# Patient Record
Sex: Male | Born: 1960 | Race: Black or African American | Hispanic: No | Marital: Married | Smoking: Former smoker
Health system: Southern US, Community
[De-identification: ages and names within clinical notes are randomized; demographics above are authoritative.]

## PROBLEM LIST (undated history)

## (undated) DIAGNOSIS — I5043 Acute on chronic combined systolic (congestive) and diastolic (congestive) heart failure: Secondary | ICD-10-CM

## (undated) DIAGNOSIS — I483 Typical atrial flutter: Secondary | ICD-10-CM

## (undated) DIAGNOSIS — E785 Hyperlipidemia, unspecified: Secondary | ICD-10-CM

## (undated) DIAGNOSIS — I10 Essential (primary) hypertension: Secondary | ICD-10-CM

## (undated) DIAGNOSIS — I420 Dilated cardiomyopathy: Secondary | ICD-10-CM

## (undated) DIAGNOSIS — T7840XA Allergy, unspecified, initial encounter: Secondary | ICD-10-CM

## (undated) HISTORY — DX: Dilated cardiomyopathy: I42.0

## (undated) HISTORY — DX: Essential (primary) hypertension: I10

## (undated) HISTORY — DX: Typical atrial flutter: I48.3

## (undated) HISTORY — DX: Acute on chronic combined systolic (congestive) and diastolic (congestive) heart failure: I50.43

## (undated) HISTORY — DX: Allergy, unspecified, initial encounter: T78.40XA

---

## 2003-01-16 HISTORY — PX: THROAT SURGERY: SHX803

## 2007-01-16 HISTORY — PX: KNEE ARTHROSCOPY: SUR90

## 2014-02-10 ENCOUNTER — Ambulatory Visit: Payer: Self-pay | Admitting: Medical

## 2014-02-11 ENCOUNTER — Ambulatory Visit (HOSPITAL_BASED_OUTPATIENT_CLINIC_OR_DEPARTMENT_OTHER)
Admission: RE | Admit: 2014-02-11 | Discharge: 2014-02-11 | Disposition: A | Discharge status: Home / Self Care | Payer: BLUE CROSS/BLUE SHIELD | Source: Ambulatory Visit | Attending: Medical | Admitting: Medical

## 2014-02-11 ENCOUNTER — Other Ambulatory Visit: Payer: Self-pay | Admitting: Medical

## 2014-02-11 ENCOUNTER — Encounter: Payer: Self-pay | Admitting: Medical

## 2014-02-11 ENCOUNTER — Ambulatory Visit (INDEPENDENT_AMBULATORY_CARE_PROVIDER_SITE_OTHER): Payer: BLUE CROSS/BLUE SHIELD | Admitting: Medical

## 2014-02-11 VITALS — BP 139/88 | HR 79 | Temp 98.5°F | Ht 70.5 in | Wt 322.2 lb

## 2014-02-11 DIAGNOSIS — M25562 Pain in left knee: Secondary | ICD-10-CM | POA: Insufficient documentation

## 2014-02-11 DIAGNOSIS — IMO0001 Reserved for inherently not codable concepts without codable children: Secondary | ICD-10-CM | POA: Insufficient documentation

## 2014-02-11 DIAGNOSIS — Z720 Tobacco use: Secondary | ICD-10-CM

## 2014-02-11 DIAGNOSIS — F17200 Nicotine dependence, unspecified, uncomplicated: Secondary | ICD-10-CM | POA: Insufficient documentation

## 2014-02-11 DIAGNOSIS — R030 Elevated blood-pressure reading, without diagnosis of hypertension: Secondary | ICD-10-CM

## 2014-02-11 DIAGNOSIS — J302 Other seasonal allergic rhinitis: Secondary | ICD-10-CM

## 2014-02-11 HISTORY — DX: Other seasonal allergic rhinitis: J30.2

## 2014-02-11 HISTORY — DX: Pain in left knee: M25.562

## 2014-02-11 MED ORDER — TRAMADOL HCL 50 MG PO TABS
50.0000 mg | ORAL_TABLET | Freq: Four times a day (QID) | ORAL | Status: DC | PRN
Start: 2014-02-11 — End: 2015-02-16

## 2014-02-11 NOTE — Assessment & Plan Note (Signed)
Spring and takes otc meds.Marland Kitchen

## 2014-02-11 NOTE — Assessment & Plan Note (Signed)
For you smoking, I want you to think about wellbutrin med we discussed. I would like to see what your bp trend is before starting this medication.

## 2014-02-11 NOTE — Progress Notes (Signed)
Pre visit review using our clinic review tool, if applicable. No additional management support is needed unless otherwise documented below in the visit note. 

## 2014-02-11 NOTE — Progress Notes (Signed)
Subjective:    Patient ID: Drew [name], male    DOB: Dec 19, 1960, 54 y.o.   MRN: [id number]  HPI   I have reviewed pt PMH, PSH, FH, Social History and Surgical History  Allergies- seasonal. Spring is worse. otc meds only.  Lt knee pain- Hx of arthroscopy. With cold weather his left knee hurts worse. But baseline some pain daily.  Pt had polyps removed from larynx. In 2005.  Pt not aware of parents health hx. Appear heatlhy.  Postal service employee, smokes, some alcohol, Eats dinner one time a day(eats at 1pm), married 1 child.   Pt has not had physical for more than one year.   He wants to quit smoking. He quit before in the past 8-9 yrs ago. Stopped for a month and half. Pt used otc gum and nicotine inhalers.    Past Medical History  Diagnosis Date  . Allergy     History   Social History  . Marital Status: Married    Spouse Name: N/A    Number of Children: N/A  . Years of Education: N/A   Occupational History  . Not on file.   Social History Main Topics  . Smoking status: Current Every Day Smoker -- 1.00 packs/day for 20 years    Types: Cigarettes  . Smokeless tobacco: Never Used  . Alcohol Use: 0.0 oz/week    0 Not specified per week     Comment: 6 pack a week spread out over weekend  . Drug Use: No  . Sexual Activity: Yes   Other Topics Concern  . Not on file   Social History Narrative  . No narrative on file    Past Surgical History  Procedure Laterality Date  . Throat surgery  2005  . Knee arthroscopy  2009    History reviewed. No pertinent family history.  Allergies  Allergen Reactions  . Tetracyclines & Related     No current outpatient prescriptions on file prior to visit.   No current facility-administered medications on file prior to visit.    BP 139/88 mmHg  Pulse 79  Temp(Src) 98.5 F (36.9 C) (Oral)  Ht 5' 10.5" (1.791 m)  Wt 322 lb 3.2 oz (146.149 kg)  BMI 45.56 kg/m2  SpO2 94%        Review of Systems    Constitutional: Negative for fever, chills, diaphoresis, activity change and fatigue.  Respiratory: Negative for cough, chest tightness and shortness of breath.   Cardiovascular: Negative for chest pain, palpitations and leg swelling.  Gastrointestinal: Negative for nausea, vomiting and abdominal pain.  Musculoskeletal: Negative for neck pain and neck stiffness.       Lt knee pain.  Neurological: Negative for dizziness, tremors, seizures, syncope, facial asymmetry, speech difficulty, weakness, light-headedness, numbness and headaches.  Psychiatric/Behavioral: Negative for behavioral problems, confusion and agitation. The patient is not nervous/anxious.        Objective:   Physical Exam   General Mental Status- Alert. General Appearance- Not in acute distress.   Skin General: Color- Normal Color. Moisture- Normal Moisture.  Neck Carotid Arteries- Normal color. Moisture- Normal Moisture. No carotid bruits. No JVD.  Chest and Lung Exam Auscultation: Breath Sounds:-Normal.  Cardiovascular Auscultation:Rythm- Regular. Murmurs & Other Heart Sounds:Auscultation of the heart reveals- No Murmurs.  Abdomen Inspection:-Inspeection Normal. Palpation/Percussion:Note:No mass. Palpation and Percussion of the abdomen reveal- Non Tender, Non Distended + BS, no rebound or guarding.    Neurologic Cranial Nerve exam:- CN III-XII intact(No nystagmus), symmetric smile. Drift  Test:- No drift. Romberg Exam:- Negative.  Heal to Toe Gait exam:-Normal. Finger to Nose:- Normal/Intact Strength:- 5/5 equal and symmetric strength both upper and lower extremities.  Lt knee- on flexion and extension he has moderat crepitus.        Assessment & Plan:

## 2014-02-11 NOTE — Patient Instructions (Addendum)
I will get xray of your left knee today. Take tylenol for mild pain. I wil rx tramadol for more severe pain.  For your blood pressure. Follow dash diet. Recheck bp in 2 wks.  For you smoking, I want you to think about wellbutrin med we discussed. I would like to see what your bp trend is before starting this medication.  Follow up in 2 weeks or as needed.  DASH Eating Plan DASH stands for "Dietary Approaches to Stop Hypertension." The DASH eating plan is a healthy eating plan that has been shown to reduce high blood pressure (hypertension). Additional health benefits may include reducing the risk of type 2 diabetes mellitus, heart disease, and stroke. The DASH eating plan may also help with weight loss. WHAT DO I NEED TO KNOW ABOUT THE DASH EATING PLAN? For the DASH eating plan, you will follow these general guidelines:  Choose foods with a percent daily value for sodium of less than 5% (as listed on the food label).  Use salt-free seasonings or herbs instead of table salt or sea salt.  Check with your health care provider or pharmacist before using salt substitutes.  Eat lower-sodium products, often labeled as "lower sodium" or "no salt added."  Eat fresh foods.  Eat more vegetables, fruits, and low-fat dairy products.  Choose whole grains. Look for the word "whole" as the first word in the ingredient list.  Choose fish and skinless chicken or [place] more often than red meat. Limit fish, poultry, and meat to 6 oz (170 g) each day.  Limit sweets, desserts, sugars, and sugary drinks.  Choose heart-healthy fats.  Limit cheese to 1 oz (28 g) per day.  Eat more home-cooked food and less restaurant, buffet, and fast food.  Limit fried foods.  Cook foods using methods other than frying.  Limit canned vegetables. If you do use them, rinse them well to decrease the sodium.  When eating at a restaurant, ask that your food be prepared with less salt, or no salt if possible. WHAT  FOODS CAN I EAT? Seek help from a dietitian for individual calorie needs. Grains Whole grain or whole wheat bread. Brown rice. Whole grain or whole wheat pasta. Quinoa, bulgur, and whole grain cereals. Low-sodium cereals. Corn or whole wheat flour tortillas. Whole grain cornbread. Whole grain crackers. Low-sodium crackers. Vegetables Fresh or frozen vegetables (raw, steamed, roasted, or grilled). Low-sodium or reduced-sodium tomato and vegetable juices. Low-sodium or reduced-sodium tomato sauce and paste. Low-sodium or reduced-sodium canned vegetables.  Fruits All fresh, canned (in natural juice), or frozen fruits. Meat and Other Protein Products Ground beef (85% or leaner), grass-fed beef, or beef trimmed of fat. Skinless chicken or [place]. Ground chicken or [place]. Pork trimmed of fat. All fish and seafood. Eggs. Dried beans, peas, or lentils. Unsalted nuts and seeds. Unsalted canned beans. Dairy Low-fat dairy products, such as skim or 1% milk, 2% or reduced-fat cheeses, low-fat ricotta or cottage cheese, or plain low-fat yogurt. Low-sodium or reduced-sodium cheeses. Fats and Oils Tub margarines without trans fats. Light or reduced-fat mayonnaise and salad dressings (reduced sodium). Avocado. Safflower, olive, or canola oils. Natural peanut or almond butter. Other Unsalted popcorn and pretzels. The items listed above may not be a complete list of recommended foods or beverages. Contact your dietitian for more options. WHAT FOODS ARE NOT RECOMMENDED? Grains White bread. White pasta. White rice. Refined cornbread. Bagels and croissants. Crackers that contain trans fat. Vegetables Creamed or fried vegetables. Vegetables in a cheese sauce. Regular  canned vegetables. Regular canned tomato sauce and paste. Regular tomato and vegetable juices. Fruits Dried fruits. Canned fruit in light or heavy syrup. Fruit juice. Meat and Other Protein Products Fatty cuts of meat. Ribs, chicken wings, bacon,  sausage, bologna, salami, chitterlings, fatback, hot dogs, bratwurst, and packaged luncheon meats. Salted nuts and seeds. Canned beans with salt. Dairy Whole or 2% milk, cream, half-and-half, and cream cheese. Whole-fat or sweetened yogurt. Full-fat cheeses or blue cheese. Nondairy creamers and whipped toppings. Processed cheese, cheese spreads, or cheese curds. Condiments Onion and garlic salt, seasoned salt, table salt, and sea salt. Canned and packaged gravies. Worcestershire sauce. Tartar sauce. Barbecue sauce. Teriyaki sauce. Soy sauce, including reduced sodium. Steak sauce. Fish sauce. Oyster sauce. Cocktail sauce. Horseradish. Ketchup and mustard. Meat flavorings and tenderizers. Bouillon cubes. Hot sauce. Tabasco sauce. Marinades. Taco seasonings. Relishes. Fats and Oils Butter, stick margarine, lard, shortening, ghee, and bacon fat. Coconut, palm kernel, or palm oils. Regular salad dressings. Other Pickles and olives. Salted popcorn and pretzels. The items listed above may not be a complete list of foods and beverages to avoid. Contact your dietitian for more information. WHERE CAN I FIND MORE INFORMATION? National Heart, [employer], and [employer] Institute: [employer] Document Released: 12/21/2010 Document Revised: 05/18/2013 Document Reviewed: 11/05/2012 [facility] Patient Information 2015 [place], [place]. This information is not intended to replace advice given to you by your health care provider. Make sure you discuss any questions you have with your health care provider.

## 2014-02-11 NOTE — Assessment & Plan Note (Signed)
I will get xray of your left knee today. Take tylenol for mild pain. I wil rx tramadol for more severe pain

## 2014-02-11 NOTE — Assessment & Plan Note (Signed)
For your blood pressure. Follow dash diet. Recheck bp in 2 wks.

## 2014-02-15 ENCOUNTER — Telehealth: Payer: Self-pay | Admitting: Medical

## 2014-02-15 DIAGNOSIS — M25562 Pain in left knee: Secondary | ICD-10-CM

## 2014-02-15 NOTE — Telephone Encounter (Signed)
Caller name:Olover desire Relation to GM:[id number] Call back number:[phone] Pharmacy:  Reason for call: pt is returning your call, wife states to please call back. Pt is still in a lot of pain.

## 2014-02-17 MED ORDER — HYDROCODONE-ACETAMINOPHEN 5-325 MG PO TABS: 1.0000 | ORAL_TABLET | Freq: Four times a day (QID) | ORAL | Status: DC | PRN

## 2014-02-17 NOTE — Telephone Encounter (Signed)
Pt has some moderate to severe pain despite use of tramadol. So advised stopped tramadol and pick up hydrocodone. Will refer pt ortho.

## 2014-02-26 ENCOUNTER — Encounter: Payer: Self-pay | Admitting: Medical

## 2014-02-26 ENCOUNTER — Telehealth: Payer: Self-pay | Admitting: Medical

## 2014-02-26 ENCOUNTER — Ambulatory Visit (INDEPENDENT_AMBULATORY_CARE_PROVIDER_SITE_OTHER): Payer: BLUE CROSS/BLUE SHIELD | Admitting: Medical

## 2014-02-26 VITALS — BP 130/80 | HR 72 | Temp 98.7°F | Ht 70.5 in | Wt 317.0 lb

## 2014-02-26 DIAGNOSIS — Z0189 Encounter for other specified special examinations: Secondary | ICD-10-CM

## 2014-02-26 DIAGNOSIS — Z1211 Encounter for screening for malignant neoplasm of colon: Secondary | ICD-10-CM

## 2014-02-26 DIAGNOSIS — Z23 Encounter for immunization: Secondary | ICD-10-CM

## 2014-02-26 DIAGNOSIS — R82998 Other abnormal findings in urine: Secondary | ICD-10-CM

## 2014-02-26 DIAGNOSIS — Z0000 Encounter for general adult medical examination without abnormal findings: Secondary | ICD-10-CM

## 2014-02-26 DIAGNOSIS — N390 Urinary tract infection, site not specified: Secondary | ICD-10-CM

## 2014-02-26 HISTORY — DX: Encounter for general adult medical examination without abnormal findings: Z00.00

## 2014-02-26 LAB — COMPREHENSIVE METABOLIC PANEL
ALBUMIN: 4.2 g/dL (ref 3.5–5.2)
ALT: 13 U/L (ref 0–53)
AST: 12 U/L (ref 0–37)
Alkaline Phosphatase: 55 U/L (ref 39–117)
BUN: 10 mg/dL (ref 6–23)
CHLORIDE: 107 meq/L (ref 96–112)
CO2: 29 meq/L (ref 19–32)
Calcium: 9.2 mg/dL (ref 8.4–10.5)
Creatinine, Ser: 0.88 mg/dL (ref 0.40–1.50)
GFR: 117.21 mL/min (ref >60.00)
GLUCOSE: 98 mg/dL (ref 70–99)
POTASSIUM: 3.8 meq/L (ref 3.5–5.1)
Sodium: 141 mEq/L (ref 135–145)
TOTAL PROTEIN: 7.1 g/dL (ref 6.0–8.3)
Total Bilirubin: 0.8 mg/dL (ref 0.2–1.2)

## 2014-02-26 LAB — CBC WITH DIFFERENTIAL/PLATELET
Basophils Absolute: 0 10*3/uL (ref 0.0–0.1)
Basophils Relative: 0.5 % (ref 0.0–3.0)
EOS PCT: 4.4 % (ref 0.0–5.0)
Eosinophils Absolute: 0.2 10*3/uL (ref 0.0–0.7)
HEMATOCRIT: 42.9 % (ref 39.0–52.0)
Hemoglobin: 14.5 g/dL (ref 13.0–17.0)
LYMPHS ABS: 1.2 10*3/uL (ref 0.7–4.0)
Lymphocytes Relative: 21.9 % (ref 12.0–46.0)
MCHC: 33.8 g/dL (ref 30.0–36.0)
MCV: 92.8 fl (ref 78.0–100.0)
MONO ABS: 0.3 10*3/uL (ref 0.1–1.0)
Monocytes Relative: 5.6 % (ref 3.0–12.0)
Neutro Abs: 3.7 10*3/uL (ref 1.4–7.7)
Neutrophils Relative %: 67.6 % (ref 43.0–77.0)
Platelets: 218 10*3/uL (ref 150.0–400.0)
RBC: 4.63 Mil/uL (ref 4.22–5.81)
RDW: 14.1 % (ref 11.5–15.5)
WBC: 5.5 10*3/uL (ref 4.0–10.5)

## 2014-02-26 LAB — LIPID PANEL
CHOLESTEROL: 189 mg/dL (ref 0–200)
HDL: 42.3 mg/dL (ref >39.00)
LDL Cholesterol: 130 mg/dL — ABNORMAL HIGH (ref 0–99)
NonHDL: 146.7
Total CHOL/HDL Ratio: 4
Triglycerides: 84 mg/dL (ref 0.0–149.0)
VLDL: 16.8 mg/dL (ref 0.0–40.0)

## 2014-02-26 LAB — POCT URINALYSIS DIPSTICK
GLUCOSE UA: NEGATIVE
Ketones, UA: NEGATIVE
NITRITE UA: NEGATIVE
Protein, UA: 300
Spec Grav, UA: 1.025
Urobilinogen, UA: 0.2
pH, UA: 5

## 2014-02-26 LAB — TSH: TSH: 2.3 u[IU]/mL (ref 0.35–4.50)

## 2014-02-26 LAB — PSA: PSA: 0.31 ng/mL (ref 0.10–4.00)

## 2014-02-26 MED ORDER — HYDROCORTISONE ACETATE 25 MG RE SUPP: 25.0000 mg | Freq: Two times a day (BID) | RECTAL | Status: DC

## 2014-02-26 NOTE — Progress Notes (Signed)
Pre visit review using our clinic review tool, if applicable. No additional management support is needed unless otherwise documented below in the visit note. 

## 2014-02-26 NOTE — Assessment & Plan Note (Signed)
Tdap, fasting cbc, cmp, tsh, lipid and psa.

## 2014-02-26 NOTE — Patient Instructions (Addendum)
We gave you a tdap today. And you declined the flu vaccine. If you change you mind on the flu vaccine let us know.  I will refer you to GI today for screening colonosocpy.  Recommend to stop smoking.  Please get labs today. We will call you on the labs.  Follow up in 3-4 weeks or as needed.    Preventive Care for Adults A healthy lifestyle and preventive care can promote health and wellness. Preventive health guidelines for men include the following key practices:  A routine yearly physical is a good way to check with your health care provider about your health and preventative screening. It is a chance to share any concerns and updates on your health and to receive a thorough exam.  Visit your dentist for a routine exam and preventative care every 6 months. Brush your teeth twice a day and floss once a day. Good oral hygiene prevents tooth decay and gum disease.  The frequency of eye exams is based on your age, health, family medical history, use of contact lenses, and other factors. Follow your health care provider's recommendations for frequency of eye exams.  Eat a healthy diet. Foods such as vegetables, fruits, whole grains, low-fat dairy products, and lean protein foods contain the nutrients you need without too many calories. Decrease your intake of foods high in solid fats, added sugars, and salt. Eat the right amount of calories for you.Get information about a proper diet from your health care provider, if necessary.  Regular physical exercise is one of the most important things you can do for your health. Most adults should get at least 150 minutes of moderate-intensity exercise (any activity that increases your heart rate and causes you to sweat) each week. In addition, most adults need muscle-strengthening exercises on 2 or more days a week.  Maintain a healthy weight. The body mass index (BMI) is a screening tool to identify possible weight problems. It provides an estimate of  body fat based on height and weight. Your health care provider can find your BMI and can help you achieve or maintain a healthy weight.For adults 20 years and older:  A BMI below 18.5 is considered underweight.  A BMI of 18.5 to 24.9 is normal.  A BMI of 25 to 29.9 is considered overweight.  A BMI of 30 and above is considered obese.  Maintain normal blood lipids and cholesterol levels by exercising and minimizing your intake of saturated fat. Eat a balanced diet with plenty of fruit and vegetables. Blood tests for lipids and cholesterol should begin at age 18 and be repeated every 5 years. If your lipid or cholesterol levels are high, you are over 50, or you are at high risk for heart disease, you may need your cholesterol levels checked more frequently.Ongoing high lipid and cholesterol levels should be treated with medicines if diet and exercise are not working.  If you smoke, find out from your health care provider how to quit. If you do not use tobacco, do not start.  Lung cancer screening is recommended for adults aged 63-80 years who are at high risk for developing lung cancer because of a history of smoking. A yearly low-dose CT scan of the lungs is recommended for people who have at least a 30-pack-year history of smoking and are a current smoker or have quit within the past 15 years. A pack year of smoking is smoking an average of 1 pack of cigarettes a day for 1 year (for  example: 1 pack a day for 30 years or 2 packs a day for 15 years). Yearly screening should continue until the smoker has stopped smoking for at least 15 years. Yearly screening should be stopped for people who develop a health problem that would prevent them from having lung cancer treatment.  If you choose to drink alcohol, do not have more than 2 drinks per day. One drink is considered to be 12 ounces (355 mL) of beer, 5 ounces (148 mL) of wine, or 1.5 ounces (44 mL) of liquor.  Avoid use of street drugs. Do not  share needles with anyone. Ask for help if you need support or instructions about stopping the use of drugs.  High blood pressure causes heart disease and increases the risk of stroke. Your blood pressure should be checked at least every 1-2 years. Ongoing high blood pressure should be treated with medicines, if weight loss and exercise are not effective.  If you are 74-45 years old, ask your health care provider if you should take aspirin to prevent heart disease.  Diabetes screening involves taking a blood sample to check your fasting blood sugar level. This should be done once every 3 years, after age 42, if you are within normal weight and without risk factors for diabetes. Testing should be considered at a younger age or be carried out more frequently if you are overweight and have at least 1 risk factor for diabetes.  Colorectal cancer can be detected and often prevented. Most routine colorectal cancer screening begins at the age of 37 and continues through age 18. However, your health care provider may recommend screening at an earlier age if you have risk factors for colon cancer. On a yearly basis, your health care provider may provide home test kits to check for hidden blood in the stool. Use of a small camera at the end of a tube to directly examine the colon (sigmoidoscopy or colonoscopy) can detect the earliest forms of colorectal cancer. Talk to your health care provider about this at age 60, when routine screening begins. Direct exam of the colon should be repeated every 5-10 years through age 40, unless early forms of precancerous polyps or small growths are found.  People who are at an increased risk for hepatitis B should be screened for this virus. You are considered at high risk for hepatitis B if:  You were born in a country where hepatitis B occurs often. Talk with your health care provider about which countries are considered high risk.  Your parents were born in a high-risk  country and you have not received a shot to protect against hepatitis B (hepatitis B vaccine).  You have HIV or AIDS.  You use needles to inject street drugs.  You live with, or have sex with, someone who has hepatitis B.  You are a man who has sex with other men (MSM).  You get hemodialysis treatment.  You take certain medicines for conditions such as cancer, organ transplantation, and autoimmune conditions.  Hepatitis C blood testing is recommended for all people born from [birth year] through 1965 and any individual with known risks for hepatitis C.  Practice safe sex. Use condoms and avoid high-risk sexual practices to reduce the spread of sexually transmitted infections (STIs). STIs include gonorrhea, chlamydia, syphilis, trichomonas, herpes, HPV, and human immunodeficiency virus (HIV). Herpes, HIV, and HPV are viral illnesses that have no cure. They can result in disability, cancer, and death.  If you are at risk of  being infected with HIV, it is recommended that you take a prescription medicine daily to prevent HIV infection. This is called preexposure prophylaxis (PrEP). You are considered at risk if:  You are a man who has sex with other men (MSM) and have other risk factors.  You are a heterosexual man, are sexually active, and are at increased risk for HIV infection.  You take drugs by injection.  You are sexually active with a partner who has HIV.  Talk with your health care provider about whether you are at high risk of being infected with HIV. If you choose to begin PrEP, you should first be tested for HIV. You should then be tested every 3 months for as long as you are taking PrEP.  A one-time screening for abdominal aortic aneurysm (AAA) and surgical repair of large AAAs by ultrasound are recommended for men ages 71 to 45 years who are current or former smokers.  Healthy men should no longer receive prostate-specific antigen (PSA) blood tests as part of routine cancer  screening. Talk with your health care provider about prostate cancer screening.  Testicular cancer screening is not recommended for adult males who have no symptoms. Screening includes self-exam, a health care provider exam, and other screening tests. Consult with your health care provider about any symptoms you have or any concerns you have about testicular cancer.  Use sunscreen. Apply sunscreen liberally and repeatedly throughout the day. You should seek shade when your shadow is shorter than you. Protect yourself by wearing long sleeves, pants, a wide-brimmed hat, and sunglasses year round, whenever you are outdoors.  Once a month, do a whole-body skin exam, using a mirror to look at the skin on your back. Tell your health care provider about new moles, moles that have irregular borders, moles that are larger than a pencil eraser, or moles that have changed in shape or color.  Stay current with required vaccines (immunizations).  Influenza vaccine. All adults should be immunized every year.  Tetanus, diphtheria, and acellular pertussis (Td, Tdap) vaccine. An adult who has not previously received Tdap or who does not know his vaccine status should receive 1 dose of Tdap. This initial dose should be followed by tetanus and diphtheria toxoids (Td) booster doses every 10 years. Adults with an unknown or incomplete history of completing a 3-dose immunization series with Td-containing vaccines should begin or complete a primary immunization series including a Tdap dose. Adults should receive a Td booster every 10 years.  Varicella vaccine. An adult without evidence of immunity to varicella should receive 2 doses or a second dose if he has previously received 1 dose.  Human papillomavirus (HPV) vaccine. Males aged 65-21 years who have not received the vaccine previously should receive the 3-dose series. Males aged 22-26 years may be immunized. Immunization is recommended through the age of 56 years for  any male who has sex with males and did not get any or all doses earlier. Immunization is recommended for any person with an immunocompromised condition through the age of 30 years if he did not get any or all doses earlier. During the 3-dose series, the second dose should be obtained 4-8 weeks after the first dose. The third dose should be obtained 24 weeks after the first dose and 16 weeks after the second dose.  Zoster vaccine. One dose is recommended for adults aged 52 years or older unless certain conditions are present.  Measles, mumps, and rubella (MMR) vaccine. Adults born before [birth year] generally  are considered immune to measles and mumps. Adults born in [birth year] or later should have 1 or more doses of MMR vaccine unless there is a contraindication to the vaccine or there is laboratory evidence of immunity to each of the three diseases. A routine second dose of MMR vaccine should be obtained at least 28 days after the first dose for students attending postsecondary schools, health care workers, or international travelers. People who received inactivated measles vaccine or an unknown type of measles vaccine during 1963-1967 should receive 2 doses of MMR vaccine. People who received inactivated mumps vaccine or an unknown type of mumps vaccine before 1979 and are at high risk for mumps infection should consider immunization with 2 doses of MMR vaccine. Unvaccinated health care workers born before [birth year] who lack laboratory evidence of measles, mumps, or rubella immunity or laboratory confirmation of disease should consider measles and mumps immunization with 2 doses of MMR vaccine or rubella immunization with 1 dose of MMR vaccine.  Pneumococcal 13-valent conjugate (PCV13) vaccine. When indicated, a person who is uncertain of his immunization history and has no record of immunization should receive the PCV13 vaccine. An adult aged 65 years or older who has certain medical conditions and has not been previously  immunized should receive 1 dose of PCV13 vaccine. This PCV13 should be followed with a dose of pneumococcal polysaccharide (PPSV23) vaccine. The PPSV23 vaccine dose should be obtained at least 8 weeks after the dose of PCV13 vaccine. An adult aged 104 years or older who has certain medical conditions and previously received 1 or more doses of PPSV23 vaccine should receive 1 dose of PCV13. The PCV13 vaccine dose should be obtained 1 or more years after the last PPSV23 vaccine dose.  Pneumococcal polysaccharide (PPSV23) vaccine. When PCV13 is also indicated, PCV13 should be obtained first. All adults aged 21 years and older should be immunized. An adult younger than age 48 years who has certain medical conditions should be immunized. Any person who resides in a nursing home or long-term care facility should be immunized. An adult smoker should be immunized. People with an immunocompromised condition and certain other conditions should receive both PCV13 and PPSV23 vaccines. People with human immunodeficiency virus (HIV) infection should be immunized as soon as possible after diagnosis. Immunization during chemotherapy or radiation therapy should be avoided. Routine use of PPSV23 vaccine is not recommended for American Indians, [place] Natives, or people younger than 65 years unless there are medical conditions that require PPSV23 vaccine. When indicated, people who have unknown immunization and have no record of immunization should receive PPSV23 vaccine. One-time revaccination 5 years after the first dose of PPSV23 is recommended for people aged 19-64 years who have chronic kidney failure, nephrotic syndrome, asplenia, or immunocompromised conditions. People who received 1-2 doses of PPSV23 before age 6 years should receive another dose of PPSV23 vaccine at age 55 years or later if at least 5 years have passed since the previous dose. Doses of PPSV23 are not needed for people immunized with PPSV23 at or after age 71  years.  Meningococcal vaccine. Adults with asplenia or persistent complement component deficiencies should receive 2 doses of quadrivalent meningococcal conjugate (MenACWY-D) vaccine. The doses should be obtained at least 2 months apart. Microbiologists working with certain meningococcal bacteria, [employer] recruits, people at risk during an outbreak, and people who travel to or live in countries with a high rate of meningitis should be immunized. A first-year college student up through age 107 years who is  living in a residence hall should receive a dose if he did not receive a dose on or after his 16th birthday. Adults who have certain high-risk conditions should receive one or more doses of vaccine.  Hepatitis A vaccine. Adults who wish to be protected from this disease, have certain high-risk conditions, work with hepatitis A-infected animals, work in hepatitis A research labs, or travel to or work in countries with a high rate of hepatitis A should be immunized. Adults who were previously unvaccinated and who anticipate close contact with an international adoptee during the first 60 days after arrival in the [place] from a country with a high rate of hepatitis A should be immunized.  Hepatitis B vaccine. Adults should be immunized if they wish to be protected from this disease, have certain high-risk conditions, may be exposed to blood or other infectious body fluids, are household contacts or sex partners of hepatitis B positive people, are clients or workers in certain care facilities, or travel to or work in countries with a high rate of hepatitis B.  Haemophilus influenzae type b (Hib) vaccine. A previously unvaccinated person with asplenia or sickle cell disease or having a scheduled splenectomy should receive 1 dose of Hib vaccine. Regardless of previous immunization, a recipient of a hematopoietic stem cell transplant should receive a 3-dose series 6-12 months after his successful transplant.  Hib vaccine is not recommended for adults with HIV infection. Preventive Service / Frequency Ages 14 to 40  Blood pressure check.** / Every 1 to 2 years.  Lipid and cholesterol check.** / Every 5 years beginning at age 63.  Hepatitis C blood test.** / For any individual with known risks for hepatitis C.  Skin self-exam. / Monthly.  Influenza vaccine. / Every year.  Tetanus, diphtheria, and acellular pertussis (Tdap, Td) vaccine.** / Consult your health care provider. 1 dose of Td every 10 years.  Varicella vaccine.** / Consult your health care provider.  HPV vaccine. / 3 doses over 6 months, if 54 or younger.  Measles, mumps, rubella (MMR) vaccine.** / You need at least 1 dose of MMR if you were born in 1957 or later. You may also need a second dose.  Pneumococcal 13-valent conjugate (PCV13) vaccine.** / Consult your health care provider.  Pneumococcal polysaccharide (PPSV23) vaccine.** / 1 to 2 doses if you smoke cigarettes or if you have certain conditions.  Meningococcal vaccine.** / 1 dose if you are age 15 to 36 years and a Market researcher living in a residence hall, or have one of several medical conditions. You may also need additional booster doses.  Hepatitis A vaccine.** / Consult your health care provider.  Hepatitis B vaccine.** / Consult your health care provider.  Haemophilus influenzae type b (Hib) vaccine.** / Consult your health care provider. Ages 68 to 42  Blood pressure check.** / Every 1 to 2 years.  Lipid and cholesterol check.** / Every 5 years beginning at age 58.  Lung cancer screening. / Every year if you are aged 74-80 years and have a 30-pack-year history of smoking and currently smoke or have quit within the past 15 years. Yearly screening is stopped once you have quit smoking for at least 15 years or develop a health problem that would prevent you from having lung cancer treatment.  Fecal occult blood test (FOBT) of stool. / Every year  beginning at age 13 and continuing until age 65. You may not have to do this test if you get a colonoscopy  every 10 years.  Flexible sigmoidoscopy** or colonoscopy.** / Every 5 years for a flexible sigmoidoscopy or every 10 years for a colonoscopy beginning at age 66 and continuing until age 43.  Hepatitis C blood test.** / For all people born from [birth year] through 1965 and any individual with known risks for hepatitis C.  Skin self-exam. / Monthly.  Influenza vaccine. / Every year.  Tetanus, diphtheria, and acellular pertussis (Tdap/Td) vaccine.** / Consult your health care provider. 1 dose of Td every 10 years.  Varicella vaccine.** / Consult your health care provider.  Zoster vaccine.** / 1 dose for adults aged 46 years or older.  Measles, mumps, rubella (MMR) vaccine.** / You need at least 1 dose of MMR if you were born in 1955 or later. You may also need a second dose.  Pneumococcal 13-valent conjugate (PCV13) vaccine.** / Consult your health care provider.  Pneumococcal polysaccharide (PPSV23) vaccine.** / 1 to 2 doses if you smoke cigarettes or if you have certain conditions.  Meningococcal vaccine.** / Consult your health care provider.  Hepatitis A vaccine.** / Consult your health care provider.  Hepatitis B vaccine.** / Consult your health care provider.  Haemophilus influenzae type b (Hib) vaccine.** / Consult your health care provider. Ages 27 and over  Blood pressure check.** / Every 1 to 2 years.  Lipid and cholesterol check.**/ Every 5 years beginning at age 70.  Lung cancer screening. / Every year if you are aged 29-80 years and have a 30-pack-year history of smoking and currently smoke or have quit within the past 15 years. Yearly screening is stopped once you have quit smoking for at least 15 years or develop a health problem that would prevent you from having lung cancer treatment.  Fecal occult blood test (FOBT) of stool. / Every year beginning at age 78 and  continuing until age 48. You may not have to do this test if you get a colonoscopy every 10 years.  Flexible sigmoidoscopy** or colonoscopy.** / Every 5 years for a flexible sigmoidoscopy or every 10 years for a colonoscopy beginning at age 97 and continuing until age 42.  Hepatitis C blood test.** / For all people born from [birth year] through 1963 and any individual with known risks for hepatitis C.  Abdominal aortic aneurysm (AAA) screening.** / A one-time screening for ages 77 to 29 years who are current or former smokers.  Skin self-exam. / Monthly.  Influenza vaccine. / Every year.  Tetanus, diphtheria, and acellular pertussis (Tdap/Td) vaccine.** / 1 dose of Td every 10 years.  Varicella vaccine.** / Consult your health care provider.  Zoster vaccine.** / 1 dose for adults aged 54 years or older.  Pneumococcal 13-valent conjugate (PCV13) vaccine.** / Consult your health care provider.  Pneumococcal polysaccharide (PPSV23) vaccine.** / 1 dose for all adults aged 68 years and older.  Meningococcal vaccine.** / Consult your health care provider.  Hepatitis A vaccine.** / Consult your health care provider.  Hepatitis B vaccine.** / Consult your health care provider.  Haemophilus influenzae type b (Hib) vaccine.** / Consult your health care provider. **Family history and personal history of risk and conditions may change your health care provider's recommendations. Document Released: 02/27/2001 Document Revised: 01/06/2013 Document Reviewed: 05/29/2010 [facility] Patient Information 2015 [place], [place]. This information is not intended to replace advice given to you by your health care provider. Make sure you discuss any questions you have with your health care provider.

## 2014-02-26 NOTE — Telephone Encounter (Signed)
Annusol hc suppository. Will send to his pharmacy.

## 2014-02-26 NOTE — Progress Notes (Signed)
Subjective:    Patient ID: Drew [name], male    DOB: [birth year], 54 y.o.   MRN: [id number]  HPI   Pt in for physical exam.  I have reviewed pt PMH, PSH, FH, Social History and Surgical History  Pt eats one time a day(moderate healthy), No exercise, current smoker, married- 6 children.  Pt never had colonosocpy. No fh for colon cancer.  Pt declines flu vaccine. Decline pneumonia vaccine.  Pt states no recent tetanus done for years. Pt willing to do tdap.     Review of Systems  Constitutional: Negative for fever, chills and fatigue.  HENT: Negative for congestion, drooling, facial swelling, hearing loss, postnasal drip, sinus pressure, sneezing and sore throat.   Respiratory: Negative for cough, choking, chest tightness, shortness of breath and wheezing.   Cardiovascular: Negative for chest pain and palpitations.  Gastrointestinal: Negative for abdominal pain, diarrhea and constipation.       3 days of itchng burning flared hemorrhoid.  Genitourinary: Negative for dysuria, hematuria, flank pain, scrotal swelling, enuresis and testicular pain.  Musculoskeletal: Negative for myalgias and back pain.  Neurological: Negative for dizziness, syncope, facial asymmetry, speech difficulty, weakness, numbness and headaches.  Psychiatric/Behavioral: Negative for behavioral problems and confusion.     Past Medical History  Diagnosis Date  . Allergy     History   Social History  . Marital Status: Married    Spouse Name: N/A  . Number of Children: N/A  . Years of Education: N/A   Occupational History  . Not on file.   Social History Main Topics  . Smoking status: Current Every Day Smoker -- 1.00 packs/day for 20 years    Types: Cigarettes  . Smokeless tobacco: Never Used  . Alcohol Use: 0.0 oz/week    0 Standard drinks or equivalent per week     Comment: 6 pack a week spread out over weekend  . Drug Use: No  . Sexual Activity: Yes   Other Topics Concern  . Not on  file   Social History Narrative    Past Surgical History  Procedure Laterality Date  . Throat surgery  2005  . Knee arthroscopy  2009    No family history on file.  Allergies  Allergen Reactions  . Tetracyclines & Related     Current Outpatient Prescriptions on File Prior to Visit  Medication Sig Dispense Refill  . HYDROcodone-acetaminophen (NORCO) 5-325 MG per tablet Take 1 tablet by mouth every 6 (six) hours as needed for moderate pain. 30 tablet 0  . traMADol (ULTRAM) 50 MG tablet Take 1 tablet (50 mg total) by mouth every 6 (six) hours as needed. 30 tablet 0   No current facility-administered medications on file prior to visit.    BP 130/80 mmHg  Pulse 72  Temp(Src) 98.7 F (37.1 C) (Oral)  Ht 5' 10.5" (1.791 m)  Wt 317 lb (143.79 kg)  BMI 44.83 kg/m2  SpO2 97%      Objective:   Physical Exam  General Mental Status- Alert. Orientation- Oriented x3.  Build and Nutrition- Well nourished and Well Developed.  Skin General:-Normal. Color- Normal color. Moisture- Normal. Temperature-Warm.  HEENT  Ears- Normal. Auditory Canal- Bilateral-Normal. Tympanic Membrane- Bilateral-Normal. Eye Fundi-Bilateral-Normal. Pupil- bilateral- Direct reaction to light normal. Nose & Sinuses- Normal. Nostrils-Bilateral- Normal. Mouth & Throat-Normal.  Neck Neck- No Bruits or Masses. Trachea midline.  Thyroid- Normal.  Chest and Lung Exam Percussion: Quality and Intensity-Percussion normal. Percussion of the chest reveals- No Dullness.  Palpation:  Palpation of the chest reveals- Non-tender- No dullness. Auscultation: Breath Sounds- Normal.  Adventitous Sounds:-No adventitious sounds.  Cardiovascular Inspection:- No Heaves. Auscultation:-Normal sinus rhythm without murmur gallop, S1 WNL and S2 WNL.  Abdomen Inspection:-Inspection Normal. Inspection of the abdomen reveals- No hernias Palpation/Percussion:- Palpation and Percussion of the Abdomen reveal- Non Tender and  No Palpable abdominal masses. Liver: Other Characteristics- No hepatomegaly. Spleen:Other Characteristics- No Splenomegaly. Auscultation:- Auscultation of the abdomen reveals- Bowel sounds normal and No Abdominal bruits.  Male Genitourinary Urethra:- No discharge. Penis- Circumcised. Scrotum- No masses. Testes- Bilateral-Normal.  Rectal Anorectal Exam: Performed- Normal sphincter tone. No masses noted. Prostate smooth normal size. Has large hemorrhoid 3 oclock. About 2 cm x 1 cm.  Peripheral  Vascular Lower Extremity:Inspection- Bilateral-Inspection Normal  Palpation: Femoral pulse- Bilateral- 2+. Popliteal pulse- Bilateral-2+. Dorsalis pedis pulse- Bilateral- 1/2+. Edema- Bilateral- No edema.  Neurologic Mental Status:- Normal. Cranial Nerves:-Normal Bilaterally. Motor:-Normal. Strength:5/5 normal muscle strength-All Muscles. General Assessment of Reflexes: Right Knee-2+. Left Knee- 2+. Coordination-Normal. Gait- Normal.  Meningeal Signs- None.  Musculoskeletal Global Assessment General-Joints show full range of motion without obvious deformity and Normal muscle mass. Strength in upper and lower extremities.  Lymphatics General lymphatics Description- No generalized lymphadenopathy.      Assessment & Plan:

## 2014-02-27 LAB — URINE CULTURE: Colony Count: 70000

## 2014-03-01 ENCOUNTER — Telehealth: Payer: Self-pay | Admitting: Medical

## 2014-03-01 NOTE — Telephone Encounter (Signed)
emmi mailed  °

## 2014-03-02 ENCOUNTER — Other Ambulatory Visit: Payer: Self-pay | Admitting: Medical

## 2014-03-02 MED ORDER — AMOXICILLIN 500 MG PO CAPS: 500.0000 mg | ORAL_CAPSULE | Freq: Three times a day (TID) | ORAL | Status: DC

## 2015-02-02 ENCOUNTER — Encounter: Payer: Self-pay | Admitting: Medical

## 2015-02-02 ENCOUNTER — Ambulatory Visit (INDEPENDENT_AMBULATORY_CARE_PROVIDER_SITE_OTHER): Payer: Federal, State, Local not specified - PPO | Admitting: Medical

## 2015-02-02 ENCOUNTER — Other Ambulatory Visit (HOSPITAL_COMMUNITY)
Admission: RE | Admit: 2015-02-02 | Discharge: 2015-02-02 | Disposition: A | Discharge status: Home / Self Care | Payer: Federal, State, Local not specified - PPO | Source: Ambulatory Visit | Attending: Medical | Admitting: Medical

## 2015-02-02 VITALS — BP 128/84 | HR 78 | Temp 98.0°F | Ht 70.5 in | Wt 318.0 lb

## 2015-02-02 DIAGNOSIS — Z113 Encounter for screening for infections with a predominantly sexual mode of transmission: Secondary | ICD-10-CM | POA: Diagnosis present

## 2015-02-02 DIAGNOSIS — Z202 Contact with and (suspected) exposure to infections with a predominantly sexual mode of transmission: Secondary | ICD-10-CM | POA: Diagnosis not present

## 2015-02-02 LAB — RPR

## 2015-02-02 LAB — HIV ANTIBODY (ROUTINE TESTING W REFLEX): HIV 1&2 Ab, 4th Generation: NONREACTIVE

## 2015-02-02 MED ORDER — METRONIDAZOLE 500 MG PO TABS: 500.0000 mg | ORAL_TABLET | Freq: Three times a day (TID) | ORAL | Status: DC

## 2015-02-02 MED FILL — metroNIDAZOLE 500 MG TABS: 500 | 10 days supply | Qty: 30 | Fill #0

## 2015-02-02 NOTE — Progress Notes (Signed)
Pre visit review using our clinic review tool, if applicable. No additional management support is needed unless otherwise documented below in the visit note. 

## 2015-02-02 NOTE — Progress Notes (Signed)
   Subjective:    Patient ID: Drew [name], male    DOB: Aug 10, 1960, 55 y.o.   MRN: [id number]  HPI   Pt in for std evaluation.  Pt states his wife diagnosed with trichomonas. Pt admits he is likely the cause(some relations outside marriage when out of town). No testicle pain. No discharge from penis. No skin lesions to penis.    Review of Systems  Constitutional: Negative for fever, chills and fatigue.  Respiratory: Negative for cough, chest tightness, shortness of breath and wheezing.   Cardiovascular: Negative for chest pain and palpitations.  Genitourinary: Negative for dysuria, urgency, frequency, discharge, penile pain and testicular pain.  Musculoskeletal: Negative for back pain.  Neurological: Negative for dizziness, numbness and headaches.  Hematological: Negative for adenopathy. Does not bruise/bleed easily.  Psychiatric/Behavioral: Negative for behavioral problems and confusion.    Past Medical History  Diagnosis Date  . Allergy     Social History   Social History  . Marital Status: Married    Spouse Name: N/A  . Number of Children: N/A  . Years of Education: N/A   Occupational History  . Not on file.   Social History Main Topics  . Smoking status: Current Every Day Smoker -- 1.00 packs/day for 20 years    Types: Cigarettes  . Smokeless tobacco: Never Used  . Alcohol Use: 0.0 oz/week    0 Standard drinks or equivalent per week     Comment: 6 pack a week spread out over weekend  . Drug Use: No  . Sexual Activity: Yes   Other Topics Concern  . Not on file   Social History Narrative    Past Surgical History  Procedure Laterality Date  . Throat surgery  2005  . Knee arthroscopy  2009    No family history on file.  Allergies  Allergen Reactions  . Tetracyclines & Related     Current Outpatient Prescriptions on File Prior to Visit  Medication Sig Dispense Refill  . traMADol (ULTRAM) 50 MG tablet Take 1 tablet (50 mg total) by mouth every 6  (six) hours as needed. (Patient not taking: Reported on 02/02/2015) 30 tablet 0   No current facility-administered medications on file prior to visit.    BP 128/84 mmHg  Pulse 78  Temp(Src) 98 F (36.7 C) (Oral)  Ht 5' 10.5" (1.791 m)  Wt 318 lb (144.244 kg)  BMI 44.97 kg/m2  SpO2 97%       Objective:   Physical Exam  General- No acute distress. Pleasant patient. Neck- Full range of motion, no jvd Lungs- Clear, even and unlabored. Heart- regular rate and rhythm. Neurologic- CNII- XII grossly intact.  Genital- exam differred.       Assessment & Plan:  For trichomonas exposure Will prescribe flagyl today.  Will also get urine ancillary std test. Get screening  lab work(hiv and rpr)  Follow as needed depending on lab review.

## 2015-02-02 NOTE — Patient Instructions (Addendum)
For trichomonas exposure Will prescribe flagyl today.  Will also get urine ancillary std test. Get screening  lab work(hiv and rpr)  Follow as needed depending on lab review.

## 2015-02-03 LAB — URINE CYTOLOGY ANCILLARY ONLY
CHLAMYDIA, DNA PROBE: NEGATIVE
Neisseria Gonorrhea: NEGATIVE
Trichomonas: POSITIVE — AB

## 2015-02-16 ENCOUNTER — Other Ambulatory Visit: Payer: Self-pay

## 2015-02-16 DIAGNOSIS — M25562 Pain in left knee: Secondary | ICD-10-CM

## 2015-02-16 MED ORDER — TRAMADOL HCL 50 MG PO TABS: 50.0000 mg | ORAL_TABLET | Freq: Four times a day (QID) | ORAL | Status: DC | PRN

## 2015-02-17 ENCOUNTER — Telehealth: Payer: Self-pay | Admitting: Medical

## 2015-02-17 NOTE — Telephone Encounter (Signed)
LM for pt to call and schedule flu shot or update records. °

## 2015-03-11 ENCOUNTER — Encounter: Payer: Self-pay | Admitting: Medical

## 2015-03-11 ENCOUNTER — Ambulatory Visit (INDEPENDENT_AMBULATORY_CARE_PROVIDER_SITE_OTHER): Payer: Federal, State, Local not specified - PPO | Admitting: Medical

## 2015-03-11 VITALS — BP 126/82 | HR 77 | Temp 98.1°F | Ht 70.5 in | Wt 310.6 lb

## 2015-03-11 DIAGNOSIS — K529 Noninfective gastroenteritis and colitis, unspecified: Secondary | ICD-10-CM | POA: Diagnosis not present

## 2015-03-11 DIAGNOSIS — R5383 Other fatigue: Secondary | ICD-10-CM

## 2015-03-11 LAB — CBC WITH DIFFERENTIAL/PLATELET
BASOS PCT: 0 % (ref 0–1)
Basophils Absolute: 0 10*3/uL (ref 0.0–0.1)
EOS ABS: 0.2 10*3/uL (ref 0.0–0.7)
EOS PCT: 5 % (ref 0–5)
HCT: 47.3 % (ref 39.0–52.0)
Hemoglobin: 16.1 g/dL (ref 13.0–17.0)
Lymphocytes Relative: 50 % — ABNORMAL HIGH (ref 12–46)
Lymphs Abs: 2.3 10*3/uL (ref 0.7–4.0)
MCH: 30.8 pg (ref 26.0–34.0)
MCHC: 34 g/dL (ref 30.0–36.0)
MCV: 90.4 fL (ref 78.0–100.0)
MONO ABS: 0.5 10*3/uL (ref 0.1–1.0)
MONOS PCT: 12 % (ref 3–12)
MPV: 9.8 fL (ref 8.6–12.4)
Neutro Abs: 1.5 10*3/uL — ABNORMAL LOW (ref 1.7–7.7)
Neutrophils Relative %: 33 % — ABNORMAL LOW (ref 43–77)
PLATELETS: 211 10*3/uL (ref 150–400)
RBC: 5.23 MIL/uL (ref 4.22–5.81)
RDW: 13.6 % (ref 11.5–15.5)
WBC: 4.5 10*3/uL (ref 4.0–10.5)

## 2015-03-11 LAB — COMPREHENSIVE METABOLIC PANEL
ALBUMIN: 4 g/dL (ref 3.6–5.1)
ALT: 20 U/L (ref 9–46)
AST: 19 U/L (ref 10–35)
Alkaline Phosphatase: 51 U/L (ref 40–115)
BUN: 9 mg/dL (ref 7–25)
CHLORIDE: 105 mmol/L (ref 98–110)
CO2: 21 mmol/L (ref 20–31)
CREATININE: 0.87 mg/dL (ref 0.70–1.33)
Calcium: 8.8 mg/dL (ref 8.6–10.3)
GLUCOSE: 92 mg/dL (ref 65–99)
Potassium: 4 mmol/L (ref 3.5–5.3)
SODIUM: 138 mmol/L (ref 135–146)
Total Bilirubin: 0.3 mg/dL (ref 0.2–1.2)
Total Protein: 6.9 g/dL (ref 6.1–8.1)

## 2015-03-11 MED ORDER — ONDANSETRON 8 MG PO TBDP: 8.0000 mg | ORAL_TABLET | Freq: Three times a day (TID) | ORAL | Status: DC | PRN

## 2015-03-11 NOTE — Patient Instructions (Addendum)
Likely viral gastroenteritis. You are improving.  Bland diet as described and hydrate with [employer] water.  If  Nausea or vomiting returns then start zofran.  If loose stools reoccur then immodium ad.   Described extreme fatigue recently with above. Will get cbc and cmp.  If diarrhea restarts as before over weekend then turn in stool panel on Monday.  Follow up 7 days persisting symptoms or as needed

## 2015-03-11 NOTE — Progress Notes (Signed)
Subjective:    Patient ID: Drew [name], male    DOB: 04-16-1960, 55 y.o.   MRN: [id number]  HPI  Pt in states recently he had upset stomach, diarrhea and vomiting. This started on Monday. Pt states his wife and mother in law had same symptoms. Day before they got sick his grandaughter had upset stomach.   Pt states that onset of illness felt fatigue. He had some fever all week up until yesterda. Pt had some faint body aches slight last night but not now.  Pt states since Wednesday he feels like he has been improving.   Pt states on Tuesday about 10 loose stools. And may have vomited about 10 times. But he describes more dry heaving.  Pt wife and mother in law feel better.   Pt had 2 loose stools yesterday. No vomiting.     Review of Systems  Constitutional: Positive for fatigue. Negative for fever and chills.  HENT: Negative for congestion.   Respiratory: Negative for cough, shortness of breath and wheezing.   Cardiovascular: Negative for chest pain and palpitations.  Gastrointestinal: Negative for nausea, vomiting, abdominal pain, diarrhea, constipation and abdominal distention.       See hpi.  Musculoskeletal: Negative for myalgias.  Skin: Negative for rash.  Neurological: Negative for dizziness and headaches.  Hematological: Negative for adenopathy. Does not bruise/bleed easily.   Past Medical History  Diagnosis Date  . Allergy     Social History   Social History  . Marital Status: Married    Spouse Name: N/A  . Number of Children: N/A  . Years of Education: N/A   Occupational History  . Not on file.   Social History Main Topics  . Smoking status: Current Every Day Smoker -- 1.00 packs/day for 20 years    Types: Cigarettes  . Smokeless tobacco: Never Used  . Alcohol Use: 0.0 oz/week    0 Standard drinks or equivalent per week     Comment: 6 pack a week spread out over weekend  . Drug Use: No  . Sexual Activity: Yes   Other Topics Concern  . Not on  file   Social History Narrative    Past Surgical History  Procedure Laterality Date  . Throat surgery  2005  . Knee arthroscopy  2009    No family history on file.  Allergies  Allergen Reactions  . Tetracyclines & Related     Current Outpatient Prescriptions on File Prior to Visit  Medication Sig Dispense Refill  . traMADol (ULTRAM) 50 MG tablet Take 1 tablet (50 mg total) by mouth every 6 (six) hours as needed. 30 tablet 0   No current facility-administered medications on file prior to visit.    BP 126/82 mmHg  Pulse 77  Temp(Src) 98.1 F (36.7 C) (Oral)  Ht 5' 10.5" (1.791 m)  Wt 310 lb 9.6 oz (140.887 kg)  BMI 43.92 kg/m2  SpO2 98%       Objective:   Physical Exam   General Appearance- Not in acute distress.  HEENT Eyes- Scleraeral/Conjuntiva-bilat- Not Yellow. Mouth & Throat- Normal.  Chest and Lung Exam Auscultation: Breath sounds:-Normal. Adventitious sounds:- No Adventitious sounds.  Cardiovascular Auscultation:Rythm - Regular. Heart Sounds -Normal heart sounds.  Abdomen Inspection:-Inspection Normal.  Palpation/Perucssion: Palpation and Percussion of the abdomen reveal- Non Tender, No Rebound tenderness, No rigidity(Guarding) and No Palpable abdominal masses.  Liver:-Normal.  Spleen:- Normal.   Skin-  moist.     Assessment & Plan:  Likely viral gastroenteritis.  You are improving.  Bland diet as described and hydrate with [employer] water.  If  Nausea or vomiting returns then start zofran.  If loose stools reoccur then immodium ad.   Described extreme fatigue recently with above. Will get cbc and cmp.  If diarrhea restarts as before over weekend then turn in stool panel on Monday.  Follow up 7 days persisting symptoms or as needed

## 2015-03-11 NOTE — Addendum Note (Signed)
Addended by: Gwenevere Abbot on: 03/11/2015 03:46 PM   Modules accepted: Kipp Brood

## 2015-03-11 NOTE — Progress Notes (Signed)
Pre visit review using our clinic review tool, if applicable. No additional management support is needed unless otherwise documented below in the visit note. 

## 2016-05-30 ENCOUNTER — Ambulatory Visit: Payer: Federal, State, Local not specified - PPO | Admitting: Medical

## 2016-05-30 ENCOUNTER — Telehealth: Payer: Self-pay | Admitting: Medical

## 2016-05-30 NOTE — Telephone Encounter (Signed)
Wife left message on VM yesterday at 11:58 PM asking to cancel and reschedule appt for today @ 9:15 AM. Charge or No Charge?

## 2016-05-30 NOTE — Telephone Encounter (Signed)
No charge. 

## 2016-06-01 ENCOUNTER — Encounter: Payer: Self-pay | Admitting: Medical

## 2016-06-01 ENCOUNTER — Ambulatory Visit (INDEPENDENT_AMBULATORY_CARE_PROVIDER_SITE_OTHER): Payer: Federal, State, Local not specified - PPO | Admitting: Medical

## 2016-06-01 VITALS — BP 142/79 | HR 74 | Temp 98.2°F | Resp 16 | Ht 70.0 in | Wt 330.2 lb

## 2016-06-01 DIAGNOSIS — R51 Headache: Secondary | ICD-10-CM | POA: Diagnosis not present

## 2016-06-01 DIAGNOSIS — R519 Headache, unspecified: Secondary | ICD-10-CM

## 2016-06-01 DIAGNOSIS — I10 Essential (primary) hypertension: Secondary | ICD-10-CM | POA: Diagnosis not present

## 2016-06-01 DIAGNOSIS — M25562 Pain in left knee: Secondary | ICD-10-CM | POA: Diagnosis not present

## 2016-06-01 DIAGNOSIS — G8929 Other chronic pain: Secondary | ICD-10-CM

## 2016-06-01 LAB — COMPREHENSIVE METABOLIC PANEL
ALT: 18 U/L (ref 0–53)
AST: 18 U/L (ref 0–37)
Albumin: 4.4 g/dL (ref 3.5–5.2)
Alkaline Phosphatase: 47 U/L (ref 39–117)
BUN: 16 mg/dL (ref 6–23)
CALCIUM: 9.5 mg/dL (ref 8.4–10.5)
CHLORIDE: 106 meq/L (ref 96–112)
CO2: 28 meq/L (ref 19–32)
CREATININE: 0.97 mg/dL (ref 0.40–1.50)
GFR: 103.84 mL/min (ref >60.00)
Glucose, Bld: 98 mg/dL (ref 70–99)
POTASSIUM: 3.9 meq/L (ref 3.5–5.1)
SODIUM: 139 meq/L (ref 135–145)
Total Bilirubin: 0.7 mg/dL (ref 0.2–1.2)
Total Protein: 7.4 g/dL (ref 6.0–8.3)

## 2016-06-01 MED ORDER — LOSARTAN POTASSIUM 100 MG PO TABS: 100.0000 mg | ORAL_TABLET | Freq: Every day | ORAL | 0 refills | Status: DC

## 2016-06-01 MED FILL — LOSARTAN POTASSIUM 100 MG T: 100 | 30 days supply | Qty: 30 | Fill #0

## 2016-06-01 NOTE — Patient Instructions (Signed)
Your headache by your history recently likely is high bp related. I am prescribing losartan today. Start checking bp daily and expect/hope 10-15 pt drop. With high level headache in future check bp and watch for neurologic signs and symptoms as discussed. If you were to develop worrisome symptoms then ED evaluation.  Low salt diet and recommend some weight loss.  If bp drops and ha persist then would recommend imaging studies of head and possible neurologist evaluation.  For knee pain recommend tylenol. Will refer you to sports medicine. Injection might be helpful? Since you failed tramadol and hydrocodone in past this might be good option. Avoid nsaids presently since bp has been high. No ibuprofen and no alleve otc.  Please get cmp today.  Follow up in 2 weeks or as needed

## 2016-06-01 NOTE — Progress Notes (Signed)
Subjective:    Patient ID: Drew [name], male    DOB: 22-Mar-1960, 56 y.o.   MRN: [id number]  HPI  Pt in with some ha recently. Pt states last week his bp was 163/103. He had ha at that time and took aspirin(ha went away in about 3 hours). Pt states his wife checked another time and his  bp was probably in high 140's. This morning he woke up with ha and he felt like he had hangover ha but he did not drink any alcohol.   Only 2 ha in past 2 weeks. No gross motor or sensory function deficits on review when had ha.   This morning ha tapered off. Currently ha he had on wakening went away.  Pt states very rare ha in the past and if he does get is very low level and transient.  One week ago was high level ha. Lasted 3  hours.   Also he reports a lot of knee pain walking a lot at work. He carried heavy items at work 50-60 lb at time. 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    Review of Systems  HENT: Negative for congestion, ear pain, facial swelling, hearing loss, postnasal drip, sinus pressure and trouble swallowing.   Respiratory: Negative for cough, chest tightness and wheezing.   Cardiovascular: Negative for chest pain and palpitations.  Gastrointestinal: Negative for abdominal pain.  Musculoskeletal: Negative for back pain.       Knee pain  Skin: Negative for rash.  Neurological: Positive for headaches. Negative for dizziness, seizures, syncope, speech difficulty, weakness, light-headedness and numbness.  Hematological: Negative for adenopathy. Does not bruise/bleed easily.  Psychiatric/Behavioral: Negative for behavioral problems, confusion, decreased concentration, dysphoric mood and suicidal ideas. The patient is not nervous/anxious.     Past Medical History:  Diagnosis Date  . Allergy      Social History   Social History  . Marital status: Married    Spouse name: N/A  . Number of children: N/A  . Years of education: N/A   Occupational History  . Not on file.   Social  History Main Topics  . Smoking status: Current Every Day Smoker    Packs/day: 1.00    Years: 20.00    Types: Cigarettes  . Smokeless tobacco: Never Used  . Alcohol use 0.0 oz/week     Comment: 6 pack a week spread out over weekend  . Drug use: No  . Sexual activity: Yes   Other Topics Concern  . Not on file   Social History Narrative  . No narrative on file    Past Surgical History:  Procedure Laterality Date  . KNEE ARTHROSCOPY  2009  . THROAT SURGERY  2005    No family history on file.  Allergies  Allergen Reactions  . Tetracyclines & Related     No current outpatient prescriptions on file prior to visit.   No current facility-administered medications on file prior to visit.     BP (!) 142/79 (BP Location: Left Arm, Patient Position: Sitting, Cuff Size: Large)   Pulse 74   Temp 98.2 F (36.8 C) (Oral)   Resp 16   Ht 5\' 10"  (1.778 m)   Wt (!) 330 lb 3.2 oz (149.8 kg)   SpO2 98%   BMI 47.38 kg/m       Objective:   Physical Exam  General Mental Status- Alert. General Appearance- Not in acute distress.   Skin General: Color- Normal Color. Moisture- Normal Moisture.  Neck Carotid Arteries- Normal color. Moisture- Normal Moisture. No carotid bruits. No JVD.  Chest and Lung Exam Auscultation: Breath Sounds:-Normal.  Cardiovascular Auscultation:Rythm- Regular. Murmurs & Other Heart Sounds:Auscultation of the heart reveals- No Murmurs.  Abdomen Inspection:-Inspeection Normal. Palpation/Percussion:Note:No mass. Palpation and Percussion of the abdomen reveal- Non Tender, Non Distended + BS, no rebound or guarding.    Neurologic Cranial Nerve exam:- CN III-XII intact(No nystagmus), symmetric smile. Drift Test:- No drift. Romberg Exam:- Negative.  Heal to Toe Gait exam:-Normal. Finger to Nose:- Normal/Intact Strength:- 5/5 equal and symmetric strength both upper and lower extremities.      Assessment & Plan:  Your headache by your history  recently likely is high bp related. I am prescribing losartan today. Start checking bp daily and expect/hope 10-15 pt drop. With high level headache in future check bp and watch for neurologic signs and symptoms as discussed. If you were to develop worrisome symptoms then ED evaluation.  Low salt diet and recommend some weight loss.  If bp drops and ha persist then would recommend imaging studies of head and possible neurologist evaluation.  For knee pain recommend tylenol. Will refer you to sports medicine. Injection might be helpful? Since you failed tramadol and hydrocodone in past this might be good option. Avoid nsaids presently since bp has been high. No ibuprofen and no alleve otc.  Please get cmp today.  Follow up in 2 weeks or as needed  Jeidy Hoerner, Ramon Dredge, [employer]

## 2016-06-05 ENCOUNTER — Encounter: Payer: Self-pay | Admitting: Family Medicine

## 2016-06-05 ENCOUNTER — Ambulatory Visit (INDEPENDENT_AMBULATORY_CARE_PROVIDER_SITE_OTHER): Payer: Federal, State, Local not specified - PPO | Admitting: Family Medicine

## 2016-06-05 VITALS — BP 144/84 | HR 86 | Ht 71.0 in | Wt 334.0 lb

## 2016-06-05 DIAGNOSIS — G8929 Other chronic pain: Secondary | ICD-10-CM | POA: Diagnosis not present

## 2016-06-05 DIAGNOSIS — M25562 Pain in left knee: Secondary | ICD-10-CM | POA: Diagnosis not present

## 2016-06-05 MED ORDER — METHYLPREDNISOLONE ACETATE 40 MG/ML IJ SUSP
40.0000 mg | Freq: Once | INTRAMUSCULAR | Status: AC
  Administered 2016-06-05: 40 mg via INTRA_ARTICULAR

## 2016-06-05 NOTE — Patient Instructions (Signed)
Your pain is due to arthritis. These are the different medications you can take for this: Tylenol 500mg  1-2 tabs three times a day for pain. Capsaicin, aspercreme, or biofreeze topically up to four times a day may also help with pain. Some supplements that may help for arthritis: Boswellia extract, curcumin, pycnogenol Aleve 1-2 tabs twice a day with food Cortisone injections are an option - you were given this today. If cortisone injections do not help, there are different types of shots that may help but they take longer to take effect. It's important that you continue to stay active. Straight leg raises, knee extensions 3 sets of 10 once a day (add ankle weight if these become too easy). Consider physical therapy to strengthen muscles around the joint that hurts to take pressure off of the joint itself. Shoe inserts with good arch support may be helpful. Walker or cane if needed. Heat or ice 15 minutes at a time 3-4 times a day as needed to help with pain. Water aerobics and cycling with low resistance are the best two types of exercise for arthritis. Follow up with me in 1 month or as needed if you're doing well.

## 2016-06-07 NOTE — Assessment & Plan Note (Signed)
independently reviewed radiographs - mild DJD but not done standing.  Exam is benign and reassuring.  Consistent with arthritis as well.  Discussed tylenol, topical medications, some supplements that may help, aleve.  Injection given today.  Consider viscosupplementation, physical therapy, repeat imaging if not improving.  Shown home exercises to do daily.  F/u in 1 month or prn.  After informed written consent, patient was seated on exam table. Left knee was prepped with alcohol swab and utilizing anteromedial approach, patient's left knee was injected intraarticularly with 3:1 bupivicaine: depomedrol. Patient tolerated the procedure well without immediate complications.

## 2016-06-07 NOTE — Progress Notes (Signed)
PCP and consultation requested by: Esperanza Richters, PA-C  Subjective:   HPI: Patient is a 56 y.o. male here for left knee pain.  Patient reports he's had problems with left knee dating back to at least 2009. At that time he had surgery where left knee was scoped and 'cleaned out.' Pain resolved until 2014 when pain came back. Worse with walking, up to 8/10 and sharp, medial. + swelling. Tried tramadol, hydrocodone, BC powders, aleve. No skin changes, numbness.  Past Medical History:  Diagnosis Date  . Allergy     Current Outpatient Prescriptions on File Prior to Visit  Medication Sig Dispense Refill  . losartan (COZAAR) 100 MG tablet Take 1 tablet (100 mg total) by mouth daily. 30 tablet 0   No current facility-administered medications on file prior to visit.     Past Surgical History:  Procedure Laterality Date  . KNEE ARTHROSCOPY  2009  . THROAT SURGERY  2005    Allergies  Allergen Reactions  . Tetracyclines & Related     Social History   Social History  . Marital status: Married    Spouse name: N/A  . Number of children: N/A  . Years of education: N/A   Occupational History  . Not on file.   Social History Main Topics  . Smoking status: Current Every Day Smoker    Packs/day: 1.00    Years: 20.00    Types: Cigarettes  . Smokeless tobacco: Never Used  . Alcohol use 0.0 oz/week     Comment: 6 pack a week spread out over weekend  . Drug use: No  . Sexual activity: Yes   Other Topics Concern  . Not on file   Social History Narrative  . No narrative on file    No family history on file.  BP (!) 144/84   Pulse 86   Ht 5\' 11"  (1.803 m)   Wt (!) 334 lb (151.5 kg)   BMI 46.58 kg/m   Review of Systems: See HPI above.     Objective:  Physical Exam:  Gen: NAD, comfortable in exam room  Left knee: No gross deformity, ecchymoses, effusion. TTP medial joint line.  No other tenderness. FROM. Negative ant/post drawers. Negative valgus/varus  testing. Negative lachmanns. Negative mcmurrays, apleys, patellar apprehension. NV intact distally.  Right knee: FROM without pain.   Assessment & Plan:  1. Left knee pain - independently reviewed radiographs - mild DJD but not done standing.  Exam is benign and reassuring.  Consistent with arthritis as well.  Discussed tylenol, topical medications, some supplements that may help, aleve.  Injection given today.  Consider viscosupplementation, physical therapy, repeat imaging if not improving.  Shown home exercises to do daily.  F/u in 1 month or prn.  After informed written consent, patient was seated on exam table. Left knee was prepped with alcohol swab and utilizing anteromedial approach, patient's left knee was injected intraarticularly with 3:1 bupivicaine: depomedrol. Patient tolerated the procedure well without immediate complications.

## 2016-06-15 ENCOUNTER — Telehealth: Payer: Self-pay | Admitting: Medical

## 2016-06-15 ENCOUNTER — Ambulatory Visit: Payer: Federal, State, Local not specified - PPO | Admitting: Medical

## 2016-06-15 DIAGNOSIS — Z0289 Encounter for other administrative examinations: Secondary | ICD-10-CM

## 2016-06-15 NOTE — Telephone Encounter (Signed)
Patient lvm at 8:43am cancelling 9am appointment for today due to dental appointment conflict, charge or no charge

## 2016-06-15 NOTE — Telephone Encounter (Signed)
No charge. 

## 2016-07-23 ENCOUNTER — Ambulatory Visit: Payer: Federal, State, Local not specified - PPO | Admitting: Medical

## 2016-07-23 DIAGNOSIS — Z0289 Encounter for other administrative examinations: Secondary | ICD-10-CM

## 2017-11-29 ENCOUNTER — Encounter: Payer: Self-pay | Admitting: Medical

## 2017-11-29 ENCOUNTER — Ambulatory Visit (INDEPENDENT_AMBULATORY_CARE_PROVIDER_SITE_OTHER): Payer: Federal, State, Local not specified - PPO | Admitting: Medical

## 2017-11-29 ENCOUNTER — Ambulatory Visit (HOSPITAL_BASED_OUTPATIENT_CLINIC_OR_DEPARTMENT_OTHER)
Admission: RE | Admit: 2017-11-29 | Discharge: 2017-11-29 | Disposition: A | Discharge status: Home / Self Care | Payer: Federal, State, Local not specified - PPO | Source: Ambulatory Visit | Attending: Medical | Admitting: Medical

## 2017-11-29 VITALS — BP 152/94 | HR 88 | Temp 98.5°F | Resp 16 | Ht 71.0 in | Wt 335.0 lb

## 2017-11-29 DIAGNOSIS — R05 Cough: Secondary | ICD-10-CM | POA: Insufficient documentation

## 2017-11-29 DIAGNOSIS — I517 Cardiomegaly: Secondary | ICD-10-CM | POA: Diagnosis not present

## 2017-11-29 DIAGNOSIS — R0600 Dyspnea, unspecified: Secondary | ICD-10-CM

## 2017-11-29 DIAGNOSIS — R059 Cough, unspecified: Secondary | ICD-10-CM

## 2017-11-29 DIAGNOSIS — R062 Wheezing: Secondary | ICD-10-CM | POA: Diagnosis not present

## 2017-11-29 DIAGNOSIS — R0989 Other specified symptoms and signs involving the circulatory and respiratory systems: Secondary | ICD-10-CM | POA: Diagnosis not present

## 2017-11-29 DIAGNOSIS — F17200 Nicotine dependence, unspecified, uncomplicated: Secondary | ICD-10-CM

## 2017-11-29 DIAGNOSIS — J40 Bronchitis, not specified as acute or chronic: Secondary | ICD-10-CM

## 2017-11-29 DIAGNOSIS — R918 Other nonspecific abnormal finding of lung field: Secondary | ICD-10-CM | POA: Insufficient documentation

## 2017-11-29 LAB — BRAIN NATRIURETIC PEPTIDE: Brain Natriuretic Peptide: 105 pg/mL — ABNORMAL HIGH (ref <100)

## 2017-11-29 MED ORDER — ALBUTEROL SULFATE HFA 108 (90 BASE) MCG/ACT IN AERS: 2.0000 | INHALATION_SPRAY | Freq: Four times a day (QID) | RESPIRATORY_TRACT | 2 refills | Status: DC | PRN

## 2017-11-29 MED ORDER — AMOXICILLIN-POT CLAVULANATE 875-125 MG PO TABS: 1.0000 | ORAL_TABLET | Freq: Two times a day (BID) | ORAL | 0 refills | Status: DC

## 2017-11-29 MED ORDER — BUDESONIDE-FORMOTEROL FUMARATE 80-4.5 MCG/ACT IN AERO: 2.0000 | INHALATION_SPRAY | Freq: Two times a day (BID) | RESPIRATORY_TRACT | 0 refills | Status: DC

## 2017-11-29 MED ORDER — AZITHROMYCIN 250 MG PO TABS: ORAL_TABLET | ORAL | 0 refills | Status: DC

## 2017-11-29 MED ORDER — BENZONATATE 100 MG PO CAPS: 100.0000 mg | ORAL_CAPSULE | Freq: Three times a day (TID) | ORAL | 0 refills | Status: DC | PRN

## 2017-11-29 MED ORDER — PREDNISONE 10 MG PO TABS: ORAL_TABLET | ORAL | 0 refills | Status: DC

## 2017-11-29 MED FILL — BENZONATATE 100 MG CAPS: 100 | 10 days supply | Qty: 30 | Fill #0

## 2017-11-29 MED FILL — AZITHROMYCIN 250 MG TABLET: 250 | 5 days supply | Qty: 6 | Fill #0

## 2017-11-29 MED FILL — ALBUTEROL SULFATE HFA 108 (: 108 (90 BAS | 25 days supply | Qty: 18 | Fill #0

## 2017-11-29 MED FILL — SYMBICORT 80-4.5 MCG INH: 80-4.5 | 60 days supply | Qty: 10 | Fill #0

## 2017-11-29 NOTE — Progress Notes (Signed)
Subjective:    Patient ID: Drew [name], male    DOB: 1960/08/05, 57 y.o.   MRN: [id number]  HPI  Pt in for evaluation.  He states since Oct 26, 2017 he has on and off wheezing and shortness of breath. He states every 2-3 days he will start to wheeze He states will get very dry cough at times. He will get very dry cough both day and night.   Pt stats he does not have chronic hx of wheezing.  Pt stats he has been smoking a pack a day. He states smoked like that for 20 years. Pt had no fevers, chills or sweats recently.  Pt states no chest pain. He only states on brief one second transient sharp pain only when he coughs.   No leg pain. No jaw pain, no shoulder pain and no arm pain. No sweating, no nauseaqu or vomiting.  No orthopnea.  Review of Systems  Constitutional: Negative for chills, fatigue and fever.  HENT: Negative for dental problem.   Respiratory: Positive for cough, shortness of breath and wheezing.        Intermittent productive. After delsym mucus came up.  Musculoskeletal: Negative for back pain and neck pain.  Neurological: Negative for dizziness and headaches.  Hematological: Negative for adenopathy. Does not bruise/bleed easily.  Psychiatric/Behavioral: Negative for behavioral problems and confusion.     Past Medical History:  Diagnosis Date  . Allergy      Social History   Socioeconomic History  . Marital status: Married    Spouse name: Not on file  . Number of children: Not on file  . Years of education: Not on file  . Highest education level: Not on file  Occupational History  . Not on file  Social Needs  . Financial resource strain: Not on file  . Food insecurity:    Worry: Not on file    Inability: Not on file  . Transportation needs:    Medical: Not on file    Non-medical: Not on file  Tobacco Use  . Smoking status: Current Every Day Smoker    Packs/day: 1.00    Years: 20.00    Pack years: 20.00    Types: Cigarettes  . Smokeless  tobacco: Never Used  Substance and Sexual Activity  . Alcohol use: Yes    Alcohol/week: 0.0 standard drinks    Comment: 6 pack a week spread out over weekend  . Drug use: No  . Sexual activity: Yes  Lifestyle  . Physical activity:    Days per week: Not on file    Minutes per session: Not on file  . Stress: Not on file  Relationships  . Social connections:    Talks on phone: Not on file    Gets together: Not on file    Attends religious service: Not on file    Active member of club or organization: Not on file    Attends meetings of clubs or organizations: Not on file    Relationship status: Not on file  . Intimate partner violence:    Fear of current or ex partner: Not on file    Emotionally abused: Not on file    Physically abused: Not on file    Forced sexual activity: Not on file  Other Topics Concern  . Not on file  Social History Narrative  . Not on file    Past Surgical History:  Procedure Laterality Date  . KNEE ARTHROSCOPY  2009  . THROAT  SURGERY  2005    No family history on file.  Allergies  Allergen Reactions  . Tetracyclines & Related     No current outpatient medications on file prior to visit.   No current facility-administered medications on file prior to visit.     BP (!) 152/94   Pulse 88   Temp 98.5 F (36.9 C) (Oral)   Resp 16   Ht 5\' 11"  (1.803 m)   Wt (!) 335 lb (152 kg)   SpO2 96%   BMI 46.72 kg/m        Objective:   Physical Exam  General  Mental Status - Alert. General Appearance - Well groomed. Not in acute distress.  Skin Rashes- No Rashes.  HEENT Head- Normal. Ear Auditory Canal - Left- Normal. Right - Normal.Tympanic Membrane- Left- Normal. Right- Normal. Eye Sclera/Conjunctiva- Left- Normal. Right- Normal. Nose & Sinuses Nasal Mucosa- Left-  Boggy and Congested. Right-  Boggy and  Congested.Bilateral no maxillary and no  frontal sinus pressure. Mouth & Throat Lips: Upper Lip- Normal: no dryness, cracking,  pallor, cyanosis, or vesicular eruption. Lower Lip-Normal: no dryness, cracking, pallor, cyanosis or vesicular eruption. Buccal Mucosa- Bilateral- No Aphthous ulcers. Oropharynx- No Discharge or Erythema. Tonsils: Characteristics- Bilateral- No Erythema or Congestion. Size/Enlargement- Bilateral- No enlargement. Discharge- bilateral-None.  Neck Neck- Supple. No Masses.   Chest and Lung Exam Auscultation: Breath Sounds:-Clear even and unlabored.  Cardiovascular Auscultation:Rythm- Regular, rate and rhythm. Murmurs & Other Heart Sounds:Ausculatation of the heart reveal- No Murmurs.  Lymphatic Head & Neck General Head & Neck Lymphatics: Bilateral: Description- No Localized lymphadenopathy.  Lower ext- no pedal edema. Negative homans signs.     Assessment & Plan:  You do appear to have some bronchitis symptoms recently with wheezing and history of smoking.  I am prescribing a azithromycin antibiotic and benzonatate cough tablets.  For wheezing, I am prescribing Symbicort inhaler.  If you have wheezing despite use of Symbicort then add albuterol inhaler.  Making prednisone print prescription available in the event of worsening dyspnea/wheezing despite use of inhalers.  Please get chest x-ray today.  Also getting a CBC, CMP and BNP.  I do want you to stop smoking.  We will consider prescribing Wellbutrin in the near future.  Follow-up in 2 weeks for CPE.  But sooner if needed for above signs and symptoms.  After xray study came back adding augmentin antibiotic to get better coverage for pneumonia  Esperanza Richters, PA-C

## 2017-11-29 NOTE — Patient Instructions (Signed)
You do appear to have some bronchitis symptoms recently with wheezing and history of smoking.  I am prescribing a azithromycin antibiotic and benzonatate cough tablets.  For wheezing, I am prescribing Symbicort inhaler.  If you have wheezing despite use of Symbicort then add albuterol inhaler.  Making prednisone print prescription available in the event of worsening dyspnea/wheezing despite use of inhalers.  Please get chest x-ray today.  Also getting a CBC, CMP and BNP.  I do want you to stop smoking.  We will consider prescribing Wellbutrin in the near future.  Follow-up in 2 weeks for CPE.  But sooner if needed for above signs and symptoms.

## 2017-11-30 LAB — COMPREHENSIVE METABOLIC PANEL
AG RATIO: 1.2 (calc) (ref 1.0–2.5)
ALT: 17 U/L (ref 9–46)
AST: 14 U/L (ref 10–35)
Albumin: 3.9 g/dL (ref 3.6–5.1)
Alkaline phosphatase (APISO): 49 U/L (ref 40–115)
BILIRUBIN TOTAL: 0.6 mg/dL (ref 0.2–1.2)
BUN: 10 mg/dL (ref 7–25)
CALCIUM: 8.9 mg/dL (ref 8.6–10.3)
CO2: 26 mmol/L (ref 20–32)
Chloride: 107 mmol/L (ref 98–110)
Creat: 0.92 mg/dL (ref 0.70–1.33)
Globulin: 3.2 g/dL (calc) (ref 1.9–3.7)
Glucose, Bld: 91 mg/dL (ref 65–99)
Potassium: 3.9 mmol/L (ref 3.5–5.3)
Sodium: 140 mmol/L (ref 135–146)
Total Protein: 7.1 g/dL (ref 6.1–8.1)

## 2017-11-30 LAB — CBC WITH DIFFERENTIAL/PLATELET
Basophils Absolute: 33 cells/uL (ref 0–200)
Basophils Relative: 0.5 %
EOS ABS: 231 {cells}/uL (ref 15–500)
Eosinophils Relative: 3.5 %
HCT: 43.5 % (ref 38.5–50.0)
Hemoglobin: 14.8 g/dL (ref 13.2–17.1)
Lymphs Abs: 2112 cells/uL (ref 850–3900)
MCH: 31.7 pg (ref 27.0–33.0)
MCHC: 34 g/dL (ref 32.0–36.0)
MCV: 93.1 fL (ref 80.0–100.0)
MPV: 10 fL (ref 7.5–12.5)
Monocytes Relative: 7.1 %
NEUTROS PCT: 56.9 %
Neutro Abs: 3755 cells/uL (ref 1500–7800)
PLATELETS: 226 10*3/uL (ref 140–400)
RBC: 4.67 10*6/uL (ref 4.20–5.80)
RDW: 12.4 % (ref 11.0–15.0)
TOTAL LYMPHOCYTE: 32 %
WBC: 6.6 10*3/uL (ref 3.8–10.8)
WBCMIX: 469 {cells}/uL (ref 200–950)

## 2017-12-16 ENCOUNTER — Ambulatory Visit: Payer: Federal, State, Local not specified - PPO | Admitting: Medical

## 2017-12-16 DIAGNOSIS — Z0289 Encounter for other administrative examinations: Secondary | ICD-10-CM

## 2017-12-17 ENCOUNTER — Encounter: Payer: Self-pay | Admitting: Medical

## 2017-12-17 ENCOUNTER — Ambulatory Visit: Payer: Federal, State, Local not specified - PPO | Admitting: Medical

## 2017-12-17 ENCOUNTER — Telehealth: Payer: Self-pay | Admitting: Medical

## 2017-12-17 ENCOUNTER — Ambulatory Visit (HOSPITAL_BASED_OUTPATIENT_CLINIC_OR_DEPARTMENT_OTHER)
Admission: RE | Admit: 2017-12-17 | Discharge: 2017-12-17 | Disposition: A | Discharge status: Home / Self Care | Payer: Federal, State, Local not specified - PPO | Source: Ambulatory Visit | Attending: Medical | Admitting: Medical

## 2017-12-17 VITALS — BP 141/103 | HR 93 | Temp 98.2°F | Resp 16 | Ht 71.0 in | Wt 331.4 lb

## 2017-12-17 DIAGNOSIS — Z125 Encounter for screening for malignant neoplasm of prostate: Secondary | ICD-10-CM | POA: Diagnosis not present

## 2017-12-17 DIAGNOSIS — I517 Cardiomegaly: Secondary | ICD-10-CM

## 2017-12-17 DIAGNOSIS — Z0000 Encounter for general adult medical examination without abnormal findings: Secondary | ICD-10-CM

## 2017-12-17 DIAGNOSIS — Z0001 Encounter for general adult medical examination with abnormal findings: Secondary | ICD-10-CM | POA: Diagnosis not present

## 2017-12-17 DIAGNOSIS — I10 Essential (primary) hypertension: Secondary | ICD-10-CM

## 2017-12-17 DIAGNOSIS — Z1211 Encounter for screening for malignant neoplasm of colon: Secondary | ICD-10-CM

## 2017-12-17 LAB — LIPID PANEL
CHOL/HDL RATIO: 5
Cholesterol: 199 mg/dL (ref 0–200)
HDL: 42.8 mg/dL (ref >39.00)
LDL CALC: 139 mg/dL — AB (ref 0–99)
NonHDL: 155.9
TRIGLYCERIDES: 85 mg/dL (ref 0.0–149.0)
VLDL: 17 mg/dL (ref 0.0–40.0)

## 2017-12-17 LAB — CBC WITH DIFFERENTIAL/PLATELET
BASOS ABS: 0.1 10*3/uL (ref 0.0–0.1)
Basophils Relative: 1.4 % (ref 0.0–3.0)
EOS ABS: 0.3 10*3/uL (ref 0.0–0.7)
EOS PCT: 5.2 % — AB (ref 0.0–5.0)
HEMATOCRIT: 47.3 % (ref 39.0–52.0)
Hemoglobin: 15.9 g/dL (ref 13.0–17.0)
Lymphocytes Relative: 32.6 % (ref 12.0–46.0)
Lymphs Abs: 1.8 10*3/uL (ref 0.7–4.0)
MCHC: 33.6 g/dL (ref 30.0–36.0)
MCV: 96 fl (ref 78.0–100.0)
MONOS PCT: 6.4 % (ref 3.0–12.0)
Monocytes Absolute: 0.3 10*3/uL (ref 0.1–1.0)
Neutro Abs: 2.9 10*3/uL (ref 1.4–7.7)
Neutrophils Relative %: 54.4 % (ref 43.0–77.0)
Platelets: 224 10*3/uL (ref 150.0–400.0)
RBC: 4.93 Mil/uL (ref 4.22–5.81)
RDW: 13.6 % (ref 11.5–15.5)
WBC: 5.4 10*3/uL (ref 4.0–10.5)

## 2017-12-17 LAB — COMPREHENSIVE METABOLIC PANEL
ALBUMIN: 4.3 g/dL (ref 3.5–5.2)
ALT: 19 U/L (ref 0–53)
AST: 14 U/L (ref 0–37)
Alkaline Phosphatase: 49 U/L (ref 39–117)
BILIRUBIN TOTAL: 0.9 mg/dL (ref 0.2–1.2)
BUN: 14 mg/dL (ref 6–23)
CALCIUM: 9 mg/dL (ref 8.4–10.5)
CHLORIDE: 107 meq/L (ref 96–112)
CO2: 24 meq/L (ref 19–32)
Creatinine, Ser: 0.93 mg/dL (ref 0.40–1.50)
GFR: 108.39 mL/min (ref >60.00)
Glucose, Bld: 103 mg/dL — ABNORMAL HIGH (ref 70–99)
Potassium: 4.1 mEq/L (ref 3.5–5.1)
Sodium: 140 mEq/L (ref 135–145)
Total Protein: 7.5 g/dL (ref 6.0–8.3)

## 2017-12-17 LAB — PSA: PSA: 0.54 ng/mL (ref 0.10–4.00)

## 2017-12-17 MED ORDER — ROSUVASTATIN CALCIUM 10 MG PO TABS: 10.0000 mg | ORAL_TABLET | Freq: Every day | ORAL | 3 refills | Status: DC

## 2017-12-17 MED ORDER — CARVEDILOL 3.125 MG PO TABS: 3.1250 mg | ORAL_TABLET | Freq: Two times a day (BID) | ORAL | 0 refills | Status: DC

## 2017-12-17 MED ORDER — BUPROPION HCL ER (XL) 150 MG PO TB24: 150.0000 mg | ORAL_TABLET | Freq: Every day | ORAL | 1 refills | Status: DC

## 2017-12-17 NOTE — Telephone Encounter (Signed)
Rx crestor sent to pt pharmacy. 

## 2017-12-17 NOTE — Progress Notes (Addendum)
Subjective:    Patient ID: Drew [name], male    DOB: [birth year], 57 y.o.   MRN: [id number]  HPI  Pt in for follow up.  He is in for cpe. Pt is fasting presently.  Updates me on recent bronchitis. He states he does feel better overall. Breathing is much improved. He states he improved with azithromycin, benzonatate and symbicort.  Treated for bronchitis with wheezing.  Pt never had to take the prednisone.  Pt declines flu vaccine despite counseling.  Pt does have history of htn and bp is high today. Recent cxr showed cardiomegaly. Xray done at time to evaluated his bronchitis. Cardiomegaly found incidentally and mention of effusion.     Review of Systems  Constitutional: Negative for chills, fatigue and fever.  HENT: Negative for congestion, drooling, ear discharge, mouth sores, nosebleeds and postnasal drip.   Respiratory: Negative for cough, chest tightness, shortness of breath and wheezing.   Cardiovascular: Negative for chest pain and palpitations.  Gastrointestinal: Negative for abdominal pain and constipation.  Musculoskeletal: Negative for back pain, joint swelling and neck stiffness.  Skin: Negative for rash.  Neurological: Negative for dizziness and facial asymmetry.  Hematological: Negative for adenopathy. Does not bruise/bleed easily.  Psychiatric/Behavioral: Negative for behavioral problems, confusion and dysphoric mood. The patient is not nervous/anxious.     Past Medical History:  Diagnosis Date  . Allergy      Social History   Socioeconomic History  . Marital status: Married    Spouse name: Not on file  . Number of children: Not on file  . Years of education: Not on file  . Highest education level: Not on file  Occupational History  . Not on file  Social Needs  . Financial resource strain: Not on file  . Food insecurity:    Worry: Not on file    Inability: Not on file  . Transportation needs:    Medical: Not on file    Non-medical: Not on  file  Tobacco Use  . Smoking status: Current Every Day Smoker    Packs/day: 1.00    Years: 20.00    Pack years: 20.00    Types: Cigarettes  . Smokeless tobacco: Never Used  Substance and Sexual Activity  . Alcohol use: Yes    Alcohol/week: 0.0 standard drinks    Comment: 6 pack a week spread out over weekend  . Drug use: No  . Sexual activity: Yes  Lifestyle  . Physical activity:    Days per week: Not on file    Minutes per session: Not on file  . Stress: Not on file  Relationships  . Social connections:    Talks on phone: Not on file    Gets together: Not on file    Attends religious service: Not on file    Active member of club or organization: Not on file    Attends meetings of clubs or organizations: Not on file    Relationship status: Not on file  . Intimate partner violence:    Fear of current or ex partner: Not on file    Emotionally abused: Not on file    Physically abused: Not on file    Forced sexual activity: Not on file  Other Topics Concern  . Not on file  Social History Narrative  . Not on file    Past Surgical History:  Procedure Laterality Date  . KNEE ARTHROSCOPY  2009  . THROAT SURGERY  2005    No family history  on file.  Allergies  Allergen Reactions  . Tetracyclines & Related     Current Outpatient Medications on File Prior to Visit  Medication Sig Dispense Refill  . albuterol (PROVENTIL HFA;VENTOLIN HFA) 108 (90 Base) MCG/ACT inhaler Inhale 2 puffs into the lungs every 6 (six) hours as needed for wheezing or shortness of breath. 1 Inhaler 2  . budesonide-formoterol (SYMBICORT) 80-4.5 MCG/ACT inhaler Inhale 2 puffs into the lungs 2 (two) times daily. 1 Inhaler 0   No current facility-administered medications on file prior to visit.     BP (!) 141/103   Pulse 93   Temp 98.2 F (36.8 C) (Oral)   Resp 16   Ht 5\' 11"  (1.803 m)   Wt (!) 331 lb 6.4 oz (150.3 kg)   SpO2 95%   BMI 46.22 kg/m       Objective:   Physical  Exam  General Mental Status- Alert. General Appearance- Not in acute distress.   Skin General: Color- Normal Color. Moisture- Normal Moisture.  Neck Carotid Arteries- Normal color. Moisture- Normal Moisture. No carotid bruits. No JVD.  Chest and Lung Exam Auscultation: Breath Sounds:-Normal.  Cardiovascular Auscultation:Rythm- Regular. Murmurs & Other Heart Sounds:Auscultation of the heart reveals- No Murmurs.  Abdomen Inspection:-Inspeection Normal. Palpation/Percussion:Note:No mass. Palpation and Percussion of the abdomen reveal- Non Tender, Non Distended + BS, no rebound or guarding.    Neurologic Cranial Nerve exam:- CN III-XII intact(No nystagmus), symmetric smile. Strength:- 5/5 equal and symmetric strength both upper and lower extremities.      Assessment & Plan:  For you wellness exam today I have ordered cbc, cmp, psa, lipid panel, and ua.  Flu vaccine declined.  Recommend exercise and healthy diet.  We will let you know lab results as they come in.   For htn and mild cardiomegaly rx carvedilol. Want to check bp in one month. Might need to add diuretic.  For hx of wheezing recommend continue symbicort.  Recommend stop smoking and counseled. Rx wellbutrin today.  Follow up in one month or as needed.  Modifyer charge since followed up on bronchits, treated and counseled on cardiomegaly and htn.   Esperanza Richters, PA-C

## 2017-12-17 NOTE — Telephone Encounter (Signed)
Will you result patient urine. I had to take out order to close chart. Please put back order in and result.

## 2017-12-17 NOTE — Patient Instructions (Addendum)
For you wellness exam today I have ordered cbc, cmp, psa, lipid panel, and ua.  Flu vaccine declined.  Recommend exercise and healthy diet.  We will let you know lab results as they come in.  Referral to GI placed for colonoscopy.  Bronchitis resolved.  For htn and mild cardiomegaly rx carvedilol. Want to check bp in one month. Might need to add diuretic.  For hx of wheezing and current smoker  recommend continue symbicort.  Recommend stop smoking and counseled. Rx wellbutrin today.  Xray done today to recheck done to see  if effusion still present. Re-assess size of heart.  Follow up in one month or as needed     Preventive Care 40-64 Years, Male Preventive care refers to lifestyle choices and visits with your health care provider that can promote health and wellness. What does preventive care include?  A yearly physical exam. This is also called an annual well check.  Dental exams once or twice a year.  Routine eye exams. Ask your health care provider how often you should have your eyes checked.  Personal lifestyle choices, including: ? Daily care of your teeth and gums. ? Regular physical activity. ? Eating a healthy diet. ? Avoiding tobacco and drug use. ? Limiting alcohol use. ? Practicing safe sex. ? Taking low-dose aspirin every day starting at age 38. What happens during an annual well check? The services and screenings done by your health care provider during your annual well check will depend on your age, overall health, lifestyle risk factors, and family history of disease. Counseling Your health care provider may ask you questions about your:  Alcohol use.  Tobacco use.  Drug use.  Emotional well-being.  Home and relationship well-being.  Sexual activity.  Eating habits.  Work and work Statistician.  Screening You may have the following tests or measurements:  Height, weight, and BMI.  Blood pressure.  Lipid and cholesterol levels.  These may be checked every 5 years, or more frequently if you are over 26 years old.  Skin check.  Lung cancer screening. You may have this screening every year starting at age 94 if you have a 30-pack-year history of smoking and currently smoke or have quit within the past 15 years.  Fecal occult blood test (FOBT) of the stool. You may have this test every year starting at age 22.  Flexible sigmoidoscopy or colonoscopy. You may have a sigmoidoscopy every 5 years or a colonoscopy every 10 years starting at age 56.  Prostate cancer screening. Recommendations will vary depending on your family history and other risks.  Hepatitis C blood test.  Hepatitis B blood test.  Sexually transmitted disease (STD) testing.  Diabetes screening. This is done by checking your blood sugar (glucose) after you have not eaten for a while (fasting). You may have this done every 1-3 years.  Discuss your test results, treatment options, and if necessary, the need for more tests with your health care provider. Vaccines Your health care provider may recommend certain vaccines, such as:  Influenza vaccine. This is recommended every year.  Tetanus, diphtheria, and acellular pertussis (Tdap, Td) vaccine. You may need a Td booster every 10 years.  Varicella vaccine. You may need this if you have not been vaccinated.  Zoster vaccine. You may need this after age 66.  Measles, mumps, and rubella (MMR) vaccine. You may need at least one dose of MMR if you were born in 1957 or later. You may also need a second dose.  Pneumococcal 13-valent conjugate (PCV13) vaccine. You may need this if you have certain conditions and have not been vaccinated.  Pneumococcal polysaccharide (PPSV23) vaccine. You may need one or two doses if you smoke cigarettes or if you have certain conditions.  Meningococcal vaccine. You may need this if you have certain conditions.  Hepatitis A vaccine. You may need this if you have certain  conditions or if you travel or work in places where you may be exposed to hepatitis A.  Hepatitis B vaccine. You may need this if you have certain conditions or if you travel or work in places where you may be exposed to hepatitis B.  Haemophilus influenzae type b (Hib) vaccine. You may need this if you have certain risk factors.  Talk to your health care provider about which screenings and vaccines you need and how often you need them. This information is not intended to replace advice given to you by your health care provider. Make sure you discuss any questions you have with your health care provider. Document Released: 01/28/2015 Document Revised: 09/21/2015 Document Reviewed: 11/02/2014 Elsevier Interactive Patient Education  [employer].

## 2017-12-18 LAB — POC URINALSYSI DIPSTICK (AUTOMATED)
Bilirubin, UA: NEGATIVE
Blood, UA: NEGATIVE
Glucose, UA: NEGATIVE
Ketones, UA: NEGATIVE
Leukocytes, UA: NEGATIVE
Nitrite, UA: NEGATIVE
Protein, UA: NEGATIVE
SPEC GRAV UA: 1.02 (ref 1.010–1.025)
Urobilinogen, UA: NEGATIVE E.U./dL — AB
pH, UA: 6 (ref 5.0–8.0)

## 2017-12-18 NOTE — Telephone Encounter (Signed)
Spoke to Jasmine, CMA, who stated she would address today.  

## 2017-12-18 NOTE — Telephone Encounter (Signed)
Urine resulted  

## 2017-12-18 NOTE — Addendum Note (Signed)
Addended by: Orlene Och on: 12/18/2017 09:57 AM   Modules accepted: Orders

## 2017-12-31 ENCOUNTER — Telehealth: Payer: Self-pay | Admitting: Medical

## 2017-12-31 ENCOUNTER — Ambulatory Visit: Payer: Self-pay

## 2017-12-31 NOTE — Telephone Encounter (Signed)
Copied from CRM [id number]. Topic: Quick Communication - See Telephone Encounter >> Dec 31, 2017  3:57 PM Stephannie Li, NT wrote: CRM for notification. See Telephone encounter for: 12/31/17. Patient and his wife called to follow up on previous call from made earlier today and would like a return call as soon as possible ,

## 2017-12-31 NOTE — Telephone Encounter (Signed)
Pt. And his wife called to report his BP has been elevated since Sunday. Today it is 157/103. Pt. Does have a headache and has been feeling tired. Has not missed any doses of his medication. Reports Dr. Drue Novel told "me he might have to increase my dose." Please advise pt.Contact number [phone].  Answer Assessment - Initial Assessment Questions 1. BLOOD PRESSURE: "What is the blood pressure?" "Did you take at least two measurements 5 minutes apart?"    157/103 2. ONSET: "When did you take your blood pressure?"     Just now 3. HOW: "How did you obtain the blood pressure?" (e.g., visiting nurse, automatic home BP monitor)     Home BP monitor 4. HISTORY: "Do you have a history of high blood pressure?"     Yes 5. MEDICATIONS: "Are you taking any medications for blood pressure?" "Have you missed any doses recently?"     No 6. OTHER SYMPTOMS: "Do you have any symptoms?" (e.g., headache, chest pain, blurred vision, difficulty breathing, weakness)     Headache, tired 7. PREGNANCY: "Is there any chance you are pregnant?" "When was your last menstrual period?"     n/a  Protocols used: HIGH BLOOD PRESSURE-A-AH

## 2018-01-02 NOTE — Telephone Encounter (Signed)
Author phoned pt. To follow up on elevated BP. Pt stated his BP has been better now, but did not give reading. Pt stated he no longer has HA or feeling tired. "I figured it out, it was the pot roast. I ate pot roast for two days and it shot my pressure up. Too much salt! So I threw it out". Pt. denies any other concerns at this time.

## 2018-01-02 NOTE — Telephone Encounter (Signed)
Duplicate note

## 2018-01-21 ENCOUNTER — Ambulatory Visit: Payer: Federal, State, Local not specified - PPO | Admitting: Medical

## 2018-01-28 ENCOUNTER — Encounter: Payer: Self-pay | Admitting: Medical

## 2018-05-07 ENCOUNTER — Other Ambulatory Visit: Payer: Self-pay

## 2018-05-07 ENCOUNTER — Ambulatory Visit (INDEPENDENT_AMBULATORY_CARE_PROVIDER_SITE_OTHER): Payer: Federal, State, Local not specified - PPO | Admitting: Medical

## 2018-05-07 ENCOUNTER — Ambulatory Visit: Payer: Self-pay

## 2018-05-07 ENCOUNTER — Encounter: Payer: Self-pay | Admitting: Medical

## 2018-05-07 DIAGNOSIS — R062 Wheezing: Secondary | ICD-10-CM

## 2018-05-07 DIAGNOSIS — F17200 Nicotine dependence, unspecified, uncomplicated: Secondary | ICD-10-CM

## 2018-05-07 DIAGNOSIS — R0600 Dyspnea, unspecified: Secondary | ICD-10-CM | POA: Diagnosis not present

## 2018-05-07 MED ORDER — PREDNISONE 10 MG (21) PO TBPK: ORAL_TABLET | ORAL | 0 refills | Status: DC

## 2018-05-07 MED ORDER — BUDESONIDE-FORMOTEROL FUMARATE 80-4.5 MCG/ACT IN AERO: 2.0000 | INHALATION_SPRAY | Freq: Two times a day (BID) | RESPIRATORY_TRACT | 3 refills | Status: DC

## 2018-05-07 NOTE — Telephone Encounter (Signed)
Pt. Called to report shortness of breath over past 3 weeks.  Stated he gets short of breath with walking short distance.  Denied shortness of breath at rest.  Denied fever/chills/ sweats.  Stated he is a smoker and has cut down on amount of cigarettes he smokes / day.  Reported he coughs in the morning, but this is not unusual for him, since he smokes.  C/o occas. wheeze; reported noting this a couple times, in past 3 weeks.  Denied chest discomfort.  Denied pain with taking in deep breath.  Stated he has abdominal bloating, which started about the time his shortness of breath started 3 weeks ago.  Denied any abdominal pain or cramping.  Passing gas.  Reported about 2 BM's / day with normal consistency.  Denied nausea or vomiting.  Stated he feels very full; does not have much of an appetite.  Called office and transferred pt. to a Scheduler, to set up Virtual Visit with PCP.  Pt. Agrees with plan.         Reason for Disposition . [1] MILD difficulty breathing (e.g., minimal/no SOB at rest, SOB with walking, pulse <100) AND [2] NEW-onset or WORSE than normal    Reported shortness of breath over past 3 weeks; not really improving; occas. Wheeze ; stated has abdominal bloating.  Answer Assessment - Initial Assessment Questions 1. RESPIRATORY STATUS: "Describe your breathing?" (e.g., wheezing, shortness of breath, unable to speak, severe coughing)      Feels that his breath is very short, with activity; comes and goes  2. ONSET: "When did this breathing problem begin?"      About 3 weeks ago 3. PATTERN "Does the difficult breathing come and go, or has it been constant since it started?"      Comes and goes 4. SEVERITY: "How bad is your breathing?" (e.g., mild, moderate, severe)    - MILD: No SOB at rest, mild SOB with walking, speaks normally in sentences, can lay down, no retractions, pulse < 100.    - MODERATE: SOB at rest, SOB with minimal exertion and prefers to sit, cannot lie down flat, speaks in  phrases, mild retractions, audible wheezing, pulse 100-120.    - SEVERE: Very SOB at rest, speaks in single words, struggling to breathe, sitting hunched forward, retractions, pulse > 120      Moderate  5. RECURRENT SYMPTOM: "Have you had difficulty breathing before?" If so, ask: "When was the last time?" and "What happened that time?"      A couple months ago- treated with antibiotic 6. CARDIAC HISTORY: "Do you have any history of heart disease?" (e.g., heart attack, angina, bypass surgery, angioplasty)      Denied heart disease 7. LUNG HISTORY: "Do you have any history of lung disease?"  (e.g., pulmonary embolus, asthma, emphysema)     No hx of lung problems; has several allergies 8. CAUSE: "What do you think is causing the breathing problem?"      unknown 9. OTHER SYMPTOMS: "Do you have any other symptoms? (e.g., dizziness, runny nose, cough, chest pain, fever)     C/o shortness of breath; c/o abdominal bloating; feels very full; having normal BM's about 2 x / day; passing a lot of gas; denied abd. pain ; denied chest pain ; denied chills/ fever/ sweats 10. PREGNANCY: "Is there any chance you are pregnant?" "When was your last menstrual period?"      N/a  11. TRAVEL: "Have you traveled out of the country in the  last month?" (e.g., travel history, exposures)       Denied  Protocols used: BREATHING DIFFICULTY-A-AH

## 2018-05-07 NOTE — Patient Instructions (Signed)
Pt has history of smoking with recent dysppnea on exertion as well as similar presentation in November. No described cardiac signs or symptoms.Reports recently doing well with symbicort until ran out recently. Also reported relief with albuterol.  Advised/explained with virtual visits disadvantaged since can't get 02 sat% or listen to lungs today. Did explain I think he is likely having symptoms due to smoking and some copd component. Will Rx 6 days prednisone(start this afternoon), refill his symbicort and rx albuterol. Want up date tomorrow at around 2 pm as to how he is feeling. Make sure some incrimental/significant improvement. If not any then will order cxr, cbc, cmp and bnp. Might even get ekg throught our office though his reported desciption does not seem cardiac like.  Will follow closely and see if needs further eval. I don't think antibiotic indicated today. Did ask pt to check T and notify us any fever over 100 or if other infectious type symptoms occur.

## 2018-05-07 NOTE — Telephone Encounter (Signed)
Virtual visit scheduled.  

## 2018-05-07 NOTE — Progress Notes (Signed)
   Subjective:    Patient ID: Drew [name], male    DOB: 07-23-1960, 58 y.o.   MRN: [id number]  HPI Virtual Visit via Video Note  I connected with Vanessa [place] on 05/07/18 at  1:00 PM EDT by a video enabled telemedicine application and verified that I am speaking with the correct person using two identifiers.   I discussed the limitations of evaluation and management by telemedicine and the availability of in person appointments. The patient expressed understanding and agreed to proceed.   History of Present Illness:  Pt has no bp cuff to check his blood pressure or pulse. He has no o2 sat device. Pt reports no leg swelling.He states he walks about 30 feet and he states needs to stop due to dyspnea. No orthopnea reported.  Couple of days ago he heard himself wheeze.   He has no fever or any constant cough. Only cough in morning in morning.  He was smoking about one pack a day. He has cut back to half pack day now. He tried wellbutrin but did not help.  Pt is sitting today on exam. No labored breathing. No problems breathing when seated.  Pt still has albuterol. Over past 3 weeks he will use albuterol and he feels like it helps for about 8 hours. Pt has just finished symbicort.   November he felt the same and responded to symbicort, zpack and aluterol. He never had to use prednisone. Pt is not diabetic.      Observations/Objective: No acute distress. Speaks non labored. Pt states legs not swollen and symmetric.  Assessment and Plan: Pt has history of smoking with recent dysppnea on exertion as well as similar presentation in November. No described cardiac signs or symptoms.Reports recently doing well with symbicort until ran out recently. Also reported relief with albuterol.  Advised/explained with virtual visits disadvantaged since can't get 02 sat% or listen to lungs today. Did explain I think he is likely having symptoms due to smoking and some copd component. Will Rx 6 days  prednisone(start this afternoon), refill his symbicort and rx albuterol. Want up date tomorrow at around 2 pm as to how he is feeling. Make sure some incrimental/significant improvement. If not any then will order cxr, cbc, cmp and bnp. Might even get ekg throught our office though his reported desciption does not seem cardiac like.  Will follow closely and see if needs further eval. I don't think antibiotic indicated today. Did ask pt to check T and notify us any fever over 100 or if other infectious type symptoms occur.  Esperanza Richters, PA-C  Follow Up Instructions:    I discussed the assessment and treatment plan with the patient. The patient was provided an opportunity to ask questions and all were answered. The patient agreed with the plan and demonstrated an understanding of the instructions.   The patient was advised to call back or seek an in-person evaluation if the symptoms worsen or if the condition fails to improve as anticipated.     Esperanza Richters, PA-C    Review of Systems See hpi.    Objective:   Physical Exam  See objetive.      Assessment & Plan:

## 2018-05-16 ENCOUNTER — Encounter: Payer: Self-pay | Admitting: Family Medicine

## 2018-05-16 ENCOUNTER — Other Ambulatory Visit: Payer: Self-pay

## 2018-05-16 ENCOUNTER — Ambulatory Visit (INDEPENDENT_AMBULATORY_CARE_PROVIDER_SITE_OTHER): Payer: Federal, State, Local not specified - PPO | Admitting: Family Medicine

## 2018-05-16 ENCOUNTER — Telehealth: Payer: Self-pay

## 2018-05-16 DIAGNOSIS — R140 Abdominal distension (gaseous): Secondary | ICD-10-CM | POA: Diagnosis not present

## 2018-05-16 DIAGNOSIS — R142 Eructation: Secondary | ICD-10-CM | POA: Diagnosis not present

## 2018-05-16 MED ORDER — PANTOPRAZOLE SODIUM 40 MG PO TBEC: 40.0000 mg | DELAYED_RELEASE_TABLET | Freq: Every day | ORAL | 1 refills | Status: DC

## 2018-05-16 MED FILL — ALBUTEROL SULFATE HFA 108 (: 108 (90 BAS | 25 days supply | Qty: 18 | Fill #1

## 2018-05-16 NOTE — Telephone Encounter (Signed)
Needs appt

## 2018-05-16 NOTE — Patient Instructions (Signed)
Continue the albuterol.  Bean-O.  Stop chewing gum, drinking carbonated beverages, gulping liquids, and drinking alcohol to help with belching.  These foods may cause you to belch more: Wheat, barley, rye, onion, leek, white part of spring onion, garlic, shallots, artichokes, beetroot, fennel, peas, chicory, pistachio, cashews, legumes, lentils, and chickpeas; Milk, custard, ice cream, and yogurt; Apples, pears, mangoes, cherries, watermelon, asparagus, sugar snap peas, honey, high-fructose corn syrup; Apricots, nectarines, peaches, plums, mushrooms, cauliflower, artificially sweetened chewing gum and confectionery  Let us know if you need anything.

## 2018-05-16 NOTE — Telephone Encounter (Signed)
Scheduled pt today with [facility].

## 2018-05-16 NOTE — Progress Notes (Signed)
Chief Complaint  Patient presents with  . Shortness of Breath    Subjective: Patient is a 58 y.o. male here for sob. Due to COVID-19 pandemic, we are interacting via web portal for an electronic face-to-face visit. I verified patient's ID using 2 identifiers. Patient agreed to proceed with visit via this method. Patient is at home, I am at office. Patient and I are present for visit.   Pt seen on 4.22 and given SABA, pred taper and ICS/LABA. SABA helps sometimes, feels abd distension over past month that he feels is pushing against his chest causing sob. Bleching more freq and passing gas. Gas-X not helpful. +stool freq, no bleeding, N/V, diarrhea, fevers, sick contacts.  ROS: GI: As noted in HPI  Past Medical History:  Diagnosis Date  . Allergy     Objective: No conversational dyspnea Age appropriate judgment and insight Nml affect and mood  Assessment and Plan: Abdominal distension - Plan: pantoprazole (PROTONIX) 40 MG tablet  Belching - Plan: pantoprazole (PROTONIX) 40 MG tablet  Trial PPI, Beano. Stay hydrated and active. Cont SABA. Eructation diet to be mailed along with letter excusing from work.  F/u prn.  The patient voiced understanding and agreement to the plan.  Jilda Roche [place], DO 05/16/18  2:56 PM

## 2018-05-16 NOTE — Telephone Encounter (Signed)
Copied from CRM [id number]. Topic: General - Other >> May 16, 2018 11:36 AM Tamela Oddi wrote: Reason for CRM: Patient called to speak with the nurse or doctor to let them know how  the medication was affecting him.  He stated that he still has pressure and the medication is not working.  Please call patient back to advise him on what he should do.  CB# [phone]

## 2018-05-23 ENCOUNTER — Inpatient Hospital Stay (HOSPITAL_BASED_OUTPATIENT_CLINIC_OR_DEPARTMENT_OTHER)
Admission: EM | Admit: 2018-05-23 | Discharge: 2018-05-27 | DRG: 286 | Disposition: A | Discharge status: Home / Self Care | Payer: Federal, State, Local not specified - PPO | Attending: Internal Medicine | Admitting: Internal Medicine

## 2018-05-23 ENCOUNTER — Encounter (HOSPITAL_BASED_OUTPATIENT_CLINIC_OR_DEPARTMENT_OTHER): Payer: Self-pay

## 2018-05-23 ENCOUNTER — Emergency Department (HOSPITAL_BASED_OUTPATIENT_CLINIC_OR_DEPARTMENT_OTHER): Payer: Federal, State, Local not specified - PPO

## 2018-05-23 ENCOUNTER — Telehealth: Payer: Self-pay

## 2018-05-23 ENCOUNTER — Other Ambulatory Visit: Payer: Self-pay

## 2018-05-23 DIAGNOSIS — Z8249 Family history of ischemic heart disease and other diseases of the circulatory system: Secondary | ICD-10-CM | POA: Diagnosis not present

## 2018-05-23 DIAGNOSIS — N179 Acute kidney failure, unspecified: Secondary | ICD-10-CM | POA: Diagnosis not present

## 2018-05-23 DIAGNOSIS — I420 Dilated cardiomyopathy: Secondary | ICD-10-CM | POA: Diagnosis present

## 2018-05-23 DIAGNOSIS — Z20828 Contact with and (suspected) exposure to other viral communicable diseases: Secondary | ICD-10-CM | POA: Diagnosis present

## 2018-05-23 DIAGNOSIS — E785 Hyperlipidemia, unspecified: Secondary | ICD-10-CM | POA: Diagnosis not present

## 2018-05-23 DIAGNOSIS — Z809 Family history of malignant neoplasm, unspecified: Secondary | ICD-10-CM | POA: Diagnosis not present

## 2018-05-23 DIAGNOSIS — Z6841 Body Mass Index (BMI) 40.0 and over, adult: Secondary | ICD-10-CM

## 2018-05-23 DIAGNOSIS — I272 Pulmonary hypertension, unspecified: Secondary | ICD-10-CM | POA: Diagnosis present

## 2018-05-23 DIAGNOSIS — Z881 Allergy status to other antibiotic agents status: Secondary | ICD-10-CM | POA: Diagnosis not present

## 2018-05-23 DIAGNOSIS — Z720 Tobacco use: Secondary | ICD-10-CM | POA: Diagnosis present

## 2018-05-23 DIAGNOSIS — E876 Hypokalemia: Secondary | ICD-10-CM | POA: Diagnosis not present

## 2018-05-23 DIAGNOSIS — I10 Essential (primary) hypertension: Secondary | ICD-10-CM | POA: Diagnosis present

## 2018-05-23 DIAGNOSIS — I248 Other forms of acute ischemic heart disease: Secondary | ICD-10-CM | POA: Diagnosis not present

## 2018-05-23 DIAGNOSIS — I5043 Acute on chronic combined systolic (congestive) and diastolic (congestive) heart failure: Secondary | ICD-10-CM | POA: Diagnosis present

## 2018-05-23 DIAGNOSIS — T502X5A Adverse effect of carbonic-anhydrase inhibitors, benzothiadiazides and other diuretics, initial encounter: Secondary | ICD-10-CM | POA: Diagnosis not present

## 2018-05-23 DIAGNOSIS — Z7951 Long term (current) use of inhaled steroids: Secondary | ICD-10-CM | POA: Diagnosis not present

## 2018-05-23 DIAGNOSIS — F17210 Nicotine dependence, cigarettes, uncomplicated: Secondary | ICD-10-CM | POA: Diagnosis present

## 2018-05-23 DIAGNOSIS — R0902 Hypoxemia: Secondary | ICD-10-CM

## 2018-05-23 DIAGNOSIS — R0602 Shortness of breath: Secondary | ICD-10-CM | POA: Diagnosis present

## 2018-05-23 DIAGNOSIS — J9601 Acute respiratory failure with hypoxia: Secondary | ICD-10-CM | POA: Diagnosis not present

## 2018-05-23 DIAGNOSIS — Z7289 Other problems related to lifestyle: Secondary | ICD-10-CM

## 2018-05-23 DIAGNOSIS — G4733 Obstructive sleep apnea (adult) (pediatric): Secondary | ICD-10-CM | POA: Diagnosis present

## 2018-05-23 DIAGNOSIS — I351 Nonrheumatic aortic (valve) insufficiency: Secondary | ICD-10-CM | POA: Diagnosis present

## 2018-05-23 DIAGNOSIS — Z789 Other specified health status: Secondary | ICD-10-CM

## 2018-05-23 DIAGNOSIS — I110 Hypertensive heart disease with heart failure: Principal | ICD-10-CM | POA: Diagnosis present

## 2018-05-23 DIAGNOSIS — I509 Heart failure, unspecified: Secondary | ICD-10-CM

## 2018-05-23 HISTORY — DX: Hyperlipidemia, unspecified: E78.5

## 2018-05-23 HISTORY — DX: Heart failure, unspecified: I50.9

## 2018-05-23 LAB — POCT I-STAT EG7
Acid-Base Excess: 2 mmol/L (ref 0.0–2.0)
Bicarbonate: 26.7 mmol/L (ref 20.0–28.0)
Calcium, Ion: 1.14 mmol/L — ABNORMAL LOW (ref 1.15–1.40)
HCT: 46 % (ref 39.0–52.0)
Hemoglobin: 15.6 g/dL (ref 13.0–17.0)
O2 Saturation: 97 %
Patient temperature: 98.4
Potassium: 4 mmol/L (ref 3.5–5.1)
Sodium: 142 mmol/L (ref 135–145)
TCO2: 28 mmol/L (ref 22–32)
pCO2, Ven: 39.6 mmHg — ABNORMAL LOW (ref 44.0–60.0)
pH, Ven: 7.437 — ABNORMAL HIGH (ref 7.250–7.430)
pO2, Ven: 88 mmHg — ABNORMAL HIGH (ref 32.0–45.0)

## 2018-05-23 LAB — CBC WITH DIFFERENTIAL/PLATELET
Abs Immature Granulocytes: 0.02 10*3/uL (ref 0.00–0.07)
Basophils Absolute: 0 10*3/uL (ref 0.0–0.1)
Basophils Relative: 1 %
Eosinophils Absolute: 0.2 10*3/uL (ref 0.0–0.5)
Eosinophils Relative: 3 %
HCT: 45 % (ref 39.0–52.0)
Hemoglobin: 14.5 g/dL (ref 13.0–17.0)
Immature Granulocytes: 0 %
Lymphocytes Relative: 22 %
Lymphs Abs: 1.6 10*3/uL (ref 0.7–4.0)
MCH: 31 pg (ref 26.0–34.0)
MCHC: 32.2 g/dL (ref 30.0–36.0)
MCV: 96.4 fL (ref 80.0–100.0)
Monocytes Absolute: 0.5 10*3/uL (ref 0.1–1.0)
Monocytes Relative: 7 %
Neutro Abs: 4.7 10*3/uL (ref 1.7–7.7)
Neutrophils Relative %: 67 %
Platelets: 208 10*3/uL (ref 150–400)
RBC: 4.67 MIL/uL (ref 4.22–5.81)
RDW: 15.8 % — ABNORMAL HIGH (ref 11.5–15.5)
WBC: 7 10*3/uL (ref 4.0–10.5)
nRBC: 0 % (ref 0.0–0.2)

## 2018-05-23 LAB — SARS CORONAVIRUS 2 AG (30 MIN TAT): SARS Coronavirus 2 Ag: NEGATIVE

## 2018-05-23 LAB — COMPREHENSIVE METABOLIC PANEL
ALT: 17 U/L (ref 0–44)
AST: 16 U/L (ref 15–41)
Albumin: 3.8 g/dL (ref 3.5–5.0)
Alkaline Phosphatase: 58 U/L (ref 38–126)
Anion gap: 6 (ref 5–15)
BUN: 11 mg/dL (ref 6–20)
CO2: 27 mmol/L (ref 22–32)
Calcium: 8.8 mg/dL — ABNORMAL LOW (ref 8.9–10.3)
Chloride: 108 mmol/L (ref 98–111)
Creatinine, Ser: 0.86 mg/dL (ref 0.61–1.24)
GFR calc Af Amer: >60 mL/min (ref >60)
GFR calc non Af Amer: >60 mL/min (ref >60)
Glucose, Bld: 107 mg/dL — ABNORMAL HIGH (ref 70–99)
Potassium: 3.9 mmol/L (ref 3.5–5.1)
Sodium: 141 mmol/L (ref 135–145)
Total Bilirubin: 1.7 mg/dL — ABNORMAL HIGH (ref 0.3–1.2)
Total Protein: 7 g/dL (ref 6.5–8.1)

## 2018-05-23 LAB — URINALYSIS, ROUTINE W REFLEX MICROSCOPIC
Bilirubin Urine: NEGATIVE
Glucose, UA: NEGATIVE mg/dL
Hgb urine dipstick: NEGATIVE
Ketones, ur: NEGATIVE mg/dL
Leukocytes,Ua: NEGATIVE
Nitrite: NEGATIVE
Protein, ur: NEGATIVE mg/dL
Specific Gravity, Urine: 1.02 (ref 1.005–1.030)
pH: 6 (ref 5.0–8.0)

## 2018-05-23 LAB — LACTIC ACID, PLASMA: Lactic Acid, Venous: 0.9 mmol/L (ref 0.5–1.9)

## 2018-05-23 LAB — PROTIME-INR
INR: 1 (ref 0.8–1.2)
Prothrombin Time: 13.3 seconds (ref 11.4–15.2)

## 2018-05-23 LAB — MRSA PCR SCREENING: MRSA by PCR: NEGATIVE

## 2018-05-23 LAB — BRAIN NATRIURETIC PEPTIDE: B Natriuretic Peptide: 310.3 pg/mL — ABNORMAL HIGH (ref 0.0–100.0)

## 2018-05-23 LAB — TROPONIN I: Troponin I: 0.04 ng/mL (ref <0.03)

## 2018-05-23 LAB — LIPASE, BLOOD: Lipase: 34 U/L (ref 11–51)

## 2018-05-23 MED ORDER — FUROSEMIDE 10 MG/ML IJ SOLN
40.0000 mg | Freq: Once | INTRAMUSCULAR | Status: AC
  Administered 2018-05-23: 17:00:00 40 mg via INTRAVENOUS
  Filled 2018-05-23: qty 4

## 2018-05-23 MED ORDER — ASPIRIN 81 MG PO CHEW
324.0000 mg | CHEWABLE_TABLET | Freq: Once | ORAL | Status: AC
  Administered 2018-05-23: 17:00:00 324 mg via ORAL
  Filled 2018-05-23: qty 4

## 2018-05-23 NOTE — ED Notes (Signed)
Patient transported to X-ray 

## 2018-05-23 NOTE — Telephone Encounter (Signed)
Pt's wife called today states pt is still SOB. Pt has little  cough no fever. Pt wife states pt has had 3 vov and wanted to know if they should just go to the hospital.  I advised pt's wife to take pt to ED Per Ramon Dredge .

## 2018-05-23 NOTE — ED Notes (Signed)
ED Provider at bedside. 

## 2018-05-23 NOTE — ED Notes (Signed)
Pt wife updated with plan of care.

## 2018-05-23 NOTE — ED Provider Notes (Addendum)
[facility] EMERGENCY DEPARTMENT Provider Note   CSN: [id number] Arrival date & time: 05/23/18  1514    History   Chief Complaint Chief Complaint  Patient presents with  . Shortness of Breath    HPI Drew [name] is a 58 y.o. male.     HPI Patient reports he has had problems with shortness of breath with exertion for almost 3 weeks.  He reports it has gotten much worse now and he can only walk about 10 steps before he has to take a break and catch his breath.  He reports over the past couple days now he also has developed swelling in his legs that he had not previously had.  He denies he specifically having chest pain but reports when it gets hard to breathe it does feel like he is really, really worn out.  No fevers, no cough, no vomiting, no diarrhea.  No generalized myalgia.  Patient reports he is about a pack per day smoker.  He reports he had an episode where he was pretty short of breath for a while a couple of months ago but he got treated for bronchitis and got better.  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  He reports now he has had to take multiple days out of work because he is too short of breath and fatigue with exertion to even do his job.  No known history of heart problems or congestive heart failure. Past Medical History:  Diagnosis Date  . Allergy     Patient Active Problem List   Diagnosis Date Noted  . Wellness examination 02/26/2014  . Knee pain, left 02/11/2014  . Seasonal allergies 02/11/2014  . Smoker 02/11/2014  . Elevated blood pressure 02/11/2014    Past Surgical History:  Procedure Laterality Date  . KNEE ARTHROSCOPY  2009  . THROAT SURGERY  2005        Home Medications    Prior to Admission medications   Medication Sig Start Date End Date Taking? Authorizing Provider  albuterol (PROVENTIL HFA;VENTOLIN HFA) 108 (90 Base) MCG/ACT inhaler Inhale 2 puffs into the lungs every 6  (six) hours as needed for wheezing or shortness of breath. 11/29/17   Saguier, Ramon Dredge, PA-C  budesonide-formoterol (SYMBICORT) 80-4.5 MCG/ACT inhaler Inhale 2 puffs into the lungs 2 (two) times daily. 05/07/18   Saguier, Ramon Dredge, PA-C  buPROPion (WELLBUTRIN XL) 150 MG 24 hr tablet Take 1 tablet (150 mg total) by mouth daily. 12/17/17   Saguier, Ramon Dredge, PA-C  carvedilol (COREG) 3.125 MG tablet Take 1 tablet (3.125 mg total) by mouth 2 (two) times daily with a meal. 12/17/17   Saguier, Ramon Dredge, PA-C  pantoprazole (PROTONIX) 40 MG tablet Take 1 tablet (40 mg total) by mouth daily. 05/16/18   Sharlene Dory, DO  predniSONE (STERAPRED UNI-PAK 21 TAB) 10 MG (21) TBPK tablet Taper as directed 05/07/18   Saguier, Ramon Dredge, PA-C  rosuvastatin (CRESTOR) 10 MG tablet Take 1 tablet (10 mg total) by mouth daily. 12/17/17   Saguier, Ramon Dredge, PA-C    Family History No family history on file.  Social History Social History   Tobacco Use  . Smoking status: Current Every Day Smoker    Packs/day: 1.00    Years: 20.00    Pack years: 20.00    Types: Cigarettes  . Smokeless tobacco: Never Used  Substance Use Topics  . Alcohol use: Yes    Alcohol/week: 0.0 standard drinks  Comment: 6 pack a week spread out over weekend  . Drug use: No     Allergies   Tetracyclines & related   Review of Systems Review of Systems 10 Systems reviewed and are negative for acute change except as noted in the HPI.   Physical Exam Updated Vital Signs BP 134/88   Pulse 87   Temp 98.3 F (36.8 C) (Oral)   Resp (!) 26   Ht 5\' 11"  (1.803 m)   Wt (!) 148.3 kg   SpO2 96%   BMI 45.61 kg/m   Physical Exam Constitutional:      Comments: Patient is alert and nontoxic.  No respiratory distress at rest.  Central obesity.  HENT:     Head: Normocephalic and atraumatic.     Mouth/Throat:     Mouth: Mucous membranes are moist.     Pharynx: Oropharynx is clear.  Eyes:     Extraocular Movements: Extraocular movements  intact.  Cardiovascular:     Rate and Rhythm: Normal rate and regular rhythm.  Pulmonary:     Comments: No respiratory distress at rest.  Crackles at the bases. Abdominal:     General: There is no distension.     Palpations: Abdomen is soft.     Tenderness: There is no abdominal tenderness. There is no guarding.  Musculoskeletal:     Comments: 2+ pitting edema bilateral lower extremities.  (Patient reports a swelling in his left lower leg is somewhat chronic due to old injury as opposed to right leg which he perceived to be new swelling.)  No calf tenderness.  Skin:    General: Skin is warm and dry.  Neurological:     General: No focal deficit present.     Mental Status: He is oriented to person, place, and time.     Motor: No weakness.     Coordination: Coordination normal.  Psychiatric:        Mood and Affect: Mood normal.      ED Treatments / Results  Labs (all labs ordered are listed, but only abnormal results are displayed) Labs Reviewed  BRAIN NATRIURETIC PEPTIDE - Abnormal; Notable for the following components:      Result Value   B Natriuretic Peptide 310.3 (*)    All other components within normal limits  TROPONIN I - Abnormal; Notable for the following components:   Troponin I 0.04 (*)    All other components within normal limits  CBC WITH DIFFERENTIAL/PLATELET - Abnormal; Notable for the following components:   RDW 15.8 (*)    All other components within normal limits  COMPREHENSIVE METABOLIC PANEL - Abnormal; Notable for the following components:   Glucose, Bld 107 (*)    Calcium 8.8 (*)    Total Bilirubin 1.7 (*)    All other components within normal limits  POCT I-STAT EG7 - Abnormal; Notable for the following components:   pH, Ven 7.437 (*)    pCO2, Ven 39.6 (*)    pO2, Ven 88.0 (*)    Calcium, Ion 1.14 (*)    All other components within normal limits  SARS CORONAVIRUS 2 (HOSP ORDER, PERFORMED IN [place] [facility] VIA [facility] ID)  LACTIC ACID, PLASMA   PROTIME-INR  URINALYSIS, ROUTINE W REFLEX MICROSCOPIC  LIPASE, BLOOD  BLOOD GAS, VENOUS    EKG EKG Interpretation  Date/Time:  Friday May 23 2018 15:40:07 EDT Ventricular Rate:  97 PR Interval:    QRS Duration: 101 QT Interval:  371 QTC Calculation: 472 R  Axis:   67 Text Interpretation:  Sinus rhythm Baseline wander in lead(s) II aVF borderline T wave abnormality. no old comparison Confirmed by Arby Barrette [phone]) on 05/23/2018 6:23:05 PM   Radiology Dg Chest 2 View  Result Date: 05/23/2018 CLINICAL DATA:  58 year old male with a history of shortness of breath EXAM: CHEST - 2 VIEW COMPARISON:  12/17/2017 FINDINGS: Cardiomediastinal silhouette unchanged in size and contour. Hazy mixed interstitial and airspace opacities of the bilateral lungs. Blunting of the costophrenic sulcus on the lateral view. No pneumothorax. IMPRESSION: Hazy mixed interstitial airspace opacities the bilateral lungs, with the differential including both pulmonary edema and multifocal infection. Electronically Signed   By: Gilmer Mor D.O.   On: 05/23/2018 16:22    Procedures Procedures (including critical care time) CRITICAL CARE Performed by: Arby Barrette   Total critical care time: 30 minutes  Critical care time was exclusive of separately billable procedures and treating other patients.  Critical care was necessary to treat or prevent imminent or life-threatening deterioration.  Critical care was time spent personally by me on the following activities: development of treatment plan with patient and/or surrogate as well as nursing, discussions with consultants, evaluation of patient's response to treatment, examination of patient, obtaining history from patient or surrogate, ordering and performing treatments and interventions, ordering and review of laboratory studies, ordering and review of radiographic studies, pulse oximetry and re-evaluation of patient's condition. Medications Ordered in ED  Medications  aspirin chewable tablet 324 mg (324 mg Oral Given 05/23/18 1645)  furosemide (LASIX) injection 40 mg (40 mg Intravenous Given 05/23/18 1645)     Initial Impression / Assessment and Plan / ED Course  I have reviewed the triage vital signs and the nursing notes.  Pertinent labs & imaging results that were available during my care of the patient were reviewed by me and considered in my medical decision making (see chart for details).  Clinical Course as of May 22 1940  Fri May 23, 2018  1941 Consult: Reviewed with Dr. Gloris Ham for admission.   [MP]    Clinical Course User Index [MP] Arby Barrette, MD      Patient with increasing shortness of breath for 3 weeks.  Worse x2 days.  Hypoxic to mid 80s on room air after ambulating into ED.  No history of oxygen dependence.  Chest x-ray shows vascular congestion and physical exam is consistent.  Patient has no fever or cough history.  Coronavirus testing negative.  No suspicion for coronavirus as etiology of symptoms.  Lasix and aspirin administered.  Plan for admission.  Final Clinical Impressions(s) / ED Diagnoses   Final diagnoses:  Acute congestive heart failure, unspecified heart failure type [facility])  Hypoxia    ED Discharge Orders    None       Arby Barrette, MD 05/23/18 Lauretta Chester    Arby Barrette, MD 05/23/18 1942

## 2018-05-23 NOTE — ED Notes (Signed)
Pt provided meal

## 2018-05-23 NOTE — ED Notes (Signed)
Pt asked to provide urine sample  

## 2018-05-23 NOTE — ED Notes (Signed)
Report given to 2C 

## 2018-05-23 NOTE — ED Triage Notes (Addendum)
Pt c/o SOB since November 2019 with multiple MD visits-states worse x 2 weeks-labored breathing noted when walked in to triage-pt states DOE and is noted-O2 sat mid 80s after walking to triage-up to low 90s after seated

## 2018-05-23 NOTE — ED Notes (Signed)
[facility] arrived to transport pt to [facility] 

## 2018-05-24 ENCOUNTER — Encounter (HOSPITAL_COMMUNITY): Payer: Self-pay

## 2018-05-24 ENCOUNTER — Inpatient Hospital Stay (HOSPITAL_COMMUNITY): Payer: Federal, State, Local not specified - PPO

## 2018-05-24 DIAGNOSIS — F1090 Alcohol use, unspecified, uncomplicated: Secondary | ICD-10-CM

## 2018-05-24 DIAGNOSIS — I110 Hypertensive heart disease with heart failure: Secondary | ICD-10-CM | POA: Diagnosis present

## 2018-05-24 DIAGNOSIS — I272 Pulmonary hypertension, unspecified: Secondary | ICD-10-CM | POA: Diagnosis present

## 2018-05-24 DIAGNOSIS — Z7289 Other problems related to lifestyle: Secondary | ICD-10-CM

## 2018-05-24 DIAGNOSIS — E782 Mixed hyperlipidemia: Secondary | ICD-10-CM | POA: Diagnosis not present

## 2018-05-24 DIAGNOSIS — I420 Dilated cardiomyopathy: Secondary | ICD-10-CM | POA: Diagnosis present

## 2018-05-24 DIAGNOSIS — E876 Hypokalemia: Secondary | ICD-10-CM | POA: Diagnosis not present

## 2018-05-24 DIAGNOSIS — E785 Hyperlipidemia, unspecified: Secondary | ICD-10-CM | POA: Diagnosis present

## 2018-05-24 DIAGNOSIS — Z7951 Long term (current) use of inhaled steroids: Secondary | ICD-10-CM | POA: Diagnosis not present

## 2018-05-24 DIAGNOSIS — G4733 Obstructive sleep apnea (adult) (pediatric): Secondary | ICD-10-CM | POA: Diagnosis present

## 2018-05-24 DIAGNOSIS — Z720 Tobacco use: Secondary | ICD-10-CM

## 2018-05-24 DIAGNOSIS — R7989 Other specified abnormal findings of blood chemistry: Secondary | ICD-10-CM | POA: Diagnosis not present

## 2018-05-24 DIAGNOSIS — Z809 Family history of malignant neoplasm, unspecified: Secondary | ICD-10-CM | POA: Diagnosis not present

## 2018-05-24 DIAGNOSIS — Z6841 Body Mass Index (BMI) 40.0 and over, adult: Secondary | ICD-10-CM | POA: Diagnosis not present

## 2018-05-24 DIAGNOSIS — F17210 Nicotine dependence, cigarettes, uncomplicated: Secondary | ICD-10-CM | POA: Diagnosis present

## 2018-05-24 DIAGNOSIS — Z20828 Contact with and (suspected) exposure to other viral communicable diseases: Secondary | ICD-10-CM | POA: Diagnosis present

## 2018-05-24 DIAGNOSIS — I509 Heart failure, unspecified: Secondary | ICD-10-CM

## 2018-05-24 DIAGNOSIS — I429 Cardiomyopathy, unspecified: Secondary | ICD-10-CM | POA: Diagnosis not present

## 2018-05-24 DIAGNOSIS — Z881 Allergy status to other antibiotic agents status: Secondary | ICD-10-CM | POA: Diagnosis not present

## 2018-05-24 DIAGNOSIS — N179 Acute kidney failure, unspecified: Secondary | ICD-10-CM | POA: Diagnosis not present

## 2018-05-24 DIAGNOSIS — R0602 Shortness of breath: Secondary | ICD-10-CM | POA: Diagnosis present

## 2018-05-24 DIAGNOSIS — Z8249 Family history of ischemic heart disease and other diseases of the circulatory system: Secondary | ICD-10-CM | POA: Diagnosis not present

## 2018-05-24 DIAGNOSIS — I340 Nonrheumatic mitral (valve) insufficiency: Secondary | ICD-10-CM

## 2018-05-24 DIAGNOSIS — I248 Other forms of acute ischemic heart disease: Secondary | ICD-10-CM | POA: Diagnosis present

## 2018-05-24 DIAGNOSIS — E66813 Obesity, class 3: Secondary | ICD-10-CM

## 2018-05-24 DIAGNOSIS — I5043 Acute on chronic combined systolic (congestive) and diastolic (congestive) heart failure: Secondary | ICD-10-CM | POA: Diagnosis present

## 2018-05-24 DIAGNOSIS — I361 Nonrheumatic tricuspid (valve) insufficiency: Secondary | ICD-10-CM

## 2018-05-24 DIAGNOSIS — I10 Essential (primary) hypertension: Secondary | ICD-10-CM | POA: Diagnosis present

## 2018-05-24 DIAGNOSIS — J9601 Acute respiratory failure with hypoxia: Secondary | ICD-10-CM | POA: Diagnosis present

## 2018-05-24 DIAGNOSIS — T502X5A Adverse effect of carbonic-anhydrase inhibitors, benzothiadiazides and other diuretics, initial encounter: Secondary | ICD-10-CM | POA: Diagnosis not present

## 2018-05-24 DIAGNOSIS — Z789 Other specified health status: Secondary | ICD-10-CM

## 2018-05-24 DIAGNOSIS — I5021 Acute systolic (congestive) heart failure: Secondary | ICD-10-CM | POA: Diagnosis not present

## 2018-05-24 DIAGNOSIS — I351 Nonrheumatic aortic (valve) insufficiency: Secondary | ICD-10-CM | POA: Diagnosis present

## 2018-05-24 HISTORY — DX: Essential (primary) hypertension: I10

## 2018-05-24 HISTORY — DX: Obesity, class 3: E66.813

## 2018-05-24 HISTORY — DX: Hyperlipidemia, unspecified: E78.5

## 2018-05-24 HISTORY — DX: Heart failure, unspecified: I50.9

## 2018-05-24 HISTORY — DX: Alcohol use, unspecified, uncomplicated: F10.90

## 2018-05-24 HISTORY — DX: Tobacco use: Z72.0

## 2018-05-24 HISTORY — DX: Other problems related to lifestyle: Z72.89

## 2018-05-24 HISTORY — DX: Morbid (severe) obesity due to excess calories: E66.01

## 2018-05-24 HISTORY — DX: Other specified health status: Z78.9

## 2018-05-24 LAB — TROPONIN I
Troponin I: 0.03 ng/mL (ref <0.03)
Troponin I: 0.03 ng/mL (ref <0.03)
Troponin I: 0.03 ng/mL (ref <0.03)

## 2018-05-24 LAB — TSH: TSH: 2.453 u[IU]/mL (ref 0.350–4.500)

## 2018-05-24 LAB — ECHOCARDIOGRAM COMPLETE
Height: 71 in
Weight: 5093.51 oz

## 2018-05-24 MED ORDER — FUROSEMIDE 10 MG/ML IJ SOLN
60.0000 mg | Freq: Two times a day (BID) | INTRAMUSCULAR | Status: DC
  Administered 2018-05-24 – 2018-05-25 (×4): 60 mg via INTRAVENOUS
  Filled 2018-05-24 (×4): qty 6

## 2018-05-24 MED ORDER — HYDRALAZINE HCL 20 MG/ML IJ SOLN: 10.0000 mg | Freq: Four times a day (QID) | INTRAMUSCULAR | Status: DC | PRN

## 2018-05-24 MED ORDER — ALBUTEROL SULFATE (2.5 MG/3ML) 0.083% IN NEBU: 2.5000 mg | INHALATION_SOLUTION | RESPIRATORY_TRACT | Status: DC | PRN

## 2018-05-24 MED ORDER — ACETAMINOPHEN 325 MG PO TABS: 650.0000 mg | ORAL_TABLET | Freq: Four times a day (QID) | ORAL | Status: DC | PRN

## 2018-05-24 MED ORDER — SODIUM CHLORIDE 0.9% FLUSH
3.0000 mL | Freq: Two times a day (BID) | INTRAVENOUS | Status: DC
  Administered 2018-05-24 – 2018-05-27 (×5): 3 mL via INTRAVENOUS

## 2018-05-24 MED ORDER — AMLODIPINE BESYLATE 10 MG PO TABS
10.0000 mg | ORAL_TABLET | Freq: Every day | ORAL | Status: DC
  Administered 2018-05-24 – 2018-05-25 (×2): 10 mg via ORAL
  Filled 2018-05-24 (×2): qty 1

## 2018-05-24 MED ORDER — ACETAMINOPHEN 650 MG RE SUPP: 650.0000 mg | Freq: Four times a day (QID) | RECTAL | Status: DC | PRN

## 2018-05-24 MED ORDER — SENNOSIDES-DOCUSATE SODIUM 8.6-50 MG PO TABS: 1.0000 | ORAL_TABLET | Freq: Every evening | ORAL | Status: DC | PRN

## 2018-05-24 MED ORDER — ENOXAPARIN SODIUM 40 MG/0.4ML ~~LOC~~ SOLN
40.0000 mg | SUBCUTANEOUS | Status: DC
  Administered 2018-05-24 – 2018-05-25 (×2): 40 mg via SUBCUTANEOUS
  Filled 2018-05-24 (×2): qty 0.4

## 2018-05-24 NOTE — Progress Notes (Signed)
Addendum  Abnormal TTE results as below appreciated.  Cardiology was consulted.  SUMMARY   Severe biventricular dilatation with severe LV systolic dysfunction, estimated LVEF 20-25% with diffuse hypokinesis, grade 2 diastolic dysfunction and elevated filling pressures. RVEF is moderately decreased. No prior echocardiogram available for comparison.  Patient has acute combined systolic and diastolic CHF.  Marcellus Scott, MD, FACP, [facility]. [facility]  To contact the attending provider between 7A-7P or the covering provider during after hours 7P-7A, please log into the web site [email or web address] and access using universal [place] password for that web site. If you do not have the password, please call the hospital operator.

## 2018-05-24 NOTE — Plan of Care (Signed)

## 2018-05-24 NOTE — Plan of Care (Signed)
  Problem: Education: Goal: Knowledge of General Education information will improve Description Including pain rating scale, medication(s)/side effects and non-pharmacologic comfort measures 05/24/2018 0210 by Excell Seltzer, Reford Olliff D, RN Outcome: Progressing 05/24/2018 0155 by Excell Seltzer, Lauree Yurick D, RN Outcome: Progressing   Problem: Health Behavior/Discharge Planning: Goal: Ability to manage health-related needs will improve 05/24/2018 0210 by Excell Seltzer, Aditya Nastasi D, RN Outcome: Progressing 05/24/2018 0155 by Excell Seltzer, Duron Meister D, RN Outcome: Progressing   Problem: Clinical Measurements: Goal: Ability to maintain clinical measurements within normal limits will improve 05/24/2018 0210 by Excell Seltzer, Riann Oman D, RN Outcome: Progressing 05/24/2018 0155 by Excell Seltzer, Saydi Kobel D, RN Outcome: Progressing Goal: Will remain free from infection 05/24/2018 0210 by Excell Seltzer, Esli Jernigan D, RN Outcome: Progressing 05/24/2018 0155 by Excell Seltzer, Kohler Pellerito D, RN Outcome: Progressing Goal: Diagnostic test results will improve 05/24/2018 0210 by Excell Seltzer, Syrita Dovel D, RN Outcome: Progressing 05/24/2018 0155 by Excell Seltzer, Cleve Paolillo D, RN Outcome: Progressing Goal: Respiratory complications will improve 05/24/2018 0210 by Excell Seltzer, Sadhana Frater D, RN Outcome: Progressing 05/24/2018 0155 by Excell Seltzer, Noely Kuhnle D, RN Outcome: Progressing Goal: Cardiovascular complication will be avoided 05/24/2018 0210 by Billey Gosling D, RN Outcome: Progressing 05/24/2018 0155 by Billey Gosling D, RN Outcome: Progressing   Problem: Activity: Goal: Risk for activity intolerance will decrease 05/24/2018 0210 by Billey Gosling D, RN Outcome: Progressing 05/24/2018 0155 by Excell Seltzer, Leili Eskenazi D, RN Outcome: Progressing   Problem: Nutrition: Goal: Adequate nutrition will be maintained 05/24/2018 0210 by Excell Seltzer, Torey Reinard D, RN Outcome: Progressing 05/24/2018 0155 by Excell Seltzer, Jame Morrell D, RN Outcome: Progressing   Problem: Coping: Goal: Level of anxiety will decrease 05/24/2018 0210 by Excell Seltzer, Rosemond Lyttle D, RN Outcome:  Progressing 05/24/2018 0155 by Excell Seltzer, Izear Pine D, RN Outcome: Progressing   Problem: Elimination: Goal: Will not experience complications related to bowel motility 05/24/2018 0210 by Excell Seltzer, Reynaldo Rossman D, RN Outcome: Progressing 05/24/2018 0155 by Excell Seltzer, Sholonda Jobst D, RN Outcome: Progressing Goal: Will not experience complications related to urinary retention 05/24/2018 0210 by Excell Seltzer, Jasenia Weilbacher D, RN Outcome: Progressing 05/24/2018 0155 by Excell Seltzer, Tniya Bowditch D, RN Outcome: Progressing   Problem: Pain Managment: Goal: General experience of comfort will improve 05/24/2018 0210 by Excell Seltzer, Quintessa Simmerman D, RN Outcome: Progressing 05/24/2018 0155 by Excell Seltzer, Ruthellen Tippy D, RN Outcome: Progressing   Problem: Safety: Goal: Ability to remain free from injury will improve 05/24/2018 0210 by Excell Seltzer, Sanjuana Mruk D, RN Outcome: Progressing 05/24/2018 0155 by Excell Seltzer, Laketia Vicknair D, RN Outcome: Progressing   Problem: Skin Integrity: Goal: Risk for impaired skin integrity will decrease 05/24/2018 0210 by Excell Seltzer, Vernisha Bacote D, RN Outcome: Progressing 05/24/2018 0155 by Excell Seltzer, Verbena Boeding D, RN Outcome: Progressing   Problem: Education: Goal: Ability to demonstrate management of disease process will improve 05/24/2018 0210 by Excell Seltzer, Makayleigh Poliquin D, RN Outcome: Progressing 05/24/2018 0155 by Excell Seltzer, Obbie Lewallen D, RN Outcome: Progressing Goal: Ability to verbalize understanding of medication therapies will improve 05/24/2018 0210 by Excell Seltzer, Lakely Elmendorf D, RN Outcome: Progressing 05/24/2018 0155 by Billey Gosling D, RN Outcome: Progressing Goal: Individualized Educational Video(s) 05/24/2018 0210 by Excell Seltzer, Bryla Burek D, RN Outcome: Progressing 05/24/2018 0155 by Billey Gosling D, RN Outcome: Progressing   Problem: Activity: Goal: Capacity to carry out activities will improve 05/24/2018 0210 by Excell Seltzer, Darien Mignogna D, RN Outcome: Progressing 05/24/2018 0155 by Excell Seltzer, Shareta Fishbaugh D, RN Outcome: Progressing   Problem: Cardiac: Goal: Ability to achieve and maintain adequate cardiopulmonary perfusion will  improve 05/24/2018 0210 by Billey Gosling D, RN Outcome: Progressing 05/24/2018 0155 by Excell Seltzer Dharma Pare D, RN Outcome: Progressing

## 2018-05-24 NOTE — H&P (Signed)
History and Physical    Drew [name] UOH:[id number] DOB: Feb 04, 1960 DOA: 05/23/2018  PCP: Marisue Brooklyn   I have briefly reviewed patients previous medical reports in [facility].  Patient coming from: Home  Chief Complaint: Worsening dyspnea and leg swelling  HPI: Drew [name] is a pleasant 58 year old married male, lives with spouse and family, independent, [employer] employee, PMH of HTN, HLD, tobacco abuse, alcohol use, obesity, initially presented to [facility] ED on 05/23/2018 due to 3 weeks history of progressively worsening dyspnea and leg swelling.  He was then transferred to [facility] for further evaluation and management.  He reports that he was told to have high blood pressure, was on an antihypertensive which he took for a brief period and then never continued.  Similarly was on medications for HLD end of last year which he did not continue.  Patient developed dyspnea on exertion approximately 3 weeks ago which has progressively worsened especially over the last couple of days to a point where he can barely walk a few feet and has to stop and rest.  This is associated with orthopnea, PND but no cough, chest pain, fever or chills.  He has associated swelling of both legs.  Left leg has chronic swelling related to prior surgery.  He has been off of work for the last 2 weeks due to his symptoms.  No history of recent long distance travel.  No wheezing.  He also reports abdominal distention/stiffness without pain, nausea or vomiting.  Was seen by his PCP on 5/1 for same complaints via E visit and treated for abdominal distention and dyspnea with Protonix, SABA which apparently did not help much.  ED Course: At [facility] ED, lab work significant for VBG with pH of 7.44, PCO2 39.6 and PO2 88, CMP only significant for total bilirubin of 1.7, BNP 310, troponin 0 0.04, lactate 0.9, normal CBC, chest x-ray suggestive of pulmonary edema.  He was also hypoxic to the mid 80s on room air after  ambulating in the ED. SARS coronavirus 2 testing negative.  He was given aspirin and a dose of Lasix 40 mg IV x1 and transferred to [facility].  He indicates that since the dose of Lasix he urinated quite well and his abdominal distention feels better.  Dyspnea is slightly improved but breathing not close to baseline.  Review of Systems:  All other systems reviewed and apart from HPI, are negative.  Past Medical History:  Diagnosis Date  . Allergy   . Hyperlipidemia 05/24/2018    Past Surgical History:  Procedure Laterality Date  . KNEE ARTHROSCOPY  2009  . THROAT SURGERY  2005    Social History  reports that he has been smoking cigarettes. He has a 20.00 pack-year smoking history. He has never used smokeless tobacco. He reports current alcohol use. He reports that he does not use drugs.   Patient reports smoking up to a pack per day for the last 30 years but since onset of illness a pack of cigarettes last him 3 days.  He reports drinking couple of glasses/exact amount undetermined of whiskey up to 3 times per week and the last time was 6 days PTA.  He denies drug abuse.  Allergies  Allergen Reactions  . Tetracyclines & Related Hives and Itching    Family History  Problem Relation Age of Onset  . Heart failure Other   Parents alive and reportedly without medical illness.  Indicates that her cousin has heart failure.   Prior  to Admission medications   Medication Sig Start Date End Date Taking? Authorizing Provider  albuterol (PROVENTIL HFA;VENTOLIN HFA) 108 (90 Base) MCG/ACT inhaler Inhale 2 puffs into the lungs every 6 (six) hours as needed for wheezing or shortness of breath. 11/29/17  Yes Saguier, Ramon Dredge, PA-C  pantoprazole (PROTONIX) 40 MG tablet Take 1 tablet (40 mg total) by mouth daily. 05/16/18  Yes Sharlene Dory, DO  rosuvastatin (CRESTOR) 10 MG tablet Take 1 tablet (10 mg total) by mouth daily. 12/17/17  Yes Saguier, Ramon Dredge, PA-C  budesonide-formoterol (SYMBICORT)  80-4.5 MCG/ACT inhaler Inhale 2 puffs into the lungs 2 (two) times daily. Patient not taking: Reported on 05/24/2018 05/07/18   Saguier, Ramon Dredge, PA-C  buPROPion (WELLBUTRIN XL) 150 MG 24 hr tablet Take 1 tablet (150 mg total) by mouth daily. 12/17/17   Saguier, Ramon Dredge, PA-C  carvedilol (COREG) 3.125 MG tablet Take 1 tablet (3.125 mg total) by mouth 2 (two) times daily with a meal. Patient not taking: Reported on 05/24/2018 12/17/17   Esperanza Richters, PA-C    Physical Exam: Vitals:   05/24/18 0300 05/24/18 0420 05/24/18 0730 05/24/18 0942  BP:  (!) 158/100 (!) 159/109 (!) 161/115  Pulse:  86    Resp:  (!) 25    Temp: 97.7 F (36.5 C) 97.7 F (36.5 C) 97.7 F (36.5 C)   TempSrc:  Oral Oral   SpO2:  94% 93%   Weight:      Height:          Constitutional: Pleasant young male, moderately built and morbidly obese lying propped up in bed and noted to have some dyspnea even while talking. Eyes: PERTLA, lids and conjunctivae normal ENMT: Mucous membranes are moist. Posterior pharynx clear of any exudate or lesions. Normal dentition.  Neck: supple, no masses, no thyromegaly Respiratory: Diminished breath sounds in the bases with few bibasal crackles.  Rest of lung fields clear to auscultation without wheezing or rhonchi.  Mild increased work of breathing while speaking. Cardiovascular: S1 & S2 heard, regular rate and rhythm, no murmurs / rubs / gallops. 2+ pedal pulses. No carotid bruits.  2+ pitting bilateral leg edema.  JVD +. Abdomen: No distension/truncal obesity, no tenderness, no masses palpated. No hepatosplenomegaly. Bowel sounds normal.  Musculoskeletal: no clubbing / cyanosis. No joint deformity upper and lower extremities. Good ROM, no contractures. Normal muscle tone.  Skin: no rashes, lesions, ulcers. No induration Neurologic: CN 2-12 grossly intact. Sensation intact, DTR normal. Strength 5/5 in all 4 limbs.  Psychiatric: Normal judgment and insight. Alert and oriented x 3. Normal  mood.     Labs on Admission: I have personally reviewed following labs and imaging studies  CBC: Recent Labs  Lab 05/23/18 1536 05/23/18 1605  WBC 7.0  --   NEUTROABS 4.7  --   HGB 14.5 15.6  HCT 45.0 46.0  MCV 96.4  --   PLT 208  --    Basic Metabolic Panel: Recent Labs  Lab 05/23/18 1605 05/23/18 1732  NA 142 141  K 4.0 3.9  CL  --  108  CO2  --  27  GLUCOSE  --  107*  BUN  --  11  CREATININE  --  0.86  CALCIUM  --  8.8*   Liver Function Tests: Recent Labs  Lab 05/23/18 1732  AST 16  ALT 17  ALKPHOS 58  BILITOT 1.7*  PROT 7.0  ALBUMIN 3.8   Coagulation Profile: Recent Labs  Lab 05/23/18 1536  INR 1.0  Cardiac Enzymes: Recent Labs  Lab 05/23/18 1536  TROPONINI 0.04*   Urine analysis:    Component Value Date/Time   COLORURINE YELLOW 05/23/2018 1709   APPEARANCEUR CLEAR 05/23/2018 1709   LABSPEC 1.020 05/23/2018 1709   PHURINE 6.0 05/23/2018 1709   GLUCOSEU NEGATIVE 05/23/2018 1709   HGBUR NEGATIVE 05/23/2018 1709   BILIRUBINUR NEGATIVE 05/23/2018 1709   BILIRUBINUR Negative 12/18/2017 0955   KETONESUR NEGATIVE 05/23/2018 1709   PROTEINUR NEGATIVE 05/23/2018 1709   UROBILINOGEN negative (A) 12/18/2017 0955   NITRITE NEGATIVE 05/23/2018 1709   LEUKOCYTESUR NEGATIVE 05/23/2018 1709     Radiological Exams on Admission: Dg Chest 2 View  Result Date: 05/23/2018 CLINICAL DATA:  58 year old male with a history of shortness of breath EXAM: CHEST - 2 VIEW COMPARISON:  12/17/2017 FINDINGS: Cardiomediastinal silhouette unchanged in size and contour. Hazy mixed interstitial and airspace opacities of the bilateral lungs. Blunting of the costophrenic sulcus on the lateral view. No pneumothorax. IMPRESSION: Hazy mixed interstitial airspace opacities the bilateral lungs, with the differential including both pulmonary edema and multifocal infection. Electronically Signed   By: Gilmer Mor D.O.   On: 05/23/2018 16:22    EKG: Independently reviewed.   Sinus rhythm at 97 bpm, normal axis,?  BBB morphology, no acute changes and QTC 472 ms.  Assessment/Plan Principal Problem:   New onset of congestive heart failure (HCC) Active Problems:   Essential hypertension   Hyperlipidemia   Tobacco abuse   Alcohol use   Obesity, Class III, BMI 40-49.9 (morbid obesity) (HCC)   Acute CHF (congestive heart failure) (HCC)     1. Acute CHF: No prior echo in system.  Echo ordered and pending.  Could be related to poorly controlled hypertension and hypertensive heart disease.  BNP 310.  Minimally elevated troponin likely due to demand ischemia.  Cycle troponin.  Strict intake output, daily weights.  IV Lasix 60 mg twice daily.  Given degree of volume overload and how symptomatic he is, suspect that he will need at least a couple days of IV diuresis prior to transitioning to oral Lasix.  Also concerned that he may have low EF in which case he may warrant cardiology consultation. 2. Essential hypertension: Poorly controlled.  Suspect noncompliance.  Low-salt diet.  Appears that he was on carvedilol at one point.  Avoid beta-blockers until acute CHF has improved and then can resume carvedilol.  Started amlodipine 10 mg daily and PRN IV hydralazine.  Consider ACEI/ARB after volume status is improved, especially if he has low EF. 3. Acute respiratory failure with hypoxia: Secondary to acute CHF complicating possible underlying OSA.  Oxygen supplementation as needed. 4. Hyperlipidemia: Check fasting lipids.  Was on Crestor at one point. 5. Morbid obesity/Body mass index is 44.4 kg/m.  Advised regarding diet, exercise and weight loss. 6. Suspected OSA: Patient gives history of apneic spells since his teenage years.  Recommend outpatient evaluation including sleep study. 7. Tobacco abuse: Patient declines nicotine patch.  Cessation counseled. 8. Alcohol use: Last drink was approximately 6 days ago and hence low risk for withdrawal.  Moderation counseled.   DVT  prophylaxis: Lovenox Code Status: Full Family Communication: None at bedside Disposition Plan: DC home pending clinical improvement Consults called: None Admission status: Inpatient, telemetry.  Severity of Illness: The appropriate patient status for this patient is INPATIENT. Inpatient status is judged to be reasonable and necessary in order to provide the required intensity of service to ensure the patient's safety. The patient's presenting symptoms, physical exam findings,  and initial radiographic and laboratory data in the context of their chronic comorbidities is felt to place them at high risk for further clinical deterioration. Furthermore, it is not anticipated that the patient will be medically stable for discharge from the hospital within 2 midnights of admission. The following factors support the patient status of inpatient.   " The patient's presenting symptoms include progressively worsening dyspnea on exertion, orthopnea, PND, leg edema and abdominal distention " The worrisome physical exam findings include decompensated CHF in the context of uncontrolled hypertension. " The initial radiographic and laboratory data are worrisome because of chest x-ray suggestive of pulmonary edema, elevated BNP and troponin of 0.04. " The chronic co-morbidities include HLD, HTN, morbid obesity.   * I certify that at the point of admission it is my clinical judgment that the patient will require inpatient hospital care spanning beyond 2 midnights from the point of admission due to high intensity of service, high risk for further deterioration and high frequency of surveillance required.Marcellus Scott MD [facility]  To contact the attending provider between 7A-7P or the covering provider during after hours 7P-7A, please log into the web site [email or web address] and access using universal [place] password for that web site. If you do not have the password, please call the hospital  operator.  05/24/2018, 10:46 AM

## 2018-05-24 NOTE — Progress Notes (Signed)
Attending, Dr. Toniann Fail paged pts arrival. Someone returned call for Dr. Toniann Fail to relay that he is aware of pts arrival. Awaiting further orders.

## 2018-05-24 NOTE — Progress Notes (Signed)
  Echocardiogram 2D Echocardiogram has been performed.  Pieter Partridge 05/24/2018, 9:53 AM

## 2018-05-25 ENCOUNTER — Encounter (HOSPITAL_COMMUNITY): Payer: Self-pay | Admitting: Cardiology

## 2018-05-25 DIAGNOSIS — Z720 Tobacco use: Secondary | ICD-10-CM

## 2018-05-25 DIAGNOSIS — I420 Dilated cardiomyopathy: Secondary | ICD-10-CM

## 2018-05-25 DIAGNOSIS — I10 Essential (primary) hypertension: Secondary | ICD-10-CM

## 2018-05-25 DIAGNOSIS — I509 Heart failure, unspecified: Secondary | ICD-10-CM

## 2018-05-25 LAB — BASIC METABOLIC PANEL
Anion gap: 11 (ref 5–15)
BUN: 7 mg/dL (ref 6–20)
CO2: 28 mmol/L (ref 22–32)
Calcium: 9.2 mg/dL (ref 8.9–10.3)
Chloride: 102 mmol/L (ref 98–111)
Creatinine, Ser: 0.99 mg/dL (ref 0.61–1.24)
GFR calc Af Amer: >60 mL/min (ref >60)
GFR calc non Af Amer: >60 mL/min (ref >60)
Glucose, Bld: 96 mg/dL (ref 70–99)
Potassium: 3.2 mmol/L — ABNORMAL LOW (ref 3.5–5.1)
Sodium: 141 mmol/L (ref 135–145)

## 2018-05-25 LAB — LIPID PANEL
Cholesterol: 152 mg/dL (ref 0–200)
HDL: 36 mg/dL — ABNORMAL LOW (ref >40)
LDL Cholesterol: 102 mg/dL — ABNORMAL HIGH (ref 0–99)
Total CHOL/HDL Ratio: 4.2 RATIO
Triglycerides: 69 mg/dL (ref <150)
VLDL: 14 mg/dL (ref 0–40)

## 2018-05-25 LAB — HIV ANTIBODY (ROUTINE TESTING W REFLEX): HIV Screen 4th Generation wRfx: NONREACTIVE

## 2018-05-25 MED ORDER — ASPIRIN 81 MG PO CHEW
81.0000 mg | CHEWABLE_TABLET | ORAL | Status: AC
  Administered 2018-05-26: 81 mg via ORAL
  Filled 2018-05-25: qty 1

## 2018-05-25 MED ORDER — CARVEDILOL 6.25 MG PO TABS
6.2500 mg | ORAL_TABLET | Freq: Two times a day (BID) | ORAL | Status: DC
  Administered 2018-05-25 – 2018-05-27 (×4): 6.25 mg via ORAL
  Filled 2018-05-25 (×4): qty 1

## 2018-05-25 MED ORDER — PANTOPRAZOLE SODIUM 40 MG PO TBEC
40.0000 mg | DELAYED_RELEASE_TABLET | Freq: Every day | ORAL | Status: DC
  Administered 2018-05-25 – 2018-05-27 (×3): 40 mg via ORAL
  Filled 2018-05-25 (×3): qty 1

## 2018-05-25 MED ORDER — SODIUM CHLORIDE 0.9% FLUSH
3.0000 mL | Freq: Two times a day (BID) | INTRAVENOUS | Status: DC
  Administered 2018-05-25 – 2018-05-27 (×3): 3 mL via INTRAVENOUS

## 2018-05-25 MED ORDER — SODIUM CHLORIDE 0.9 % WEIGHT BASED INFUSION: 1.0000 mL/kg/h | INTRAVENOUS | Status: DC

## 2018-05-25 MED ORDER — POTASSIUM CHLORIDE CRYS ER 20 MEQ PO TBCR
40.0000 meq | EXTENDED_RELEASE_TABLET | Freq: Two times a day (BID) | ORAL | Status: DC
  Administered 2018-05-25 – 2018-05-26 (×3): 40 meq via ORAL
  Filled 2018-05-25 (×3): qty 2

## 2018-05-25 MED ORDER — SODIUM CHLORIDE 0.9 % WEIGHT BASED INFUSION
3.0000 mL/kg/h | INTRAVENOUS | Status: DC
  Administered 2018-05-26: 04:00:00 3 mL/kg/h via INTRAVENOUS

## 2018-05-25 MED ORDER — ROSUVASTATIN CALCIUM 5 MG PO TABS
10.0000 mg | ORAL_TABLET | Freq: Every day | ORAL | Status: DC
  Administered 2018-05-25 – 2018-05-27 (×3): 10 mg via ORAL
  Filled 2018-05-25 (×3): qty 2

## 2018-05-25 MED ORDER — SODIUM CHLORIDE 0.9% FLUSH: 3.0000 mL | INTRAVENOUS | Status: DC | PRN

## 2018-05-25 MED ORDER — SODIUM CHLORIDE 0.9 % IV SOLN: 250.0000 mL | INTRAVENOUS | Status: DC | PRN

## 2018-05-25 NOTE — Consult Note (Signed)
Cardiology Consultation:   [name] ID: Drew [name] MRN: [id number]; DOB: 01/26/1960  Admit date: 05/23/2018 Date of Consult: 05/25/2018  Primary Care Provider: Esperanza Richters, PA-C Primary Cardiologist: No primary care provider on file.  Primary Electrophysiologist:  None    [name] Profile:   Drew [name] is a 58 y.o. male with a hx of hypertension, hyperlipidemia, tobacco abuse, alcohol use, obesity who is being seen today for Drew evaluation of newly diagnosed cardiomyopathy at Drew request of Dr. Waymon Amato.  History of Present Illness:   Drew [name] works for [employer] and lives his spouse and family. Drew [name] presented to [facility] on 05/23/2018 with complaints of 3 week history of progressively worsening dyspnea and leg swelling. Drew [name] was transferred to [facility] for further evaluation and management.   Drew [name] tells me that Drew [name] used to play a lot of sports until about age 32 when his knee began to give him trouble. Drew [name] has continued to ride motorcycles and until about 3 weeks ago when his symptoms began, Drew [name] was doing well. Drew [name] denies any prior cardiac issues or having seen a cardiologist in Drew past. Drew [name] was diagnosed with hypertension in Drew past and was prescribed a medicine, however, Drew [name] only completed Drew provided 30 days and did not ever follow up for it. Drew [name] has hyperlipidemia for which Drew [name] takes rosuvastatin 20 mg daily. Drew [name] smokes about 1 PPD. Drew [name] does not know much about his parents' health history but does not think they have any heart issues. Drew [name] is not sure about hypertension. His paternal grandfather and a cousin had/have heart failure. Drew [name] is an only child.   Drew [name] says that about 3 weeks ago Drew [name] noted shortness of breath with walking 10-20 feet requiring him to sit down and rest for 5-10 minutes to recover. Drew [name] also noted lower leg swelling. His left leg is always swollen due to knee problem, but when Drew right swelled Drew [name] became concerned. Drew [name] has also had orthopnea and PND on most  nights causing him to sleep sitting up and having poor sleep. Drew [name] denies ever having had chest pain/pressure/tightness, palpitations, lightheadedness or syncope.   CXR showed pulm edema.  BNP 310.3 Troponins 0.04, 0.03 Echo showed EF 20-25%, severely dilated LV, mod concentric LVH, diffuse hypokinesis.   Drew [name] says that Drew [name] has ignored his health but his wife will now be taking control and Drew [name] says that Drew [name] will do what it takes to improve his health. Drew [name] declares that Drew [name] is not going to smoke any more. Drew [name] says that his wife does not allow him to use any salt on his food.   Past Medical History:  Diagnosis Date  . Allergy   . Hyperlipidemia 05/24/2018    Past Surgical History:  Procedure Laterality Date  . KNEE ARTHROSCOPY  2009  . THROAT SURGERY  2005     Home Medications:  Prior to Admission medications   Medication Sig Start Date End Date Taking? Authorizing Provider  albuterol (PROVENTIL HFA;VENTOLIN HFA) 108 (90 Base) MCG/ACT inhaler Inhale 2 puffs into Drew lungs every 6 (six) hours as needed for wheezing or shortness of breath. 11/29/17  Yes Saguier, Ramon Dredge, PA-C  pantoprazole (PROTONIX) 40 MG tablet Take 1 tablet (40 mg total) by mouth daily. 05/16/18  Yes Sharlene Dory, DO  rosuvastatin (CRESTOR) 10 MG tablet Take 1 tablet (10 mg total) by mouth daily. 12/17/17  Yes Saguier, Ramon Dredge, PA-C  budesonide-formoterol (SYMBICORT) 80-4.5 MCG/ACT inhaler Inhale 2 puffs into Drew lungs 2 (two)  times daily. [name] not taking: Reported on 05/24/2018 05/07/18   Saguier, Ramon Dredge, PA-C  buPROPion (WELLBUTRIN XL) 150 MG 24 hr tablet Take 1 tablet (150 mg total) by mouth daily. 12/17/17   Saguier, Ramon Dredge, PA-C  carvedilol (COREG) 3.125 MG tablet Take 1 tablet (3.125 mg total) by mouth 2 (two) times daily with a meal. [name] not taking: Reported on 05/24/2018 12/17/17   Esperanza Richters, PA-C    Inpatient Medications: Scheduled Meds: . amLODipine  10 mg Oral Daily  . enoxaparin (LOVENOX)  injection  40 mg Subcutaneous Q24H  . furosemide  60 mg Intravenous BID  . potassium chloride  40 mEq Oral BID  . sodium chloride flush  3 mL Intravenous Q12H   Continuous Infusions:  PRN Meds: acetaminophen **OR** acetaminophen, albuterol, hydrALAZINE, senna-docusate  Allergies:    Allergies  Allergen Reactions  . Tetracyclines & Related Hives and Itching    Social History:   Social History   Socioeconomic History  . Marital status: Married    Spouse name: Not on file  . Number of children: Not on file  . Years of education: Not on file  . Highest education level: Not on file  Occupational History  . Not on file  Social Needs  . Financial resource strain: Not hard at all  . Food insecurity:    Worry: Never true    Inability: Never true  . Transportation needs:    Medical: No    Non-medical: No  Tobacco Use  . Smoking status: Current Every Day Smoker    Packs/day: 1.00    Years: 20.00    Pack years: 20.00    Types: Cigarettes  . Smokeless tobacco: Never Used  Substance and Sexual Activity  . Alcohol use: Yes    Alcohol/week: 0.0 standard drinks    Comment: 6 pack a week spread out over weekend  . Drug use: No  . Sexual activity: Yes  Lifestyle  . Physical activity:    Days per week: [name] refused    Minutes per session: [name] refused  . Stress: Not on file  Relationships  . Social connections:    Talks on phone: [name] refused    Gets together: [name] refused    Attends religious service: [name] refused    Active member of club or organization: [name] refused    Attends meetings of clubs or organizations: [name] refused    Relationship status: [name] refused  . Intimate partner violence:    Fear of current or ex partner: [name] refused    Emotionally abused: [name] refused    Physically abused: [name] refused    Forced sexual activity: [name] refused  Other Topics Concern  . Not on file  Social History Narrative  . Not on file     Family History:    Family History  Problem Relation Age of Onset  . Hearing loss Mother   . Cancer Father   . Heart failure Other   . Heart failure Paternal Grandfather      ROS:  Please see Drew history of present illness.   All other ROS reviewed and negative.     Physical Exam/Data:   Vitals:   05/24/18 2357 05/25/18 0115 05/25/18 0300 05/25/18 0749  BP: (!) 157/93  130/89 133/85  Pulse: 85   87  Resp: (!) Temp: 98 F (36.7 C)  98 F (36.7 C) 98.2 F (36.8 C)  TempSrc: Oral  Oral Oral  SpO2: 92% 93% 92% 97%  Weight:   136 kg   Height:        Intake/Output Summary (Last 24 hours) at 05/25/2018 0824 Last data filed at 05/25/2018 0751 Gross per 24 hour  Intake 730 ml  Output 7025 ml  Net -6295 ml   Last 3 Weights 05/25/2018 05/23/2018 05/23/2018  Weight (lbs) 299 lb 13.2 oz 318 lb 5.5 oz 327 lb  Weight (kg) 136 kg 144.4 kg 148.326 kg     Body mass index is 41.82 kg/m.   Physical exam per MD  EKG:  Drew EKG was personally reviewed and demonstrates:  Sinus rhythm, 97 bpm, flattened T wave V5, Mild TWI V6 Telemetry:  Telemetry was personally reviewed and demonstrates:  Sinus rhythm in Drew 80's  Relevant CV Studies:  Echocardiogram 05/24/2018 IMPRESSIONS   1. Drew left ventricle has severely reduced systolic function, with an ejection fraction of 20-25%. Drew cavity size was severely dilated. There is moderate concentric left ventricular hypertrophy. Left ventricular diastolic Doppler parameters are  consistent with pseudonormalization. Elevated left atrial and left ventricular end-diastolic pressures Left ventricular diffuse hypokinesis.  2. Drew right ventricle has moderately reduced systolic function. Drew cavity was severely enlarged. There is no increase in right ventricular wall thickness. Right ventricular systolic pressure is mildly elevated with an estimated pressure of 28.5 mmHg.  3. Left atrial size was moderately dilated.  4. Right atrial size was  moderately dilated.  5. Mild thickening of Drew mitral valve leaflet.  6. Drew aortic valve is tricuspid. Moderate thickening of Drew aortic valve. Aortic valve regurgitation was not assessed by color flow Doppler.  7. Drew inferior vena cava was dilated in size with <50% respiratory variability.  SUMMARY Severe biventricular dilatation with severe LV systolic dysfunction, estimated LVEF 20-25% with diffuse hypokinesis, grade 2 diastolic dysfunction and elevated filling pressures. RVEF is moderately decreased. No prior echocardiogram available for comparison.  Laboratory Data:  Chemistry Recent Labs  Lab 05/23/18 1605 05/23/18 1732 05/25/18 0343  NA 142 141 141  K 4.0 3.9 3.2*  CL  --  108 102  CO2  --  27 28  GLUCOSE  --  107* 96  BUN  --  11 7  CREATININE  --  0.86 0.99  CALCIUM  --  8.8* 9.2  GFRNONAA  --  >60 >60  GFRAA  --  >60 >60  ANIONGAP  --  6 11    Recent Labs  Lab 05/23/18 1732  PROT 7.0  ALBUMIN 3.8  AST 16  ALT 17  ALKPHOS 58  BILITOT 1.7*   Hematology Recent Labs  Lab 05/23/18 1536 05/23/18 1605  WBC 7.0  --   RBC 4.67  --   HGB 14.5 15.6  HCT 45.0 46.0  MCV 96.4  --   MCH 31.0  --   MCHC 32.2  --   RDW 15.8*  --   PLT 208  --    Cardiac Enzymes Recent Labs  Lab 05/23/18 1536 05/24/18 1000 05/24/18 1608 05/24/18 2214  TROPONINI 0.04* 0.03* 0.03* 0.03*   No results for input(s): TROPIPOC in Drew last 168 hours.  BNP Recent Labs  Lab 05/23/18 1536  BNP 310.3*    DDimer No results for input(s): DDIMER in Drew last 168 hours.  Radiology/Studies:  Dg Chest 2 View  Result Date: 05/23/2018 CLINICAL DATA:  58 year old male with a history of shortness of breath EXAM: CHEST - 2 VIEW COMPARISON:  12/17/2017 FINDINGS: Cardiomediastinal silhouette unchanged in size and contour. Hazy mixed interstitial and  airspace opacities of Drew bilateral lungs. Blunting of Drew costophrenic sulcus on Drew lateral view. No pneumothorax. IMPRESSION: Hazy mixed  interstitial airspace opacities Drew bilateral lungs, with Drew differential including both pulmonary edema and multifocal infection. Electronically Signed   By: Gilmer Mor D.O.   On: 05/23/2018 16:22    Assessment and Plan:   1. Dilated Cardiomyopathy with CHF -No prior know cardiac disease. Hx of untreated hypertension, HLD, obesity and smoking. -Pt presented with 3 weeks of progressively worsening dyspnea, LE edema and orthopnea/PND.  -Echocardiogram shows EF 20-25%, severely dilated LV, mod concentric LVH, diffuse hypokinesis, mod reduced RV systolic function with mildly elevated RV pressure.  -Dilated CM is likely Drew result untreated HTN, although pt has risk factors for CAD and should be considered.  -Has been given lasix 60 mg IV BID. Pt had 7L UOP yesterday and net negative 10L since admit. Pt reports breathing back to normal and edema is "a whole lot better", only mild now.  -Renal function is normal. K+ is down today with diuresis, 3.2, being replaced.  -Will check A1c for risk stratification.  -Can likely initiate GDMT at this point. Would add carvedilol 3.125 mg BID and Entresto 24/26 mg bid. Can later consider adding aldactone as tolerated. May need to pull back or stop amlodipine. Sherryll Burger has good diuretic effect, will have to see if pt needs diuretic at discharge.  -Plan to recheck echo ~3 months after medical therapy optimized. If EF remains low would likely need cardiac cath to evaluate and consider ICD. Hopefully will improve with BP management and GDMT.   2. Hypertension -Previously treated for 30 days, but pt did not follow up. Since that time has been untreated.  -BP was initially elevated  -Has been started on amlodipine 10 mg daily -BP currently better controlled, 133/85.  -Goal BP <130/80. -Will make med changes for HF and titrate according to blood pressure.  -Low sodium diet. We discussed hidden sodium in prepared foods.  -Pt is motivated to make changes and  follow up better with care.   3. Hyperlipidemia -On rosuvastatin 10 mg daily.  -LDL is 102. Continue current medication.   4. Tobacco abuse -1PPD smoker. We discussed Drew effects of smoking on CV disease. Pt is motivated to quit and feels confident that Drew [name] can.    For questions or updates, please contact [facility] Please consult [email or web address] for contact info under    Signed, Berton Bon, NP  05/25/2018 8:24 AM   History and all data above reviewed.  [name] examined.  I agree with Drew findings as above.   Drew [name] had SOB starting in about Nov.  Treated for possible pulmonary etiology.  However, recurred and had abdominal distention and then right leg swelling (left swells since knee surgery).  Found to have reduced EF as above.  Drew [name] denies any new symptoms such as chest discomfort, neck or arm discomfort. There has been no new PND or orthopnea. There have been no reported palpitations, presyncope or syncope. Feels much better since diuresis.  Has had no prior cardiac work up except a perfusion study years ago.    GENERAL:  Well appearing HEENT:  Pupils equal round and reactive, fundi not visualized, oral mucosa unremarkable NECK:  No jugular venous distention, waveform within normal limits, carotid upstroke brisk and symmetric, no bruits, no thyromegaly LYMPHATICS:  No cervical, inguinal adenopathy LUNGS:  Clear to auscultation bilaterally BACK:  No CVA tenderness CHEST:  Unremarkable HEART:  PMI not displaced or  sustained,S1 and S2 within normal limits, no S3, no S4, no clicks, no rubs, no murmurs ABD:  Flat, positive bowel sounds normal in frequency in pitch, no bruits, no rebound, no guarding, no midline pulsatile mass, no hepatomegaly, no splenomegaly, obese EXT:  2 plus pulses throughout, no edema, no cyanosis no clubbing SKIN:  No rashes no nodules NEURO:  Cranial nerves II through XII grossly intact, motor grossly intact throughout PSYCH:  Cognitively intact,  oriented to person place and time   All available labs, radiology testing, previous records reviewed. Agree with documented assessment and plan.   New onset HF:  Etiology could be ischemia though I think less likely than other etiologies. This could be related to uncontrolled HTN although Drew [name] does not report this.  This also could be ETOH.  Hard to gage how much Drew [name] drinks but I suspect that it is quite a bit.  I suggested right and left heart cath. Drew [name] understands that risks included but are not limited to stroke (1 in 1000), death (1 in 1000), kidney failure [usually temporary] (1 in 500), bleeding (1 in 200), allergic reaction [possibly serious] (1 in 200).  Drew [name] understands and agrees to proceed.   Stop Norvasc and start Coreg.  Will add Entresto before discharge as well after Drew cath.  I talked to him about salt/fluid and ETOH.  Drew [name] does snore but has no other evidence for apnea although I will keep this in mind.    Fayrene Fearing Bunny Kleist  11:21 AM  05/25/2018

## 2018-05-25 NOTE — Progress Notes (Addendum)
PROGRESS NOTE   Drew [name]  VVY:[id number]    DOB: 10-16-1960    DOA: 05/23/2018  PCP: Marisue Brooklyn   I have briefly reviewed patients previous medical records in [facility].  Brief Narrative:  58 year old married male, lives with spouse and family, independent, Radiographer, therapeutic employee, PMH of HTN, HLD, tobacco abuse, alcohol use, obesity, initially presented to [facility] ED on 05/23/2018 due to 3 weeks history of progressively worsening dyspnea, orthopnea, PND and leg edema.  Admitted for new onset acute CHF.  TTE reveals dilated cardiomyopathy with low EF.  Cardiology consulted.  Improved after IV diuresis.  Plan for cardiac cath 5/11.  Assessment & Plan:   Principal Problem:   New onset of congestive heart failure (HCC) Active Problems:   Essential hypertension   Hyperlipidemia   Tobacco abuse   Alcohol use   Obesity, Class III, BMI 40-49.9 (morbid obesity) (HCC)   Acute CHF (congestive heart failure) (HCC)   1. Acute systolic and diastolic CHF/cardiomyopathy: Etiology/DD: Longstanding poorly controlled HTN, alcohol abuse, rule out CAD versus others.  TTE 5/9: LVEF 20-25% with diffuse hypokinesis and grade 2 diastolic dysfunction.  Treated with IV Lasix 60 mg every 12 hours.  -10 L since admission.  Down by 27 pounds since admission.  Volume status much improved.  Cardiology consultation appreciated.  Started carvedilol 6.25 mg twice daily with plans to start Entresto and Aldactone post-cath. 2. Hypokalemia: Secondary to aggressive IV diuresis.  Replace and follow. 3. Essential hypertension: Amlodipine discontinued by cardiology and started carvedilol 6.25 mg twice daily.  Mildly uncontrolled.  Monitor.  Considering Entresto and Aldactone post-cath. 4. Acute respiratory failure with hypoxia: Secondary to decompensated CHF complicating possible underlying OSA.  Will reassess home oxygen requirement prior to discharge. 5. Hyperlipidemia: LDL 101.  Resume Crestor which he had stopped  taking. 6. Morbid obesity/Body mass index is 41.82 kg/m.  Needs to diet, exercise and weight loss. 7. Suspected OSA: Outpatient evaluation with sleep study. 8. Tobacco abuse: Cessation counseled.  Declines nicotine patch. 9. Alcohol use/?  Abuse: No overt withdrawal.   DVT prophylaxis: Lovenox Code Status: Full Family Communication: Patient kindly declined MDs offer to speak with spouse to update care. Disposition: DC home pending further cardiac evaluation/cath and clinical improvement   Consultants:  Cardiology  Procedures:  None  Antimicrobials:  None   Subjective: Feels much better.  Dyspnea much improved and close to normal.  Ambulated in the room without difficulty breathing which she was not able to do prior to admission.  Urinated very well after IV Lasix.  Had some leg cramps this morning, resolved now.  ROS: As above, otherwise negative.  Objective:  Vitals:   05/25/18 0115 05/25/18 0300 05/25/18 0749 05/25/18 1044  BP:  130/89 133/85 (!) 123/93  Pulse:   87   Resp: 19 20 17    Temp:  98 F (36.7 C) 98.2 F (36.8 C)   TempSrc:  Oral Oral   SpO2: 93% 92% 97%   Weight:  136 kg    Height:        Examination:  General exam: Pleasant young male, moderately built and obese sitting up comfortably in chair this morning. Respiratory system: Occasional basal crackles but otherwise clear to auscultation. Respiratory effort normal. Cardiovascular system: S1 & S2 heard, RRR. No JVD, murmurs, rubs, gallops or clicks.  Trace ankle edema.  Telemetry personally reviewed: Sinus rhythm. Gastrointestinal system: Abdomen is nondistended, soft and nontender. No organomegaly or masses felt. Normal bowel sounds heard. Central  nervous system: Alert and oriented. No focal neurological deficits. Extremities: Symmetric 5 x 5 power. Skin: No rashes, lesions or ulcers Psychiatry: Judgement and insight appear normal. Mood & affect appropriate.     Data Reviewed: I have personally  reviewed following labs and imaging studies  CBC: Recent Labs  Lab 05/23/18 1536 05/23/18 1605  WBC 7.0  --   NEUTROABS 4.7  --   HGB 14.5 15.6  HCT 45.0 46.0  MCV 96.4  --   PLT 208  --    Basic Metabolic Panel: Recent Labs  Lab 05/23/18 1605 05/23/18 1732 05/25/18 0343  NA 142 141 141  K 4.0 3.9 3.2*  CL  --  108 102  CO2  --  27 28  GLUCOSE  --  107* 96  BUN  --  11 7  CREATININE  --  0.86 0.99  CALCIUM  --  8.8* 9.2   Liver Function Tests: Recent Labs  Lab 05/23/18 1732  AST 16  ALT 17  ALKPHOS 58  BILITOT 1.7*  PROT 7.0  ALBUMIN 3.8   Coagulation Profile: Recent Labs  Lab 05/23/18 1536  INR 1.0   Cardiac Enzymes: Recent Labs  Lab 05/23/18 1536 05/24/18 1000 05/24/18 1608 05/24/18 2214  TROPONINI 0.04* 0.03* 0.03* 0.03*     Recent Results (from the past 240 hour(s))  SARS Coronavirus 2 (Hosp order,Performed in [facility] lab via [employer])     Status: None   Collection Time: 05/23/18  4:04 PM  Result Value Ref Range Status   SARS Coronavirus 2 ([employer] Now) NEGATIVE NEGATIVE Final    Comment: (NOTE) Interpretive Result Comment(s): COVID 19 Positive SARS CoV 2 target nucleic acids are DETECTED. The SARS CoV 2 RNA is generally detectable in upper and lower respiratory specimens during the acute phase of infection.  Positive results are indicative of active infection with SARS CoV 2.  Clinical correlation with patient history and other diagnostic information is necessary to determine patient infection status.  Positive results do not rule out bacterial infection or coinfection with other viruses. The expected result is Negative. COVID 19 Negative SARS CoV 2 target nucleic acids are NOT DETECTED. The SARS CoV 2 RNA is generally detectable in upper and lower respiratory specimens during the acute phase of infection.  Negative results do not preclude SARS CoV 2 infection, do not rule out coinfections with other pathogens, and should not  be used as the sole basis for treatment or other patient management decisions.  Negative results must be combined with clinical  observations, patient history, and epidemiological information. The expected result is Negative. Invalid Presence or absence of SARS CoV 2 nucleic acids cannot be determined. Repeat testing was performed on the submitted specimen and repeated Invalid results were obtained.  If clinically indicated, additional testing on a new specimen with an alternate test methodology [phone]) is advised.  The SARS CoV 2 RNA is generally detectable in upper and lower respiratory specimens during the acute phase of infection. The expected result is Negative. Fact Sheet for Patients:  http://www.graves-ford.org/ Fact Sheet for Healthcare Providers: EnviroConcern.si This test is not yet approved or cleared by the [place] FDA and has been authorized for detection and/or diagnosis of SARS CoV 2 by FDA under an Emergency Use Authorization (EUA).  This EUA will remain in effect (meaning this test can be used) for the duration of the COVID19 d eclaration under Section 564(b)(1) of the Act, 21 U.S.C. section [phone] 3(b)(1), unless the authorization  is terminated or revoked sooner. Performed at [facility], [address] [place], [place] [address]   MRSA PCR Screening     Status: None   Collection Time: 05/23/18 10:09 PM  Result Value Ref Range Status   MRSA by PCR NEGATIVE NEGATIVE Final    Comment:        The GeneXpert MRSA Assay (FDA approved for NASAL specimens only), is one component of a comprehensive MRSA colonization surveillance program. It is not intended to diagnose MRSA infection nor to guide or monitor treatment for MRSA infections. Performed at [facility], [address], [place], [place] [address]          Radiology Studies: Dg Chest 2 View  Result Date: 05/23/2018 CLINICAL DATA:   58 year old male with a history of shortness of breath EXAM: CHEST - 2 VIEW COMPARISON:  12/17/2017 FINDINGS: Cardiomediastinal silhouette unchanged in size and contour. Hazy mixed interstitial and airspace opacities of the bilateral lungs. Blunting of the costophrenic sulcus on the lateral view. No pneumothorax. IMPRESSION: Hazy mixed interstitial airspace opacities the bilateral lungs, with the differential including both pulmonary edema and multifocal infection. Electronically Signed   By: Gilmer Mor D.O.   On: 05/23/2018 16:22        Scheduled Meds: . carvedilol  6.25 mg Oral BID WC  . enoxaparin (LOVENOX) injection  40 mg Subcutaneous Q24H  . furosemide  60 mg Intravenous BID  . potassium chloride  40 mEq Oral BID  . sodium chloride flush  3 mL Intravenous Q12H  . sodium chloride flush  3 mL Intravenous Q12H   Continuous Infusions:   LOS: 1 day     Marcellus Scott, MD, FACP, [facility]  To contact the attending provider between 7A-7P or the covering provider during after hours 7P-7A, please log into the web site [email or web address] and access using universal [place] password for that web site. If you do not have the password, please call the hospital operator.  05/25/2018, 12:43 PM

## 2018-05-25 NOTE — H&P (View-Only) (Signed)
Cardiology Consultation:   Patient ID: Drew [name] MRN: [id number]; DOB: 01/26/1960  Admit date: 05/23/2018 Date of Consult: 05/25/2018  Primary Care Provider: Esperanza Richters, PA-C Primary Cardiologist: No primary care provider on file.  Primary Electrophysiologist:  None    Patient Profile:   Drew [name] is a 58 y.o. male with a hx of hypertension, hyperlipidemia, tobacco abuse, alcohol use, obesity who is being seen today for the evaluation of newly diagnosed cardiomyopathy at the request of Dr. Waymon Amato.  History of Present Illness:   Drew [name] works for [employer] and lives his spouse and family. He presented to [facility] on 05/23/2018 with complaints of 3 week history of progressively worsening dyspnea and leg swelling. He was transferred to [facility] for further evaluation and management.   Drew [name] tells me that he used to play a lot of sports until about age 32 when his knee began to give him trouble. He has continued to ride motorcycles and until about 3 weeks ago when his symptoms began, he was doing well. He denies any prior cardiac issues or having seen a cardiologist in the past. He was diagnosed with hypertension in the past and was prescribed a medicine, however, he only completed the provided 30 days and did not ever follow up for it. He has hyperlipidemia for which he takes rosuvastatin 20 mg daily. He smokes about 1 PPD. He does not know much about his parents' health history but does not think they have any heart issues. He is not sure about hypertension. His paternal grandfather and a cousin had/have heart failure. He is an only child.   Drew [name] says that about 3 weeks ago he noted shortness of breath with walking 10-20 feet requiring him to sit down and rest for 5-10 minutes to recover. He also noted lower leg swelling. His left leg is always swollen due to knee problem, but when the right swelled he became concerned. He has also had orthopnea and PND on most  nights causing him to sleep sitting up and having poor sleep. He denies ever having had chest pain/pressure/tightness, palpitations, lightheadedness or syncope.   CXR showed pulm edema.  BNP 310.3 Troponins 0.04, 0.03 Echo showed EF 20-25%, severely dilated LV, mod concentric LVH, diffuse hypokinesis.   Drew [name] says that he has ignored his health but his wife will now be taking control and he says that he will do what it takes to improve his health. He declares that he is not going to smoke any more. He says that his wife does not allow him to use any salt on his food.   Past Medical History:  Diagnosis Date  . Allergy   . Hyperlipidemia 05/24/2018    Past Surgical History:  Procedure Laterality Date  . KNEE ARTHROSCOPY  2009  . THROAT SURGERY  2005     Home Medications:  Prior to Admission medications   Medication Sig Start Date End Date Taking? Authorizing Provider  albuterol (PROVENTIL HFA;VENTOLIN HFA) 108 (90 Base) MCG/ACT inhaler Inhale 2 puffs into the lungs every 6 (six) hours as needed for wheezing or shortness of breath. 11/29/17  Yes Saguier, Ramon Dredge, PA-C  pantoprazole (PROTONIX) 40 MG tablet Take 1 tablet (40 mg total) by mouth daily. 05/16/18  Yes Sharlene Dory, DO  rosuvastatin (CRESTOR) 10 MG tablet Take 1 tablet (10 mg total) by mouth daily. 12/17/17  Yes Saguier, Ramon Dredge, PA-C  budesonide-formoterol (SYMBICORT) 80-4.5 MCG/ACT inhaler Inhale 2 puffs into the lungs 2 (two)  times daily. Patient not taking: Reported on 05/24/2018 05/07/18   Saguier, Edward, PA-C  buPROPion (WELLBUTRIN XL) 150 MG 24 hr tablet Take 1 tablet (150 mg total) by mouth daily. 12/17/17   Saguier, Edward, PA-C  carvedilol (COREG) 3.125 MG tablet Take 1 tablet (3.125 mg total) by mouth 2 (two) times daily with a meal. Patient not taking: Reported on 05/24/2018 12/17/17   Saguier, Edward, PA-C    Inpatient Medications: Scheduled Meds: . amLODipine  10 mg Oral Daily  . enoxaparin (LOVENOX)  injection  40 mg Subcutaneous Q24H  . furosemide  60 mg Intravenous BID  . potassium chloride  40 mEq Oral BID  . sodium chloride flush  3 mL Intravenous Q12H   Continuous Infusions:  PRN Meds: acetaminophen **OR** acetaminophen, albuterol, hydrALAZINE, senna-docusate  Allergies:    Allergies  Allergen Reactions  . Tetracyclines & Related Hives and Itching    Social History:   Social History   Socioeconomic History  . Marital status: Married    Spouse name: Not on file  . Number of children: Not on file  . Years of education: Not on file  . Highest education level: Not on file  Occupational History  . Not on file  Social Needs  . Financial resource strain: Not hard at all  . Food insecurity:    Worry: Never true    Inability: Never true  . Transportation needs:    Medical: No    Non-medical: No  Tobacco Use  . Smoking status: Current Every Day Smoker    Packs/day: 1.00    Years: 20.00    Pack years: 20.00    Types: Cigarettes  . Smokeless tobacco: Never Used  Substance and Sexual Activity  . Alcohol use: Yes    Alcohol/week: 0.0 standard drinks    Comment: 6 pack a week spread out over weekend  . Drug use: No  . Sexual activity: Yes  Lifestyle  . Physical activity:    Days per week: Patient refused    Minutes per session: Patient refused  . Stress: Not on file  Relationships  . Social connections:    Talks on phone: Patient refused    Gets together: Patient refused    Attends religious service: Patient refused    Active member of club or organization: Patient refused    Attends meetings of clubs or organizations: Patient refused    Relationship status: Patient refused  . Intimate partner violence:    Fear of current or ex partner: Patient refused    Emotionally abused: Patient refused    Physically abused: Patient refused    Forced sexual activity: Patient refused  Other Topics Concern  . Not on file  Social History Narrative  . Not on file     Family History:    Family History  Problem Relation Age of Onset  . Hearing loss Mother   . Cancer Father   . Heart failure Other   . Heart failure Paternal Grandfather      ROS:  Please see the history of present illness.   All other ROS reviewed and negative.     Physical Exam/Data:   Vitals:   05/24/18 2357 05/25/18 0115 05/25/18 0300 05/25/18 0749  BP: (!) 157/93  130/89 133/85  Pulse: 85   87  Resp: (!) 25 19 20 17  Temp: 98 F (36.7 C)  98 F (36.7 C) 98.2 F (36.8 C)  TempSrc: Oral  Oral Oral  SpO2: 92% 93% 92% 97%    Weight:   136 kg   Height:        Intake/Output Summary (Last 24 hours) at 05/25/2018 0824 Last data filed at 05/25/2018 0751 Gross per 24 hour  Intake 730 ml  Output 7025 ml  Net -6295 ml   Last 3 Weights 05/25/2018 05/23/2018 05/23/2018  Weight (lbs) 299 lb 13.2 oz 318 lb 5.5 oz 327 lb  Weight (kg) 136 kg 144.4 kg 148.326 kg     Body mass index is 41.82 kg/m.   Physical exam per MD  EKG:  The EKG was personally reviewed and demonstrates:  Sinus rhythm, 97 bpm, flattened T wave V5, Mild TWI V6 Telemetry:  Telemetry was personally reviewed and demonstrates:  Sinus rhythm in the 80's  Relevant CV Studies:  Echocardiogram 05/24/2018 IMPRESSIONS   1. The left ventricle has severely reduced systolic function, with an ejection fraction of 20-25%. The cavity size was severely dilated. There is moderate concentric left ventricular hypertrophy. Left ventricular diastolic Doppler parameters are  consistent with pseudonormalization. Elevated left atrial and left ventricular end-diastolic pressures Left ventricular diffuse hypokinesis.  2. The right ventricle has moderately reduced systolic function. The cavity was severely enlarged. There is no increase in right ventricular wall thickness. Right ventricular systolic pressure is mildly elevated with an estimated pressure of 28.5 mmHg.  3. Left atrial size was moderately dilated.  4. Right atrial size was  moderately dilated.  5. Mild thickening of the mitral valve leaflet.  6. The aortic valve is tricuspid. Moderate thickening of the aortic valve. Aortic valve regurgitation was not assessed by color flow Doppler.  7. The inferior vena cava was dilated in size with <50% respiratory variability.  SUMMARY Severe biventricular dilatation with severe LV systolic dysfunction, estimated LVEF 20-25% with diffuse hypokinesis, grade 2 diastolic dysfunction and elevated filling pressures. RVEF is moderately decreased. No prior echocardiogram available for comparison.  Laboratory Data:  Chemistry Recent Labs  Lab 05/23/18 1605 05/23/18 1732 05/25/18 0343  NA 142 141 141  K 4.0 3.9 3.2*  CL  --  108 102  CO2  --  27 28  GLUCOSE  --  107* 96  BUN  --  11 7  CREATININE  --  0.86 0.99  CALCIUM  --  8.8* 9.2  GFRNONAA  --  >60 >60  GFRAA  --  >60 >60  ANIONGAP  --  6 11    Recent Labs  Lab 05/23/18 1732  PROT 7.0  ALBUMIN 3.8  AST 16  ALT 17  ALKPHOS 58  BILITOT 1.7*   Hematology Recent Labs  Lab 05/23/18 1536 05/23/18 1605  WBC 7.0  --   RBC 4.67  --   HGB 14.5 15.6  HCT 45.0 46.0  MCV 96.4  --   MCH 31.0  --   MCHC 32.2  --   RDW 15.8*  --   PLT 208  --    Cardiac Enzymes Recent Labs  Lab 05/23/18 1536 05/24/18 1000 05/24/18 1608 05/24/18 2214  TROPONINI 0.04* 0.03* 0.03* 0.03*   No results for input(s): TROPIPOC in the last 168 hours.  BNP Recent Labs  Lab 05/23/18 1536  BNP 310.3*    DDimer No results for input(s): DDIMER in the last 168 hours.  Radiology/Studies:  Dg Chest 2 View  Result Date: 05/23/2018 CLINICAL DATA:  58 year old male with a history of shortness of breath EXAM: CHEST - 2 VIEW COMPARISON:  12/17/2017 FINDINGS: Cardiomediastinal silhouette unchanged in size and contour. Hazy mixed interstitial and  airspace opacities of the bilateral lungs. Blunting of the costophrenic sulcus on the lateral view. No pneumothorax. IMPRESSION: Hazy mixed  interstitial airspace opacities the bilateral lungs, with the differential including both pulmonary edema and multifocal infection. Electronically Signed   By: Gilmer Mor D.O.   On: 05/23/2018 16:22    Assessment and Plan:   1. Dilated Cardiomyopathy with CHF -No prior know cardiac disease. Hx of untreated hypertension, HLD, obesity and smoking. -Pt presented with 3 weeks of progressively worsening dyspnea, LE edema and orthopnea/PND.  -Echocardiogram shows EF 20-25%, severely dilated LV, mod concentric LVH, diffuse hypokinesis, mod reduced RV systolic function with mildly elevated RV pressure.  -Dilated CM is likely the result untreated HTN, although pt has risk factors for CAD and should be considered.  -Has been given lasix 60 mg IV BID. Pt had 7L UOP yesterday and net negative 10L since admit. Pt reports breathing back to normal and edema is "a whole lot better", only mild now.  -Renal function is normal. K+ is down today with diuresis, 3.2, being replaced.  -Will check A1c for risk stratification.  -Can likely initiate GDMT at this point. Would add carvedilol 3.125 mg BID and Entresto 24/26 mg bid. Can later consider adding aldactone as tolerated. May need to pull back or stop amlodipine. Drew [name] has good diuretic effect, will have to see if pt needs diuretic at discharge.  -Plan to recheck echo ~3 months after medical therapy optimized. If EF remains low would likely need cardiac cath to evaluate and consider ICD. Hopefully will improve with BP management and GDMT.   2. Hypertension -Previously treated for 30 days, but pt did not follow up. Since that time has been untreated.  -BP was initially elevated  -Has been started on amlodipine 10 mg daily -BP currently better controlled, 133/85.  -Goal BP <130/80. -Will make med changes for HF and titrate according to blood pressure.  -Low sodium diet. We discussed hidden sodium in prepared foods.  -Pt is motivated to make changes and  follow up better with care.   3. Hyperlipidemia -On rosuvastatin 10 mg daily.  -LDL is 102. Continue current medication.   4. Tobacco abuse -1PPD smoker. We discussed the effects of smoking on CV disease. Pt is motivated to quit and feels confident that he can.    For questions or updates, please contact [facility] Please consult [email or web address] for contact info under    Signed, Berton Bon, NP  05/25/2018 8:24 AM   History and all data above reviewed.  Patient examined.  I agree with the findings as above.   He had SOB starting in about Nov.  Treated for possible pulmonary etiology.  However, recurred and had abdominal distention and then right leg swelling (left swells since knee surgery).  Found to have reduced EF as above.  The patient denies any new symptoms such as chest discomfort, neck or arm discomfort. There has been no new PND or orthopnea. There have been no reported palpitations, presyncope or syncope. Feels much better since diuresis.  Has had no prior cardiac work up except a perfusion study years ago.    GENERAL:  Well appearing HEENT:  Pupils equal round and reactive, fundi not visualized, oral mucosa unremarkable NECK:  No jugular venous distention, waveform within normal limits, carotid upstroke brisk and symmetric, no bruits, no thyromegaly LYMPHATICS:  No cervical, inguinal adenopathy LUNGS:  Clear to auscultation bilaterally BACK:  No CVA tenderness CHEST:  Unremarkable HEART:  PMI not displaced or  sustained,S1 and S2 within normal limits, no S3, no S4, no clicks, no rubs, no murmurs ABD:  Flat, positive bowel sounds normal in frequency in pitch, no bruits, no rebound, no guarding, no midline pulsatile mass, no hepatomegaly, no splenomegaly, obese EXT:  2 plus pulses throughout, no edema, no cyanosis no clubbing SKIN:  No rashes no nodules NEURO:  Cranial nerves II through XII grossly intact, motor grossly intact throughout PSYCH:  Cognitively intact,  oriented to person place and time   All available labs, radiology testing, previous records reviewed. Agree with documented assessment and plan.   New onset HF:  Etiology could be ischemia though I think less likely than other etiologies. This could be related to uncontrolled HTN although he does not report this.  This also could be ETOH.  Hard to gage how much he drinks but I suspect that it is quite a bit.  I suggested right and left heart cath. The patient understands that risks included but are not limited to stroke (1 in 1000), death (1 in 1000), kidney failure [usually temporary] (1 in 500), bleeding (1 in 200), allergic reaction [possibly serious] (1 in 200).  The patient understands and agrees to proceed.   Stop Norvasc and start Coreg.  Will add Entresto before discharge as well after the cath.  I talked to him about salt/fluid and ETOH.  He does snore but has no other evidence for apnea although I will keep this in mind.    Fayrene Fearing Catori Panozzo  11:21 AM  05/25/2018

## 2018-05-26 ENCOUNTER — Encounter (HOSPITAL_COMMUNITY)
Admission: EM | Disposition: A | Discharge status: Home / Self Care | Payer: Self-pay | Source: Home / Self Care | Attending: Internal Medicine

## 2018-05-26 DIAGNOSIS — I5043 Acute on chronic combined systolic (congestive) and diastolic (congestive) heart failure: Secondary | ICD-10-CM

## 2018-05-26 DIAGNOSIS — E782 Mixed hyperlipidemia: Secondary | ICD-10-CM

## 2018-05-26 DIAGNOSIS — I5021 Acute systolic (congestive) heart failure: Secondary | ICD-10-CM

## 2018-05-26 DIAGNOSIS — R7989 Other specified abnormal findings of blood chemistry: Secondary | ICD-10-CM

## 2018-05-26 HISTORY — PX: RIGHT/LEFT HEART CATH AND CORONARY ANGIOGRAPHY: CATH118266

## 2018-05-26 LAB — POCT I-STAT 7, (LYTES, BLD GAS, ICA,H+H)
Acid-Base Excess: 1 mmol/L (ref 0.0–2.0)
Bicarbonate: 27.8 mmol/L (ref 20.0–28.0)
Calcium, Ion: 1.18 mmol/L (ref 1.15–1.40)
HCT: 48 % (ref 39.0–52.0)
Hemoglobin: 16.3 g/dL (ref 13.0–17.0)
O2 Saturation: 95 %
Potassium: 3.7 mmol/L (ref 3.5–5.1)
Sodium: 141 mmol/L (ref 135–145)
TCO2: 29 mmol/L (ref 22–32)
pCO2 arterial: 51.1 mmHg — ABNORMAL HIGH (ref 32.0–48.0)
pH, Arterial: 7.344 — ABNORMAL LOW (ref 7.350–7.450)
pO2, Arterial: 81 mmHg — ABNORMAL LOW (ref 83.0–108.0)

## 2018-05-26 LAB — BASIC METABOLIC PANEL
Anion gap: 11 (ref 5–15)
BUN: 12 mg/dL (ref 6–20)
CO2: 29 mmol/L (ref 22–32)
Calcium: 8.8 mg/dL — ABNORMAL LOW (ref 8.9–10.3)
Chloride: 99 mmol/L (ref 98–111)
Creatinine, Ser: 1.21 mg/dL (ref 0.61–1.24)
GFR calc Af Amer: >60 mL/min (ref >60)
GFR calc non Af Amer: >60 mL/min (ref >60)
Glucose, Bld: 104 mg/dL — ABNORMAL HIGH (ref 70–99)
Potassium: 3.9 mmol/L (ref 3.5–5.1)
Sodium: 139 mmol/L (ref 135–145)

## 2018-05-26 LAB — POCT I-STAT EG7
Acid-Base Excess: 2 mmol/L (ref 0.0–2.0)
Acid-Base Excess: 2 mmol/L (ref 0.0–2.0)
Bicarbonate: 28.9 mmol/L — ABNORMAL HIGH (ref 20.0–28.0)
Bicarbonate: 29.5 mmol/L — ABNORMAL HIGH (ref 20.0–28.0)
Calcium, Ion: 1.15 mmol/L (ref 1.15–1.40)
Calcium, Ion: 1.2 mmol/L (ref 1.15–1.40)
HCT: 47 % (ref 39.0–52.0)
HCT: 47 % (ref 39.0–52.0)
Hemoglobin: 16 g/dL (ref 13.0–17.0)
Hemoglobin: 16 g/dL (ref 13.0–17.0)
O2 Saturation: 73 %
O2 Saturation: 74 %
Potassium: 3.7 mmol/L (ref 3.5–5.1)
Potassium: 3.7 mmol/L (ref 3.5–5.1)
Sodium: 141 mmol/L (ref 135–145)
Sodium: 142 mmol/L (ref 135–145)
TCO2: 31 mmol/L (ref 22–32)
TCO2: 31 mmol/L (ref 22–32)
pCO2, Ven: 54.8 mmHg (ref 44.0–60.0)
pCO2, Ven: 56.6 mmHg (ref 44.0–60.0)
pH, Ven: 7.325 (ref 7.250–7.430)
pH, Ven: 7.33 (ref 7.250–7.430)
pO2, Ven: 43 mmHg (ref 32.0–45.0)
pO2, Ven: 43 mmHg (ref 32.0–45.0)

## 2018-05-26 LAB — CBC
HCT: 47.2 % (ref 39.0–52.0)
Hemoglobin: 15.5 g/dL (ref 13.0–17.0)
MCH: 31.1 pg (ref 26.0–34.0)
MCHC: 32.8 g/dL (ref 30.0–36.0)
MCV: 94.8 fL (ref 80.0–100.0)
Platelets: 230 10*3/uL (ref 150–400)
RBC: 4.98 MIL/uL (ref 4.22–5.81)
RDW: 14.6 % (ref 11.5–15.5)
WBC: 5.6 10*3/uL (ref 4.0–10.5)
nRBC: 0 % (ref 0.0–0.2)

## 2018-05-26 LAB — MAGNESIUM: Magnesium: 2.1 mg/dL (ref 1.7–2.4)

## 2018-05-26 LAB — CREATININE, SERUM
Creatinine, Ser: 1.27 mg/dL — ABNORMAL HIGH (ref 0.61–1.24)
GFR calc Af Amer: >60 mL/min (ref >60)
GFR calc non Af Amer: >60 mL/min (ref >60)

## 2018-05-26 LAB — HEMOGLOBIN A1C
Hgb A1c MFr Bld: 5.3 % (ref 4.8–5.6)
Mean Plasma Glucose: 105.41 mg/dL

## 2018-05-26 SURGERY — RIGHT/LEFT HEART CATH AND CORONARY ANGIOGRAPHY
Anesthesia: LOCAL

## 2018-05-26 MED ORDER — VERAPAMIL HCL 2.5 MG/ML IV SOLN
INTRAVENOUS | Status: DC | PRN
  Administered 2018-05-26: 09:00:00 via INTRA_ARTERIAL

## 2018-05-26 MED ORDER — HEPARIN (PORCINE) IN NACL 1000-0.9 UT/500ML-% IV SOLN
INTRAVENOUS | Status: DC | PRN
  Administered 2018-05-26 (×3): 500 mL

## 2018-05-26 MED ORDER — SODIUM CHLORIDE 0.9% FLUSH: 3.0000 mL | Freq: Two times a day (BID) | INTRAVENOUS | Status: DC

## 2018-05-26 MED ORDER — HYDRALAZINE HCL 20 MG/ML IJ SOLN: 10.0000 mg | INTRAMUSCULAR | Status: AC | PRN

## 2018-05-26 MED ORDER — SODIUM CHLORIDE 0.9% FLUSH: 3.0000 mL | INTRAVENOUS | Status: DC | PRN

## 2018-05-26 MED ORDER — LIDOCAINE HCL (PF) 1 % IJ SOLN
INTRAMUSCULAR | Status: DC | PRN
  Administered 2018-05-26 (×2): 2 mL via SUBCUTANEOUS

## 2018-05-26 MED ORDER — ACETAMINOPHEN 325 MG PO TABS: 650.0000 mg | ORAL_TABLET | ORAL | Status: DC | PRN

## 2018-05-26 MED ORDER — IOHEXOL 350 MG/ML SOLN
INTRAVENOUS | Status: DC | PRN
  Administered 2018-05-26: 50 mL via INTRAVENOUS

## 2018-05-26 MED ORDER — ONDANSETRON HCL 4 MG/2ML IJ SOLN: 4.0000 mg | Freq: Four times a day (QID) | INTRAMUSCULAR | Status: DC | PRN

## 2018-05-26 MED ORDER — MIDAZOLAM HCL 2 MG/2ML IJ SOLN
INTRAMUSCULAR | Status: DC | PRN
  Administered 2018-05-26 (×2): 1 mg via INTRAVENOUS

## 2018-05-26 MED ORDER — SODIUM CHLORIDE 0.9 % IV SOLN: 250.0000 mL | INTRAVENOUS | Status: DC | PRN

## 2018-05-26 MED ORDER — VERAPAMIL HCL 2.5 MG/ML IV SOLN
INTRAVENOUS | Status: AC
  Filled 2018-05-26: qty 2

## 2018-05-26 MED ORDER — OXYCODONE HCL 5 MG PO TABS: 5.0000 mg | ORAL_TABLET | ORAL | Status: DC | PRN

## 2018-05-26 MED ORDER — HEPARIN SODIUM (PORCINE) 1000 UNIT/ML IJ SOLN
INTRAMUSCULAR | Status: DC | PRN
  Administered 2018-05-26: 6000 [IU] via INTRAVENOUS

## 2018-05-26 MED ORDER — FENTANYL CITRATE (PF) 100 MCG/2ML IJ SOLN
INTRAMUSCULAR | Status: DC | PRN
  Administered 2018-05-26: 25 ug via INTRAVENOUS

## 2018-05-26 MED ORDER — ENOXAPARIN SODIUM 40 MG/0.4ML ~~LOC~~ SOLN
40.0000 mg | SUBCUTANEOUS | Status: DC
  Administered 2018-05-27: 40 mg via SUBCUTANEOUS
  Filled 2018-05-26: qty 0.4

## 2018-05-26 MED ORDER — HEPARIN SODIUM (PORCINE) 1000 UNIT/ML IJ SOLN
INTRAMUSCULAR | Status: AC
  Filled 2018-05-26: qty 1

## 2018-05-26 MED ORDER — HEPARIN (PORCINE) IN NACL 1000-0.9 UT/500ML-% IV SOLN
INTRAVENOUS | Status: AC
  Filled 2018-05-26: qty 1500

## 2018-05-26 MED ORDER — SODIUM CHLORIDE 0.9 % IV SOLN
INTRAVENOUS | Status: AC
  Administered 2018-05-26: 10:00:00 via INTRAVENOUS

## 2018-05-26 MED ORDER — MIDAZOLAM HCL 2 MG/2ML IJ SOLN
INTRAMUSCULAR | Status: AC
  Filled 2018-05-26: qty 2

## 2018-05-26 MED ORDER — LABETALOL HCL 5 MG/ML IV SOLN: 10.0000 mg | INTRAVENOUS | Status: AC | PRN

## 2018-05-26 MED ORDER — LIDOCAINE HCL (PF) 1 % IJ SOLN
INTRAMUSCULAR | Status: AC
  Filled 2018-05-26: qty 30

## 2018-05-26 MED ORDER — FENTANYL CITRATE (PF) 100 MCG/2ML IJ SOLN
INTRAMUSCULAR | Status: AC
  Filled 2018-05-26: qty 2

## 2018-05-26 SURGICAL SUPPLY — 11 items
CATH 5FR JL3.5 JR4 ANG PIG MP (CATHETERS) ×2 IMPLANT
CATH BALLN WEDGE 5F 110CM (CATHETERS) ×2 IMPLANT
DEVICE RAD COMP TR BAND LRG (VASCULAR PRODUCTS) ×2 IMPLANT
GLIDESHEATH SLEND A-KIT 6F 22G (SHEATH) ×2 IMPLANT
GUIDEWIRE INQWIRE 1.5J.035X260 (WIRE) ×1 IMPLANT
INQWIRE 1.5J .035X260CM (WIRE) ×2
KIT HEART LEFT (KITS) ×2 IMPLANT
PACK CARDIAC CATHETERIZATION (CUSTOM PROCEDURE TRAY) ×2 IMPLANT
SHEATH GLIDE SLENDER 4/5FR (SHEATH) ×2 IMPLANT
TRANSDUCER W/STOPCOCK (MISCELLANEOUS) ×2 IMPLANT
TUBING CIL FLEX 10 FLL-RA (TUBING) ×2 IMPLANT

## 2018-05-26 NOTE — Progress Notes (Signed)
Progress Note  Patient Name: Drew [name] Date of Encounter: 05/26/2018  Primary Cardiologist: Rollene Rotunda, MD   Subjective   Feels well post cath.  Inpatient Medications    Scheduled Meds: . carvedilol  6.25 mg Oral BID WC  . [START ON 05/27/2018] enoxaparin (LOVENOX) injection  40 mg Subcutaneous Q24H  . pantoprazole  40 mg Oral Daily  . rosuvastatin  10 mg Oral Daily  . sodium chloride flush  3 mL Intravenous Q12H  . sodium chloride flush  3 mL Intravenous Q12H  . sodium chloride flush  3 mL Intravenous Q12H   Continuous Infusions: . sodium chloride 50 mL/hr at 05/26/18 0950  . sodium chloride     PRN Meds: sodium chloride, acetaminophen **OR** acetaminophen, albuterol, hydrALAZINE, labetalol, ondansetron (ZOFRAN) IV, oxyCODONE, senna-docusate, sodium chloride flush   Vital Signs    Vitals:   05/26/18 0924 05/26/18 0929 05/26/18 0943 05/26/18 1111  BP: (!) 139/95  (!) 123/92 109/71  Pulse: 79 (!) 0 78 80  Resp: 18 (!) 26 12 20   Temp:   97.7 F (36.5 C) (!) 97.4 F (36.3 C)  TempSrc:   Oral Oral  SpO2: (!) 89% (!) 0% 95% 94%  Weight:      Height:        Intake/Output Summary (Last 24 hours) at 05/26/2018 1230 Last data filed at 05/26/2018 1100 Gross per 24 hour  Intake 1255.4 ml  Output 860 ml  Net 395.4 ml   Last 3 Weights 05/26/2018 05/25/2018 05/23/2018  Weight (lbs) 301 lb 5.9 oz 299 lb 13.2 oz 318 lb 5.5 oz  Weight (kg) 136.7 kg 136 kg 144.4 kg      Telemetry    NSR - Personally Reviewed  ECG    None recent  Physical Exam   GEN: No acute distress.   Neck: No JVD Cardiac: RRR, no murmurs, rubs, or gallops.  Respiratory: Clear to auscultation bilaterally. GI: Soft, nontender, non-distended  MS: No edema; No deformity. TR band in place. Neuro:  Nonfocal  Psych: Normal affect   Labs    Chemistry Recent Labs  Lab 05/23/18 1732 05/25/18 0343 05/26/18 0302  NA 141 141 139  K 3.9 3.2* 3.9  CL 108 102 99  CO2 27 28 29   GLUCOSE  107* 96 104*  BUN 11 7 12   CREATININE 0.86 0.99 1.27*  1.21  CALCIUM 8.8* 9.2 8.8*  PROT 7.0  --   --   ALBUMIN 3.8  --   --   AST 16  --   --   ALT 17  --   --   ALKPHOS 58  --   --   BILITOT 1.7*  --   --   GFRNONAA >60 >60 >60  >60  GFRAA >60 >60 >60  >60  ANIONGAP 6 11 11      Hematology Recent Labs  Lab 05/23/18 1536 05/23/18 1605 05/26/18 0302  WBC 7.0  --  5.6  RBC 4.67  --  4.98  HGB 14.5 15.6 15.5  HCT 45.0 46.0 47.2  MCV 96.4  --  94.8  MCH 31.0  --  31.1  MCHC 32.2  --  32.8  RDW 15.8*  --  14.6  PLT 208  --  230    Cardiac Enzymes Recent Labs  Lab 05/23/18 1536 05/24/18 1000 05/24/18 1608 05/24/18 2214  TROPONINI 0.04* 0.03* 0.03* 0.03*   No results for input(s): TROPIPOC in the last 168 hours.   BNP Recent Labs  Lab 05/23/18 1536  BNP 310.3*     DDimer No results for input(s): DDIMER in the last 168 hours.   Radiology    No results found.  Cardiac Studies   No significant CAD.  LVEDP 24 mm Hg  Patient Profile     58 y.o. male with nonischemic cardiomyopathy.   Assessment & Plan    1. Chronic systolic heart failure: Watch Cr. Start Entresto in AM.  WIll need more diuresis once Cr is stable.  LVEDP was elevated.   2. Rosuvastatin for lipids.  3. Elevated troponin: demand ischemia. D/w Dr. Waymon Amato     For questions or updates, please contact [facility] Please consult [email or web address] for contact info under        Signed, Lance Muss, MD  05/26/2018, 12:30 PM

## 2018-05-26 NOTE — CV Procedure (Signed)
   Left and right heart cath via right antecubital and right radial vein and artery respectively using real-time vascular ultrasound for access.  Normal coronary arteries.  Mild pulmonary hypertension with mean PA 36 mmHg.  Mean pulmonary capillary wedge pressure 23.  LVEDP 19 mmHg.  Findings consistent with acute on chronic systolic heart failure.  Known EF less than 30%.  Additional up titration of heart failure therapy to include ARNI/MRA/beta-blocker/?  SGLT2 dapagliflozin.  Diuretic therapy as needed for volume equilibration.

## 2018-05-26 NOTE — Progress Notes (Signed)
Removed TR Band, no bleeding on site. Applied dressing on Rt. RAD. HS [employer]

## 2018-05-26 NOTE — Progress Notes (Signed)
PROGRESS NOTE   Drew [name]  DPO:[id number]    DOB: [birth year]    DOA: 05/23/2018  PCP: Marisue Brooklyn   I have briefly reviewed patients previous medical records in [facility].  Brief Narrative:  58 year old married male, lives with spouse and family, independent, Radiographer, therapeutic employee, PMH of HTN, HLD, tobacco abuse, alcohol use, obesity, initially presented to [facility] ED on 05/23/2018 due to 3 weeks history of progressively worsening dyspnea, orthopnea, PND and leg edema.  Admitted for new onset acute CHF.  TTE reveals dilated cardiomyopathy with low EF.  Cardiology consulted.  Improved after IV diuresis.  Status post cardiac cath 5/11, normal coronaries.  Assessment & Plan:   Principal Problem:   New onset of congestive heart failure (HCC) Active Problems:   Essential hypertension   Hyperlipidemia   Tobacco abuse   Alcohol use   Obesity, Class III, BMI 40-49.9 (morbid obesity) (HCC)   Acute CHF (congestive heart failure) (HCC)   1. Acute systolic and diastolic CHF/cardiomyopathy: Etiology/DD: Longstanding poorly controlled HTN, alcohol abuse.  TTE 5/9: LVEF 20-25% with diffuse hypokinesis and grade 2 diastolic dysfunction.  Treated with IV Lasix 60 mg every 12 hours.  -9 L since admission.  Down by 26 pounds since admission.  Volume status much improved.  Cardiology consultation appreciated.  Started carvedilol 6.25 mg twice daily with plans to start Entresto and Aldactone post-cath.  Urine appears concentrated and output decreased per patient.  Also creatinine has increased from 0.99-1.27.  Discontinued IV Lasix.  Cardiac cath 5/11 shows normal coronary arteries and findings consistent with acute on chronic systolic CHF.  Discussed with Dr. Eldridge Dace, will follow and make medication adjustments related to goal-directed medical therapy, monitor overnight to ensure clinical stability and stable creatinine before considering possible discharge 5/12.  Updated patient who verbalizes  understanding. 2. Hypokalemia: Secondary to aggressive IV diuresis.  Replaced.  Magnesium normal. 3. Essential hypertension: Amlodipine discontinued by cardiology and started carvedilol 6.25 mg twice daily.  Mildly uncontrolled.  Monitor.  Considering Entresto and Aldactone post-cath, await cardiology follow-up. 4. Acute respiratory failure with hypoxia: Secondary to decompensated CHF complicating possible underlying OSA.  Will reassess home oxygen requirement prior to discharge. 5. Hyperlipidemia: LDL 101.  Resumed Crestor which he had stopped taking. 6. Morbid obesity/Body mass index is 42.03 kg/m.  Needs to diet, exercise and weight loss. 7. Suspected OSA: Outpatient evaluation with sleep study. 8. Tobacco abuse: Cessation counseled.  Declines nicotine patch. 9. Alcohol use/?  Abuse: No overt withdrawal. 10. Acute kidney injury: Secondary to aggressive IV diuresis.  Diuretics hold.  Also received IV contrast during cardiac cath.  Follow BMP in a.m.   DVT prophylaxis: Lovenox Code Status: Full Family Communication: Patient kindly declined MDs offer to speak with spouse to update care. Disposition: DC home pending further clinical improvement and cardiology clearance, hopefully on 5/12.     Consultants:  Cardiology  Procedures:  Cardiac cath 05/26/2018:   LV end diastolic pressure is moderately elevated.  There is severe left ventricular systolic dysfunction.  Hemodynamic findings consistent with mild pulmonary hypertension.    Right dominant coronary anatomy with widely patent left main, LAD, circumflex, and RCA.  Acute on chronic systolic heart failure with mildly to moderately elevated filling pressures mean wedge 20 mmHg and mean LVEDP 24 mmHg.  Echo documented LVEF less than 30%.  Mild to moderate pulmonary hypertension, cardiac output by Fick 7.7 L/min.  Right atrial mean 6 mmHg.  No evidence of right heart failure.  RECOMMENDATIONS:  Guideline directed therapy  for systolic heart failure: ARNI/MRA/BETA-blocker therapy.  Also consider SGLT2, dapagliflozin, not yet a guideline recommendation.  Weight loss.  Screen for sleep apnea.  Antimicrobials:  None   Subjective: Patient seen post cath.  Denies dyspnea or leg cramps.  Indicates that he is not been making much urine as before while on IV Lasix.  ROS: As above, otherwise negative.  Objective:  Vitals:   05/26/18 0924 05/26/18 0929 05/26/18 0943 05/26/18 1111  BP: (!) 139/95  (!) 123/92 109/71  Pulse: 79 (!) 0 78 80  Resp: 18 (!) Temp:   97.7 F (36.5 C) (!) 97.4 F (36.3 C)  TempSrc:   Oral Oral  SpO2: (!) 89% (!) 0% 95% 94%  Weight:      Height:        Examination:  General exam: Pleasant young male, moderately built and obese sitting up comfortably in bed. Respiratory system: Clear to auscultation.  No increased work of breathing. Cardiovascular system: S1 & S2 heard, RRR. No JVD, murmurs, rubs, gallops or clicks.  Trace ankle edema.  Telemetry personally reviewed: Sinus rhythm. Gastrointestinal system: Abdomen is nondistended, soft and nontender. No organomegaly or masses felt. Normal bowel sounds heard.  Stable Central nervous system: Alert and oriented. No focal neurological deficits.  Stable Extremities: Symmetric 5 x 5 power. Skin: No rashes, lesions or ulcers Psychiatry: Judgement and insight appear normal. Mood & affect appropriate.     Data Reviewed: I have personally reviewed following labs and imaging studies  CBC: Recent Labs  Lab 05/23/18 1536 05/23/18 1605  WBC 7.0  --   NEUTROABS 4.7  --   HGB 14.5 15.6  HCT 45.0 46.0  MCV 96.4  --   PLT 208  --    Basic Metabolic Panel: Recent Labs  Lab 05/23/18 1605 05/23/18 1732 05/25/18 0343 05/26/18 0302  NA 142 141 141 139  K 4.0 3.9 3.2* 3.9  CL  --  108 102 99  CO2  --  GLUCOSE  --  107* 96 104*  BUN  --  CREATININE  --  0.86 0.99 1.27*  1.21  CALCIUM  --  8.8* 9.2  8.8*  MG  --   --   --  2.1   Liver Function Tests: Recent Labs  Lab 05/23/18 1732  AST 16  ALT 17  ALKPHOS 58  BILITOT 1.7*  PROT 7.0  ALBUMIN 3.8   Coagulation Profile: Recent Labs  Lab 05/23/18 1536  INR 1.0   Cardiac Enzymes: Recent Labs  Lab 05/23/18 1536 05/24/18 1000 05/24/18 1608 05/24/18 2214  TROPONINI 0.04* 0.03* 0.03* 0.03*     Recent Results (from the past 240 hour(s))  SARS Coronavirus 2 (Hosp order,Performed in [facility] lab via [employer] ID)     Status: None   Collection Time: 05/23/18  4:04 PM  Result Value Ref Range Status   SARS Coronavirus 2 ([employer] Now) NEGATIVE NEGATIVE Final    Comment: (NOTE) Interpretive Result Comment(s): COVID 19 Positive SARS CoV 2 target nucleic acids are DETECTED. The SARS CoV 2 RNA is generally detectable in upper and lower respiratory specimens during the acute phase of infection.  Positive results are indicative of active infection with SARS CoV 2.  Clinical correlation with patient history and other diagnostic information is necessary to determine patient infection status.  Positive results do not rule out bacterial infection or coinfection with other viruses. The  expected result is Negative. COVID 19 Negative SARS CoV 2 target nucleic acids are NOT DETECTED. The SARS CoV 2 RNA is generally detectable in upper and lower respiratory specimens during the acute phase of infection.  Negative results do not preclude SARS CoV 2 infection, do not rule out coinfections with other pathogens, and should not be used as the sole basis for treatment or other patient management decisions.  Negative results must be combined with clinical  observations, patient history, and epidemiological information. The expected result is Negative. Invalid Presence or absence of SARS CoV 2 nucleic acids cannot be determined. Repeat testing was performed on the submitted specimen and repeated Invalid results were obtained.  If  clinically indicated, additional testing on a new specimen with an alternate test methodology [id number]) is advised.  The SARS CoV 2 RNA is generally detectable in upper and lower respiratory specimens during the acute phase of infection. The expected result is Negative. Fact Sheet for Patients:  http://www.graves-ford.org/ Fact Sheet for Healthcare Providers: EnviroConcern.si This test is not yet approved or cleared by the [place] FDA and has been authorized for detection and/or diagnosis of SARS CoV 2 by FDA under an Emergency Use Authorization (EUA).  This EUA will remain in effect (meaning this test can be used) for the duration of the COVID19 d eclaration under Section 564(b)(1) of the Act, 21 U.S.C. section 918 079 2242 3(b)(1), unless the authorization is terminated or revoked sooner. Performed at [facility], [address] [place], [place] [address]   MRSA PCR Screening     Status: None   Collection Time: 05/23/18 10:09 PM  Result Value Ref Range Status   MRSA by PCR NEGATIVE NEGATIVE Final    Comment:        The GeneXpert MRSA Assay (FDA approved for NASAL specimens only), is one component of a comprehensive MRSA colonization surveillance program. It is not intended to diagnose MRSA infection nor to guide or monitor treatment for MRSA infections. Performed at [facility], [address] [place], [place] [address]          Radiology Studies: No results found.      Scheduled Meds: . carvedilol  6.25 mg Oral BID WC  . [START ON 05/27/2018] enoxaparin (LOVENOX) injection  40 mg Subcutaneous Q24H  . pantoprazole  40 mg Oral Daily  . potassium chloride  40 mEq Oral BID  . rosuvastatin  10 mg Oral Daily  . sodium chloride flush  3 mL Intravenous Q12H  . sodium chloride flush  3 mL Intravenous Q12H  . sodium chloride flush  3 mL Intravenous Q12H   Continuous Infusions: . sodium chloride 50 mL/hr at  05/26/18 0950  . sodium chloride       LOS: 2 days     Marcellus Scott, MD, FACP, [facility]. [facility]  To contact the attending provider between 7A-7P or the covering provider during after hours 7P-7A, please log into the web site [email or web address] and access using universal [place] password for that web site. If you do not have the password, please call the hospital operator.  05/26/2018, 11:44 AM

## 2018-05-26 NOTE — Interval H&P Note (Signed)
Cath Lab Visit (complete for each Cath Lab visit)  Clinical Evaluation Leading to the Procedure:   ACS: No.  Non-ACS:    Anginal Classification: CCS III  Anti-ischemic medical therapy: Minimal Therapy (1 class of medications)  Non-Invasive Test Results: High-risk stress test findings: cardiac mortality >3%/year  Prior CABG: No previous CABG      History and Physical Interval Note:  05/26/2018 8:36 AM  Drew [name]  has presented today for surgery, with the diagnosis of unstable angina.  The various methods of treatment have been discussed with the patient and family. After consideration of risks, benefits and other options for treatment, the patient has consented to  Procedure(s): RIGHT/LEFT HEART CATH AND CORONARY ANGIOGRAPHY (N/A) as a surgical intervention.  The patient's history has been reviewed, patient examined, no change in status, stable for surgery.  I have reviewed the patient's chart and labs.  Questions were answered to the patient's satisfaction.     Lyn Records III

## 2018-05-27 ENCOUNTER — Encounter (HOSPITAL_COMMUNITY): Payer: Self-pay | Admitting: Interventional Cardiology

## 2018-05-27 ENCOUNTER — Other Ambulatory Visit: Payer: Self-pay | Admitting: Cardiology

## 2018-05-27 DIAGNOSIS — I429 Cardiomyopathy, unspecified: Secondary | ICD-10-CM

## 2018-05-27 DIAGNOSIS — J9601 Acute respiratory failure with hypoxia: Secondary | ICD-10-CM

## 2018-05-27 DIAGNOSIS — E876 Hypokalemia: Secondary | ICD-10-CM

## 2018-05-27 DIAGNOSIS — Z79899 Other long term (current) drug therapy: Secondary | ICD-10-CM

## 2018-05-27 DIAGNOSIS — N179 Acute kidney failure, unspecified: Secondary | ICD-10-CM

## 2018-05-27 LAB — BASIC METABOLIC PANEL
Anion gap: 13 (ref 5–15)
BUN: 12 mg/dL (ref 6–20)
CO2: 25 mmol/L (ref 22–32)
Calcium: 8.6 mg/dL — ABNORMAL LOW (ref 8.9–10.3)
Chloride: 99 mmol/L (ref 98–111)
Creatinine, Ser: 1.12 mg/dL (ref 0.61–1.24)
GFR calc Af Amer: >60 mL/min (ref >60)
GFR calc non Af Amer: >60 mL/min (ref >60)
Glucose, Bld: 92 mg/dL (ref 70–99)
Potassium: 4.1 mmol/L (ref 3.5–5.1)
Sodium: 137 mmol/L (ref 135–145)

## 2018-05-27 MED ORDER — FUROSEMIDE 40 MG PO TABS
40.0000 mg | ORAL_TABLET | Freq: Two times a day (BID) | ORAL | Status: DC
  Administered 2018-05-27: 16:00:00 40 mg via ORAL
  Filled 2018-05-27: qty 1

## 2018-05-27 MED ORDER — SACUBITRIL-VALSARTAN 24-26 MG PO TABS
1.0000 | ORAL_TABLET | Freq: Two times a day (BID) | ORAL | Status: DC
  Administered 2018-05-27: 1 via ORAL
  Filled 2018-05-27: qty 1

## 2018-05-27 MED ORDER — ROSUVASTATIN CALCIUM 10 MG PO TABS: 10.0000 mg | ORAL_TABLET | Freq: Every day | ORAL | 0 refills | Status: DC

## 2018-05-27 MED ORDER — CARVEDILOL 6.25 MG PO TABS: 6.2500 mg | ORAL_TABLET | Freq: Two times a day (BID) | ORAL | 0 refills | Status: DC

## 2018-05-27 MED ORDER — FUROSEMIDE 40 MG PO TABS: 40.0000 mg | ORAL_TABLET | Freq: Two times a day (BID) | ORAL | 0 refills | Status: DC

## 2018-05-27 MED ORDER — SACUBITRIL-VALSARTAN 24-26 MG PO TABS: 1.0000 | ORAL_TABLET | Freq: Two times a day (BID) | ORAL | 0 refills | Status: DC

## 2018-05-27 MED FILL — ENTRESTO 24 MG-26 MG TABLET: 24-26 | 30 days supply | Qty: 60 | Fill #0

## 2018-05-27 MED FILL — CARVEDILOL 6.25 MG TABLET: 6.25 | 30 days supply | Qty: 60 | Fill #0

## 2018-05-27 MED FILL — FUROSEMIDE 40 MG TABLET: 40 | 30 days supply | Qty: 60 | Fill #0

## 2018-05-27 NOTE — Plan of Care (Signed)
  Problem: Education: Goal: Knowledge of General Education information will improve Description Including pain rating scale, medication(s)/side effects and non-pharmacologic comfort measures Outcome: Completed/Met   Problem: Health Behavior/Discharge Planning: Goal: Ability to manage health-related needs will improve Outcome: Completed/Met   Problem: Clinical Measurements: Goal: Ability to maintain clinical measurements within normal limits will improve 05/27/2018 1453 by Don Perking, RN Outcome: Completed/Met 05/27/2018 0743 by Don Perking, RN Outcome: Progressing Goal: Will remain free from infection 05/27/2018 1453 by Don Perking, RN Outcome: Completed/Met 05/27/2018 0743 by Don Perking, RN Outcome: Progressing Goal: Diagnostic test results will improve 05/27/2018 1453 by Don Perking, RN Outcome: Completed/Met 05/27/2018 0743 by Don Perking, RN Outcome: Progressing Goal: Respiratory complications will improve 05/27/2018 1453 by Don Perking, RN Outcome: Completed/Met 05/27/2018 0743 by Don Perking, RN Outcome: Progressing Goal: Cardiovascular complication will be avoided 05/27/2018 1453 by Don Perking, RN Outcome: Completed/Met 05/27/2018 0743 by Don Perking, RN Outcome: Progressing   Problem: Activity: Goal: Risk for activity intolerance will decrease 05/27/2018 1453 by Don Perking, RN Outcome: Completed/Met 05/27/2018 0743 by Don Perking, RN Outcome: Progressing   Problem: Nutrition: Goal: Adequate nutrition will be maintained Outcome: Completed/Met   Problem: Coping: Goal: Level of anxiety will decrease Outcome: Completed/Met   Problem: Elimination: Goal: Will not experience complications related to bowel motility Outcome: Completed/Met Goal: Will not experience complications related to urinary retention Outcome: Completed/Met   Problem: Pain Managment: Goal: General experience of  comfort will improve Outcome: Completed/Met   Problem: Safety: Goal: Ability to remain free from injury will improve Outcome: Completed/Met   Problem: Skin Integrity: Goal: Risk for impaired skin integrity will decrease 05/27/2018 1453 by Don Perking, RN Outcome: Completed/Met 05/27/2018 0743 by Don Perking, RN Outcome: Progressing   Problem: Education: Goal: Ability to demonstrate management of disease process will improve Outcome: Completed/Met Goal: Ability to verbalize understanding of medication therapies will improve 05/27/2018 1453 by Don Perking, RN Outcome: Completed/Met 05/27/2018 0743 by Don Perking, RN Outcome: Progressing Goal: Individualized Educational Video(s) Outcome: Completed/Met   Problem: Activity: Goal: Capacity to carry out activities will improve 05/27/2018 1453 by Don Perking, RN Outcome: Completed/Met 05/27/2018 0743 by Don Perking, RN Outcome: Progressing   Problem: Cardiac: Goal: Ability to achieve and maintain adequate cardiopulmonary perfusion will improve 05/27/2018 1453 by Don Perking, RN Outcome: Completed/Met 05/27/2018 0743 by Don Perking, RN Outcome: Progressing   Problem: Education: Goal: Understanding of CV disease, CV risk reduction, and recovery process will improve 05/27/2018 1453 by Don Perking, RN Outcome: Completed/Met 05/27/2018 0743 by Don Perking, RN Outcome: Progressing Goal: Individualized Educational Video(s) 05/27/2018 1453 by Don Perking, RN Outcome: Completed/Met 05/27/2018 0743 by Don Perking, RN Outcome: Progressing   Problem: Activity: Goal: Ability to return to baseline activity level will improve Outcome: Completed/Met   Problem: Cardiovascular: Goal: Ability to achieve and maintain adequate cardiovascular perfusion will improve Outcome: Completed/Met Goal: Vascular access site(s) Level 0-1 will be maintained Outcome:  Completed/Met   Problem: Health Behavior/Discharge Planning: Goal: Ability to safely manage health-related needs after discharge will improve 05/27/2018 1453 by Don Perking, RN Outcome: Completed/Met 05/27/2018 0743 by Don Perking, RN Outcome: Progressing

## 2018-05-27 NOTE — Progress Notes (Signed)
SATURATION QUALIFICATIONS: (This note is used to comply with regulatory documentation for home oxygen)  Patient Saturations on Room Air at Rest = 94%  Patient Saturations on Room Air while Ambulating = 85%  Patient Saturations on 2 Liters of oxygen while Ambulating = 91%  Please briefly explain why patient needs home oxygen:  

## 2018-05-27 NOTE — Plan of Care (Signed)
  Problem: Clinical Measurements: Goal: Ability to maintain clinical measurements within normal limits will improve Outcome: Progressing Goal: Will remain free from infection Outcome: Progressing Goal: Diagnostic test results will improve Outcome: Progressing Goal: Respiratory complications will improve Outcome: Progressing Goal: Cardiovascular complication will be avoided Outcome: Progressing   Problem: Activity: Goal: Risk for activity intolerance will decrease Outcome: Progressing   Problem: Skin Integrity: Goal: Risk for impaired skin integrity will decrease Outcome: Progressing   Problem: Education: Goal: Ability to verbalize understanding of medication therapies will improve Outcome: Progressing   Problem: Activity: Goal: Capacity to carry out activities will improve Outcome: Progressing   Problem: Cardiac: Goal: Ability to achieve and maintain adequate cardiopulmonary perfusion will improve Outcome: Progressing   Problem: Education: Goal: Understanding of CV disease, CV risk reduction, and recovery process will improve Outcome: Progressing Goal: Individualized Educational Video(s) Outcome: Progressing   Problem: Health Behavior/Discharge Planning: Goal: Ability to safely manage health-related needs after discharge will improve Outcome: Progressing

## 2018-05-27 NOTE — Progress Notes (Signed)
Pt discharges to home with belongings, explained and discussed discharge instructions, advance came and talked about oxygen machine, TOC pharmacy dropped medication to patient. No complaints at this time.

## 2018-05-27 NOTE — Discharge Instructions (Signed)

## 2018-05-27 NOTE — TOC Initial Note (Addendum)
Transition of Care [facility]) - Initial/Assessment Note    Patient Details  Name: Drew [name] MRN: [id number] Date of Birth: December 21, 1960  Transition of Care [facility]) CM/SW Contact:    Cherylann Parr, RN Phone Number: 05/27/2018, 1:43 PM  Clinical Narrative:     PTA independent from home with wife.  Pt confirms he has PCP and denied barriers with paying for prescription medications.  TOC will deliver discharge medications prior to discharge (TOC will provide ongoing copay information and will reach out to attending if prior auth is required).  Pt in agreement with home oxygen - pt given choice per medicare.gov - pt chose [facility] - agency accepted referral.              Expected Discharge Plan: Home/Self Care Barriers to Discharge: Barriers Resolved   Patient Goals and CMS Choice Patient states their goals for this hospitalization and ongoing recovery are:: (Pt is ready to get back to work) [employer].gov Compare Post Acute Care list provided to:: Patient Choice offered to / list presented to : Patient  Expected Discharge Plan and Services Expected Discharge Plan: Home/Self Care     Post Acute Care Choice: Durable Medical Equipment Living arrangements for the past 2 months: Single Family Home                 DME Arranged: Oxygen, [employer] DME Agency: [facility] Date DME Agency Contacted: 05/27/18 Time DME Agency Contacted: 1300 Representative spoke with at DME Agency: Zack            Prior Living Arrangements/Services Living arrangements for the past 2 months: Single Family Home Lives with:: Spouse Patient language and need for interpreter reviewed:: Yes Do you feel safe going back to the place where you live?: Yes      Need for Family Participation in Patient Care: No (Comment) Care giver support system in place?: Yes (comment)   Criminal Activity/Legal Involvement Pertinent to Current Situation/Hospitalization: No - Comment as needed  Activities of Daily  Living Home Assistive Devices/Equipment: None ADL Screening (condition at time of admission) Patient's cognitive ability adequate to safely complete daily activities?: No Is the patient deaf or have difficulty hearing?: No Does the patient have difficulty seeing, even when wearing glasses/contacts?: No Does the patient have difficulty concentrating, remembering, or making decisions?: No Patient able to express need for assistance with ADLs?: No Does the patient have difficulty dressing or bathing?: No Independently performs ADLs?: No Communication: Independent Grooming: Independent Feeding: Independent Bathing: Independent Toileting: Independent In/Out Bed: Independent Walks in Home: Independent Does the patient have difficulty walking or climbing stairs?: No Weakness of Legs: None Weakness of Arms/Hands: None  Permission Sought/Granted   Permission granted to share information with : Yes, Verbal Permission Granted     Permission granted to share info w AGENCY: [facility]        Emotional Assessment   Attitude/Demeanor/Rapport: Engaged, Self-Confident, Gracious Affect (typically observed): Accepting, Adapt[facility] Orientation: : Oriented to Self, Oriented to Place, Oriented to  Time, Oriented to Situation      Admission diagnosis:  Hypoxia [R09.02] Acute congestive heart failure, unspecified heart failure type [facility]) [I50.9] Patient Active Problem List   Diagnosis Date Noted  . Essential hypertension 05/24/2018  . Hyperlipidemia 05/24/2018  . Tobacco abuse 05/24/2018  . Alcohol use 05/24/2018  . Obesity, Class III, BMI 40-49.9 (morbid obesity) (HCC) 05/24/2018  . Acute CHF (congestive heart failure) (HCC) 05/24/2018  . New onset of congestive heart failure (HCC) 05/23/2018  . Wellness  examination 02/26/2014  . Knee pain, left 02/11/2014  . Seasonal allergies 02/11/2014   PCP:  Esperanza Richters, PA-C Pharmacy:   [facility] [phone] [place], [place] - [phone] AT  [facility] [address] [place] [address] Phone: [phone] Fax: [phone]  [facility] - [place], [place] - [address] [place] [address] Phone: [phone] Fax: [phone]     Social Determinants of Health (SDOH) Interventions    Readmission Risk Interventions No flowsheet data found.

## 2018-05-27 NOTE — Discharge Summary (Addendum)
Physician Discharge Summary  Drew [name] UJW:[id number] DOB: 08/28/1960  PCP: Esperanza Richters, PA-C  Admit date: 05/23/2018 Discharge date: 05/27/2018  Recommendations for Outpatient Follow-up:  1. Dr. Rollene Rotunda, [facility] on 06/02/2018 10:20 AM with repeat labs (CBC & BMP).  This will be a telehealth visit. 2. Esperanza Richters, PA-C/PCP in 2 weeks. 3. Recommend outpatient evaluation for possible sleep apnea with sleep study.  Home Health: None Equipment/Devices: Home oxygen at 2 L/min via nasal cannula, especially with activity.  Discharge Condition: Improved and stable CODE STATUS: Full Diet recommendation: Heart healthy diet.  Discharge Diagnoses:  Principal Problem:   New onset of congestive heart failure (HCC) Active Problems:   Essential hypertension   Hyperlipidemia   Tobacco abuse   Alcohol use   Obesity, Class III, BMI 40-49.9 (morbid obesity) (HCC)   Acute CHF (congestive heart failure) (HCC)   Brief Summary: 58 year old married male, lives with spouse and family, independent, Radiographer, therapeutic employee, PMH of HTN, HLD, tobacco abuse, alcohol use, obesity, [facility] due to 3 weeks history of progressively worsening dyspnea, orthopnea, PND and leg edema.  Admitted for new onset acute CHF.  TTE reveals dilated cardiomyopathy with low EF.  Cardiology consulted.  Improved after IV diuresis.  Status post cardiac cath 5/11, normal coronaries.  Assessment & Plan:  1. Acute systolic and diastolic CHF/nonischemic cardiomyopathy: Etiology/DD: Longstanding poorly controlled HTN, alcohol abuse.  TTE 5/9: LVEF 20-25% with diffuse hypokinesis and grade 2 diastolic dysfunction.  Treated with IV Lasix 60 mg every 12 hours.  - 8 L since admission.  Down by 26 pounds since admission.  Volume status much improved.  Cardiology consultation appreciated.  Started carvedilol 6.25 mg twice daily.  Urine appears concentrated and output decreased per patient.  Also  creatinine has increased from 0.99-1.27.  Discontinued IV Lasix 5/11.  Cardiac cath 5/11 shows normal coronary arteries and findings consistent with acute on chronic systolic CHF.  Volume status significantly improved.  Cardiology has seen him and cleared for discharge on Lasix 40 mg twice daily, may cut back to once a day if he has sudden drop in weight or dizziness, carvedilol 6.25 mg twice daily, Entresto 24-26, 1 tablet twice daily and Crestor 10 mg daily.  Cardiology has arranged close outpatient follow-up with repeat labs.  I discussed with cardiology who recommend that patient needs to be out of work for at least 1 to 2 weeks until he recuperates from this acute illness. 2. Hypokalemia: Secondary to aggressive IV diuresis.  Replaced.  Magnesium normal. 3. Essential hypertension: Continue carvedilol and Entresto.  Monitor closely. 4. Acute respiratory failure with hypoxia: Secondary to decompensated CHF complicating possible underlying OSA.  Patient desaturated to 85% on room air with activity and qualified for home oxygen at discharge.  Case management consulted. 5. Hyperlipidemia: LDL 101.  Continue Crestor and may need to adjust dose as outpatient. 6. Morbid obesity/Body mass index is 42.03 kg/m.  Needs to diet, exercise and weight loss which was discussed in detail with patient and he verbalized understanding. 7. Suspected OSA: Outpatient evaluation with sleep study. 8. Tobacco abuse: Cessation counseled.  Declines nicotine patch.  Patient never filled prescription for Wellbutrin which was given to him at the end of last year. 9. Alcohol use/?  Abuse: No overt withdrawal.  Abstinence counseled. 10. Acute kidney injury: Secondary to aggressive IV diuresis.  Diuretics hold.  Also received IV contrast during cardiac cath.    Resolved.  Follow BMP closely as outpatient due  to diuretics and Entresto.   Consultants:  Cardiology  Procedures:  Cardiac cath 05/26/2018:   LV end diastolic  pressure is moderately elevated.  There is severe left ventricular systolic dysfunction.  Hemodynamic findings consistent with mild pulmonary hypertension.   Right dominant coronary anatomy with widely patent left main, LAD, circumflex, and RCA.  Acute on chronic systolic heart failure with mildly to moderately elevated filling pressures mean wedge 20 mmHg and mean LVEDP 24 mmHg.  Echo documented LVEF less than 30%.  Mild to moderate pulmonary hypertension, cardiac output by Fick 7.7 L/min. Right atrial mean 6 mmHg. No evidence of right heart failure.  RECOMMENDATIONS:   Guideline directed therapy for systolic heart failure: ARNI/MRA/BETA-blocker therapy. Also consider SGLT2, dapagliflozin, not yet a guideline recommendation.  Weight loss. Screen for sleep apnea.   Discharge Instructions  Discharge Instructions    (HEART FAILURE PATIENTS) Call MD:  Anytime you have any of the following symptoms: 1) 3 pound weight gain in 24 hours or 5 pounds in 1 week 2) shortness of breath, with or without a dry hacking cough 3) swelling in the hands, feet or stomach 4) if you have to sleep on extra pillows at night in order to breathe.   Complete by:  As directed    Call MD for:  difficulty breathing, headache or visual disturbances   Complete by:  As directed    Call MD for:  extreme fatigue   Complete by:  As directed    Call MD for:  persistant dizziness or light-headedness   Complete by:  As directed    Diet - low sodium heart healthy   Complete by:  As directed    Increase activity slowly   Complete by:  As directed        Medication List    STOP taking these medications   buPROPion 150 MG 24 hr tablet Commonly known as:  WELLBUTRIN XL     TAKE these medications   albuterol 108 (90 Base) MCG/ACT inhaler Commonly known as:  VENTOLIN HFA Inhale 2 puffs into the lungs every 6 (six) hours as needed for wheezing or shortness of breath.   carvedilol 6.25 MG  tablet Commonly known as:  COREG Take 1 tablet (6.25 mg total) by mouth 2 (two) times daily with a meal.   furosemide 40 MG tablet Commonly known as:  LASIX Take 1 tablet (40 mg total) by mouth 2 (two) times daily. If sudden drop in weight or dizziness, cut back to once a day Lasix.   pantoprazole 40 MG tablet Commonly known as:  PROTONIX Take 1 tablet (40 mg total) by mouth daily.   rosuvastatin 10 MG tablet Commonly known as:  CRESTOR Take 1 tablet (10 mg total) by mouth daily.   sacubitril-valsartan 24-26 MG Commonly known as:  ENTRESTO Take 1 tablet by mouth 2 (two) times daily.      Follow-up Information    Rollene Rotunda, MD Follow up on 06/02/2018.   Specialty:  Cardiology Why:  at 10:20am for your follow up appt. This will be a telehealth visit through your mobile phone.  Contact information: [address] [place] [address] [phone]        [facility] Follow up on 06/02/2018.   Specialty:  Cardiology Why:  Please come in between 9-4 for follow up labs. You will need to wear a mask into the building Contact information: [address] [place] [address] [phone]  Saguier, Ramon DredgeEdward, PA-C. Schedule an appointment as soon as possible for a visit in 2 week(s).   Specialties:  Internal Medicine, Family Medicine Contact information: [address] [place] [id number] [phone]          Allergies  Allergen Reactions  . Tetracyclines & Related Hives and Itching      Procedures/Studies: Dg Chest 2 View  Result Date: 05/23/2018 CLINICAL DATA:  58 year old male with a history of shortness of breath EXAM: CHEST - 2 VIEW COMPARISON:  12/17/2017 FINDINGS: Cardiomediastinal silhouette unchanged in size and contour. Hazy mixed interstitial and airspace opacities of the bilateral lungs. Blunting of the costophrenic sulcus on the lateral view. No pneumothorax. IMPRESSION: Hazy  mixed interstitial airspace opacities the bilateral lungs, with the differential including both pulmonary edema and multifocal infection. Electronically Signed   By: Gilmer MorJaime  Wagner D.O.   On: 05/23/2018 16:22      Subjective: Patient reports feeling much better compared to admission.  Denies complaints.  Denies dyspnea, chest pain, dizziness or lightheadedness.  Has been ambulating comfortably in the room without distress.  As per RN, no acute issues noted.  Discharge Exam:  Vitals:   05/27/18 0723 05/27/18 0745 05/27/18 1030 05/27/18 1107  BP: (!) 112/93   124/76  Pulse: 75 69 73   Resp: (!) 22 17 16    Temp: 97.9 F (36.6 C)   97.9 F (36.6 C)  TempSrc: Oral   Oral  SpO2: 100% 92% 97%   Weight:      Height:        General exam: Pleasant young male, moderately built and obese sitting up comfortably in reclining chair this morning. Respiratory system: Clear to auscultation.  No increased work of breathing. Cardiovascular system: S1 & S2 heard, RRR. No JVD, murmurs, rubs, gallops or clicks.  Trace ankle edema.  Telemetry personally reviewed: Sinus rhythm. Gastrointestinal system: Abdomen is nondistended, soft and nontender. No organomegaly or masses felt. Normal bowel sounds heard.   Central nervous system: Alert and oriented. No focal neurological deficits.  Extremities: Symmetric 5 x 5 power. Skin: No rashes, lesions or ulcers Psychiatry: Judgement and insight appear normal. Mood & affect appropriate.     The results of significant diagnostics from this hospitalization (including imaging, microbiology, ancillary and laboratory) are listed below for reference.     Microbiology: Recent Results (from the past 240 hour(s))  SARS Coronavirus 2 (Hosp order,[facility] via [employer] [facility])     Status: None   Collection Time: 05/23/18  4:04 PM  Result Value Ref Range Status   SARS Coronavirus 2 ([employer] Now) NEGATIVE NEGATIVE Final    Comment: (NOTE) Interpretive  Result Comment(s): COVID 19 Positive SARS CoV 2 target nucleic acids are DETECTED. The SARS CoV 2 RNA is generally detectable in upper and lower respiratory specimens during the acute phase of infection.  Positive results are indicative of active infection with SARS CoV 2.  Clinical correlation with patient history and other diagnostic information is necessary to determine patient infection status.  Positive results do not rule out bacterial infection or coinfection with other viruses. The expected result is Negative. COVID 19 Negative SARS CoV 2 target nucleic acids are NOT DETECTED. The SARS CoV 2 RNA is generally detectable in upper and lower respiratory specimens during the acute phase of infection.  Negative results do not preclude SARS CoV 2 infection, do not rule out coinfections with other pathogens, and should not be used as the  sole basis for treatment or other patient management decisions.  Negative results must be combined with clinical  observations, patient history, and epidemiological information. The expected result is Negative. Invalid Presence or absence of SARS CoV 2 nucleic acids cannot be determined. Repeat testing was performed on the submitted specimen and repeated Invalid results were obtained.  If clinically indicated, additional testing on a new specimen with an alternate test methodology [phone]) is advised.  The SARS CoV 2 RNA is generally detectable in upper and lower respiratory specimens during the acute phase of infection. The expected result is Negative. Fact Sheet for Patients:  http://www.graves-ford.org/ Fact Sheet for Healthcare Providers: EnviroConcern.si This test is not yet approved or cleared by the [place] FDA and has been authorized for detection and/or diagnosis of SARS CoV 2 by FDA under an Emergency Use Authorization (EUA).  This EUA will remain in effect (meaning this test can be used)  for the duration of the COVID19 d eclaration under Section 564(b)(1) of the Act, 21 U.S.C. section [phone] 3(b)(1), unless the authorization is terminated or revoked sooner. Performed at [facility], [address] [place], [place] [address]   MRSA PCR Screening     Status: None   Collection Time: 05/23/18 10:09 PM  Result Value Ref Range Status   MRSA by PCR NEGATIVE NEGATIVE Final    Comment:        The GeneXpert MRSA Assay (FDA approved for NASAL specimens only), is one component of a comprehensive MRSA colonization surveillance program. It is not intended to diagnose MRSA infection nor to guide or monitor treatment for MRSA infections. Performed at [facility], [address] [place], [place] [address]      Labs: CBC: Recent Labs  Lab 05/23/18 1536 05/23/18 1605 05/26/18 0302 05/26/18 0913 05/26/18 0915 05/26/18 0918  WBC 7.0  --  5.6  --   --   --   NEUTROABS 4.7  --   --   --   --   --   HGB 14.5 15.6 15.5 16.0 16.3 16.0  HCT 45.0 46.0 47.2 47.0 48.0 47.0  MCV 96.4  --  94.8  --   --   --   PLT 208  --  230  --   --   --    Basic Metabolic Panel: Recent Labs  Lab 05/23/18 1732 05/25/18 0343 05/26/18 0302 05/26/18 0913 05/26/18 0915 05/26/18 0918 05/27/18 0250  NA 141 141 139 142 141 141 137  K 3.9 3.2* 3.9 3.7 3.7 3.7 4.1  CL 108 102 99  --   --   --  99  CO2 --   --   --  25  GLUCOSE 107* 96 104*  --   --   --  92  BUN --   --   --  12  CREATININE 0.86 0.99 1.27*  1.21  --   --   --  1.12  CALCIUM 8.8* 9.2 8.8*  --   --   --  8.6*  MG  --   --  2.1  --   --   --   --    Liver Function Tests: Recent Labs  Lab 05/23/18 1732  AST 16  ALT 17  ALKPHOS 58  BILITOT 1.7*  PROT 7.0  ALBUMIN 3.8   BNP (last 3 results) Recent Labs    11/29/17 1603 05/23/18 1536  BNP 105* 310.3*  Cardiac Enzymes: Recent Labs  Lab 05/23/18 1536 05/24/18 1000 05/24/18 1608 05/24/18 2214  TROPONINI 0.04* 0.03*  0.03* 0.03*   CBG: No results for input(s): GLUCAP in the last 168 hours. Hgb A1c Recent Labs    05/26/18 0302  HGBA1C 5.3   Lipid Profile Recent Labs    05/25/18 0343  CHOL 152  HDL 36*  LDLCALC 102*  TRIG 69  CHOLHDL 4.2   Urinalysis    Component Value Date/Time   COLORURINE YELLOW 05/23/2018 1709   APPEARANCEUR CLEAR 05/23/2018 1709   LABSPEC 1.020 05/23/2018 1709   PHURINE 6.0 05/23/2018 1709   GLUCOSEU NEGATIVE 05/23/2018 1709   HGBUR NEGATIVE 05/23/2018 1709   BILIRUBINUR NEGATIVE 05/23/2018 1709   BILIRUBINUR Negative 12/18/2017 0955   KETONESUR NEGATIVE 05/23/2018 1709   PROTEINUR NEGATIVE 05/23/2018 1709   UROBILINOGEN negative (A) 12/18/2017 0955   NITRITE NEGATIVE 05/23/2018 1709   LEUKOCYTESUR NEGATIVE 05/23/2018 1709   Patient declined MDs offer to speak to his spouse and update his care.   Time coordinating discharge: 40 minutes  SIGNED:  Marcellus Scott, MD, FACP, [facility]. [facility]  To contact the attending provider between 7A-7P or the covering provider during after hours 7P-7A, please log into the web site [email or web address] and access using universal  password for that web site. If you do not have the password, please call the hospital operator.

## 2018-05-27 NOTE — Progress Notes (Signed)
Progress Note  Patient Name: Drew [name] Date of Encounter: 05/27/2018  Primary Cardiologist: Rollene Rotunda, MD  Subjective   Feels well.  Inpatient Medications    Scheduled Meds: . carvedilol  6.25 mg Oral BID WC  . enoxaparin (LOVENOX) injection  40 mg Subcutaneous Q24H  . pantoprazole  40 mg Oral Daily  . rosuvastatin  10 mg Oral Daily  . sodium chloride flush  3 mL Intravenous Q12H  . sodium chloride flush  3 mL Intravenous Q12H  . sodium chloride flush  3 mL Intravenous Q12H   Continuous Infusions: . sodium chloride     PRN Meds: sodium chloride, acetaminophen **OR** acetaminophen, albuterol, ondansetron (ZOFRAN) IV, oxyCODONE, senna-docusate, sodium chloride flush   Vital Signs    Vitals:   05/27/18 0500 05/27/18 0605 05/27/18 0723 05/27/18 0745  BP:  (!) 141/87 (!) 112/93   Pulse:   75 69  Resp:   (!) 22 17  Temp:   97.9 F (36.6 C)   TempSrc:   Oral   SpO2:   100% 92%  Weight: (!) 138.8 kg     Height:        Intake/Output Summary (Last 24 hours) at 05/27/2018 0911 Last data filed at 05/27/2018 0753 Gross per 24 hour  Intake 1432.77 ml  Output 610 ml  Net 822.77 ml   Last 3 Weights 05/27/2018 05/26/2018 05/25/2018  Weight (lbs) 306 lb 301 lb 5.9 oz 299 lb 13.2 oz  Weight (kg) 138.8 kg 136.7 kg 136 kg      Telemetry     NSR - Personally Reviewed  ECG    None recent  Physical Exam   GEN: No acute distress.   Neck: No JVD Cardiac: RRR, no murmurs, rubs, or gallops.  Respiratory: Clear to auscultation bilaterally. GI: Soft, nontender, non-distended  MS: No edema; No deformity. Neuro:  Nonfocal  Psych: Normal affect   Labs    Chemistry Recent Labs  Lab 05/23/18 1732 05/25/18 0343 05/26/18 0302  05/26/18 0915 05/26/18 0918 05/27/18 0250  NA 141 141 139   < > 141 141 137  K 3.9 3.2* 3.9   < > 3.7 3.7 4.1  CL 108 102 99  --   --   --  99  CO2 27 28 29   --   --   --  25  GLUCOSE 107* 96 104*  --   --   --  92  BUN 11 7 12   --    --   --  12  CREATININE 0.86 0.99 1.27*  1.21  --   --   --  1.12  CALCIUM 8.8* 9.2 8.8*  --   --   --  8.6*  PROT 7.0  --   --   --   --   --   --   ALBUMIN 3.8  --   --   --   --   --   --   AST 16  --   --   --   --   --   --   ALT 17  --   --   --   --   --   --   ALKPHOS 58  --   --   --   --   --   --   BILITOT 1.7*  --   --   --   --   --   --   GFRNONAA >60 >60 >60  >60  --   --   --  >  60  GFRAA >60 >60 >60  >60  --   --   --  >60  ANIONGAP --   --   --  13   < > = values in this interval not displayed.     Hematology Recent Labs  Lab 05/23/18 1536  05/26/18 0302 05/26/18 0913 05/26/18 0915 05/26/18 0918  WBC 7.0  --  5.6  --   --   --   RBC 4.67  --  4.98  --   --   --   HGB 14.5   < > 15.5 16.0 16.3 16.0  HCT 45.0   < > 47.2 47.0 48.0 47.0  MCV 96.4  --  94.8  --   --   --   MCH 31.0  --  31.1  --   --   --   MCHC 32.2  --  32.8  --   --   --   RDW 15.8*  --  14.6  --   --   --   PLT 208  --  230  --   --   --    < > = values in this interval not displayed.    Cardiac Enzymes Recent Labs  Lab 05/23/18 1536 05/24/18 1000 05/24/18 1608 05/24/18 2214  TROPONINI 0.04* 0.03* 0.03* 0.03*   No results for input(s): TROPIPOC in the last 168 hours.   BNP Recent Labs  Lab 05/23/18 1536  BNP 310.3*     DDimer No results for input(s): DDIMER in the last 168 hours.   Radiology    No results found.  Cardiac Studies   TTE: 05/24/18  IMPRESSIONS    1. The left ventricle has severely reduced systolic function, with an ejection fraction of 20-25%. The cavity size was severely dilated. There is moderate concentric left ventricular hypertrophy. Left ventricular diastolic Doppler parameters are  consistent with pseudonormalization. Elevated left atrial and left ventricular end-diastolic pressures Left ventricular diffuse hypokinesis.  2. The right ventricle has moderately reduced systolic function. The cavity was severely enlarged. There is no  increase in right ventricular wall thickness. Right ventricular systolic pressure is mildly elevated with an estimated pressure of 28.5 mmHg.  3. Left atrial size was moderately dilated.  4. Right atrial size was moderately dilated.  5. Mild thickening of the mitral valve leaflet.  6. The aortic valve is tricuspid. Moderate thickening of the aortic valve. Aortic valve regurgitation was not assessed by color flow Doppler.  7. The inferior vena cava was dilated in size with <50% respiratory variability.  Cath: 05/26/18   LV end diastolic pressure is moderately elevated.  There is severe left ventricular systolic dysfunction.  Hemodynamic findings consistent with mild pulmonary hypertension.    Right dominant coronary anatomy with widely patent left main, LAD, circumflex, and RCA.  Acute on chronic systolic heart failure with mildly to moderately elevated filling pressures mean wedge 20 mmHg and mean LVEDP 24 mmHg.  Echo documented LVEF less than 30%.  Mild to moderate pulmonary hypertension, cardiac output by Fick 7.7 L/min.  Right atrial mean 6 mmHg.  No evidence of right heart failure.  RECOMMENDATIONS:   Guideline directed therapy for systolic heart failure: ARNI/MRA/BETA-blocker therapy.  Also consider SGLT2, dapagliflozin, not yet a guideline recommendation.  Weight loss.  Screen for sleep apnea.  Patient Profile     58 y.o. male with PMH of HL who presented with mildly elevated troponin and new systolic HF.   Assessment &  Plan    1. NICM: Cath yesterday noted above. Plan for medical therapy for HF. Tolerating BB. Cr stable. Consider adding Entresto though BP has been soft. Not sure that he could tolerate yet. Suspect he will need further diuresis.   2. HL: on statin  3. Elevated troponin: minimal flat trend. Demand ischemia   For questions or updates, please contact [facility] Please consult [email or web address] for contact info under        Signed, Laverda Page, NP  05/27/2018, 9:11 AM    I have examined the patient and reviewed assessment and plan and discussed with patient.  Agree with above as stated.  Plan for starting low dose Entresto.  May need case manager to help get this medicine.  COntinue beta blocker.  WOuld send hone on Lasix 40 mg BID>  If he has sudden drop in weight, or dizziness, could cut back to once a day a Lasix.  He will need f/u phone visit on Friday and BMet early next week.   OK to discharge from a cardiac standpoint.  Cr stable.   Will arrange f/u visit for Friday with Dr. Antoine Poche or APP, and labs early next week.  Lance Muss

## 2018-05-30 ENCOUNTER — Telehealth: Payer: Self-pay | Admitting: Medical

## 2018-05-30 ENCOUNTER — Telehealth: Payer: Self-pay | Admitting: *Deleted

## 2018-05-30 NOTE — Telephone Encounter (Signed)
Drew [name]. Looks like pt has appointmennt with cardiologist on 18th. Since he will see cardiologist appointment with me can be virtual. Can be on 20t h or 21 st. When you talk with pt stress he needs to keep virtual visit with cardiologist.

## 2018-06-01 NOTE — Progress Notes (Signed)
Virtual Visit via Video Note   This visit type was conducted due to national recommendations for restrictions regarding the COVID-19 Pandemic (e.g. social distancing) in an effort to limit this patient's exposure and mitigate transmission in our community.  Due to his co-morbid illnesses, this patient is at least at moderate risk for complications without adequate follow up.  This format is felt to be most appropriate for this patient at this time.  All issues noted in this document were discussed and addressed.  A limited physical exam was performed with this format.  Please refer to the patient's chart for his consent to telehealth for [facility].   Date:  06/02/2018   ID:  Drew [name], DOB [birth year], MRN [id number]  Patient Location: Home Provider Location: Home  PCP:  Esperanza Richters, PA-C  Cardiologist:  Rollene Rotunda, MD  Electrophysiologist:  None   Evaluation Performed:  Follow-Up Visit  Chief Complaint:  Cardiomyopathy   History of Present Illness:    Drew [name] is a 58 y.o. male with who I saw recently in the hospital with acute systolic HF.  This was a new diagnosis.  He had a cardiac cath with normal coronaries.  EF was 30% on cath and on echo 20 - 25%.   Since going home he great so much better.  He is not having any shortness of breath, PND or orthopnea.  He is not having any palpitations, presyncope or syncope.  He is weighing himself though his weight is gone up a little bit.  He does not have a blood pressure cuff yet.  Interestingly he was sent home with oxygen because he said that they said his oxygen saturations were in the low 80s but he feels fine and has not been using it.  He is not describing PND or orthopnea.  He is not describing swelling.  He has no chest pressure, neck or arm discomfort.  He is not having palpitations, presyncope or syncope.  He is trying hard to watch his salt and does limit his water.  The patient does not have symptoms  concerning for COVID-19 infection (fever, chills, cough, or new shortness of breath).    Past Medical History:  Diagnosis Date  . Allergy   . Dilated cardiomyopathy (HCC)   . HTN (hypertension)   . Hyperlipidemia 05/24/2018   Past Surgical History:  Procedure Laterality Date  . KNEE ARTHROSCOPY  2009  . RIGHT/LEFT HEART CATH AND CORONARY ANGIOGRAPHY N/A 05/26/2018   Procedure: RIGHT/LEFT HEART CATH AND CORONARY ANGIOGRAPHY;  Surgeon: Lyn Records, MD;  Location: [facility];  Service: Cardiovascular;  Laterality: N/A;  . THROAT SURGERY  2005     Prior to Admission medications   Medication Sig Start Date End Date Taking? Authorizing Provider  albuterol (PROVENTIL HFA;VENTOLIN HFA) 108 (90 Base) MCG/ACT inhaler Inhale 2 puffs into the lungs every 6 (six) hours as needed for wheezing or shortness of breath. 11/29/17  Yes Saguier, Ramon Dredge, PA-C  carvedilol (COREG) 6.25 MG tablet Take 1 tablet (6.25 mg total) by mouth 2 (two) times daily with a meal. 05/27/18  Yes Hongalgi, Maximino Greenland, MD  furosemide (LASIX) 40 MG tablet Take 1 tablet (40 mg total) by mouth 2 (two) times daily. If sudden drop in weight or dizziness, cut back to once a day Lasix. 05/27/18  Yes Hongalgi, Maximino Greenland, MD  pantoprazole (PROTONIX) 40 MG tablet Take 1 tablet (40 mg total) by mouth daily. 05/16/18  Yes Sharlene Dory, DO  rosuvastatin (CRESTOR) 10 MG tablet Take 1 tablet (10 mg total) by mouth daily. 05/27/18  Yes Hongalgi, Maximino Greenland, MD  sacubitril-valsartan (ENTRESTO) 24-26 MG Take 1 tablet by mouth 2 (two) times daily. 05/27/18  Yes Hongalgi, Maximino Greenland, MD     Allergies:   Tetracyclines & related   Social History   Tobacco Use  . Smoking status: Current Every Day Smoker    Packs/day: 1.00    Years: 20.00    Pack years: 20.00    Types: Cigarettes  . Smokeless tobacco: Never Used  Substance Use Topics  . Alcohol use: Yes    Alcohol/week: 0.0 standard drinks    Comment: 6 pack a week spread out over  weekend  . Drug use: No     Family Hx: The patient's family history includes Cancer in his father; Hearing loss in his mother; Heart failure in his paternal grandfather and another family member.  ROS:   Please see the history of present illness.    As stated in the HPI and negative for all other systems.   Prior CV studies:   The following studies were reviewed today:    Labs/Other Tests and Data Reviewed:    EKG:  No ECG reviewed.  Recent Labs: 05/23/2018: ALT 17; B Natriuretic Peptide 310.3 05/24/2018: TSH 2.453 05/26/2018: Hemoglobin 16.0; Magnesium 2.1; Platelets 230 05/27/2018: BUN 12; Creatinine, Ser 1.12; Potassium 4.1; Sodium 137   Recent Lipid Panel Lab Results  Component Value Date/Time   CHOL 152 05/25/2018 03:43 AM   TRIG 69 05/25/2018 03:43 AM   HDL 36 (L) 05/25/2018 03:43 AM   CHOLHDL 4.2 05/25/2018 03:43 AM   LDLCALC 102 (H) 05/25/2018 03:43 AM    Wt Readings from Last 3 Encounters:  06/02/18 (!) 312 lb (141.5 kg)  05/27/18 (!) 306 lb (138.8 kg)  12/17/17 (!) 331 lb 6.4 oz (150.3 kg)     Objective:    Vital Signs:  Ht  (1.803 m)   Wt (!) 312 lb (141.5 kg)   BMI 43.52 kg/m    VITAL SIGNS:  reviewed GEN:  no acute distress RESPIRATORY:  normal respiratory effort, symmetric expansion NEURO:  alert and oriented x 3, no obvious focal deficit PSYCH:  normal affect  ASSESSMENT & PLAN:    ACUTE SYSTOLIC HF:   He is going to buy blood pressure cuff and pulse oximeter.  Today I am going to try to increase his carvedilol 9.375 mg twice daily.  I will see him back in the office in 1 month and check electrolytes as well as try to uptitrate his Entresto.  He is avoiding salt and limiting his fluid.  Is not drinking alcohol.  HTN:  This is being managed in the context of treating his CHF  TOBACCO ABUSE:    He is not smoking cigarettes.  HYPERLIPIDEMIA:    Continue current therapy.   ETOH:  He is not drinking alcohol  COVID-19 Education: The signs  and symptoms of COVID-19 were discussed with the patient and how to seek care for testing (follow up with PCP or arrange E-visit).  The importance of social distancing was discussed today.  Time:   Today, I have spent 25 minutes with the patient with telehealth technology discussing the above problems.     Medication Adjustments/Labs and Tests Ordered: Current medicines are reviewed at length with the patient today.  Concerns regarding medicines are outlined above.   Tests Ordered: No orders of the defined types were placed in  this encounter.   Medication Changes: No orders of the defined types were placed in this encounter.   Disposition:  Follow up with me in the office in one month  Signed, Rollene Rotunda, MD  06/02/2018 10:36 AM    [facility]

## 2018-06-02 ENCOUNTER — Encounter: Payer: Self-pay | Admitting: Cardiology

## 2018-06-02 ENCOUNTER — Ambulatory Visit (INDEPENDENT_AMBULATORY_CARE_PROVIDER_SITE_OTHER): Payer: Federal, State, Local not specified - PPO | Admitting: Medical

## 2018-06-02 ENCOUNTER — Telehealth (INDEPENDENT_AMBULATORY_CARE_PROVIDER_SITE_OTHER): Payer: Federal, State, Local not specified - PPO | Admitting: Cardiology

## 2018-06-02 ENCOUNTER — Other Ambulatory Visit: Payer: Self-pay

## 2018-06-02 VITALS — Ht 71.0 in | Wt 312.0 lb

## 2018-06-02 DIAGNOSIS — Z720 Tobacco use: Secondary | ICD-10-CM

## 2018-06-02 DIAGNOSIS — Z87891 Personal history of nicotine dependence: Secondary | ICD-10-CM

## 2018-06-02 DIAGNOSIS — R0600 Dyspnea, unspecified: Secondary | ICD-10-CM

## 2018-06-02 DIAGNOSIS — I10 Essential (primary) hypertension: Secondary | ICD-10-CM | POA: Diagnosis not present

## 2018-06-02 DIAGNOSIS — I509 Heart failure, unspecified: Secondary | ICD-10-CM

## 2018-06-02 DIAGNOSIS — I5021 Acute systolic (congestive) heart failure: Secondary | ICD-10-CM

## 2018-06-02 DIAGNOSIS — Z7189 Other specified counseling: Secondary | ICD-10-CM

## 2018-06-02 DIAGNOSIS — I517 Cardiomegaly: Secondary | ICD-10-CM | POA: Diagnosis not present

## 2018-06-02 HISTORY — DX: Acute systolic (congestive) heart failure: I50.21

## 2018-06-02 MED ORDER — CARVEDILOL 6.25 MG PO TABS: 9.3750 mg | ORAL_TABLET | Freq: Two times a day (BID) | ORAL | 3 refills | Status: DC

## 2018-06-02 MED ORDER — SACUBITRIL-VALSARTAN 24-26 MG PO TABS: 1.0000 | ORAL_TABLET | Freq: Two times a day (BID) | ORAL | 3 refills | Status: DC

## 2018-06-02 NOTE — Patient Instructions (Addendum)
For history of hypertension and recent CHF, continue with current medications prescribed by cardiologist today.  Recent dyspnea before hospitalization was from CHF.  You have had history of some intermittent dyspnea in the past and did respond to Symbicort as well as you do have history of smoking.  Concern for possible COPD and will go ahead and make referral to pulmonologist to get their opinion.  Also with your body habitus want to get opinion if you might have sleep apnea as well.  A good job on your smoking cessation.  Continue with Crestor for your hyperlipidemia.  After for review of your discharge summary and reviewing cardiologist notes I did not see that CBC and CMP was done.  Will order that today as future lab and ask staff to call you to get the blood work done by end of this week.  Follow-up with me late August or early September.  At that time we will plan to get repeat metabolic panel as well as lipid panel.

## 2018-06-02 NOTE — Progress Notes (Signed)
Subjective:    Patient ID: Drew [name], male    DOB: 1960/09/10, 58 y.o.   MRN: [id number]  HPI   Virtual Visit via Video Note  I connected with Drew [name] on 06/02/18 at  2:20 PM EDT by a video enabled telemedicine application and verified that I am speaking with the correct person using two identifiers.  Location: Patient: car Provider: office.   I discussed the limitations of evaluation and management by telemedicine and the availability of in person appointments. The patient expressed understanding and agreed to proceed.  History of Present Illness:  Pt states he feels very good/great. He states doing great now after he was discharged from the hospital. Pt was admitted for chf. No feet of calf swelling. No popliteal pain. No dyspnea on exertion.  I reviewed pt dc summary and his cardiologist note.  He states given 02 by hospital but he has not had to use. Below is DC summary and review cardiologist notes today.  He has cardiologist appointment in one month.    A/P Brief Summary: 58 year old married male, lives with spouse and family, independent, Radiographer, therapeutic employee, PMH of HTN, HLD, tobacco abuse, alcohol use, obesity, [facility] due to 3 weeks history of progressively worsening dyspnea, orthopnea, PND and leg edema. Admitted for new onset acute CHF. TTE reveals dilated cardiomyopathy with low EF. Cardiology consulted. Improved after IV diuresis. Status post cardiac cath 5/11, normal coronaries.  A?  1. Acute systolic and diastolic CHF/nonischemic cardiomyopathy:Etiology/DD: Longstanding poorly controlled HTN, alcohol abuse. TTE 5/9: LVEF 20-25% with diffuse hypokinesis and grade 2 diastolic dysfunction. Treated with IV Lasix 60 mg every 12 hours. - 8L since admission. Down by 26pounds since admission. Volume status much improved. Cardiology consultation appreciated. Started carvedilol 6.25 mg twice daily.Urine appears  concentrated and output decreased per patient. Also creatinine has increased from 0.99-1.27. Discontinued IV Lasix 5/11. Cardiac cath 5/11 shows normal coronary arteries and findings consistent with acute on chronic systolic CHF.  Volume status significantly improved.  Cardiology has seen him and cleared for discharge on Lasix 40 mg twice daily, may cut back to once a day if he has sudden drop in weight or dizziness, carvedilol 6.25 mg twice daily, Entresto 24-26, 1 tablet twice daily and Crestor 10 mg daily.  Cardiology has arranged close outpatient follow-up with repeat labs.  I discussed with cardiology who recommend that patient needs to be out of work for at least 1 to 2 weeks until he recuperates from this acute illness. 2. Hypokalemia:Secondary to aggressive IV diuresis. Replaced. Magnesium normal. 3. Essential hypertension: Continue carvedilol and Entresto.  Monitor closely. 4. Acute respiratory failure with hypoxia:Secondary to decompensated CHF complicating possible underlying OSA.  Patient desaturated to 85% on room air with activity and qualified for home oxygen at discharge.  Case management consulted. 5. Hyperlipidemia: LDL 101.  Continue Crestor and may need to adjust dose as outpatient. 6. Morbid obesity/Body mass index is 42.03 kg/m.Needs to diet, exercise and weight loss which was discussed in detail with patient and he verbalized understanding. 7. Suspected YWV:[id number] evaluation with sleep study. 8. Tobacco abuse:Cessation counseled. Declines nicotine patch.  Patient never filled prescription for Wellbutrin which was given to him at the end of last year. 9. Alcohol use/? Abuse: No overt withdrawal.  Abstinence counseled. 10. Acute kidney injury: Secondary to aggressive IV diuresis. Diuretics hold. Also received IV contrast during cardiac cath.   Resolved.  Follow BMP closely as outpatient due to diuretics and Entresto.  Objective- General- no acute  distress. Pleasant. Oriented. Lungs- unlabored on inspection. Neuro- gross motor function intact.      Assessment and plan- For history of hypertension and recent CHF, continue with current medications prescribed by cardiologist today.  Recent dyspnea before hospitalization was from CHF.  You have had history of some intermittent dyspnea in the past and did respond to Symbicort as well as you do have history of smoking.  Concern for possible COPD and will go ahead and make referral to pulmonologist to get their opinion.  Also with your body habitus want to get opinion if you might have sleep apnea as well.  A good job on your smoking cessation.  Continue with Crestor for your hyperlipidemia.  After for review of your discharge summary and reviewing cardiologist notes I did not see that CBC and CMP was done.  Will order that today as future lab and ask staff to call you to get the blood work done by end of this week.  Follow-up with me late August or early September.  At that time we will plan to get repeat metabolic panel as well as lipid panel.   Follow Up Instructions:    I discussed the assessment and treatment plan with the patient. The patient was provided an opportunity to ask questions and all were answered. The patient agreed with the plan and demonstrated an understanding of the instructions.   The patient was advised to call back or seek an in-person evaluation if the symptoms worsen or if the condition fails to improve as anticipated.     Drew Richters, PA-C  Review of Systems  Constitutional: Negative for activity change, chills, diaphoresis, fatigue and fever.  Respiratory: Negative for cough, chest tightness, shortness of breath and wheezing.   Cardiovascular: Negative for chest pain, palpitations and leg swelling.  Gastrointestinal: Negative for abdominal pain, nausea and vomiting.  Musculoskeletal: Negative for neck pain and neck stiffness.  Skin: Negative for  rash.  Neurological: Negative for dizziness, seizures, weakness and headaches.  Psychiatric/Behavioral: Negative for agitation, behavioral problems and confusion. The patient is not nervous/anxious.        Objective:   Physical Exam        Assessment & Plan:

## 2018-06-02 NOTE — Patient Instructions (Addendum)
Medication Instructions:  INCREASE- Carvedilol 9.375 mg (1 1/2 tablets) twice a day  If you need a refill on your cardiac medications before your next appointment, please call your pharmacy.  Labwork: None  Ordered   Testing/Procedures: None Ordered  Follow-Up: . Your physician recommends that you schedule a follow-up appointment in: 1 Month   At [facility], you and your health needs are our priority.  As part of our continuing mission to provide you with exceptional heart care, we have created designated Provider Care Teams.  These Care Teams include your primary Cardiologist (physician) and Advanced Practice Providers (APPs -  Physician Assistants and Nurse Practitioners) who all work together to provide you with the care you need, when you need it.  Thank you for choosing [facility] at [facility]!!

## 2018-06-04 ENCOUNTER — Telehealth: Payer: Self-pay

## 2018-06-04 NOTE — Telephone Encounter (Signed)
Completed the patients prior authorization for [facility] and it was approved. The key is [id number]. Called patient and informed him of the prior authorization approval for his medication. Patient verbalized understanding.

## 2018-06-05 ENCOUNTER — Other Ambulatory Visit: Payer: Self-pay

## 2018-06-05 ENCOUNTER — Other Ambulatory Visit (INDEPENDENT_AMBULATORY_CARE_PROVIDER_SITE_OTHER): Payer: Federal, State, Local not specified - PPO

## 2018-06-05 DIAGNOSIS — I517 Cardiomegaly: Secondary | ICD-10-CM

## 2018-06-05 DIAGNOSIS — I509 Heart failure, unspecified: Secondary | ICD-10-CM | POA: Diagnosis not present

## 2018-06-05 DIAGNOSIS — R0600 Dyspnea, unspecified: Secondary | ICD-10-CM

## 2018-06-05 DIAGNOSIS — I10 Essential (primary) hypertension: Secondary | ICD-10-CM

## 2018-06-05 LAB — CBC WITH DIFFERENTIAL/PLATELET
Basophils Absolute: 0 10*3/uL (ref 0.0–0.1)
Basophils Relative: 1 % (ref 0.0–3.0)
Eosinophils Absolute: 0.2 10*3/uL (ref 0.0–0.7)
Eosinophils Relative: 5.5 % — ABNORMAL HIGH (ref 0.0–5.0)
HCT: 44.3 % (ref 39.0–52.0)
Hemoglobin: 14.6 g/dL (ref 13.0–17.0)
Lymphocytes Relative: 38.1 % (ref 12.0–46.0)
Lymphs Abs: 1.7 10*3/uL (ref 0.7–4.0)
MCHC: 33 g/dL (ref 30.0–36.0)
MCV: 96.3 fl (ref 78.0–100.0)
Monocytes Absolute: 0.4 10*3/uL (ref 0.1–1.0)
Monocytes Relative: 9 % (ref 3.0–12.0)
Neutro Abs: 2 10*3/uL (ref 1.4–7.7)
Neutrophils Relative %: 46.4 % (ref 43.0–77.0)
Platelets: 179 10*3/uL (ref 150.0–400.0)
RBC: 4.6 Mil/uL (ref 4.22–5.81)
RDW: 14.6 % (ref 11.5–15.5)
WBC: 4.4 10*3/uL (ref 4.0–10.5)

## 2018-06-05 LAB — COMPREHENSIVE METABOLIC PANEL
ALT: 31 U/L (ref 0–53)
AST: 21 U/L (ref 0–37)
Albumin: 3.9 g/dL (ref 3.5–5.2)
Alkaline Phosphatase: 50 U/L (ref 39–117)
BUN: 14 mg/dL (ref 6–23)
CO2: 31 mEq/L (ref 19–32)
Calcium: 8.7 mg/dL (ref 8.4–10.5)
Chloride: 102 mEq/L (ref 96–112)
Creatinine, Ser: 1.08 mg/dL (ref 0.40–1.50)
GFR: 85.67 mL/min (ref >60.00)
Glucose, Bld: 122 mg/dL — ABNORMAL HIGH (ref 70–99)
Potassium: 4 mEq/L (ref 3.5–5.1)
Sodium: 140 mEq/L (ref 135–145)
Total Bilirubin: 0.5 mg/dL (ref 0.2–1.2)
Total Protein: 6.7 g/dL (ref 6.0–8.3)

## 2018-06-05 MED ORDER — SACUBITRIL-VALSARTAN 24-26 MG PO TABS: 1.0000 | ORAL_TABLET | Freq: Two times a day (BID) | ORAL | 3 refills | Status: DC

## 2018-06-27 ENCOUNTER — Other Ambulatory Visit: Payer: Self-pay | Admitting: Medical

## 2018-06-27 NOTE — Telephone Encounter (Signed)
Copied from [place] [id number]. Topic: Quick Communication - Rx Refill/Question >> Jun 27, 2018 11:01 AM Selinda Flavin B, NT wrote: Medication: furosemide (LASIX) 40 MG tablet   Has the patient contacted their pharmacy? yes (Agent: If no, request that the patient contact the pharmacy for the refill.) (Agent: If yes, when and what did the pharmacy advise?)  Preferred Pharmacy (with phone number or street name): [facility] [place], [place]: Please be advised that RX refills may take up to 3 business days. We ask that you follow-up with your pharmacy.

## 2018-06-29 MED ORDER — FUROSEMIDE 40 MG PO TABS: 40.0000 mg | ORAL_TABLET | Freq: Two times a day (BID) | ORAL | 1 refills | Status: DC

## 2018-06-29 NOTE — Telephone Encounter (Signed)
I tried to send prescription of lasix varius times to pt phramacy. It instead printed 3 times. Will you make sure to fax in the presciption I printed that states cut back to one tablet if gets weakness or light headed.

## 2018-07-10 ENCOUNTER — Other Ambulatory Visit: Payer: Self-pay | Admitting: Family Medicine

## 2018-07-10 ENCOUNTER — Other Ambulatory Visit: Payer: Self-pay | Admitting: Medical

## 2018-07-10 DIAGNOSIS — R142 Eructation: Secondary | ICD-10-CM

## 2018-07-10 DIAGNOSIS — R140 Abdominal distension (gaseous): Secondary | ICD-10-CM

## 2018-07-30 DIAGNOSIS — I5041 Acute combined systolic (congestive) and diastolic (congestive) heart failure: Secondary | ICD-10-CM | POA: Diagnosis not present

## 2018-08-09 ENCOUNTER — Other Ambulatory Visit: Payer: Self-pay | Admitting: Medical

## 2018-08-30 DIAGNOSIS — I5041 Acute combined systolic (congestive) and diastolic (congestive) heart failure: Secondary | ICD-10-CM | POA: Diagnosis not present

## 2018-08-31 ENCOUNTER — Other Ambulatory Visit: Payer: Self-pay | Admitting: Medical

## 2018-09-03 ENCOUNTER — Other Ambulatory Visit: Payer: Self-pay

## 2018-09-04 ENCOUNTER — Ambulatory Visit: Payer: Federal, State, Local not specified - PPO | Admitting: Medical

## 2018-09-04 ENCOUNTER — Encounter: Payer: Self-pay | Admitting: Medical

## 2018-09-04 ENCOUNTER — Ambulatory Visit (HOSPITAL_BASED_OUTPATIENT_CLINIC_OR_DEPARTMENT_OTHER)
Admission: RE | Admit: 2018-09-04 | Discharge: 2018-09-04 | Disposition: A | Discharge status: Home / Self Care | Payer: Federal, State, Local not specified - PPO | Source: Ambulatory Visit | Attending: Medical | Admitting: Medical

## 2018-09-04 VITALS — BP 129/66 | HR 58 | Temp 96.8°F | Resp 16 | Ht 71.0 in | Wt 347.0 lb

## 2018-09-04 DIAGNOSIS — R600 Localized edema: Secondary | ICD-10-CM | POA: Diagnosis not present

## 2018-09-04 DIAGNOSIS — I509 Heart failure, unspecified: Secondary | ICD-10-CM | POA: Insufficient documentation

## 2018-09-04 DIAGNOSIS — I517 Cardiomegaly: Secondary | ICD-10-CM

## 2018-09-04 DIAGNOSIS — I10 Essential (primary) hypertension: Secondary | ICD-10-CM

## 2018-09-04 DIAGNOSIS — R0600 Dyspnea, unspecified: Secondary | ICD-10-CM | POA: Diagnosis not present

## 2018-09-04 LAB — COMPREHENSIVE METABOLIC PANEL
ALT: 12 U/L (ref 0–53)
AST: 14 U/L (ref 0–37)
Albumin: 4.6 g/dL (ref 3.5–5.2)
Alkaline Phosphatase: 50 U/L (ref 39–117)
BUN: 18 mg/dL (ref 6–23)
CO2: 25 mEq/L (ref 19–32)
Calcium: 9 mg/dL (ref 8.4–10.5)
Chloride: 106 mEq/L (ref 96–112)
Creatinine, Ser: 1.05 mg/dL (ref 0.40–1.50)
GFR: 88.42 mL/min (ref >60.00)
Glucose, Bld: 108 mg/dL — ABNORMAL HIGH (ref 70–99)
Potassium: 3.9 mEq/L (ref 3.5–5.1)
Sodium: 140 mEq/L (ref 135–145)
Total Bilirubin: 0.8 mg/dL (ref 0.2–1.2)
Total Protein: 7.1 g/dL (ref 6.0–8.3)

## 2018-09-04 LAB — BRAIN NATRIURETIC PEPTIDE: Pro B Natriuretic peptide (BNP): 41 pg/mL (ref 0.0–100.0)

## 2018-09-04 MED ORDER — CARVEDILOL 12.5 MG PO TABS: 12.5000 mg | ORAL_TABLET | Freq: Two times a day (BID) | ORAL | 3 refills | Status: DC

## 2018-09-04 NOTE — Progress Notes (Signed)
Subjective:    Patient ID: Drew [name], male    DOB: 1960/12/31, 58 y.o.   MRN: [id number]  HPI   Pt in for follow up.  Pt state since last visit. He had chf admission. This was May 2020. Currently no sob or wheezing. He does have some pedal edema presently. Pt has quit smoking as well. He states quit around time of hospitalization.   Pt is now on entresto, lasix and coreg.  Pt states he has oxygen at home but does not have to use.   Pt states since discharge from hospital he is no longer having any fatigue.  Pt was told to take coreg 1.5 mg twice a day. Impossible to break tab due to shape. It crumbles. So he took 2 tab po bid. His pulse is usually 60-65.  His bp highest 176 systolic. Most of time 160-737 systolic.   Review of Systems  Constitutional: Negative for chills, fatigue and fever.  Respiratory: Negative for cough, chest tightness, shortness of breath and wheezing.   Cardiovascular: Negative for chest pain and palpitations.  Gastrointestinal: Negative for abdominal pain, diarrhea, nausea and rectal pain.  Musculoskeletal: Negative for back pain.  Skin: Negative for rash.  Neurological: Negative for dizziness, weakness, numbness and headaches.  Hematological: Negative for adenopathy. Does not bruise/bleed easily.  Psychiatric/Behavioral: Negative for behavioral problems, confusion and decreased concentration. The patient is not nervous/anxious.     Past Medical History:  Diagnosis Date  . Allergy   . Dilated cardiomyopathy ([place])   . HTN (hypertension)   . Hyperlipidemia 05/24/2018     Social History   Socioeconomic History  . Marital status: Married    Spouse name: Not on file  . Number of children: Not on file  . Years of education: Not on file  . Highest education level: Not on file  Occupational History  . Not on file  Social Needs  . Financial resource strain: Not hard at all  . Food insecurity    Worry: Never true    Inability: Never true  .  Transportation needs    Medical: No    Non-medical: No  Tobacco Use  . Smoking status: Current Every Day Smoker    Packs/day: 1.00    Years: 20.00    Pack years: 20.00    Types: Cigarettes  . Smokeless tobacco: Never Used  Substance and Sexual Activity  . Alcohol use: Yes    Alcohol/week: 0.0 standard drinks    Comment: 6 pack a week spread out over weekend  . Drug use: No  . Sexual activity: Yes  Lifestyle  . Physical activity    Days per week: Patient refused    Minutes per session: Patient refused  . Stress: Not on file  Relationships  . Social Herbalist on phone: Patient refused    Gets together: Patient refused    Attends religious service: Patient refused    Active member of club or organization: Patient refused    Attends meetings of clubs or organizations: Patient refused    Relationship status: Patient refused  . Intimate partner violence    Fear of current or ex partner: Patient refused    Emotionally abused: Patient refused    Physically abused: Patient refused    Forced sexual activity: Patient refused  Other Topics Concern  . Not on file  Social History Narrative   [employer].   Married lives with wife, mother in law and daughter.  7 children.  Past Surgical History:  Procedure Laterality Date  . KNEE ARTHROSCOPY  2009  . RIGHT/LEFT HEART CATH AND CORONARY ANGIOGRAPHY N/A 05/26/2018   Procedure: RIGHT/LEFT HEART CATH AND CORONARY ANGIOGRAPHY;  Surgeon: Lyn RecordsSmith, Henry W, MD;  Location: [facility];  Service: Cardiovascular;  Laterality: N/A;  . THROAT SURGERY  2005    Family History  Problem Relation Age of Onset  . Hearing loss Mother   . Cancer Father   . Heart failure Other   . Heart failure Paternal Grandfather     Allergies  Allergen Reactions  . Tetracyclines & Related Hives and Itching    Current Outpatient Medications on File Prior to Visit  Medication Sig Dispense Refill  . albuterol (PROVENTIL HFA;VENTOLIN HFA) 108 (90  Base) MCG/ACT inhaler Inhale 2 puffs into the lungs every 6 (six) hours as needed for wheezing or shortness of breath. 1 Inhaler 2  . carvedilol (COREG) 6.25 MG tablet Take 1.5 tablets (9.375 mg total) by mouth 2 (two) times daily with a meal. 135 tablet 3  . furosemide (LASIX) 40 MG tablet TAKE 1 TABLET BY MOUTH TWICE DAILY. IF SUDDEN DROP IN WEIGHT OR DIZZINESS CUT BACK TO ONCE PER DAY 60 tablet 1  . pantoprazole (PROTONIX) 40 MG tablet TAKE 1 TABLET(40 MG) BY MOUTH DAILY 30 tablet 1  . rosuvastatin (CRESTOR) 10 MG tablet TAKE 1 TABLET(10 MG) BY MOUTH DAILY 30 tablet 0  . sacubitril-valsartan (ENTRESTO) 24-26 MG Take 1 tablet by mouth 2 (two) times daily. 90 tablet 3   No current facility-administered medications on file prior to visit.     BP 129/66   Pulse (!) 58   Temp (!) 96.8 F (36 C) (Oral)   Resp 16   Ht 5\' 11"  (1.803 m)   Wt (!) 347 lb (157.4 kg)   SpO2 100%   BMI 48.40 kg/m       Objective:   Physical Exam  General Mental Status- Alert. General Appearance- Not in acute distress.   Skin General: Color- Normal Color. Moisture- Normal Moisture.  Neck Carotid Arteries- Normal color. Moisture- Normal Moisture. No carotid bruits. No JVD.  Chest and Lung Exam Auscultation: Breath Sounds:-Normal.  Cardiovascular Auscultation:Rythm- Regular. Murmurs & Other Heart Sounds:Auscultation of the heart reveals- No Murmurs.  Abdomen Inspection:-Inspeection Normal. Palpation/Percussion:Note:No mass. Palpation and Percussion of the abdomen reveal- Non Tender, Non Distended + BS, no rebound or guarding.  Neurologic Cranial Nerve exam:- CN III-XII intact(No nystagmus), symmetric smile. Strength:- 5/5 equal and symmetric strength both upper and lower extremities.  Lower ext- 2+ pedal edema bilaterally and symmetric.      Assessment & Plan:  You clinically appear stable with your chf but do report bilateral pedal edema. Will get cmp, bnp, and cxr to assess.  Continue  your Entresto, Lasix and Coreg.  I do think is reasonable for you to go ahead and take Coreg 12.5 twice daily since current swelling of the 6.25 mg tablet is not possible per your description.  Also your pulse and blood pressure is reasonably controlled.  If you get consistent low pulses in the 50 range then will need to consider changing the dose again.  Your pedal edema could be from CHF flare versus a combination of dependent edema.  We will get x-ray and labs described above.  If everything looks good regarding imaging studies and labs then would recommend getting some compression hose stockings/compression socks.  Good job on quitting smoking.  Follow-up date to be determined after lab  review.  Reminded to get flu vaccine in about a month in light of current pandemic situation.  40 minutes spent with pt today. 50% of time spent counseling pt on plan going forward and differential dx of pedal edema.

## 2018-09-04 NOTE — Patient Instructions (Addendum)
You clinically appear stable with your chf but do report bilateral pedal edema. Will get cmp, bnp, and cxr to assess.  Continue your Entresto, Lasix and Coreg.  I do think is reasonable for you to go ahead and take Coreg 12.5 twice daily since current swelling of the 6.25 mg tablet is not possible per your description.  Also your pulse and blood pressure is reasonably controlled.  If you get consistent low pulses in the 50 range then will need to consider changing the dose again.  Your pedal edema could be from CHF flare versus a combination of dependent edema.  We will get x-ray and labs described above.  If everything looks good regarding imaging studies and labs then would recommend getting some compression hose stockings/compression socks.  Good job on quitting smoking.  Follow-up date to be determined after lab review.  Reminded to get flu vaccine in about a month in light of current pandemic situation.

## 2018-09-07 ENCOUNTER — Other Ambulatory Visit: Payer: Self-pay | Admitting: Medical

## 2018-09-30 DIAGNOSIS — I5041 Acute combined systolic (congestive) and diastolic (congestive) heart failure: Secondary | ICD-10-CM | POA: Diagnosis not present

## 2018-10-21 ENCOUNTER — Other Ambulatory Visit: Payer: Self-pay | Admitting: *Deleted

## 2018-10-21 MED ORDER — ROSUVASTATIN CALCIUM 10 MG PO TABS: ORAL_TABLET | ORAL | 1 refills | Status: DC

## 2018-10-30 DIAGNOSIS — I5041 Acute combined systolic (congestive) and diastolic (congestive) heart failure: Secondary | ICD-10-CM | POA: Diagnosis not present

## 2018-11-30 DIAGNOSIS — I5041 Acute combined systolic (congestive) and diastolic (congestive) heart failure: Secondary | ICD-10-CM | POA: Diagnosis not present

## 2018-12-08 NOTE — Telephone Encounter (Signed)
erroneous

## 2018-12-30 DIAGNOSIS — I5041 Acute combined systolic (congestive) and diastolic (congestive) heart failure: Secondary | ICD-10-CM | POA: Diagnosis not present

## 2019-01-27 ENCOUNTER — Ambulatory Visit: Payer: Self-pay | Admitting: Medical

## 2019-01-27 ENCOUNTER — Telehealth: Payer: Self-pay | Admitting: Medical

## 2019-01-27 MED ORDER — FUROSEMIDE 40 MG PO TABS: ORAL_TABLET | ORAL | 1 refills | Status: DC

## 2019-01-27 NOTE — Telephone Encounter (Signed)
Spoke with patient and he stated that he was needing lasix advised patient that it was in best interest due to him having sob, and edema in legs to go to ED or UC for eval. Patient was adamant that he needed his lasix. Reached out to pcp regarding issue. PCP agreed with plan advised patient PCP stated to go to ED or UC for eval due to cardiac hx and late in day needing vitals and o2 checked. Patient verbalized understanding.

## 2019-01-27 NOTE — Telephone Encounter (Signed)
Pt reports moderate edema of both legs, above knees through feet. Onset 10 days ago. H/O CHF, states hospitalized 05/2018. States "Can't get rid of this fluid." Reports SOB with exertion only, speech non halting during call. Denies CP, no redness, warmth to legs. States BP running 140's/?.  Has not been taking lasix for over a month, "Not refilled." Call transferred to practice for consideration of appt within 24 hours, extended ring, no answer. Assured pt NT would route to practice for review, appt scheduling. Care advise given, advised ED if SOB worsens, any CP, increase in edema. Pt verbalizes understanding. Please advise: CB# [phone]  Reason for Disposition . [1] MODERATE leg swelling (e.g., swelling extends up to knees) AND [2] new onset or worsening  Answer Assessment - Initial Assessment Questions 1. ONSET: "When did the swelling start?" (e.g., minutes, hours, days)    10 days ago 2. LOCATION: "What part of the leg is swollen?"  "Are both legs swollen or just one leg?"     Both legs, from thighs to feet 3. SEVERITY: "How bad is the swelling?" (e.g., localized; mild, moderate, severe)  - Localized - small area of swelling localized to one leg  - MILD pedal edema - swelling limited to foot and ankle, pitting edema < 1/4 inch (6 mm) deep, rest and elevation eliminate most or all swelling  - MODERATE edema - swelling of lower leg to knee, pitting edema > 1/4 inch (6 mm) deep, rest and elevation only partially reduce swelling  - SEVERE edema - swelling extends above knee, facial or hand swelling present      Moderate 4. REDNESS: "Does the swelling look red or infected?"     no 5. PAIN: "Is the swelling painful to touch?" If so, ask: "How painful is it?"   (Scale 1-10; mild, moderate or severe)     At times, when tight 6. FEVER: "Do you have a fever?" If so, ask: "What is it, how was it measured, and when did it start?"      no 7. CAUSE: "What do you think is causing the leg swelling?"     CHF 8. MEDICAL HISTORY: "Do you have a history of heart failure, kidney disease, liver failure, or cancer?"     CHF 9. RECURRENT SYMPTOM: "Have you had leg swelling before?" If so, ask: "When was the last time?" "What happened that time?"     Yes, hospitalized 05/2018 10. OTHER SYMPTOMS: "Do you have any other symptoms?" (e.g., chest pain, difficulty breathing)       SOB with exertion only  Protocols used: LEG SWELLING AND EDEMA-A-AH

## 2019-01-27 NOTE — Telephone Encounter (Signed)
I talked to patient after you sent me a message.  He does need to be seen in the office rather than virtual.  He described some mild shortness of breath and mild pedal edema. Will need cmp, bnp and cxr.   He had been off of Lasix for a month so went ahead and refilled lasix to start tonight.  I can  See him  tomorrow in the office.  I am scheduled for virtual visits but I will come in tomorrow at 2 PM so he does not need to go to the urgent care or emergency department.  That would be unnecessary presently.  But if I do not come in that would be his only choice.   Will you let Kim and Amanda know I will be in office tomorrow afternoon.  Please get him scheduled and call him to confirm he is scheduled. 

## 2019-01-27 NOTE — Telephone Encounter (Signed)
I talked to patient after you sent me a message.  He does need to be seen in the office rather than virtual.  He described some mild shortness of breath and mild pedal edema. Will need cmp, bnp and cxr.   He had been off of Lasix for a month so went ahead and refilled lasix to start tonight.  I can  See him  tomorrow in the office.  I am scheduled for virtual visits but I will come in tomorrow at 2 PM so he does not need to go to the urgent care or emergency department.  That would be unnecessary presently.  But if I do not come in that would be his only choice.   Will you let Selena Batten and Marchelle Folks know I will be in office tomorrow afternoon.  Please get him scheduled and call him to confirm he is scheduled.

## 2019-01-28 ENCOUNTER — Ambulatory Visit (HOSPITAL_BASED_OUTPATIENT_CLINIC_OR_DEPARTMENT_OTHER)
Admission: RE | Admit: 2019-01-28 | Discharge: 2019-01-28 | Disposition: A | Discharge status: Home / Self Care | Payer: Federal, State, Local not specified - PPO | Source: Ambulatory Visit | Attending: Medical | Admitting: Medical

## 2019-01-28 ENCOUNTER — Other Ambulatory Visit: Payer: Self-pay

## 2019-01-28 ENCOUNTER — Ambulatory Visit: Payer: Federal, State, Local not specified - PPO | Admitting: Medical

## 2019-01-28 ENCOUNTER — Encounter: Payer: Self-pay | Admitting: Medical

## 2019-01-28 VITALS — BP 140/88 | HR 64 | Temp 95.8°F | Resp 18 | Ht 70.5 in | Wt 363.6 lb

## 2019-01-28 DIAGNOSIS — I509 Heart failure, unspecified: Secondary | ICD-10-CM | POA: Diagnosis not present

## 2019-01-28 DIAGNOSIS — R0600 Dyspnea, unspecified: Secondary | ICD-10-CM | POA: Diagnosis not present

## 2019-01-28 DIAGNOSIS — R600 Localized edema: Secondary | ICD-10-CM

## 2019-01-28 DIAGNOSIS — I10 Essential (primary) hypertension: Secondary | ICD-10-CM

## 2019-01-28 DIAGNOSIS — R0602 Shortness of breath: Secondary | ICD-10-CM | POA: Diagnosis not present

## 2019-01-28 LAB — COMPREHENSIVE METABOLIC PANEL
AG Ratio: 1.6 (calc) (ref 1.0–2.5)
ALT: 12 U/L (ref 9–46)
AST: 19 U/L (ref 10–35)
Albumin: 4.4 g/dL (ref 3.6–5.1)
Alkaline phosphatase (APISO): 46 U/L (ref 35–144)
BUN: 16 mg/dL (ref 7–25)
CO2: 25 mmol/L (ref 20–32)
Calcium: 9.1 mg/dL (ref 8.6–10.3)
Chloride: 104 mmol/L (ref 98–110)
Creat: 1.25 mg/dL (ref 0.70–1.33)
Globulin: 2.8 g/dL (calc) (ref 1.9–3.7)
Glucose, Bld: 101 mg/dL — ABNORMAL HIGH (ref 65–99)
Potassium: 4.2 mmol/L (ref 3.5–5.3)
Sodium: 138 mmol/L (ref 135–146)
Total Bilirubin: 0.5 mg/dL (ref 0.2–1.2)
Total Protein: 7.2 g/dL (ref 6.1–8.1)

## 2019-01-28 LAB — CBC WITH DIFFERENTIAL/PLATELET
Absolute Monocytes: 461 cells/uL (ref 200–950)
Basophils Absolute: 58 cells/uL (ref 0–200)
Basophils Relative: 0.9 %
Eosinophils Absolute: 301 cells/uL (ref 15–500)
Eosinophils Relative: 4.7 %
HCT: 42.2 % (ref 38.5–50.0)
Hemoglobin: 14.1 g/dL (ref 13.2–17.1)
Lymphs Abs: 1747 cells/uL (ref 850–3900)
MCH: 30.4 pg (ref 27.0–33.0)
MCHC: 33.4 g/dL (ref 32.0–36.0)
MCV: 90.9 fL (ref 80.0–100.0)
MPV: 9.9 fL (ref 7.5–12.5)
Monocytes Relative: 7.2 %
Neutro Abs: 3834 cells/uL (ref 1500–7800)
Neutrophils Relative %: 59.9 %
Platelets: 233 10*3/uL (ref 140–400)
RBC: 4.64 10*6/uL (ref 4.20–5.80)
RDW: 13.7 % (ref 11.0–15.0)
Total Lymphocyte: 27.3 %
WBC: 6.4 10*3/uL (ref 3.8–10.8)

## 2019-01-28 LAB — BRAIN NATRIURETIC PEPTIDE: Brain Natriuretic Peptide: 46 pg/mL (ref <100)

## 2019-01-28 NOTE — Telephone Encounter (Signed)
Patient scheduled.

## 2019-01-28 NOTE — Progress Notes (Signed)
Subjective:    Patient ID: Drew [name], male    DOB: 1961-01-05, 59 y.o.   MRN: [id number]  HPI  Pt in for swelling of his legs. Pt states both ankle up to proximal thighs area swollen. Pt has not been on lasix for a month. He has hx of chf. I talked to patient last night to get him scheduled. He feels some better. He states he is breathing better compared to yesterday. No chest pain recently. No popliteal pain.  Pt last weight in august was 347 lbs. Today weight 363 lb. Pt tells me he weighs about same as he did one month ago.  Pt has stopped smoked since he was hospitalized but has gained weight.   Pt now back on lasix.  Pt needs refill of his protonix.  Pt has been out of work since 01/19/2018. As he gained weight and pedal edema increased he missed work.   Review of Systems  Constitutional: Negative for chills, fatigue and fever.  HENT: Negative for congestion.   Respiratory: Positive for shortness of breath. Negative for cough, chest tightness and wheezing.        He states with activity. But not at rest.   He states can sleep at night. Does not have to use pillows but notes often ends up falling asleep in his recliner.  Cardiovascular: Negative for chest pain and palpitations.  Gastrointestinal: Negative for abdominal pain.  Musculoskeletal: Negative for back pain.  Skin: Negative for rash.  Neurological: Negative for dizziness, seizures, weakness and headaches.  Hematological: Negative for adenopathy. Does not bruise/bleed easily.  Psychiatric/Behavioral: Negative for behavioral problems and confusion.    Past Medical History:  Diagnosis Date  . Allergy   . Dilated cardiomyopathy ([place])   . HTN (hypertension)   . Hyperlipidemia 05/24/2018     Social History   Socioeconomic History  . Marital status: Married    Spouse name: Not on file  . Number of children: Not on file  . Years of education: Not on file  . Highest education level: Not on file  Occupational  History  . Not on file  Tobacco Use  . Smoking status: Former Smoker    Packs/day: 1.00    Years: 20.00    Pack years: 20.00    Types: Cigarettes    Quit date: 05/23/2018    Years since quitting: 0.6  . Smokeless tobacco: Never Used  Substance and Sexual Activity  . Alcohol use: Yes    Alcohol/week: 0.0 standard drinks    Comment: 6 pack a week spread out over weekend  . Drug use: No  . Sexual activity: Yes  Other Topics Concern  . Not on file  Social History Narrative   [employer].   Married lives with wife, mother in law and daughter.  7 children.     Social Determinants of Health   Financial Resource Strain: Low Risk   . Difficulty of Paying Living Expenses: Not hard at all  Food Insecurity: No Food Insecurity  . Worried About Charity fundraiser in the Last Year: Never true  . Ran Out of Food in the Last Year: Never true  Transportation Needs: No Transportation Needs  . Lack of Transportation (Medical): No  . Lack of Transportation (Non-Medical): No  Physical Activity: Unknown  . Days of Exercise per Week: Patient refused  . Minutes of Exercise per Session: Patient refused  Stress:   . Feeling of Stress : Not on file  Social Connections: Unknown  .  Frequency of Communication with Friends and Family: Patient refused  . Frequency of Social Gatherings with Friends and Family: Patient refused  . Attends Religious Services: Patient refused  . Active Member of Clubs or Organizations: Patient refused  . Attends Banker Meetings: Patient refused  . Marital Status: Patient refused  Intimate Partner Violence: Unknown  . Fear of Current or Ex-Partner: Patient refused  . Emotionally Abused: Patient refused  . Physically Abused: Patient refused  . Sexually Abused: Patient refused    Past Surgical History:  Procedure Laterality Date  . KNEE ARTHROSCOPY  2009  . RIGHT/LEFT HEART CATH AND CORONARY ANGIOGRAPHY N/A 05/26/2018   Procedure: RIGHT/LEFT HEART CATH AND  CORONARY ANGIOGRAPHY;  Surgeon: Lyn Records, MD;  Location: [facility];  Service: Cardiovascular;  Laterality: N/A;  . THROAT SURGERY  2005    Family History  Problem Relation Age of Onset  . Hearing loss Mother   . Cancer Father   . Heart failure Other   . Heart failure Paternal Grandfather     Allergies  Allergen Reactions  . Tetracyclines & Related Hives and Itching    Current Outpatient Medications on File Prior to Visit  Medication Sig Dispense Refill  . albuterol (PROVENTIL HFA;VENTOLIN HFA) 108 (90 Base) MCG/ACT inhaler Inhale 2 puffs into the lungs every 6 (six) hours as needed for wheezing or shortness of breath. 1 Inhaler 2  . carvedilol (COREG) 12.5 MG tablet Take 1 tablet (12.5 mg total) by mouth 2 (two) times daily with a meal. 60 tablet 3  . furosemide (LASIX) 40 MG tablet TAKE 1 TABLET BY MOUTH TWICE DAILY. IF SUDDEN DROP IN WEIGHT OR DIZZINESS CUT BACK TO ONCE PER DAY 60 tablet 1  . rosuvastatin (CRESTOR) 10 MG tablet TAKE 1 TABLET(10 MG) BY MOUTH DAILY 901 tablet 1  . sacubitril-valsartan (ENTRESTO) 24-26 MG Take 1 tablet by mouth 2 (two) times daily. 90 tablet 3   No current facility-administered medications on file prior to visit.    BP (!) 152/94 (BP Location: Left Arm, Patient Position: Sitting, Cuff Size: Large)   Pulse 64   Temp (!) 95.8 F (35.4 C) (Temporal)   Resp 18   Ht 5' 10.5" (1.791 m)   Wt (!) 363 lb 9.6 oz (164.9 kg)   SpO2 96%   BMI 51.43 kg/m       Objective:   Physical Exam    General Mental Status- Alert. General Appearance- Not in acute distress.   Skin General: Color- Normal Color. Moisture- Normal Moisture.  Neck Carotid Arteries- Normal color. Moisture- Normal Moisture. No carotid bruits. No JVD.  Chest and Lung Exam Auscultation: Breath Sounds:-Normal.  Cardiovascular Auscultation:Rythm- Regular. Murmurs & Other Heart Sounds:Auscultation of the heart reveals- No  Murmurs.  Abdomen Inspection:-Inspeection Normal. Palpation/Percussion:Note:No mass. Palpation and Percussion of the abdomen reveal- Non Tender, Non Distended + BS, no rebound or guarding.   Neurologic Cranial Nerve exam:- CN III-XII intact(No nystagmus), symmetric smile. Strength:- 5/5 equal and symmetric strength both upper and lower extremities.  Lower ext- 2-3+ pedal edema bilaterally. Negative homans signs. Symmetric calfs.      Assessment & Plan:  For htn, continue current medications and now that you are back on lasix bp should improve some.  For chf, recent pedal edema and dyspnea on exertion, will get stat cbc, cmp, bnp and chest xray.  Expect lasix to improve your signs/symptoms. Might need to modify dose.  Recommend that you loose weight. Try weight watchers.  Follow up date to be determined after lab and xray review.  40 minutes spent with pt today. 50% of time spent counseling pt on plan going forward.  Esperanza Richters, PA-C

## 2019-01-28 NOTE — Patient Instructions (Addendum)
For htn, continue current medications and now that you are back on lasix bp should improve some.  For chf, recent pedal edema and dyspnea on exertion, will get stat cbc, cmp, bnp and chest xray.  Expect lasix to improve your signs/symptoms. Might need to modify dose.  Recommend that you loose weight. Try weight watchers.  Follow up date to be determined after lab and xray review.

## 2019-01-30 DIAGNOSIS — I5041 Acute combined systolic (congestive) and diastolic (congestive) heart failure: Secondary | ICD-10-CM | POA: Diagnosis not present

## 2019-03-02 DIAGNOSIS — I5041 Acute combined systolic (congestive) and diastolic (congestive) heart failure: Secondary | ICD-10-CM | POA: Diagnosis not present

## 2019-03-11 ENCOUNTER — Other Ambulatory Visit: Payer: Self-pay

## 2019-03-16 ENCOUNTER — Encounter: Payer: Self-pay | Admitting: Medical

## 2019-03-16 ENCOUNTER — Telehealth: Payer: Self-pay | Admitting: Medical

## 2019-03-16 ENCOUNTER — Ambulatory Visit: Payer: Federal, State, Local not specified - PPO | Admitting: Medical

## 2019-03-16 ENCOUNTER — Ambulatory Visit (HOSPITAL_BASED_OUTPATIENT_CLINIC_OR_DEPARTMENT_OTHER)
Admission: RE | Admit: 2019-03-16 | Discharge: 2019-03-16 | Disposition: A | Discharge status: Home / Self Care | Payer: Federal, State, Local not specified - PPO | Source: Ambulatory Visit | Attending: Medical | Admitting: Medical

## 2019-03-16 ENCOUNTER — Other Ambulatory Visit: Payer: Self-pay

## 2019-03-16 VITALS — BP 128/80 | HR 68 | Resp 18 | Ht 71.0 in | Wt 374.2 lb

## 2019-03-16 DIAGNOSIS — Z87891 Personal history of nicotine dependence: Secondary | ICD-10-CM

## 2019-03-16 DIAGNOSIS — I517 Cardiomegaly: Secondary | ICD-10-CM

## 2019-03-16 DIAGNOSIS — I509 Heart failure, unspecified: Secondary | ICD-10-CM

## 2019-03-16 DIAGNOSIS — I10 Essential (primary) hypertension: Secondary | ICD-10-CM | POA: Diagnosis not present

## 2019-03-16 DIAGNOSIS — R0602 Shortness of breath: Secondary | ICD-10-CM | POA: Diagnosis not present

## 2019-03-16 DIAGNOSIS — R0600 Dyspnea, unspecified: Secondary | ICD-10-CM

## 2019-03-16 LAB — COMPREHENSIVE METABOLIC PANEL
ALT: 16 U/L (ref 0–53)
AST: 21 U/L (ref 0–37)
Albumin: 4.2 g/dL (ref 3.5–5.2)
Alkaline Phosphatase: 43 U/L (ref 39–117)
BUN: 11 mg/dL (ref 6–23)
CO2: 29 mEq/L (ref 19–32)
Calcium: 9.3 mg/dL (ref 8.4–10.5)
Chloride: 102 mEq/L (ref 96–112)
Creatinine, Ser: 1.24 mg/dL (ref 0.40–1.50)
GFR: 72.84 mL/min (ref >60.00)
Glucose, Bld: 108 mg/dL — ABNORMAL HIGH (ref 70–99)
Potassium: 3.5 mEq/L (ref 3.5–5.1)
Sodium: 139 mEq/L (ref 135–145)
Total Bilirubin: 0.6 mg/dL (ref 0.2–1.2)
Total Protein: 7.6 g/dL (ref 6.0–8.3)

## 2019-03-16 LAB — BRAIN NATRIURETIC PEPTIDE: Pro B Natriuretic peptide (BNP): 39 pg/mL (ref 0.0–100.0)

## 2019-03-16 NOTE — Progress Notes (Signed)
Subjective:    Patient ID: Drew [name], male    DOB: 07-Nov-1960, 59 y.o.   MRN: [id number]  HPI  Pt in for pedal edema that is severe. Pt states he has not worked for 3 weeks. 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 He feels like he couldn't walk due to dyspnea. He states at times just walking into building.   Pt states he feels bloated in abdomen as well.   Pt o2 sat is 97% today.Pt sleeps sitting up. He sleeps in recliner.  January he had chest xray that showed below.  He does have history of smoking. Smoked 20 years. He stopped smoking in May.  CLINICAL DATA:  Shortness of breath with exertion. Bilateral pedal edema.  EXAM: CHEST - 2 VIEW  COMPARISON:  Chest x-ray dated September 04, 2018.  FINDINGS: Unchanged borderline cardiomegaly. Pulmonary vascular congestion, slightly worsened when compared to prior study. Streaky atelectasis at the right lung base. No focal consolidation, pleural effusion, or pneumothorax. No acute osseous abnormality.  IMPRESSION: Pulmonary vascular congestion without overt edema.   Pt bnp was 46 in january. His metabolic panel looked good last time. But sugar was mild elevated.   However prior echo does show reduced EF  IMPRESSIONS    1. The left ventricle has severely reduced systolic function, with an  ejection fraction of 20-25%. The cavity size was severely dilated. There  is moderate concentric left ventricular hypertrophy. Left ventricular  diastolic Doppler parameters are  consistent with pseudonormalization. Elevated left atrial and left  ventricular end-diastolic pressures Left ventricular diffuse hypokinesis.  2. The right ventricle has moderately reduced systolic function. The  cavity was severely enlarged. There is no increase in right ventricular  wall thickness. Right ventricular systolic pressure is mildly elevated  with an estimated pressure of 28.5 mmHg.  3. Left atrial size was moderately dilated.  4.  Right atrial size was moderately dilated.  5. Mild thickening of the mitral valve leaflet.  6. The aortic valve is tricuspid. Moderate thickening of the aortic  valve. Aortic valve regurgitation was not assessed by color flow Doppler.  7. The inferior vena cava was dilated in size with <50% respiratory  variability.   SUMMARY    Severe biventricular dilatation with severe LV systolic dysfunction,  estimated LVEF 20-25% with diffuse hypokinesis, grade 2 diastolic  dysfunction and elevated filling pressures. RVEF is moderately  decreased. No prior echocardiogram available for comparison.  FINDINGS  Left Ventricle: The left ventricle has severely reduced systolic  function, with an ejection fraction of 20-25%. The cavity size was  severely dilated. There is moderate concentric left ventricular  hypertrophy. Left ventricular diastolic Doppler  parameters are consistent with pseudonormalization. Elevated left atrial  and left ventricular end-diastolic pressures Left ventricular diffuse  hypokinesis.   Right Ventricle: The right ventricle has moderately reduced systolic  function. The cavity was severely enlarged. There is no increase in right  ventricular wall thickness. Right ventricular systolic pressure is mildly  elevated with an estimated pressure  of 28.5 mmHg.   Left Atrium: Left atrial size was moderately dilated.   Right Atrium: Right atrial size was moderately dilated. Right atrial  pressure is estimated at 8 mmHg.   Interatrial Septum: No atrial level shunt detected by color flow Doppler.   Pericardium: There is no evidence of pericardial effusion.   Mitral Valve: The mitral valve is normal in structure. Mild thickening of  the mitral valve leaflet. Mitral valve regurgitation is mild by color  flow  Doppler.   Tricuspid Valve: The tricuspid valve is normal in structure. Tricuspid  valve regurgitation is mild by color flow Doppler.   Aortic Valve: The aortic  valve is tricuspid Moderate thickening of the  aortic valve. Aortic valve regurgitation was not assessed by color flow  Doppler.   Pulmonic Valve: The pulmonic valve was not assessed. Pulmonic valve  regurgitation was not assessed by color flow Doppler.   Venous: The inferior vena cava is dilated in size with less than 50%  respiratory variability.    Pt states drinks minimal wine on weekends.     Review of Systems     Objective:   Physical Exam  General Mental Status- Alert. General Appearance- Not in acute distress.   Skin General: Color- Normal Color. Moisture- Normal Moisture.  Neck Carotid Arteries- Normal color. Moisture- Normal Moisture. No carotid bruits. No JVD.  Chest and Lung Exam Auscultation: Breath Sounds:-Normal.(lying supine 02 sat 93-94%)  Cardiovascular Auscultation:Rythm- Regular. Murmurs & Other Heart Sounds:Auscultation of the heart reveals- No Murmurs.  Abdomen Inspection:-Inspeection Normal. Palpation/Percussion:Note:No mass. Palpation and Percussion of the abdomen reveal- Non Tender, Non Distended + BS, no rebound or guarding.    Neurologic Cranial Nerve exam:- CN III-XII intact(No nystagmus), symmetric smile. Drift Test:- No drift. Romberg Exam:- Negative.  Heal to Toe Gait exam:-Normal. Finger to Nose:- Normal/Intact Strength:- 5/5 equal and symmetric strength both upper and lower extremities.  Lower ext - 2+ pedal edema bilaterally. Neg homans sign.       Assessment & Plan:  [id number]  You have hx of chf with recent 11 lb wt gain since last visit over past 2 approx months.   We will get chest x-ray, BMP and metabolic panel today.  Results of studies should come back stat.  We will contact you on results.  Make appropriate response to Lasix if necessary.  I did go ahead and place referral to your cardiologist.  Looks like they want to see you for follow-up about 1 year ago.  History of smoking with occasional wheezing in the  past.  I did refer you to pulmonologist in the past and went ahead and referred you again.  I want to see if there is a component of COPD that could be contributing to your dyspnea.  Continue current medications pending modification/work-up results.  If any severe cardiac type signs/symptoms recommend ED evaluation.  Follow-up date will be determined based on how quickly can get you in with specialist as well as study results  45 minutes spent with patient today.  Mackie Pai, PA-C

## 2019-03-16 NOTE — Patient Instructions (Signed)
You have hx of chf with recent 11 lb wt gain since last visit over past 2 approx months.   We will get chest x-ray, BMP and metabolic panel today.  Results of studies should come back stat.  We will contact you on results.  Make appropriate response to Lasix if necessary.  I did go ahead and place referral to your cardiologist.  Looks like they want to see you for follow-up about 1 year ago.  History of smoking with occasional wheezing in the past.  I did refer you to pulmonologist in the past and went ahead and referred you again.  I want to see if there is a component of COPD that could be contributing to your dyspnea.  Continue current medications pending modification/work-up results.  If any severe cardiac type signs/symptoms recommend ED evaluation.  Follow-up date will be determined based on how quickly can get you in with specialist as well as study results

## 2019-03-16 NOTE — Addendum Note (Signed)
Addended by: Gwenevere Abbot on: 03/16/2019 06:33 PM   Modules accepted: Orders

## 2019-03-16 NOTE — Telephone Encounter (Signed)
Placed echo order. Asking for it to be done this week. Will you get him scheduled/do prior authorization.

## 2019-03-17 NOTE — Telephone Encounter (Signed)
Pt is scheduled for 3/4

## 2019-03-17 NOTE — Telephone Encounter (Signed)
thanks

## 2019-03-19 ENCOUNTER — Other Ambulatory Visit: Payer: Self-pay

## 2019-03-19 ENCOUNTER — Ambulatory Visit (HOSPITAL_COMMUNITY): Payer: Federal, State, Local not specified - PPO | Attending: Cardiology

## 2019-03-19 DIAGNOSIS — I517 Cardiomegaly: Secondary | ICD-10-CM | POA: Diagnosis not present

## 2019-03-19 DIAGNOSIS — I509 Heart failure, unspecified: Secondary | ICD-10-CM | POA: Diagnosis not present

## 2019-03-30 DIAGNOSIS — I5041 Acute combined systolic (congestive) and diastolic (congestive) heart failure: Secondary | ICD-10-CM | POA: Diagnosis not present

## 2019-03-31 NOTE — Progress Notes (Signed)
Virtual Visit via Video Note   This visit type was conducted due to national recommendations for restrictions regarding the COVID-19 Pandemic (e.g. social distancing) in an effort to limit this patient's exposure and mitigate transmission in our community.  Due to his co-morbid illnesses, this patient is at least at moderate risk for complications without adequate follow up.  This format is felt to be most appropriate for this patient at this time.  All issues noted in this document were discussed and addressed.  A limited physical exam was performed with this format.  Please refer to the patient's chart for his consent to telehealth for [facility].   Date:  04/01/2019   ID:  Drew [name], DOB 02-20-1960, MRN [id number]  Patient Location: Home Provider Location: Home  PCP:  Drew Richters, PA-C  Cardiologist:  Rollene Rotunda, MD  Electrophysiologist:  None   Evaluation Performed:  Follow-Up Visit  Chief Complaint:  Follow up CHF  History of Present Illness:    Drew [name] is a 59 y.o. male we are following for ongoing assessment and management of systolic CHF. He had a cardiac cath with normal coronaries.  EF was 30% on cath and on echo 20 - 25%, HTN, HL, with history of tobacco abuse.   Was last seen by Dr. Antoine Poche on 06/02/2018 . He unfortunately continues to struggle with fluid retention and reduced salt. He has gained a lot of weight in general due to inactivity and smoking cessation He has had increases in the doses of lasix now on 120 mg daily which he states help but fluid returns.  He is now on Entresto.  He is frustrated by the fluid accumulation and need to take higher doses of diuretics.   The patient \\does  not  have symptoms concerning for COVID-19 infection (fever, chills, cough, or new shortness of breath).    Past Medical History:  Diagnosis Date  . Allergy   . Dilated cardiomyopathy (HCC)   . HTN (hypertension)   . Hyperlipidemia 05/24/2018   Past Surgical  History:  Procedure Laterality Date  . KNEE ARTHROSCOPY  2009  . RIGHT/LEFT HEART CATH AND CORONARY ANGIOGRAPHY N/A 05/26/2018   Procedure: RIGHT/LEFT HEART CATH AND CORONARY ANGIOGRAPHY;  Surgeon: Lyn Records, MD;  Location: [facility]AS[facility];  Service: Cardiovascular;  Laterality: N/A;  . THROAT SURGERY  2005     Current Meds  Medication Sig  . albuterol (PROVENTIL HFA;VENTOLIN HFA) 108 (90 Base) MCG/ACT inhaler Inhale 2 puffs into the lungs every 6 (six) hours as needed for wheezing or shortness of breath.  . carvedilol (COREG) 12.5 MG tablet Take 1 tablet (12.5 mg total) by mouth 2 (two) times daily with a meal.  . rosuvastatin (CRESTOR) 10 MG tablet TAKE 1 TABLET(10 MG) BY MOUTH DAILY  . sacubitril-valsartan (ENTRESTO) 24-26 MG Take 1 tablet by mouth 2 (two) times daily.  . [DISCONTINUED] furosemide (LASIX) 40 MG tablet TAKE 1 TABLET BY MOUTH TWICE DAILY. IF SUDDEN DROP IN WEIGHT OR DIZZINESS CUT BACK TO ONCE PER DAY     Allergies:   Tetracyclines & related   Social History   Tobacco Use  . Smoking status: Former Smoker    Packs/day: 1.00    Years: 20.00    Pack years: 20.00    Types: Cigarettes    Quit date: 05/23/2018    Years since quitting: 0.8  . Smokeless tobacco: Never Used  Substance Use Topics  . Alcohol use: Yes    Alcohol/week: 0.0 standard  drinks    Comment: 6 pack a week spread out over weekend  . Drug use: No     Family Hx: The patient's family history includes Cancer in his father; Hearing loss in his mother; Heart failure in his paternal grandfather and another family member.  ROS:   Please see the history of present illness.    All other systems reviewed and are negative.   Prior CV studies:   The following studies were reviewed today:  Echocardiogram 03/19/2019 1. Left ventricular ejection fraction, by estimation, is 20 to 25%. The  left ventricle has severely decreased function. The left ventricle  demonstrates global hypokinesis. The left  ventricular internal cavity size  was severely dilated. There is mild  concentric left ventricular hypertrophy. Left ventricular diastolic  parameters are consistent with Grade I diastolic dysfunction (impaired  relaxation). Elevated left atrial pressure. The average left ventricular  global longitudinal strain is -16.7 %.  2. Right ventricular systolic function is normal. The right ventricular  size is normal. There is normal pulmonary artery systolic pressure.  3. Left atrial size was mildly dilated.  4. Right atrial size was mildly dilated.  5. The mitral valve is normal in structure and function. Mild mitral  valve regurgitation. No evidence of mitral stenosis.  6. The aortic valve is normal in structure and function. Aortic valve  regurgitation is not visualized. No aortic stenosis is present.  7. The inferior vena cava is normal in size with greater than 50%  respiratory variability, suggesting right atrial pressure of 3 mmHg.  Labs/Other Tests and Data Reviewed:    EKG:  No ECG reviewed.  Recent Labs: 05/24/2018: TSH 2.453 05/26/2018: Magnesium 2.1 01/28/2019: Brain Natriuretic Peptide 46; Hemoglobin 14.1; Platelets 233 03/16/2019: ALT 16; BUN 11; Creatinine, Ser 1.24; Potassium 3.5; Pro B Natriuretic peptide (BNP) 39.0; Sodium 139   Recent Lipid Panel Lab Results  Component Value Date/Time   CHOL 152 05/25/2018 03:43 AM   TRIG 69 05/25/2018 03:43 AM   HDL 36 (L) 05/25/2018 03:43 AM   CHOLHDL 4.2 05/25/2018 03:43 AM   LDLCALC 102 (H) 05/25/2018 03:43 AM    Wt Readings from Last 3 Encounters:  04/01/19 (!) 369 lb (167.4 kg)  03/16/19 (!) 374 lb 3.2 oz (169.7 kg)  01/28/19 (!) 363 lb 9.6 oz (164.9 kg)     Objective:    Vital Signs:  BP 124/72   Pulse (!) 52   Ht 5\' 11"  (1.803 m)   Wt (!) 369 lb (167.4 kg)   BMI 51.47 kg/m    VITAL SIGNS:  reviewed GEN:  no acute distress EYES:  sclerae anicteric, EOMI - Extraocular Movements Intact RESPIRATORY:  normal  respiratory effort, symmetric expansion CARDIOVASCULAR:  no peripheral edema MUSCULOSKELETAL:  no obvious deformities. NEURO:  alert and oriented x 3, no obvious focal deficit PSYCH:  normal affect  ASSESSMENT & PLAN:    1 HFrEF:  Most recent echocardiogram showed his EF of 20-25%. He is not yet on optimal medical therapy but we are working toward this.  With his continued fluid retention and need to increase his diuretics, I will change him to torsemide 80 in the am and 20 mg in the pm.  He will have repeat BMET in one week. He will also have potassium replacement with 20 mEq BID.  He has been having a lot of leg cramping.   Due to his issues with low salt diet, I will have a nutritionist meet with him to help him  with healthy food choices.  He will continue to take [facility]. May be able to titrate this up on next visit if kidney function allows and he is asymptomatic. Also add spironolactone.  He currently has NYHA Class II-III symptoms.  May need to consider Advanced HF clinic and referral for ICD if EF does not improve after optimal medical therapy    2. Hypertension: Not optimal for reduced EF.  Will slowly titrate up Entresto depending on kidney function. His HR is low, so would not go up on carvedilol. Reduced salt intake is recommended.  3. Obesity: Significantly contributing to his breathing status and activity, along with CHF symptoms. He is meeting with a nutritionist once appointment is made.   COVID-19 Education: The signs and symptoms of COVID-19 were discussed with the patient and how to seek care for testing (follow up with PCP or arrange E-visit). The importance of social distancing was discussed today. He is signing up for his COVID vaccine as he is in the Level 4 group   Time:   Today, I have spent 20  minutes with the patient with telehealth technology discussing the above problems.     Medication Adjustments/Labs and Tests Ordered: Current medicines are reviewed at  length with the patient today.  Concerns regarding medicines are outlined above.   Tests Ordered: No orders of the defined types were placed in this encounter.   Medication Changes: No orders of the defined types were placed in this encounter.   Disposition:  Follow up one month   Signed, Drew [name], ANP, AACC  04/01/2019 10:05 AM    [facility]

## 2019-04-01 ENCOUNTER — Encounter: Payer: Self-pay | Admitting: Adult Health

## 2019-04-01 ENCOUNTER — Telehealth: Payer: Self-pay

## 2019-04-01 ENCOUNTER — Telehealth (INDEPENDENT_AMBULATORY_CARE_PROVIDER_SITE_OTHER): Payer: Federal, State, Local not specified - PPO | Admitting: Adult Health

## 2019-04-01 VITALS — BP 124/72 | HR 52 | Ht 71.0 in | Wt 369.0 lb

## 2019-04-01 DIAGNOSIS — I43 Cardiomyopathy in diseases classified elsewhere: Secondary | ICD-10-CM

## 2019-04-01 DIAGNOSIS — Z79899 Other long term (current) drug therapy: Secondary | ICD-10-CM | POA: Diagnosis not present

## 2019-04-01 DIAGNOSIS — I10 Essential (primary) hypertension: Secondary | ICD-10-CM

## 2019-04-01 DIAGNOSIS — Z789 Other specified health status: Secondary | ICD-10-CM | POA: Diagnosis not present

## 2019-04-01 DIAGNOSIS — I5042 Chronic combined systolic (congestive) and diastolic (congestive) heart failure: Secondary | ICD-10-CM

## 2019-04-01 MED ORDER — TORSEMIDE 20 MG PO TABS: ORAL_TABLET | ORAL | 3 refills | Status: DC

## 2019-04-01 NOTE — Patient Instructions (Signed)
Medication Instructions:  STOP- Furosemide START- Torsemide 20 mg take 80 mg(4 tablets) in the morning and 20 mg(1 tablet) in the evening. *If you need a refill on your cardiac medications before your next appointment, please call your pharmacy*   Lab Work: BMP in 1 week If you have labs (blood work) drawn today and your tests are completely normal, you will receive your results only by: Marland Kitchen MyChart Message (if you have MyChart) OR . A paper copy in the mail If you have any lab test that is abnormal or we need to change your treatment, we will call you to review the results.   Testing/Procedures: None Ordered   Follow-Up: At [facility], you and your health needs are our priority.  As part of our continuing mission to provide you with exceptional heart care, we have created designated Provider Care Teams.  These Care Teams include your primary Cardiologist (physician) and Advanced Practice Providers (APPs -  Physician Assistants and Nurse Practitioners) who all work together to provide you with the care you need, when you need it.  We recommend signing up for the patient portal called "MyChart".  Sign up information is provided on this After Visit Summary.  MyChart is used to connect with patients for Virtual Visits (Telemedicine).  Patients are able to view lab/test results, encounter notes, upcoming appointments, etc.  Non-urgent messages can be sent to your provider as well.   To learn more about what you can do with MyChart, go to [email or web address].    Your next appointment:   1 month(s)  The format for your next appointment:   In Person  Provider:   You may see Rollene Rotunda, MD or one of the following Advanced Practice Providers on your designated Care Team:    Theodore Demark, PA-C  Joni Reining, DNP, ANP  Cadence Fransico Michael, NP

## 2019-04-01 NOTE — Telephone Encounter (Signed)
Call placed to patient reference referral to PRE[facility].  'caller not available, pls try back'  Will recall for patient interest and schedule intake for 3/29 has availability.

## 2019-04-02 ENCOUNTER — Telehealth: Payer: Self-pay

## 2019-04-02 NOTE — Telephone Encounter (Signed)
Call to patient reference referral to PRE[facility] -not able to talk--on way to take [facility] to Hospital-. Will f/u with  Patient on Monday to offer PREP.

## 2019-04-03 ENCOUNTER — Telehealth: Payer: Self-pay | Admitting: Cardiology

## 2019-04-03 MED ORDER — POTASSIUM CHLORIDE CRYS ER 20 MEQ PO TBCR: 20.0000 meq | EXTENDED_RELEASE_TABLET | Freq: Two times a day (BID) | ORAL | 3 refills | Status: DC

## 2019-04-03 NOTE — Telephone Encounter (Signed)
Pt c/o medication issue:  1. Name of Medication: Potassium  2. How are you currently taking this medication (dosage and times per day)? n/a  3. Are you having a reaction (difficulty breathing--STAT)? no  4. What is your medication issue? Patient states that Joni Reining was to put in a prescription for Potassium because the over the counter brand would not be strong enough. States she was putting in the prescription due to muscle cramps but no prescription was sent to his pharmacy. Patient aware Samara Deist is off today.

## 2019-04-03 NOTE — Telephone Encounter (Signed)
Per chart review:  VV on 3/17 with Harriet Pho DNP: 1 HFrEF:  Most recent echocardiogram showed his EF of 20-25%. He is not yet on optimal medical therapy but we are working toward this.  With his continued fluid retention and need to increase his diuretics, I will change him to torsemide 80 in the am and 20 mg in the pm.  He will have repeat BMET in one week. He will also have potassium replacement with 20 mEq BID.  He has been having a lot of leg cramping.     Will send potassium rx to pharmacy.  Patient aware.

## 2019-04-06 ENCOUNTER — Telehealth: Payer: Self-pay

## 2019-04-06 NOTE — Telephone Encounter (Signed)
Call placed to patient reference PREP referral.  No answer and unable to leave message.

## 2019-04-06 NOTE — Telephone Encounter (Signed)
Recalled patient. Agreeable to program.  Explained schedule and cost. He will reach out to his insurance to see if it will cover a gym membership.  Scheduled intake appt for 3/24 at 2pm.

## 2019-04-07 ENCOUNTER — Telehealth: Payer: Self-pay | Admitting: Cardiology

## 2019-04-07 NOTE — Telephone Encounter (Signed)
Attempted to contact patient to reschedule his appt that was scheduled for 04/24/19 as provider has appt scheduled that morning.  I was unable to leave a VM, phone just rang will try calling patient again.

## 2019-04-08 ENCOUNTER — Telehealth: Payer: Self-pay

## 2019-04-08 NOTE — Telephone Encounter (Signed)
Schedule PREP intake for 2pm.  No call/no show. Attempted to reach patient without success x 3.

## 2019-04-14 NOTE — Telephone Encounter (Signed)
Attempted to contact patient again to reschedule his appt that was scheduled for 04/24/19, phone just rang.

## 2019-04-21 ENCOUNTER — Institutional Professional Consult (permissible substitution): Payer: Federal, State, Local not specified - PPO | Admitting: Internal Medicine

## 2019-04-24 ENCOUNTER — Ambulatory Visit: Payer: Federal, State, Local not specified - PPO | Admitting: Cardiology

## 2019-04-25 ENCOUNTER — Other Ambulatory Visit: Payer: Self-pay | Admitting: Medical

## 2019-05-12 ENCOUNTER — Telehealth: Payer: Self-pay

## 2019-05-12 NOTE — Telephone Encounter (Signed)
Received call yesterday from Joyce Gross in MD office in f/u on PREP referral.  Attempted to reach prior without success.  Call placed to patient. Unable to leave messages sts person is unavailable.  I will reattempt to reach him for next PREP Class in late May

## 2019-05-19 ENCOUNTER — Other Ambulatory Visit: Payer: Self-pay | Admitting: Medical

## 2019-05-26 ENCOUNTER — Telehealth: Payer: Self-pay

## 2019-05-26 NOTE — Telephone Encounter (Signed)
Attempted to reach patient again reference referral to PRE[facility]. Phone continues to say unavailable. Will search another number for patient.

## 2019-06-08 ENCOUNTER — Telehealth: Payer: Self-pay

## 2019-06-08 NOTE — Telephone Encounter (Signed)
Attempted to call patient again reference PREP referral. Phone continues to say 'caller unavailable' No other contact number listed for patient.

## 2019-06-11 ENCOUNTER — Encounter (HOSPITAL_BASED_OUTPATIENT_CLINIC_OR_DEPARTMENT_OTHER): Payer: Self-pay

## 2019-06-11 ENCOUNTER — Emergency Department (HOSPITAL_BASED_OUTPATIENT_CLINIC_OR_DEPARTMENT_OTHER): Payer: Federal, State, Local not specified - PPO

## 2019-06-11 ENCOUNTER — Other Ambulatory Visit: Payer: Self-pay

## 2019-06-11 ENCOUNTER — Emergency Department (HOSPITAL_BASED_OUTPATIENT_CLINIC_OR_DEPARTMENT_OTHER)
Admission: EM | Admit: 2019-06-11 | Discharge: 2019-06-11 | Disposition: A | Discharge status: Home / Self Care | Payer: Federal, State, Local not specified - PPO | Attending: Emergency Medicine | Admitting: Emergency Medicine

## 2019-06-11 DIAGNOSIS — Z87891 Personal history of nicotine dependence: Secondary | ICD-10-CM | POA: Insufficient documentation

## 2019-06-11 DIAGNOSIS — Z79899 Other long term (current) drug therapy: Secondary | ICD-10-CM | POA: Insufficient documentation

## 2019-06-11 DIAGNOSIS — R600 Localized edema: Secondary | ICD-10-CM | POA: Diagnosis not present

## 2019-06-11 DIAGNOSIS — I10 Essential (primary) hypertension: Secondary | ICD-10-CM | POA: Insufficient documentation

## 2019-06-11 DIAGNOSIS — M19072 Primary osteoarthritis, left ankle and foot: Secondary | ICD-10-CM | POA: Diagnosis not present

## 2019-06-11 DIAGNOSIS — M79672 Pain in left foot: Secondary | ICD-10-CM | POA: Insufficient documentation

## 2019-06-11 DIAGNOSIS — M7989 Other specified soft tissue disorders: Secondary | ICD-10-CM | POA: Diagnosis not present

## 2019-06-11 LAB — COMPREHENSIVE METABOLIC PANEL
ALT: 20 U/L (ref 0–44)
AST: 18 U/L (ref 15–41)
Albumin: 4.6 g/dL (ref 3.5–5.0)
Alkaline Phosphatase: 49 U/L (ref 38–126)
Anion gap: 8 (ref 5–15)
BUN: 22 mg/dL — ABNORMAL HIGH (ref 6–20)
CO2: 30 mmol/L (ref 22–32)
Calcium: 9.6 mg/dL (ref 8.9–10.3)
Chloride: 102 mmol/L (ref 98–111)
Creatinine, Ser: 1.31 mg/dL — ABNORMAL HIGH (ref 0.61–1.24)
GFR calc Af Amer: >60 mL/min (ref >60)
GFR calc non Af Amer: >60 mL/min (ref >60)
Glucose, Bld: 111 mg/dL — ABNORMAL HIGH (ref 70–99)
Potassium: 4 mmol/L (ref 3.5–5.1)
Sodium: 140 mmol/L (ref 135–145)
Total Bilirubin: 0.6 mg/dL (ref 0.3–1.2)
Total Protein: 7.9 g/dL (ref 6.5–8.1)

## 2019-06-11 LAB — CBC WITH DIFFERENTIAL/PLATELET
Abs Immature Granulocytes: 0.02 10*3/uL (ref 0.00–0.07)
Basophils Absolute: 0.1 10*3/uL (ref 0.0–0.1)
Basophils Relative: 1 %
Eosinophils Absolute: 0.3 10*3/uL (ref 0.0–0.5)
Eosinophils Relative: 5 %
HCT: 38.1 % — ABNORMAL LOW (ref 39.0–52.0)
Hemoglobin: 12.6 g/dL — ABNORMAL LOW (ref 13.0–17.0)
Immature Granulocytes: 0 %
Lymphocytes Relative: 31 %
Lymphs Abs: 2 10*3/uL (ref 0.7–4.0)
MCH: 31.4 pg (ref 26.0–34.0)
MCHC: 33.1 g/dL (ref 30.0–36.0)
MCV: 95 fL (ref 80.0–100.0)
Monocytes Absolute: 0.6 10*3/uL (ref 0.1–1.0)
Monocytes Relative: 8 %
Neutro Abs: 3.6 10*3/uL (ref 1.7–7.7)
Neutrophils Relative %: 55 %
Platelets: 248 10*3/uL (ref 150–400)
RBC: 4.01 MIL/uL — ABNORMAL LOW (ref 4.22–5.81)
RDW: 13.1 % (ref 11.5–15.5)
WBC: 6.5 10*3/uL (ref 4.0–10.5)
nRBC: 0 % (ref 0.0–0.2)

## 2019-06-11 MED FILL — predniSONE 10 MG TABS: 10 | 5 days supply | Qty: 20 | Fill #0

## 2019-06-11 NOTE — ED Notes (Signed)
Pt discharged during downtown, see downtime forms.

## 2019-06-11 NOTE — ED Notes (Signed)
Attempted IV access, unable to obtain, will attempt venipuncture

## 2019-06-11 NOTE — ED Provider Notes (Signed)
[facility] EMERGENCY DEPARTMENT Provider Note   CSN: [id number] Arrival date & time: 06/11/19  1009     History Chief Complaint  Patient presents with  . Foot Pain    Drew [name] is a 59 y.o. male history hypertension, CHF, hyperlipidemia, obesity.  Patient presents today for left first MTP pain onset 2 days ago.  Pain is sharp constant worsened with ambulation nonradiating improved with rest and moderate intensity.  Denies similar pain in the past.  Additionally patient feels that his left leg may be more swollen compared to his right, he reports he has been compliant with his diuretic therapies.  Reports that the swelling began 2 days ago as well.  Denies fall/injury, fever/chills, numbness/weakness, tingling, chest pain, shortness of breath, abdominal pain, nausea/vomiting or additional concerns.  HPI     Past Medical History:  Diagnosis Date  . Allergy   . Dilated cardiomyopathy (HCC)   . HTN (hypertension)   . Hyperlipidemia 05/24/2018    Patient Active Problem List   Diagnosis Date Noted  . Acute systolic HF (heart failure) (HCC) 06/02/2018  . Essential hypertension 05/24/2018  . Hyperlipidemia 05/24/2018  . Tobacco abuse 05/24/2018  . Alcohol use 05/24/2018  . Obesity, Class III, BMI 40-49.9 (morbid obesity) (HCC) 05/24/2018  . Acute CHF (congestive heart failure) (HCC) 05/24/2018  . New onset of congestive heart failure (HCC) 05/23/2018  . Wellness examination 02/26/2014  . Knee pain, left 02/11/2014  . Seasonal allergies 02/11/2014    Past Surgical History:  Procedure Laterality Date  . KNEE ARTHROSCOPY  2009  . RIGHT/LEFT HEART CATH AND CORONARY ANGIOGRAPHY N/A 05/26/2018   Procedure: RIGHT/LEFT HEART CATH AND CORONARY ANGIOGRAPHY;  Surgeon: Lyn Records, MD;  Location: [facility];  Service: Cardiovascular;  Laterality: N/A;  . THROAT SURGERY  2005       Family History  Problem Relation Age of Onset  . Hearing loss Mother   .  Cancer Father   . Heart failure Other   . Heart failure Paternal Grandfather     Social History   Tobacco Use  . Smoking status: Former Smoker    Packs/day: 1.00    Years: 20.00    Pack years: 20.00    Types: Cigarettes    Quit date: 05/23/2018    Years since quitting: 1.0  . Smokeless tobacco: Never Used  Substance Use Topics  . Alcohol use: Yes    Alcohol/week: 0.0 standard drinks    Comment: 6 pack a week spread out over weekend  . Drug use: No    Home Medications Prior to Admission medications   Medication Sig Start Date End Date Taking? Authorizing Provider  albuterol (PROVENTIL HFA;VENTOLIN HFA) 108 (90 Base) MCG/ACT inhaler Inhale 2 puffs into the lungs every 6 (six) hours as needed for wheezing or shortness of breath. 11/29/17   Saguier, Ramon Dredge, PA-C  carvedilol (COREG) 12.5 MG tablet TAKE 1 TABLET(12.5 MG) BY MOUTH TWICE DAILY WITH A MEAL 05/19/19   Saguier, Ramon Dredge, PA-C  potassium chloride SA (KLOR-CON) 20 MEQ tablet Take 1 tablet (20 mEq total) by mouth 2 (two) times daily. 04/03/19 07/02/19  Jodelle Gross, NP  rosuvastatin (CRESTOR) 10 MG tablet TAKE 1 TABLET BY MOUTH DAILY 04/25/19   Saguier, Ramon Dredge, PA-C  sacubitril-valsartan (ENTRESTO) 24-26 MG Take 1 tablet by mouth 2 (two) times daily. 06/05/18   Rollene Rotunda, MD  torsemide (DEMADEX) 20 MG tablet Take 80 mg (4 tablets) in the morning and 20 mg (1  tablets) in the evening 04/01/19   Jodelle Gross, NP    Allergies    Tetracyclines & related  Review of Systems   Review of Systems Ten systems are reviewed and are negative for acute change except as noted in the HPI  Physical Exam Updated Vital Signs BP 120/71 (BP Location: Right Arm)   Pulse 71   Temp 98.3 F (36.8 C) (Oral)   Resp 18   Ht 5\' 10"  (1.778 m)   Wt (!) 169.6 kg   SpO2 97%   BMI 53.66 kg/m   Physical Exam Constitutional:      General: He is not in acute distress.    Appearance: Normal appearance. He is well-developed. He is obese.  He is not ill-appearing or diaphoretic.  HENT:     Head: Normocephalic and atraumatic.     Right Ear: External ear normal.     Left Ear: External ear normal.     Nose: Nose normal.  Eyes:     General: Vision grossly intact. Gaze aligned appropriately.     Pupils: Pupils are equal, round, and reactive to light.  Neck:     Trachea: Trachea and phonation normal. No tracheal deviation.  Cardiovascular:     Rate and Rhythm: Normal rate and regular rhythm.     Pulses: Normal pulses.          Dorsalis pedis pulses are 2+ on the right side and 2+ on the left side.  Pulmonary:     Effort: Pulmonary effort is normal. No accessory muscle usage or respiratory distress.     Breath sounds: Normal breath sounds and air entry.  Abdominal:     General: There is no distension.     Palpations: Abdomen is soft.     Tenderness: There is no abdominal tenderness. There is no guarding or rebound.  Musculoskeletal:        General: No tenderness. Normal range of motion.     Cervical back: Normal range of motion.     Right lower leg: Edema present.     Left lower leg: Edema present.       Feet:  Feet:     Right foot:     Protective Sensation: 5 sites tested. 5 sites sensed.     Skin integrity: Skin integrity normal.     Left foot:     Protective Sensation: 5 sites tested. 5 sites sensed.     Skin integrity: Skin integrity normal.  Skin:    General: Skin is warm and dry.  Neurological:     Mental Status: He is alert.     GCS: GCS eye subscore is 4. GCS verbal subscore is 5. GCS motor subscore is 6.     Comments: Speech is clear and goal oriented, follows commands Major Cranial nerves without deficit, no facial droop Moves extremities without ataxia, coordination intact  Psychiatric:        Behavior: Behavior normal.     ED Results / Procedures / Treatments   Labs (all labs ordered are listed, but only abnormal results are displayed) Labs Reviewed  CBC WITH DIFFERENTIAL/PLATELET - Abnormal;  Notable for the following components:      Result Value   RBC 4.01 (*)    Hemoglobin 12.6 (*)    HCT 38.1 (*)    All other components within normal limits  COMPREHENSIVE METABOLIC PANEL - Abnormal; Notable for the following components:   Glucose, Bld 111 (*)    BUN 22 (*)  Creatinine, Ser 1.31 (*)    All other components within normal limits    EKG None  Radiology US Venous Img Lower  Left (DVT Study)  Result Date: 06/11/2019 CLINICAL DATA:  Lower extremity pain and edema EXAM: LEFT LOWER EXTREMITY VENOUS DUPLEX ULTRASOUND TECHNIQUE: Gray-scale sonography with graded compression, as well as color Doppler and duplex ultrasound were performed to evaluate the left lower extremity deep venous system from the level of the common femoral vein and including the common femoral, femoral, profunda femoral, popliteal and calf veins including the posterior tibial, peroneal and gastrocnemius veins when visible. The superficial great saphenous vein was also interrogated. Spectral Doppler was utilized to evaluate flow at rest and with distal augmentation maneuvers in the common femoral, femoral and popliteal veins. COMPARISON:  None. FINDINGS: Contralateral Common Femoral Vein: Respiratory phasicity is normal and symmetric with the symptomatic side. No evidence of thrombus. Normal compressibility. Common Femoral Vein: No evidence of thrombus. Normal compressibility, respiratory phasicity and response to augmentation. Saphenofemoral Junction: No evidence of thrombus. Normal compressibility and flow on color Doppler imaging. Profunda Femoral Vein: No evidence of thrombus. Normal compressibility and flow on color Doppler imaging. Femoral Vein: No evidence of thrombus. Normal compressibility, respiratory phasicity and response to augmentation. Popliteal Vein: No evidence of thrombus. Normal compressibility, respiratory phasicity and response to augmentation. Calf Veins: No evidence of thrombus. Normal  compressibility and flow on color Doppler imaging. Superficial Great Saphenous Vein: No evidence of thrombus. Normal compressibility. Venous Reflux:  None. Other Findings:  None. IMPRESSION: No evidence of deep venous thrombosis in the left lower extremity. Right common femoral vein also patent. Electronically Signed   By: Bretta Bang III M.D.   On: 06/11/2019 11:58   DG Foot Complete Left  Result Date: 06/11/2019 CLINICAL DATA:  No injury.  First toe swelling. EXAM: LEFT FOOT - COMPLETE 3+ VIEW COMPARISON:  None. FINDINGS: Mild degenerative change over the first MTP joint. No bony erosions or focal soft tissue abnormality. No acute fracture. Remaining bones and soft tissues are unremarkable. IMPRESSION: No acute findings. Mild degenerative change at the first MTP joint. Electronically Signed   By: Elberta Fortis M.D.   On: 06/11/2019 11:34    Procedures Procedures (including critical care time)  Medications Ordered in ED Medications - No data to display  ED Course  I have reviewed the triage vital signs and the nursing notes.  Pertinent labs & imaging results that were available during my care of the patient were reviewed by me and considered in my medical decision making (see chart for details).    MDM Rules/Calculators/A&P                      Additional History Obtained: 1. Nursing notes from this visit. 2. Previous records within EMR system.  Patient's most recent visit with Dr. Antoine Poche that he attended was on 04/03/2019.  Looks like his ejection fraction is 20-25%, he is on torsemide 80 mg morning 20 mg at night.  Patient arrives with left first MTP pain onset 2 days ago.  He reports some left leg swelling as well.  On examination there is no clear difference between either leg, will obtain DVT ultrasound for assessment.  Additionally there is no skin break or overlying skin changes suggestive of infection.  Area of pain is suggestive of gout but patient has no gout history, will  obtain x-ray of the left foot for further assessment.  Patient has no chest pain or shortness  of breath overall, he reports his symptoms of CHF are well controlled with his torsemide.  Will obtain basic labs including CBC and CMP. - I have reviewed and interpreted the following labs.  CBC shows no leukocytosis to suggest infection, and mild anemia of 12.6, patient without bleeding symptoms.  CMP shows creatinine of 1.31 and BUN 22 minimally elevated from priors.  No emergent electrolyte derangement or acute elevation of LFTs.  DG Left Foot:    IMPRESSION:  No acute findings.    Mild degenerative change at the first MTP joint.  I have personally reviewed patient's left foot x-ray and agree with radiologist interpretation.  DVT US LLE:    IMPRESSION:  No evidence of deep venous thrombosis in the left lower extremity.  Right common femoral vein also patent.  - Patient reassessed he is ambulating to the bathroom without assistance or difficulty.  Suspect patient's symptoms today may be due to arthritis of the first MTP joint versus possible gout.  The area is not erythematous or swollen, does not appear infected.  No evidence of cellulitis, septic arthritis, DVT, compartment syndrome, neurovascular compromise or other emergent pathologies requiring further ED work-up.  Plan of care is to start patient on prednisone burst to help with symptoms and have him follow-up with his primary care provider for reevaluation next week, rice therapy encouraged.  Of note patient denies any adverse reaction to steroid medications in the past or any history of diabetes.   At this time there does not appear to be any evidence of an acute emergency medical condition and the patient appears stable for discharge with appropriate outpatient follow up. Diagnosis was discussed with patient who verbalizes understanding of care plan and is agreeable to discharge. I have discussed return precautions with patient who  verbalizes understanding. Patient encouraged to follow-up with their PCP. All questions answered.  Patient's case discussed with Dr. Ronnald Nian who agrees with plan to discharge with prednisone burst and PCP follow-up.   Of note patient was discharged during downtime as he did not want to stay for the system to be repaired.  Patient aware to check his MyChart account for complete AVS.  Note: Portions of this report may have been transcribed using voice recognition software. Every effort was made to ensure accuracy; however, inadvertent computerized transcription errors may still be present. Final Clinical Impression(s) / ED Diagnoses Final diagnoses:  Pain in left foot    Rx / DC Orders ED Discharge Orders    None       Gari Crown 06/11/19 [place], [place], DO 06/11/19 1439

## 2019-06-11 NOTE — ED Triage Notes (Signed)
Pt arrives with c/o pain to left foot with swelling, denies any injury to the area, foot  Is sensitive to touch. Pt is on lasix also reports fluid retention.

## 2019-06-11 NOTE — Discharge Instructions (Addendum)
At this time there does not appear to be the presence of an emergent medical condition, however there is always the potential for conditions to change. Please read and follow the below instructions.  Please return to the Emergency Department immediately for any new or worsening symptoms. Please be sure to follow up with your Primary Care Provider within one week regarding your visit today; please call their office to schedule an appointment even if you are feeling better for a follow-up visit. Use the medication prednisone as prescribed to help with your symptoms.  Use rest ice and elevation to help with your symptoms.  See your primary care doctor next week for a recheck.  Get help right away if: Your foot is numb or tingling. Your foot or toes are swollen. Your foot or toes turn white or blue. You have warmth and redness along your foot. You have fever or chills You have any new/concerning or worsening of symptoms  Please read the additional information packets attached to your discharge summary.  Do not take your medicine if  develop an itchy rash, swelling in your mouth or lips, or difficulty breathing; call 911 and seek immediate emergency medical attention if this occurs.  Note: Portions of this text may have been transcribed using voice recognition software. Every effort was made to ensure accuracy; however, inadvertent computerized transcription errors may still be present.

## 2019-06-17 ENCOUNTER — Other Ambulatory Visit: Payer: Self-pay | Admitting: Cardiology

## 2019-06-30 ENCOUNTER — Other Ambulatory Visit: Payer: Self-pay

## 2019-06-30 ENCOUNTER — Ambulatory Visit (HOSPITAL_BASED_OUTPATIENT_CLINIC_OR_DEPARTMENT_OTHER)
Admission: RE | Admit: 2019-06-30 | Discharge: 2019-06-30 | Disposition: A | Discharge status: Home / Self Care | Payer: Federal, State, Local not specified - PPO | Source: Ambulatory Visit | Attending: Medical | Admitting: Medical

## 2019-06-30 ENCOUNTER — Ambulatory Visit: Payer: Federal, State, Local not specified - PPO | Admitting: Medical

## 2019-06-30 ENCOUNTER — Telehealth: Payer: Self-pay | Admitting: Medical

## 2019-06-30 VITALS — BP 132/63 | HR 84 | Temp 97.0°F | Resp 20 | Ht 71.0 in | Wt 378.6 lb

## 2019-06-30 DIAGNOSIS — R600 Localized edema: Secondary | ICD-10-CM

## 2019-06-30 DIAGNOSIS — R0600 Dyspnea, unspecified: Secondary | ICD-10-CM | POA: Diagnosis not present

## 2019-06-30 DIAGNOSIS — I517 Cardiomegaly: Secondary | ICD-10-CM

## 2019-06-30 DIAGNOSIS — M79675 Pain in left toe(s): Secondary | ICD-10-CM | POA: Diagnosis not present

## 2019-06-30 DIAGNOSIS — I509 Heart failure, unspecified: Secondary | ICD-10-CM | POA: Insufficient documentation

## 2019-06-30 DIAGNOSIS — I10 Essential (primary) hypertension: Secondary | ICD-10-CM | POA: Diagnosis not present

## 2019-06-30 LAB — CBC WITH DIFFERENTIAL/PLATELET
Basophils Absolute: 0 10*3/uL (ref 0.0–0.1)
Basophils Relative: 0.9 % (ref 0.0–3.0)
Eosinophils Absolute: 0.3 10*3/uL (ref 0.0–0.7)
Eosinophils Relative: 5.9 % — ABNORMAL HIGH (ref 0.0–5.0)
HCT: 34.8 % — ABNORMAL LOW (ref 39.0–52.0)
Hemoglobin: 11.6 g/dL — ABNORMAL LOW (ref 13.0–17.0)
Lymphocytes Relative: 28.4 % (ref 12.0–46.0)
Lymphs Abs: 1.5 10*3/uL (ref 0.7–4.0)
MCHC: 33.3 g/dL (ref 30.0–36.0)
MCV: 96.9 fl (ref 78.0–100.0)
Monocytes Absolute: 0.3 10*3/uL (ref 0.1–1.0)
Monocytes Relative: 6.5 % (ref 3.0–12.0)
Neutro Abs: 3.1 10*3/uL (ref 1.4–7.7)
Neutrophils Relative %: 58.3 % (ref 43.0–77.0)
Platelets: 226 10*3/uL (ref 150.0–400.0)
RBC: 3.59 Mil/uL — ABNORMAL LOW (ref 4.22–5.81)
RDW: 13.9 % (ref 11.5–15.5)
WBC: 5.3 10*3/uL (ref 4.0–10.5)

## 2019-06-30 LAB — BRAIN NATRIURETIC PEPTIDE: Pro B Natriuretic peptide (BNP): 45 pg/mL (ref 0.0–100.0)

## 2019-06-30 LAB — COMPREHENSIVE METABOLIC PANEL
ALT: 18 U/L (ref 0–53)
AST: 14 U/L (ref 0–37)
Albumin: 4.4 g/dL (ref 3.5–5.2)
Alkaline Phosphatase: 44 U/L (ref 39–117)
BUN: 27 mg/dL — ABNORMAL HIGH (ref 6–23)
CO2: 28 mEq/L (ref 19–32)
Calcium: 9.4 mg/dL (ref 8.4–10.5)
Chloride: 105 mEq/L (ref 96–112)
Creatinine, Ser: 1.21 mg/dL (ref 0.40–1.50)
GFR: 74.85 mL/min (ref >60.00)
Glucose, Bld: 109 mg/dL — ABNORMAL HIGH (ref 70–99)
Potassium: 4 mEq/L (ref 3.5–5.1)
Sodium: 138 mEq/L (ref 135–145)
Total Bilirubin: 0.5 mg/dL (ref 0.2–1.2)
Total Protein: 7 g/dL (ref 6.0–8.3)

## 2019-06-30 LAB — URIC ACID: Uric Acid, Serum: 11.7 mg/dL — ABNORMAL HIGH (ref 4.0–7.8)

## 2019-06-30 MED ORDER — ALLOPURINOL 100 MG PO TABS
100.0000 mg | ORAL_TABLET | Freq: Every day | ORAL | 6 refills | Status: DC
Start: 2019-06-30 — End: 2020-03-03

## 2019-06-30 NOTE — Progress Notes (Signed)
Subjective:    Patient ID: Drew [name], male    DOB: [birth year], 59 y.o.   MRN: [id number]  HPI  Pt in for some left foot swelling. Pt left great toe was hurting and he went to the ED. Pain started around 06/11/2019.   Pt was treated with burst of prednisone. Pt did not have uric acid test on review. Did have cmp and cbc.  Pt states with prednisone his pain and swelling did not resolve. Then this past Saturday he states took 2 alleve and toe pain finally stopped.  Xray of pt left foot showed. IMPRESSION: No acute findings.  Mild degenerative change at the first MTP joint.  ED noted some bilateral pedal edema. He has hx of chf. ED did left lower ext [place]. Negative for dvt.   Pt missed hi last appointment with cardiologist. Pt expresses finances are tight and therefore did not go.  One year since he quit smoking.  Pt does report some daily shortness of breath depending on how much he walks.   1. Left ventricular ejection fraction, by estimation, is 20 to 25%. The left ventricle has severely decreased function. The left ventricle demonstrates global hypokinesis. The left ventricular internal cavity size was severely dilated. There is mild concentric left ventricular hypertrophy. Left ventricular diastolic parameters are consistent with Grade I diastolic dysfunction (impaired relaxation). Elevated left atrial pressure. The average left ventricular global longitudinal strain is -16.7 %.  Pt is on entresto, coreg and torsemide.  Sometimes he has to sit in recliner to sleep.   Pt wants to return to work. He has been on sick leave since February. I don't recall filling out any paperwork. Pt states he did not qualify for family medical leave.     Pt states last appointment with cardiologist was cancelled and then rescheduled.     Review of Systems  Constitutional: Negative for chills, diaphoresis and fatigue.  Respiratory: Positive for shortness of breath. Negative for cough,  chest tightness and wheezing.   Cardiovascular: Negative for chest pain and palpitations.  Gastrointestinal: Negative for abdominal pain.  Musculoskeletal: Negative for back pain.       Toe pain. See hpi.  Neurological: Negative for dizziness, light-headedness and headaches.  Hematological: Negative for adenopathy. Does not bruise/bleed easily.  Psychiatric/Behavioral: Negative for behavioral problems and confusion.   Past Medical History:  Diagnosis Date  . Allergy   . Dilated cardiomyopathy (HCC)   . HTN (hypertension)   . Hyperlipidemia 05/24/2018     Social History   Socioeconomic History  . Marital status: Married    Spouse name: Not on file  . Number of children: Not on file  . Years of education: Not on file  . Highest education level: Not on file  Occupational History  . Not on file  Tobacco Use  . Smoking status: Former Smoker    Packs/day: 1.00    Years: 20.00    Pack years: 20.00    Types: Cigarettes    Quit date: 05/23/2018    Years since quitting: 1.1  . Smokeless tobacco: Never Used  Substance and Sexual Activity  . Alcohol use: Yes    Alcohol/week: 0.0 standard drinks    Comment: 6 pack a week spread out over weekend  . Drug use: No  . Sexual activity: Yes  Other Topics Concern  . Not on file  Social History Narrative   [employer].   Married lives with wife, mother in law and daughter.  7 children.  Social Determinants of Health   Financial Resource Strain:   . Difficulty of Paying Living Expenses:   Food Insecurity:   . Worried About Charity fundraiser in the Last Year:   . Arboriculturist in the Last Year:   Transportation Needs:   . Film/video editor (Medical):   Marland Kitchen Lack of Transportation (Non-Medical):   Physical Activity:   . Days of Exercise per Week:   . Minutes of Exercise per Session:   Stress:   . Feeling of Stress :   Social Connections:   . Frequency of Communication with Friends and Family:   . Frequency of Social Gatherings  with Friends and Family:   . Attends Religious Services:   . Active Member of Clubs or Organizations:   . Attends Archivist Meetings:   Marland Kitchen Marital Status:   Intimate Partner Violence:   . Fear of Current or Ex-Partner:   . Emotionally Abused:   Marland Kitchen Physically Abused:   . Sexually Abused:     Past Surgical History:  Procedure Laterality Date  . KNEE ARTHROSCOPY  2009  . RIGHT/LEFT HEART CATH AND CORONARY ANGIOGRAPHY N/A 05/26/2018   Procedure: RIGHT/LEFT HEART CATH AND CORONARY ANGIOGRAPHY;  Surgeon: Belva Crome, MD;  Location: [facility];  Service: Cardiovascular;  Laterality: N/A;  . THROAT SURGERY  2005    Family History  Problem Relation Age of Onset  . Hearing loss Mother   . Cancer Father   . Heart failure Other   . Heart failure Paternal Grandfather     Allergies  Allergen Reactions  . Tetracyclines & Related Hives and Itching    Current Outpatient Medications on File Prior to Visit  Medication Sig Dispense Refill  . albuterol (PROVENTIL HFA;VENTOLIN HFA) 108 (90 Base) MCG/ACT inhaler Inhale 2 puffs into the lungs every 6 (six) hours as needed for wheezing or shortness of breath. 1 Inhaler 2  . carvedilol (COREG) 12.5 MG tablet TAKE 1 TABLET(12.5 MG) BY MOUTH TWICE DAILY WITH A MEAL 60 tablet 3  . ENTRESTO 24-26 MG TAKE 1 TABLET BY MOUTH TWICE DAILY 90 tablet 3  . potassium chloride SA (KLOR-CON) 20 MEQ tablet Take 1 tablet (20 mEq total) by mouth 2 (two) times daily. 60 tablet 3  . rosuvastatin (CRESTOR) 10 MG tablet TAKE 1 TABLET BY MOUTH DAILY 90 tablet 2  . torsemide (DEMADEX) 20 MG tablet Take 80 mg (4 tablets) in the morning and 20 mg (1 tablets) in the evening 540 tablet 3   No current facility-administered medications on file prior to visit.    BP 132/63 (BP Location: Left Arm, Patient Position: Sitting, Cuff Size: Large)   Pulse 84   Temp (!) 97 F (36.1 C) (Temporal)   Resp 20   Ht 5\' 11"  (1.803 m)   Wt (!) 378 lb 9.6 oz (171.7 kg)    SpO2 93%   BMI 52.80 kg/m       Objective:   Physical Exam   General Mental Status- Alert. General Appearance- Not in acute distress.   Skin General: Color- Normal Color. Moisture- Normal Moisture.  Neck Carotid Arteries- Normal color. Moisture- Normal Moisture. No carotid bruits. No JVD.  Chest and Lung Exam Auscultation: Breath Sounds:-Normal.  Cardiovascular Auscultation:Rythm- Regular. Murmurs & Other Heart Sounds:Auscultation of the heart reveals- No Murmurs.  Abdomen Inspection:-Inspeection Normal. Palpation/Percussion:Note:No mass. Palpation and Percussion of the abdomen reveal- Non Tender, Non Distended + BS, no rebound or guarding.  Neurologic Cranial Nerve exam:- CN III-XII intact(No nystagmus), symmetric smile. Strength:- 5/5 equal and symmetric strength both upper and lower extremities.  Left lower ext- 1+ symmetric edema. Negative homans signs.  Left great toe- small area medial aspect direct tender but not red or swollen.       Assessment & Plan:  You do have history of CHF, cardiomegaly and hypertension.  Blood pressure is well controlled today.  With 1+ bilateral pedal edema we will go ahead and get CMP, CBC, BNP and chest x-ray.  With your history of low ejection fraction and 25% range, I do want to verify that labs and imaging studies are stable.  May need to send/notify your cardiologist of results since last appointment with them with unexpectedly canceled per your report.  Then you must reschedule appointment.  With your dyspnea on exertion, your O2 sat was checked today and you maintain 94%.  Will prescribe a return to work note and include information about CHF prior authorization.  We will notify them that you might need 3 consecutive days off intermittently in the event of flares.  Pedal edema recently and left lower extremity ultrasound showed no DVT.  Left toe pain might represent gout.  Interestingly enough the report that prednisone  seemed not to help but Aleve seemed to resolve most of the pain.  We will get an uric acid level.  Might need to prescribe different medication if uric acid is elevated.  Follow-up in 1 month or sooner if needed based on your signs/symptoms or abnormal lab results.  Esperanza Richters, PA-C   Time spent with patient today was  40 minutes which consisted of chart review, discussing diagnosis, work up, treatment and documentation.

## 2019-06-30 NOTE — Telephone Encounter (Signed)
Rx allopurinol sent to pt pharmacy. 

## 2019-06-30 NOTE — Patient Instructions (Signed)
You do have history of CHF, cardiomegaly and hypertension.  Blood pressure is well controlled today.  With 1+ bilateral pedal edema we will go ahead and get CMP, CBC, BNP and chest x-ray.  With your history of low ejection fraction and 25% range, I do want to verify that labs and imaging studies are stable.  May need to send/notify your cardiologist of results since last appointment with them with unexpectedly canceled per your report.  Then you must reschedule appointment.  With your dyspnea on exertion, your O2 sat was checked today and you maintain 94%.  Will prescribe a return to work note and include information about CHF prior authorization.  We will notify them that you might need 3 consecutive days off intermittently in the event of flares.  Pedal edema recently and left lower extremity ultrasound showed no DVT.  Left toe pain might represent gout.  Interestingly enough the report that prednisone seemed not to help but Aleve seemed to resolve most of the pain.  We will get an uric acid level.  Might need to prescribe different medication if uric acid is elevated.  Follow-up in 1 month or sooner if needed based on your signs/symptoms or abnormal lab results.

## 2019-07-01 ENCOUNTER — Telehealth: Payer: Self-pay | Admitting: Medical

## 2019-07-01 NOTE — Telephone Encounter (Signed)
Patient calling in regards to lab results. 

## 2019-07-30 ENCOUNTER — Ambulatory Visit: Payer: Federal, State, Local not specified - PPO | Admitting: Medical

## 2019-08-03 ENCOUNTER — Ambulatory Visit: Payer: Federal, State, Local not specified - PPO | Admitting: Medical

## 2019-08-03 DIAGNOSIS — Z0289 Encounter for other administrative examinations: Secondary | ICD-10-CM

## 2019-09-25 ENCOUNTER — Other Ambulatory Visit: Payer: Self-pay | Admitting: Medical

## 2019-11-13 ENCOUNTER — Ambulatory Visit (HOSPITAL_BASED_OUTPATIENT_CLINIC_OR_DEPARTMENT_OTHER)
Admission: RE | Admit: 2019-11-13 | Discharge: 2019-11-13 | Disposition: A | Discharge status: Home / Self Care | Payer: Federal, State, Local not specified - PPO | Source: Ambulatory Visit | Attending: Medical | Admitting: Medical

## 2019-11-13 ENCOUNTER — Other Ambulatory Visit: Payer: Self-pay

## 2019-11-13 ENCOUNTER — Ambulatory Visit (INDEPENDENT_AMBULATORY_CARE_PROVIDER_SITE_OTHER): Payer: Federal, State, Local not specified - PPO | Admitting: Medical

## 2019-11-13 VITALS — BP 122/62 | HR 65 | Resp 18 | Ht 71.0 in | Wt 371.8 lb

## 2019-11-13 DIAGNOSIS — I509 Heart failure, unspecified: Secondary | ICD-10-CM | POA: Insufficient documentation

## 2019-11-13 DIAGNOSIS — R600 Localized edema: Secondary | ICD-10-CM

## 2019-11-13 DIAGNOSIS — R21 Rash and other nonspecific skin eruption: Secondary | ICD-10-CM | POA: Diagnosis not present

## 2019-11-13 DIAGNOSIS — I10 Essential (primary) hypertension: Secondary | ICD-10-CM

## 2019-11-13 DIAGNOSIS — M25571 Pain in right ankle and joints of right foot: Secondary | ICD-10-CM | POA: Diagnosis not present

## 2019-11-13 DIAGNOSIS — M7989 Other specified soft tissue disorders: Secondary | ICD-10-CM | POA: Diagnosis not present

## 2019-11-13 DIAGNOSIS — I517 Cardiomegaly: Secondary | ICD-10-CM

## 2019-11-13 DIAGNOSIS — M109 Gout, unspecified: Secondary | ICD-10-CM

## 2019-11-13 DIAGNOSIS — R0602 Shortness of breath: Secondary | ICD-10-CM | POA: Diagnosis not present

## 2019-11-13 MED ORDER — COLCHICINE 0.6 MG PO TABS
0.6000 mg | ORAL_TABLET | Freq: Two times a day (BID) | ORAL | 0 refills | Status: DC
Start: 2019-11-13 — End: 2020-03-03

## 2019-11-13 NOTE — Patient Instructions (Addendum)
History of CHF, cardiomegaly and hypertension.  It has been months since I saw you.  You were unable to see cardiologist on  follow-up appointment scheduled for June.  Apparently last-minute cancellation by  specialist.  Will get CMP, BNP and chest x-ray today.  Make changes to medication regimen if necessary.  Went ahead and placed new referral to [facility] cardiologist upstairs on the third floor.  On  discussion this is is more convenient location for you as you live [place].  Hopefully they will accept the referral.  If not then want to get back in with your former cardiologist.  Recent ankle pain right side with bilateral toe pain.  Likely represents gout flare.  Continue allopurinol.  Will add colchicine 1 tablet twice daily for the next 4 days.  Presently hold Crestor due to medication interaction.  Also potential interaction with carvedilol.  On epic review could increase concentrations on colchicine.  So we will see how you are doing in 4 days and might only recommend 1 tab a day on day 5 or might advise you to hold colchicine if your pain has resolved.  I will get uric acid today and right ankle x-ray.  Follow-up in 7 days or as needed.

## 2019-11-13 NOTE — Addendum Note (Signed)
Addended by: Thelma Barge D on: 11/13/2019 02:52 PM   Modules accepted: Orders

## 2019-11-13 NOTE — Progress Notes (Signed)
Subjective:    Patient ID: Drew [name], male    DOB: February 15, 1960, 59 y.o.   MRN: [id number]  HPI  Pt in for follow up.  Pt states both his feel and ankles swollen. He is having intermittent severe pain which he states along the lines of gout type pain.. Pt is on on allopurinol 100 mg daily. Pt has good kidney function.  Pt pain recently has been mostly  rt ankle and base of great toes.     Pt has missed work since 10-07-2019.    Pt also has history of chf. Last visit was in summer.   Pt has not been taking torsemide as it is written. Pt states someone at specialist 2 tab in am and 1 tam at night.  Pt on coreg, entresto and diurtectic.  Back in March I put in referral. He states he did not attend last appointment. He was scheduled and he showed up but then told appointent was cancelled.  Last referral request note. Note   11 lb weight gain in past month with dyspnea. Pt is former pt of Dr. Antoine Poche. He was supposed to follow up in June 2020 but look like did not on review. Getting cxr, bnp and cmp. But would like quick appointment if possible within a week?   1. The left ventricle has severely reduced systolic function, with an  ejection fraction of 20-25%. The cavity size was severely dilated. There  is moderate concentric left ventricular hypertrophy. Left ventricular  diastolic Doppler parameters are  consistent with pseudonormalization. Elevated left atrial and left  ventricular end-diastolic pressures Left ventricular diffuse hypokinesis.     Pt short of breath when he walks. Pt has to sleep in recliner.   Review of Systems  Constitutional: Negative for chills, fatigue and fever.  Respiratory: Negative for cough, chest tightness, shortness of breath and wheezing.   Cardiovascular: Negative for chest pain and palpitations.  Gastrointestinal: Negative for abdominal pain, constipation, nausea and vomiting.  Musculoskeletal:       Pedal edema hx.   Skin: Negative  for rash.  Neurological: Negative for dizziness and light-headedness.  Hematological: Negative for adenopathy. Does not bruise/bleed easily.  Psychiatric/Behavioral: Negative for behavioral problems, confusion and suicidal ideas. The patient is not nervous/anxious.        Objective:   Physical Exam   General Mental Status- Alert. General Appearance- Not in acute distress.   Skin General: Color- Normal Color. Moisture- Normal Moisture.  Neck Carotid Arteries- Normal color. Moisture- Normal Moisture. No carotid bruits. No JVD.  Chest and Lung Exam Auscultation: Breath Sounds:-Normal.  Cardiovascular Auscultation:Rythm- Regular. Murmurs & Other Heart Sounds:Auscultation of the heart reveals- No Murmurs.  Abdomen Inspection:-Inspeection Normal. Palpation/Percussion:Note:No mass. Palpation and Percussion of the abdomen reveal- Non Tender, Non Distended + BS, no rebound or guarding.      Neurologic Cranial Nerve exam:- CN III-XII intact(No nystagmus), symmetric smile. Strength:- 5/5 equal and symmetric strength both upper and lower extremities.   Lower ext- calfs not swollen. 1+  edema  at best today.today. Negative homans sign.  Rt ankle- lateral malleolus mid warm and tender Rt great toe mild tender to palpation. Left foot- great toe mild tender    Assessment & Plan:  History of CHF, cardiomegaly and hypertension.  It has been months since I saw you.  You were unable to see cardiologist on  follow-up appointment scheduled for June.  Apparently last-minute cancellation by  specialist.  Will get CMP, BNP and chest x-ray today.  Make changes to medication regimen if necessary.  Went ahead and placed new referral to [facility] cardiologist upstairs on the third floor.  On  discussion this is is more convenient location for you as you live [place].  Hopefully they will accept the referral.  If not then want to get back in with your former cardiologist.  Recent ankle pain  right side with bilateral toe pain.  Likely represents gout flare.  Continue allopurinol.  Will add colchicine 1 tablet twice daily for the next 4 days.  Presently hold Crestor due to medication interaction.  Also potential interaction with carvedilol.  On epic review could increase concentrations on colchicine.  So we will see how you are doing in 4 days and might only recommend 1 tab a day on day 5 or might advise you to hold colchicine if your pain has resolved.  I will get uric acid today and right ankle x-ray.  Follow-up in 7 days or as needed.  Esperanza Richters, PA-C  Time spent with patient today was 40  minutes which consisted of chart review, discussing diagnosis, work up treatment and documentation.

## 2019-11-14 LAB — COMPREHENSIVE METABOLIC PANEL
AG Ratio: 1.6 (calc) (ref 1.0–2.5)
ALT: 15 U/L (ref 9–46)
AST: 13 U/L (ref 10–35)
Albumin: 4.4 g/dL (ref 3.6–5.1)
Alkaline phosphatase (APISO): 58 U/L (ref 35–144)
BUN: 19 mg/dL (ref 7–25)
CO2: 29 mmol/L (ref 20–32)
Calcium: 8.9 mg/dL (ref 8.6–10.3)
Chloride: 103 mmol/L (ref 98–110)
Creat: 1.26 mg/dL (ref 0.70–1.33)
Globulin: 2.8 g/dL (calc) (ref 1.9–3.7)
Glucose, Bld: 96 mg/dL (ref 65–99)
Potassium: 4.3 mmol/L (ref 3.5–5.3)
Sodium: 142 mmol/L (ref 135–146)
Total Bilirubin: 0.5 mg/dL (ref 0.2–1.2)
Total Protein: 7.2 g/dL (ref 6.1–8.1)

## 2019-11-14 LAB — URIC ACID: Uric Acid, Serum: 10.5 mg/dL — ABNORMAL HIGH (ref 4.0–8.0)

## 2019-11-14 LAB — BRAIN NATRIURETIC PEPTIDE: Brain Natriuretic Peptide: 8 pg/mL (ref <100)

## 2019-11-19 ENCOUNTER — Other Ambulatory Visit: Payer: Self-pay

## 2019-11-20 ENCOUNTER — Ambulatory Visit: Payer: Federal, State, Local not specified - PPO | Admitting: Medical

## 2019-11-20 ENCOUNTER — Ambulatory Visit: Payer: Federal, State, Local not specified - PPO | Admitting: Cardiology

## 2019-11-20 ENCOUNTER — Other Ambulatory Visit: Payer: Self-pay

## 2019-11-20 ENCOUNTER — Encounter: Payer: Self-pay | Admitting: Cardiology

## 2019-11-20 VITALS — BP 112/51 | HR 58 | Temp 98.7°F | Resp 12 | Ht 71.0 in | Wt 366.8 lb

## 2019-11-20 VITALS — BP 120/68 | HR 63 | Ht 70.0 in | Wt 366.0 lb

## 2019-11-20 DIAGNOSIS — I5020 Unspecified systolic (congestive) heart failure: Secondary | ICD-10-CM

## 2019-11-20 DIAGNOSIS — I10 Essential (primary) hypertension: Secondary | ICD-10-CM

## 2019-11-20 DIAGNOSIS — E66813 Obesity, class 3: Secondary | ICD-10-CM

## 2019-11-20 DIAGNOSIS — I428 Other cardiomyopathies: Secondary | ICD-10-CM

## 2019-11-20 DIAGNOSIS — M109 Gout, unspecified: Secondary | ICD-10-CM

## 2019-11-20 HISTORY — DX: Unspecified systolic (congestive) heart failure: I50.20

## 2019-11-20 HISTORY — DX: Other cardiomyopathies: I42.8

## 2019-11-20 MED ORDER — SPIRONOLACTONE 25 MG PO TABS
12.5000 mg | ORAL_TABLET | Freq: Every day | ORAL | 1 refills | Status: DC
Start: 2019-11-20 — End: 2020-03-09

## 2019-11-20 MED ORDER — ENTRESTO 24-26 MG PO TABS: 1.0000 | ORAL_TABLET | Freq: Two times a day (BID) | ORAL | 3 refills | Status: DC

## 2019-11-20 MED ORDER — CARVEDILOL 12.5 MG PO TABS: 12.5000 mg | ORAL_TABLET | Freq: Two times a day (BID) | ORAL | 3 refills | Status: DC

## 2019-11-20 NOTE — Progress Notes (Signed)
Subjective:    Patient ID: Drew [name], male    DOB: 27-Dec-1960, 59 y.o.   MRN: [id number]  HPI  Pt in for follow up on rt ankle pain and foot pain. Pt uric acid was elevated. He states ankle and foot pain resolved with colchicine. No pain at all.  Pt has hx of chf and gout. His cxr was stable. Cmp showed normal k level and cr was normal. Also bnp was not elevated. Pt has cardiologist appointment today.     Review of Systems  Constitutional: Negative for chills and fatigue.  Respiratory: Negative for cough, chest tightness, shortness of breath and wheezing.   Cardiovascular: Negative for chest pain and palpitations.  Gastrointestinal: Negative for abdominal pain.  Musculoskeletal: Negative for back pain.  Neurological: Negative for dizziness, tremors, speech difficulty and headaches.  Hematological: Negative for adenopathy. Does not bruise/bleed easily.  Psychiatric/Behavioral: Negative for behavioral problems and confusion.    Past Medical History:  Diagnosis Date  . Allergy   . Dilated cardiomyopathy (HCC)   . HTN (hypertension)   . Hyperlipidemia 05/24/2018     Social History   Socioeconomic History  . Marital status: Married    Spouse name: Not on file  . Number of children: Not on file  . Years of education: Not on file  . Highest education level: Not on file  Occupational History  . Not on file  Tobacco Use  . Smoking status: Former Smoker    Packs/day: 1.00    Years: 20.00    Pack years: 20.00    Types: Cigarettes    Quit date: 05/23/2018    Years since quitting: 1.4  . Smokeless tobacco: Never Used  Substance and Sexual Activity  . Alcohol use: Yes    Alcohol/week: 0.0 standard drinks    Comment: 6 pack a week spread out over weekend  . Drug use: No  . Sexual activity: Yes  Other Topics Concern  . Not on file  Social History Narrative   [employer].   Married lives with wife, mother in law and daughter.  7 children.     Social Determinants of Health    Financial Resource Strain:   . Difficulty of Paying Living Expenses: Not on file  Food Insecurity:   . Worried About Programme researcher, broadcasting/film/video in the Last Year: Not on file  . Ran Out of Food in the Last Year: Not on file  Transportation Needs:   . Lack of Transportation (Medical): Not on file  . Lack of Transportation (Non-Medical): Not on file  Physical Activity:   . Days of Exercise per Week: Not on file  . Minutes of Exercise per Session: Not on file  Stress:   . Feeling of Stress : Not on file  Social Connections:   . Frequency of Communication with Friends and Family: Not on file  . Frequency of Social Gatherings with Friends and Family: Not on file  . Attends Religious Services: Not on file  . Active Member of Clubs or Organizations: Not on file  . Attends Banker Meetings: Not on file  . Marital Status: Not on file  Intimate Partner Violence:   . Fear of Current or Ex-Partner: Not on file  . Emotionally Abused: Not on file  . Physically Abused: Not on file  . Sexually Abused: Not on file    Past Surgical History:  Procedure Laterality Date  . KNEE ARTHROSCOPY  2009  . RIGHT/LEFT HEART CATH AND CORONARY ANGIOGRAPHY  N/A 05/26/2018   Procedure: RIGHT/LEFT HEART CATH AND CORONARY ANGIOGRAPHY;  Surgeon: Lyn Records, MD;  Location: [facility] INVASIVE CV LAB;  Service: Cardiovascular;  Laterality: N/A;  . THROAT SURGERY  2005    Family History  Problem Relation Age of Onset  . Hearing loss Mother   . Cancer Father   . Heart failure Other   . Heart failure Paternal Grandfather     Allergies  Allergen Reactions  . Tetracyclines & Related Hives and Itching    Current Outpatient Medications on File Prior to Visit  Medication Sig Dispense Refill  . albuterol (PROVENTIL HFA;VENTOLIN HFA) 108 (90 Base) MCG/ACT inhaler Inhale 2 puffs into the lungs every 6 (six) hours as needed for wheezing or shortness of breath. 1 Inhaler 2  . allopurinol (ZYLOPRIM) 100 MG tablet  Take 1 tablet (100 mg total) by mouth daily. 30 tablet 6  . carvedilol (COREG) 12.5 MG tablet TAKE 1 TABLET(12.5 MG) BY MOUTH TWICE DAILY WITH A MEAL 60 tablet 3  . colchicine 0.6 MG tablet Take 1 tablet (0.6 mg total) by mouth 2 (two) times daily. 60 tablet 0  . ENTRESTO 24-26 MG TAKE 1 TABLET BY MOUTH TWICE DAILY 90 tablet 3  . rosuvastatin (CRESTOR) 10 MG tablet TAKE 1 TABLET BY MOUTH DAILY 90 tablet 2  . torsemide (DEMADEX) 20 MG tablet Take 80 mg (4 tablets) in the morning and 20 mg (1 tablets) in the evening 540 tablet 3  . potassium chloride SA (KLOR-CON) 20 MEQ tablet Take 1 tablet (20 mEq total) by mouth 2 (two) times daily. 60 tablet 3   No current facility-administered medications on file prior to visit.    BP (!) 112/51 (BP Location: Left Arm, Cuff Size: Large)   Pulse (!) 58   Temp 98.7 F (37.1 C) (Oral)   Resp 12   Ht 5\' 11"  (1.803 m)   Wt (!) 366 lb 12.8 oz (166.4 kg)   SpO2 98%   BMI 51.16 kg/m       Objective:   Physical Exam  General Mental Status- Alert. General Appearance- Not in acute distress.   Skin General: Color- Normal Color. Moisture- Normal Moisture.  Neck Carotid Arteries- Normal color. Moisture- Normal Moisture. No carotid bruits. No JVD.  Chest and Lung Exam Auscultation: Breath Sounds:-Normal.  Cardiovascular Auscultation:Rythm- Regular. Murmurs & Other Heart Sounds:Auscultation of the heart reveals- No Murmurs.   Neurologic Cranial Nerve exam:- CN III-XII intact(No nystagmus), symmetric smile. Strength:- 5/5 equal and symmetric strength both upper and lower extremities.  Rt ankle/foot- no pain on palpation.     Assessment & Plan:  Glad to hear your ankle and foot pain resolved with addition of colchicine daily. Recommend stay on colchicine twice daily and low dose allopurinol. Will repeat uric acid and cmp with gfr in 3 weeks(only lab visit).   Keep cardiology appointment for chf follow up.   Follow up date to be determined  after future lab review.  , PA-C

## 2019-11-20 NOTE — Progress Notes (Signed)
Cardiology Office Note:    Date:  11/20/2019   ID:  Drew [name], DOB Jan 09, 1961, MRN [id number]  PCP:  Esperanza Richters, PA-C  Cardiologist:  Rollene Rotunda, MD  Electrophysiologist:  None   Referring MD: Esperanza Richters, PA-C   " I am doing fine  History of Present Illness:    Drew [name] is a 59 y.o. male with a hx of nonischemic cardiomyopathy EF on echo 20 to 25% in March 2021, heart failure with reduced ejection fraction, tobacco use, hyperlipidemia and hypertension  The patient was last seen by Dr. Antoine Poche on Jun 02, 2018 at which time he had been started on guideline directed medicines and was asked to follow-up in a month.  Based on chart review it appears that the patient had not follow-up at all.  He is here today for follow-up visit.  Here for no complaints at this time.  Tells me that he is working on his diet.  He has started using his stationary bike at home and has plans to change his lifestyle.   Past Medical History:  Diagnosis Date  . Allergy   . Dilated cardiomyopathy (HCC)   . HTN (hypertension)   . Hyperlipidemia 05/24/2018    Past Surgical History:  Procedure Laterality Date  . KNEE ARTHROSCOPY  2009  . RIGHT/LEFT HEART CATH AND CORONARY ANGIOGRAPHY N/A 05/26/2018   Procedure: RIGHT/LEFT HEART CATH AND CORONARY ANGIOGRAPHY;  Surgeon: Lyn Records, MD;  Location: [facility] INVASIVE CV LAB;  Service: Cardiovascular;  Laterality: N/A;  . THROAT SURGERY  2005    Current Medications: Current Meds  Medication Sig  . albuterol (PROVENTIL HFA;VENTOLIN HFA) 108 (90 Base) MCG/ACT inhaler Inhale 2 puffs into the lungs every 6 (six) hours as needed for wheezing or shortness of breath.  . allopurinol (ZYLOPRIM) 100 MG tablet Take 1 tablet (100 mg total) by mouth daily.  . carvedilol (COREG) 12.5 MG tablet TAKE 1 TABLET(12.5 MG) BY MOUTH TWICE DAILY WITH A MEAL  . colchicine 0.6 MG tablet Take 1 tablet (0.6 mg total) by mouth 2 (two) times daily.  Marland Kitchen ENTRESTO 24-26 MG  TAKE 1 TABLET BY MOUTH TWICE DAILY  . potassium chloride SA (KLOR-CON) 20 MEQ tablet Take 1 tablet (20 mEq total) by mouth 2 (two) times daily.  Marland Kitchen torsemide (DEMADEX) 20 MG tablet Take 80 mg (4 tablets) in the morning and 20 mg (1 tablets) in the evening     Allergies:   Tetracyclines & related   Social History   Socioeconomic History  . Marital status: Married    Spouse name: Not on file  . Number of children: Not on file  . Years of education: Not on file  . Highest education level: Not on file  Occupational History  . Not on file  Tobacco Use  . Smoking status: Former Smoker    Packs/day: 1.00    Years: 20.00    Pack years: 20.00    Types: Cigarettes    Quit date: 05/23/2018    Years since quitting: 1.4  . Smokeless tobacco: Never Used  Substance and Sexual Activity  . Alcohol use: Yes    Alcohol/week: 0.0 standard drinks    Comment: 6 pack a week spread out over weekend  . Drug use: No  . Sexual activity: Yes  Other Topics Concern  . Not on file  Social History Narrative   [employer].   Married lives with wife, mother in law and daughter.  7 children.     Social  Determinants of Health   Financial Resource Strain:   . Difficulty of Paying Living Expenses: Not on file  Food Insecurity:   . Worried About Programme researcher, broadcasting/film/video in the Last Year: Not on file  . Ran Out of Food in the Last Year: Not on file  Transportation Needs:   . Lack of Transportation (Medical): Not on file  . Lack of Transportation (Non-Medical): Not on file  Physical Activity:   . Days of Exercise per Week: Not on file  . Minutes of Exercise per Session: Not on file  Stress:   . Feeling of Stress : Not on file  Social Connections:   . Frequency of Communication with Friends and Family: Not on file  . Frequency of Social Gatherings with Friends and Family: Not on file  . Attends Religious Services: Not on file  . Active Member of Clubs or Organizations: Not on file  . Attends Banker  Meetings: Not on file  . Marital Status: Not on file     Family History: The patient's family history includes Cancer in his father; Hearing loss in his mother; Heart failure in his paternal grandfather and another family member.  ROS:   Review of Systems  Constitution: Negative for decreased appetite, fever and weight gain.  HENT: Negative for congestion, ear discharge, hoarse voice and sore throat.   Eyes: Negative for discharge, redness, vision loss in right eye and visual halos.  Cardiovascular: Negative for chest pain, dyspnea on exertion, leg swelling, orthopnea and palpitations.  Respiratory: Negative for cough, hemoptysis, shortness of breath and snoring.   Endocrine: Negative for heat intolerance and polyphagia.  Hematologic/Lymphatic: Negative for bleeding problem. Does not bruise/bleed easily.  Skin: Negative for flushing, nail changes, rash and suspicious lesions.  Musculoskeletal: Negative for arthritis, joint pain, muscle cramps, myalgias, neck pain and stiffness.  Gastrointestinal: Negative for abdominal pain, bowel incontinence, diarrhea and excessive appetite.  Genitourinary: Negative for decreased libido, genital sores and incomplete emptying.  Neurological: Negative for brief paralysis, focal weakness, headaches and loss of balance.  Psychiatric/Behavioral: Negative for altered mental status, depression and suicidal ideas.  Allergic/Immunologic: Negative for HIV exposure and persistent infections.    EKGs/Labs/Other Studies Reviewed:    The following studies were reviewed today:   EKG:  The ekg ordered today demonstrates   TTE IMPRESSIONS    1. Left ventricular ejection fraction, by estimation, is 20 to 25%. The  left ventricle has severely decreased function. The left ventricle  demonstrates global hypokinesis. The left ventricular internal cavity size  was severely dilated. There is mild  concentric left ventricular hypertrophy. Left ventricular diastolic    parameters are consistent with Grade I diastolic dysfunction (impaired  relaxation). Elevated left atrial pressure. The average left ventricular  global longitudinal strain is -16.7 %.  2. Right ventricular systolic function is normal. The right ventricular  size is normal. There is normal pulmonary artery systolic pressure.  3. Left atrial size was mildly dilated.  4. Right atrial size was mildly dilated.  5. The mitral valve is normal in structure and function. Mild mitral  valve regurgitation. No evidence of mitral stenosis.  6. The aortic valve is normal in structure and function. Aortic valve  regurgitation is not visualized. No aortic stenosis is present.  7. The inferior vena cava is normal in size with greater than 50%  respiratory variability, suggesting right atrial pressure of 3 mmHg  Recent Labs: 06/30/2019: Hemoglobin 11.6; Platelets 226.0; Pro B Natriuretic peptide (BNP)  45.0 11/13/2019: ALT 15; Brain Natriuretic Peptide 8; BUN 19; Creat 1.26; Potassium 4.3; Sodium 142  Recent Lipid Panel    Component Value Date/Time   CHOL 152 05/25/2018 0343   TRIG 69 05/25/2018 0343   HDL 36 (L) 05/25/2018 0343   CHOLHDL 4.2 05/25/2018 0343   VLDL 14 05/25/2018 0343   LDLCALC 102 (H) 05/25/2018 0343    Physical Exam:    VS:  BP 120/68 (BP Location: Right Arm)   Pulse 63   Ht 5\' 10"  (1.778 m)   Wt (!) 366 lb (166 kg)   SpO2 97%   BMI 52.52 kg/m     Wt Readings from Last 3 Encounters:  11/20/19 (!) 366 lb (166 kg)  11/20/19 (!) 366 lb 12.8 oz (166.4 kg)  11/13/19 (!) 371 lb 12.8 oz (168.6 kg)     GEN: Well nourished, well developed in no acute distress HEENT: Normal NECK: No JVD; No carotid bruits LYMPHATICS: No lymphadenopathy CARDIAC: S1S2 noted,RRR, no murmurs, rubs, gallops RESPIRATORY:  Clear to auscultation without rales, wheezing or rhonchi  ABDOMEN: Soft, non-tender, non-distended, +bowel sounds, no guarding. EXTREMITIES: No edema, No cyanosis, no  clubbing MUSCULOSKELETAL:  No deformity  SKIN: Warm and dry NEUROLOGIC:  Alert and oriented x 3, non-focal PSYCHIATRIC:  Normal affect, good insight  ASSESSMENT:    1. Essential hypertension   2. Obesity, Class III, BMI 40-49.9 (morbid obesity) (HCC)   3. Nonischemic cardiomyopathy (HCC)   4. HFrEF (heart failure with reduced ejection fraction) (HCC)    PLAN:     1.  He is on carvedilol 12.5 mg twice daily as well as Entresto 24-26 mg twice daily.  What I like to do is add Aldactone to the patient's regimen.  I will start Aldactone 12.5 mg daily.  I like to see him back in 1 month and if tolerating this medication I will either add Farxiga then starts to titrate up his medication.  In the meantime I have discussed with the patient if he would see our advanced heart failure clinic and he is in agreement so I will send referral at this time.  We talked about weight loss processes.  I recommended our healthy weight and wellness program he has declined this for now, he prefers to move along with his diet and exercises as he has seen some weight loss. I reviewed his recent lab which she had on October 29 with his PCP.  He plans to get labs again in 3 weeks from my going to add additional blood work.  We will review his kidney function and potassium at that time.  He is on potassium supplement which have been stopped since I am starting him on Aldactone.   The patient is in agreement with the above plan. The patient left the office in stable condition.  The patient will follow up in 1 month or sooner if needed.   Medication Adjustments/Labs and Tests Ordered: Current medicines are reviewed at length with the patient today.  Concerns regarding medicines are outlined above.  No orders of the defined types were placed in this encounter.  No orders of the defined types were placed in this encounter.   There are no Patient Instructions on file for this visit.   Adopting a Healthy  Lifestyle.  Know what a healthy weight is for you (roughly BMI <25) and aim to maintain this   Aim for 7+ servings of fruits and vegetables daily   65-80+ fluid ounces of water  or unsweet tea for healthy kidneys   Limit to max 1 drink of alcohol per day; avoid smoking/tobacco   Limit animal fats in diet for cholesterol and heart health - choose grass fed whenever available   Avoid highly processed foods, and foods high in saturated/trans fats   Aim for low stress - take time to unwind and care for your mental health   Aim for 150 min of moderate intensity exercise weekly for heart health, and weights twice weekly for bone health   Aim for 7-9 hours of sleep daily   When it comes to diets, agreement about the perfect plan isnt easy to find, even among the experts. Experts at the [facility] [employer] developed an idea known as the Healthy Eating Plate. Just imagine a plate divided into logical, healthy portions.   The emphasis is on diet quality:   Load up on vegetables and fruits - one-half of your plate: Aim for color and variety, and remember that potatoes dont count.   Go for whole grains - one-quarter of your plate: Whole wheat, barley, wheat berries, quinoa, oats, brown rice, and foods made with them. If you want pasta, go with whole wheat pasta.   Protein power - one-quarter of your plate: Fish, chicken, beans, and nuts are all healthy, versatile protein sources. Limit red meat.   The diet, however, does go beyond the plate, offering a few other suggestions.   Use healthy plant oils, such as olive, canola, soy, corn, sunflower and peanut. Check the labels, and avoid partially hydrogenated oil, which have unhealthy trans fats.   If youre thirsty, drink water. Coffee and tea are good in moderation, but skip sugary drinks and limit milk and dairy products to one or two daily servings.   The type of carbohydrate in the diet is more important than the amount. Some  sources of carbohydrates, such as vegetables, fruits, whole grains, and beans-are healthier than others.   Finally, stay active  Signed, Thomasene Ripple, DO  11/20/2019 1:27 PM    [facility]

## 2019-11-20 NOTE — Patient Instructions (Signed)
Glad to hear your ankle and foot pain resolved with addition of colchicine daily. Recommend stay on colchicine twice daily and low dose allopurinol. Will repeat uric acid and cmp with gfr in 3 weeks(only lab visit).   Keep cardiology appointment for chf follow up.   Follow up date to be determined after future lab review.

## 2019-11-20 NOTE — Patient Instructions (Signed)
Medication Instructions:  Your physician has recommended you make the following change in your medication:  -- STOP Potassium Chloride -- START Spironolactone (Aldactone) 12.5 mg - Take 0.5 tablet (12.5 mg) by mouth daily  *If you need a refill on your cardiac medications before your next appointment, please call your pharmacy*  Follow-Up: At [facility], you and your health needs are our priority.  As part of our continuing mission to provide you with exceptional heart care, we have created designated Provider Care Teams.  These Care Teams include your primary Cardiologist (physician) and Advanced Practice Providers (APPs -  Physician Assistants and Nurse Practitioners) who all work together to provide you with the care you need, when you need it.  We recommend signing up for the patient portal called "MyChart".  Sign up information is provided on this After Visit Summary.  MyChart is used to connect with patients for Virtual Visits (Telemedicine).  Patients are able to view lab/test results, encounter notes, upcoming appointments, etc.  Non-urgent messages can be sent to your provider as well.   To learn more about what you can do with MyChart, go to [email or web address].    Your next appointment:   Your physician recommends that you schedule a follow-up appointment in: 1 MONTH with Dr. Servando Salina  The format for your next appointment:   In Person with Thomasene Ripple, DO  -- A referral has been placed for you to be evaluated by the Advanced Heart Failure [facility] at the [facility]. Someone will contact you for scheduling --

## 2019-12-09 NOTE — Addendum Note (Signed)
Addended by: Mervin Kung A on: 12/09/2019 03:36 PM   Modules accepted: Orders

## 2019-12-14 DIAGNOSIS — T7840XA Allergy, unspecified, initial encounter: Secondary | ICD-10-CM | POA: Insufficient documentation

## 2019-12-14 DIAGNOSIS — I420 Dilated cardiomyopathy: Secondary | ICD-10-CM | POA: Insufficient documentation

## 2019-12-14 DIAGNOSIS — I10 Essential (primary) hypertension: Secondary | ICD-10-CM | POA: Insufficient documentation

## 2019-12-15 ENCOUNTER — Encounter: Payer: Self-pay | Admitting: Cardiology

## 2019-12-15 ENCOUNTER — Other Ambulatory Visit: Payer: Self-pay

## 2019-12-15 ENCOUNTER — Ambulatory Visit: Payer: Federal, State, Local not specified - PPO | Admitting: Cardiology

## 2019-12-15 ENCOUNTER — Other Ambulatory Visit (INDEPENDENT_AMBULATORY_CARE_PROVIDER_SITE_OTHER): Payer: Federal, State, Local not specified - PPO

## 2019-12-15 VITALS — BP 118/76 | HR 78 | Ht 70.0 in | Wt 371.0 lb

## 2019-12-15 DIAGNOSIS — I10 Essential (primary) hypertension: Secondary | ICD-10-CM

## 2019-12-15 DIAGNOSIS — I428 Other cardiomyopathies: Secondary | ICD-10-CM

## 2019-12-15 DIAGNOSIS — M109 Gout, unspecified: Secondary | ICD-10-CM | POA: Diagnosis not present

## 2019-12-15 DIAGNOSIS — E66813 Obesity, class 3: Secondary | ICD-10-CM

## 2019-12-15 DIAGNOSIS — I5020 Unspecified systolic (congestive) heart failure: Secondary | ICD-10-CM | POA: Diagnosis not present

## 2019-12-15 DIAGNOSIS — I420 Dilated cardiomyopathy: Secondary | ICD-10-CM | POA: Diagnosis not present

## 2019-12-15 LAB — COMPREHENSIVE METABOLIC PANEL
ALT: 17 U/L (ref 0–53)
AST: 15 U/L (ref 0–37)
Albumin: 4.4 g/dL (ref 3.5–5.2)
Alkaline Phosphatase: 52 U/L (ref 39–117)
BUN: 14 mg/dL (ref 6–23)
CO2: 31 mEq/L (ref 19–32)
Calcium: 8.7 mg/dL (ref 8.4–10.5)
Chloride: 103 mEq/L (ref 96–112)
Creatinine, Ser: 1.04 mg/dL (ref 0.40–1.50)
GFR: 79.86 mL/min (ref >60.00)
Glucose, Bld: 106 mg/dL — ABNORMAL HIGH (ref 70–99)
Potassium: 3.8 mEq/L (ref 3.5–5.1)
Sodium: 141 mEq/L (ref 135–145)
Total Bilirubin: 0.4 mg/dL (ref 0.2–1.2)
Total Protein: 7.2 g/dL (ref 6.0–8.3)

## 2019-12-15 LAB — URIC ACID: Uric Acid, Serum: 8.6 mg/dL — ABNORMAL HIGH (ref 4.0–7.8)

## 2019-12-15 MED ORDER — DAPAGLIFLOZIN PROPANEDIOL 10 MG PO TABS
10.0000 mg | ORAL_TABLET | Freq: Every day | ORAL | 3 refills | Status: DC
Start: 2019-12-15 — End: 2020-02-05

## 2019-12-15 NOTE — Patient Instructions (Signed)
Medication Instructions:  Your physician has recommended you make the following change in your medication:  1) START taking Farxiga 10 mg daily *If you need a refill on your cardiac medications before your next appointment, please call your pharmacy*  Follow-Up: At [facility], you and your health needs are our priority.  As part of our continuing mission to provide you with exceptional heart care, we have created designated Provider Care Teams.  These Care Teams include your primary Cardiologist (physician) and Advanced Practice Providers (APPs -  Physician Assistants and Nurse Practitioners) who all work together to provide you with the care you need, when you need it.  Your next appointment:   8 week(s)  The format for your next appointment:   In Person  Provider:   Thomasene Ripple, DO

## 2019-12-15 NOTE — Progress Notes (Signed)
Cardiology Office Note:    Date:  12/15/2019   ID:  Drew [name], DOB [birth year], MRN [id number]  PCP:  Drew Richters, PA-C  Cardiologist:  Rollene Rotunda, MD  Electrophysiologist:  None   Referring MD: Drew [name]   I am doing fine  History of Present Illness:    Drew [name] is a 59 y.o. male with a hx of nonischemic cardiomyopathy EF on echo 20 to 25% in March 2021, heart failure with reduced ejection fraction, tobacco use, hyperlipidemia and hypertension.  At his last visit I added Aldactone to the patient's medication regimen.  He is here today for follow-up visit.  He tells me that he has had good result with the Aldactone has not had any side effects.He has had no hospitalization or ER visits.  He has been doing well from a cardiovascular standpoint he tells me.  Past Medical History:  Diagnosis Date  . Allergy   . Dilated cardiomyopathy (HCC)   . HTN (hypertension)   . Hyperlipidemia 05/24/2018    Past Surgical History:  Procedure Laterality Date  . KNEE ARTHROSCOPY  2009  . RIGHT/LEFT HEART CATH AND CORONARY ANGIOGRAPHY N/A 05/26/2018   Procedure: RIGHT/LEFT HEART CATH AND CORONARY ANGIOGRAPHY;  Surgeon: Drew Records, MD;  Location: [facility] INVASIVE CV LAB;  Service: Cardiovascular;  Laterality: N/A;  . THROAT SURGERY  2005    Current Medications: Current Meds  Medication Sig  . albuterol (PROVENTIL HFA;VENTOLIN HFA) 108 (90 Base) MCG/ACT inhaler Inhale 2 puffs into the lungs every 6 (six) hours as needed for wheezing or shortness of breath.  . allopurinol (ZYLOPRIM) 100 MG tablet Take 1 tablet (100 mg total) by mouth daily.  . carvedilol (COREG) 12.5 MG tablet Take 1 tablet (12.5 mg total) by mouth 2 (two) times daily with a meal.  . colchicine 0.6 MG tablet Take 1 tablet (0.6 mg total) by mouth 2 (two) times daily.  . sacubitril-valsartan (ENTRESTO) 24-26 MG Take 1 tablet by mouth 2 (two) times daily.  Marland Kitchen spironolactone (ALDACTONE) 25 MG tablet Take 0.5  tablets (12.5 mg total) by mouth daily.  Marland Kitchen torsemide (DEMADEX) 20 MG tablet Take 80 mg (4 tablets) in the morning and 20 mg (1 tablets) in the evening     Allergies:   Tetracyclines & related   Social History   Socioeconomic History  . Marital status: Married    Spouse name: Not on file  . Number of children: Not on file  . Years of education: Not on file  . Highest education level: Not on file  Occupational History  . Not on file  Tobacco Use  . Smoking status: Former Smoker    Packs/day: 1.00    Years: 20.00    Pack years: 20.00    Types: Cigarettes    Quit date: 05/23/2018    Years since quitting: 1.5  . Smokeless tobacco: Never Used  Substance and Sexual Activity  . Alcohol use: Yes    Alcohol/week: 0.0 standard drinks    Comment: 6 pack a week spread out over weekend  . Drug use: No  . Sexual activity: Yes  Other Topics Concern  . Not on file  Social History Narrative   [employer].   Married lives with wife, mother in law and daughter.  7 children.     Social Determinants of Health   Financial Resource Strain:   . Difficulty of Paying Living Expenses: Not on file  Food Insecurity:   . Worried About [employer] of  Food in the Last Year: Not on file  . Ran Out of Food in the Last Year: Not on file  Transportation Needs:   . Lack of Transportation (Medical): Not on file  . Lack of Transportation (Non-Medical): Not on file  Physical Activity:   . Days of Exercise per Week: Not on file  . Minutes of Exercise per Session: Not on file  Stress:   . Feeling of Stress : Not on file  Social Connections:   . Frequency of Communication with Friends and Family: Not on file  . Frequency of Social Gatherings with Friends and Family: Not on file  . Attends Religious Services: Not on file  . Active Member of Clubs or Organizations: Not on file  . Attends Banker Meetings: Not on file  . Marital Status: Not on file     Family History: The patient's family history  includes Cancer in his father; Hearing loss in his mother; Heart failure in his paternal grandfather and another family member.  ROS:   Review of Systems  Constitution: Negative for decreased appetite, fever and weight gain.  HENT: Negative for congestion, ear discharge, hoarse voice and sore throat.   Eyes: Negative for discharge, redness, vision loss in right eye and visual halos.  Cardiovascular: Negative for chest pain, dyspnea on exertion, leg swelling, orthopnea and palpitations.  Respiratory: Negative for cough, hemoptysis, shortness of breath and snoring.   Endocrine: Negative for heat intolerance and polyphagia.  Hematologic/Lymphatic: Negative for bleeding problem. Does not bruise/bleed easily.  Skin: Negative for flushing, nail changes, rash and suspicious lesions.  Musculoskeletal: Negative for arthritis, joint pain, muscle cramps, myalgias, neck pain and stiffness.  Gastrointestinal: Negative for abdominal pain, bowel incontinence, diarrhea and excessive appetite.  Genitourinary: Negative for decreased libido, genital sores and incomplete emptying.  Neurological: Negative for brief paralysis, focal weakness, headaches and loss of balance.  Psychiatric/Behavioral: Negative for altered mental status, depression and suicidal ideas.  Allergic/Immunologic: Negative for HIV exposure and persistent infections.    EKGs/Labs/Other Studies Reviewed:    The following studies were reviewed today:   EKG: None today  TTE IMPRESSIONS  1. Left ventricular ejection fraction, by estimation, is 20 to 25%. The  left ventricle has severely decreased function. The left ventricle  demonstrates global hypokinesis. The left ventricular internal cavity size  was severely dilated. There is mild  concentric left ventricular hypertrophy. Left ventricular diastolic  parameters are consistent with Grade I diastolic dysfunction (impaired  relaxation). Elevated left atrial pressure. The average left  ventricular  global longitudinal strain is -16.7 %.  2. Right ventricular systolic function is normal. The right ventricular  size is normal. There is normal pulmonary artery systolic pressure.  3. Left atrial size was mildly dilated.  4. Right atrial size was mildly dilated.  5. The mitral valve is normal in structure and function. Mild mitral  valve regurgitation. No evidence of mitral stenosis.  6. The aortic valve is normal in structure and function. Aortic valve  regurgitation is not visualized. No aortic stenosis is present.  7. The inferior vena cava is normal in size with greater than 50%  respiratory variability, suggesting right atrial pressure of 3 mmHg   Recent Labs: 06/30/2019: Hemoglobin 11.6; Platelets 226.0; Pro B Natriuretic peptide (BNP) 45.0 11/13/2019: ALT 15; Brain Natriuretic Peptide 8; BUN 19; Creat 1.26; Potassium 4.3; Sodium 142  Recent Lipid Panel    Component Value Date/Time   CHOL 152 05/25/2018 0343   TRIG 69 05/25/2018  0343   HDL 36 (L) 05/25/2018 0343   CHOLHDL 4.2 05/25/2018 0343   VLDL 14 05/25/2018 0343   LDLCALC 102 (H) 05/25/2018 0343    Physical Exam:    VS:  BP 118/76   Pulse 78   Ht 5\' 10"  (1.778 m)   Wt (!) 371 lb (168.3 kg)   SpO2 99%   BMI 53.23 kg/m     Wt Readings from Last 3 Encounters:  12/15/19 (!) 371 lb (168.3 kg)  11/20/19 (!) 366 lb (166 kg)  11/20/19 (!) 366 lb 12.8 oz (166.4 kg)     GEN: Well nourished, well developed in no acute distress HEENT: Normal NECK: No JVD; No carotid bruits LYMPHATICS: No lymphadenopathy CARDIAC: S1S2 noted,RRR, no murmurs, rubs, gallops RESPIRATORY:  Clear to auscultation without rales, wheezing or rhonchi  ABDOMEN: Soft, non-tender, non-distended, +bowel sounds, no guarding. EXTREMITIES: No edema, No cyanosis, no clubbing MUSCULOSKELETAL:  No deformity  SKIN: Warm and dry NEUROLOGIC:  Alert and oriented x 3, non-focal PSYCHIATRIC:  Normal affect, good insight  ASSESSMENT:      1. Essential hypertension   2. Nonischemic cardiomyopathy (HCC)   3. HFrEF (heart failure with reduced ejection fraction) (HCC)   4. Dilated cardiomyopathy (HCC)   5. Primary hypertension   6. Obesity, Class III, BMI 40-49.9 (morbid obesity) (HCC)    PLAN:      We will continue patient on his carvedilol 12.5 mg twice daily, Entresto 24-26 mg grams twice daily, Aldactone 12.5 mg daily.  I will add 13/05/21 to his medication regimen.  I plan to see the patient in 8 weeks.  He is euvolemic with no evidence of heart failure exacerbation.  He had blood work this morning done with his PCP office encounter follow this lab results.  His blood pressure is acceptable in the office today. The patient understands the need to lose weight with diet and exercise. We have discussed specific strategies for this.  The patient is in agreement with the above plan. The patient left the office in stable condition.  The patient will follow up in   Medication Adjustments/Labs and Tests Ordered: Current medicines are reviewed at length with the patient today.  Concerns regarding medicines are outlined above.  No orders of the defined types were placed in this encounter.  Meds ordered this encounter  Medications  . dapagliflozin propanediol (FARXIGA) 10 MG TABS tablet    Sig: Take 1 tablet (10 mg total) by mouth daily before breakfast.    Dispense:  90 tablet    Refill:  3    Patient Instructions  Medication Instructions:  Your physician has recommended you make the following change in your medication:  1) START taking Farxiga 10 mg daily *If you need a refill on your cardiac medications before your next appointment, please call your pharmacy*  Follow-Up: At [facility], you and your health needs are our priority.  As part of our continuing mission to provide you with exceptional heart care, we have created designated Provider Care Teams.  These Care Teams include your primary Cardiologist  (physician) and Advanced Practice Providers (APPs -  Physician Assistants and Nurse Practitioners) who all work together to provide you with the care you need, when you need it.  Your next appointment:   8 week(s)  The format for your next appointment:   In Person  Provider:   [facility], DO      Adopting a Healthy Lifestyle.  Know what a healthy weight is  for you (roughly BMI <25) and aim to maintain this   Aim for 7+ servings of fruits and vegetables daily   65-80+ fluid ounces of water or unsweet tea for healthy kidneys   Limit to max 1 drink of alcohol per day; avoid smoking/tobacco   Limit animal fats in diet for cholesterol and heart health - choose grass fed whenever available   Avoid highly processed foods, and foods high in saturated/trans fats   Aim for low stress - take time to unwind and care for your mental health   Aim for 150 min of moderate intensity exercise weekly for heart health, and weights twice weekly for bone health   Aim for 7-9 hours of sleep daily   When it comes to diets, agreement about the perfect plan isnt easy to find, even among the experts. Experts at the [facility] [employer] developed an idea known as the Healthy Eating Plate. Just imagine a plate divided into logical, healthy portions.   The emphasis is on diet quality:   Load up on vegetables and fruits - one-half of your plate: Aim for color and variety, and remember that potatoes dont count.   Go for whole grains - one-quarter of your plate: Whole wheat, barley, wheat berries, quinoa, oats, brown rice, and foods made with them. If you want pasta, go with whole wheat pasta.   Protein power - one-quarter of your plate: Fish, chicken, beans, and nuts are all healthy, versatile protein sources. Limit red meat.   The diet, however, does go beyond the plate, offering a few other suggestions.   Use healthy plant oils, such as olive, canola, soy, corn, sunflower and peanut.  Check the labels, and avoid partially hydrogenated oil, which have unhealthy trans fats.   If youre thirsty, drink water. Coffee and tea are good in moderation, but skip sugary drinks and limit milk and dairy products to one or two daily servings.   The type of carbohydrate in the diet is more important than the amount. Some sources of carbohydrates, such as vegetables, fruits, whole grains, and beans-are healthier than others.   Finally, stay active  Signed, Thomasene Ripple, DO  12/15/2019 10:49 AM    [facility]

## 2019-12-24 ENCOUNTER — Other Ambulatory Visit: Payer: Self-pay | Admitting: Adult Health

## 2020-01-11 ENCOUNTER — Other Ambulatory Visit: Payer: Self-pay | Admitting: Cardiology

## 2020-02-05 ENCOUNTER — Encounter: Payer: Self-pay | Admitting: Cardiology

## 2020-02-05 ENCOUNTER — Other Ambulatory Visit: Payer: Self-pay

## 2020-02-05 ENCOUNTER — Ambulatory Visit: Payer: Federal, State, Local not specified - PPO | Admitting: Cardiology

## 2020-02-05 VITALS — BP 128/66 | HR 61 | Ht 70.0 in | Wt 374.0 lb

## 2020-02-05 DIAGNOSIS — I420 Dilated cardiomyopathy: Secondary | ICD-10-CM

## 2020-02-05 DIAGNOSIS — I5020 Unspecified systolic (congestive) heart failure: Secondary | ICD-10-CM | POA: Diagnosis not present

## 2020-02-05 DIAGNOSIS — I10 Essential (primary) hypertension: Secondary | ICD-10-CM | POA: Diagnosis not present

## 2020-02-05 DIAGNOSIS — I428 Other cardiomyopathies: Secondary | ICD-10-CM

## 2020-02-05 MED ORDER — DAPAGLIFLOZIN PROPANEDIOL 10 MG PO TABS: 10.0000 mg | ORAL_TABLET | Freq: Every day | ORAL | 1 refills | Status: DC

## 2020-02-05 NOTE — Progress Notes (Signed)
Cardiology Office Note:    Date:  02/05/2020   ID:  Kervin Bones, DOB [birth year], MRN [id number]  PCP:  Esperanza Richters, PA-C  Cardiologist:  Thomasene Ripple, DO  Electrophysiologist:  None   Referring MD: Esperanza Richters, PA-C   " I am doing well"  History of Present Illness:    Commodore Bellew is a 60 y.o. male with a hx of  nonischemic cardiomyopathy EF on echo 20 to 25% in March 2021,heart failure with reduced ejection fraction, tobacco use, hyperlipidemia and hypertension.  I saw the patient on December 15, 2019 at that time we added Farxiga to his regimen.  He is here today for follow-up visit.  He tells me he took the procedure and ran out of refills however we did not get any notification for refills for his medication. Here for no complaints cardiovascular wise but tells me that he does have significant knee pain but he is tolerating this for now.  Past Medical History:  Diagnosis Date  . Allergy   . Dilated cardiomyopathy (HCC)   . HTN (hypertension)   . Hyperlipidemia 05/24/2018    Past Surgical History:  Procedure Laterality Date  . KNEE ARTHROSCOPY  2009  . RIGHT/LEFT HEART CATH AND CORONARY ANGIOGRAPHY N/A 05/26/2018   Procedure: RIGHT/LEFT HEART CATH AND CORONARY ANGIOGRAPHY;  Surgeon: Lyn Records, MD;  Location: [facility];  Service: Cardiovascular;  Laterality: N/A;  . THROAT SURGERY  2005    Current Medications: Current Meds  Medication Sig  . albuterol (PROVENTIL HFA;VENTOLIN HFA) 108 (90 Base) MCG/ACT inhaler Inhale 2 puffs into the lungs every 6 (six) hours as needed for wheezing or shortness of breath.  . allopurinol (ZYLOPRIM) 100 MG tablet Take 1 tablet (100 mg total) by mouth daily.  . carvedilol (COREG) 12.5 MG tablet Take 1 tablet (12.5 mg total) by mouth 2 (two) times daily with a meal.  . colchicine 0.6 MG tablet Take 1 tablet (0.6 mg total) by mouth 2 (two) times daily.  Marland Kitchen ENTRESTO 24-26 MG TAKE 1 TABLET BY MOUTH TWICE DAILY  .  spironolactone (ALDACTONE) 25 MG tablet Take 0.5 tablets (12.5 mg total) by mouth daily.  Marland Kitchen torsemide (DEMADEX) 20 MG tablet Take 80 mg (4 tablets) in the morning and 20 mg (1 tablets) in the evening  . [DISCONTINUED] dapagliflozin propanediol (FARXIGA) 10 MG TABS tablet Take 1 tablet (10 mg total) by mouth daily before breakfast.     Allergies:   Tetracyclines & related   Social History   Socioeconomic History  . Marital status: Married    Spouse name: Not on file  . Number of children: Not on file  . Years of education: Not on file  . Highest education level: Not on file  Occupational History  . Not on file  Tobacco Use  . Smoking status: Former Smoker    Packs/day: 1.00    Years: 20.00    Pack years: 20.00    Types: Cigarettes    Quit date: 05/23/2018    Years since quitting: 1.7  . Smokeless tobacco: Never Used  Substance and Sexual Activity  . Alcohol use: Yes    Alcohol/week: 0.0 standard drinks    Comment: 6 pack a week spread out over weekend  . Drug use: No  . Sexual activity: Yes  Other Topics Concern  . Not on file  Social History Narrative   [employer].   Married lives with wife, mother in law and daughter.  7 children.  Social Determinants of Health   Financial Resource Strain: Not on file  Food Insecurity: Not on file  Transportation Needs: Not on file  Physical Activity: Not on file  Stress: Not on file  Social Connections: Not on file     Family History: The patient's family history includes Cancer in his father; Hearing loss in his mother; Heart failure in his paternal grandfather and another family member.  ROS:   Review of Systems  Constitution: Negative for decreased appetite, fever and weight gain.  HENT: Negative for congestion, ear discharge, hoarse voice and sore throat.   Eyes: Negative for discharge, redness, vision loss in right eye and visual halos.  Cardiovascular: Negative for chest pain, dyspnea on exertion, leg swelling, orthopnea and  palpitations.  Respiratory: Negative for cough, hemoptysis, shortness of breath and snoring.   Endocrine: Negative for heat intolerance and polyphagia.  Hematologic/Lymphatic: Negative for bleeding problem. Does not bruise/bleed easily.  Skin: Negative for flushing, nail changes, rash and suspicious lesions.  Musculoskeletal: Negative for arthritis, joint pain, muscle cramps, myalgias, neck pain and stiffness.  Gastrointestinal: Negative for abdominal pain, bowel incontinence, diarrhea and excessive appetite.  Genitourinary: Negative for decreased libido, genital sores and incomplete emptying.  Neurological: Negative for brief paralysis, focal weakness, headaches and loss of balance.  Psychiatric/Behavioral: Negative for altered mental status, depression and suicidal ideas.  Allergic/Immunologic: Negative for HIV exposure and persistent infections.    EKGs/Labs/Other Studies Reviewed:    The following studies were reviewed today:   EKG: None today  Recent Labs: 06/30/2019: Hemoglobin 11.6; Platelets 226.0; Pro B Natriuretic peptide (BNP) 45.0 11/13/2019: Brain Natriuretic Peptide 8 12/15/2019: ALT 17; BUN 14; Creatinine, Ser 1.04; Potassium 3.8; Sodium 141  Recent Lipid Panel    Component Value Date/Time   CHOL 152 05/25/2018 0343   TRIG 69 05/25/2018 0343   HDL 36 (L) 05/25/2018 0343   CHOLHDL 4.2 05/25/2018 0343   VLDL 14 05/25/2018 0343   LDLCALC 102 (H) 05/25/2018 0343    Physical Exam:    VS:  BP 128/66   Pulse 61   Ht 5\' 10"  (1.778 m)   Wt (!) 374 lb (169.6 kg)   SpO2 94%   BMI 53.66 kg/m     Wt Readings from Last 3 Encounters:  02/05/20 (!) 374 lb (169.6 kg)  12/15/19 (!) 371 lb (168.3 kg)  11/20/19 (!) 366 lb (166 kg)     GEN: Well nourished, well developed in no acute distress HEENT: Normal NECK: No JVD; No carotid bruits LYMPHATICS: No lymphadenopathy CARDIAC: S1S2 noted,RRR, no murmurs, rubs, gallops RESPIRATORY:  Clear to auscultation without rales,  wheezing or rhonchi  ABDOMEN: Soft, non-tender, non-distended, +bowel sounds, no guarding. EXTREMITIES: No edema, No cyanosis, no clubbing MUSCULOSKELETAL:  No deformity  SKIN: Warm and dry NEUROLOGIC:  Alert and oriented x 3, non-focal PSYCHIATRIC:  Normal affect, good insight  ASSESSMENT:    1. Nonischemic cardiomyopathy (HCC)   2. Dilated cardiomyopathy (HCC)   3. Essential hypertension   4. HFrEF (heart failure with reduced ejection fraction) (HCC)   5. Obesity, Class III, BMI 40-49.9 (morbid obesity) (HCC)    PLAN:      I will continue the patient on his Coreg 12.5 mg twice daily, Aldactone 12.5 mg daily, Entresto 24-26 mg twice a day and refill his [place].  In the meantime I am going to get an echocardiogram to reassess his LV function.  I plan to see the patient back in 4 weeks after he has gotten  his echocardiogram to discuss the results and any additional medication optimization and additional referral to our advanced heart failure clinic if appropriate.  He is euvolemic today.  Blood pressure is acceptable, continue with current antihypertensive regimen.  The patient is in agreement with the above plan. The patient left the office in stable condition.  The patient will follow up in 4 weeks or sooner if needed.   Medication Adjustments/Labs and Tests Ordered: Current medicines are reviewed at length with the patient today.  Concerns regarding medicines are outlined above.  Orders Placed This Encounter  Procedures  . Basic metabolic panel  . Magnesium  . ECHOCARDIOGRAM COMPLETE   Meds ordered this encounter  Medications  . dapagliflozin propanediol (FARXIGA) 10 MG TABS tablet    Sig: Take 1 tablet (10 mg total) by mouth daily before breakfast.    Dispense:  90 tablet    Refill:  1    Patient Instructions   Medication Instructions:  Your physician recommends that you continue on your current medications as directed. Please refer to the Current Medication list  given to you today.  *If you need a refill on your cardiac medications before your next appointment, please call your pharmacy*   Lab Work: Your physician recommends that you return for lab work today: bmp, mg  If you have labs (blood work) drawn today and your tests are completely normal, you will receive your results only by: Marland Kitchen. MyChart Message (if you have MyChart) OR . A paper copy in the mail If you have any lab test that is abnormal or we need to change your treatment, we will call you to review the results.   Testing/Procedures: Your physician has requested that you have an echocardiogram. Echocardiography is a painless test that uses sound waves to create images of your heart. It provides your doctor with information about the size and shape of your heart and how well your heart's chambers and valves are working. This procedure takes approximately one hour. There are no restrictions for this procedure.     Follow-Up: At [facility], you and your health needs are our priority.  As part of our continuing mission to provide you with exceptional heart care, we have created designated Provider Care Teams.  These Care Teams include your primary Cardiologist (physician) and Advanced Practice Providers (APPs -  Physician Assistants and Nurse Practitioners) who all work together to provide you with the care you need, when you need it.  We recommend signing up for the patient portal called "MyChart".  Sign up information is provided on this After Visit Summary.  MyChart is used to connect with patients for Virtual Visits (Telemedicine).  Patients are able to view lab/test results, encounter notes, upcoming appointments, etc.  Non-urgent messages can be sent to your provider as well.   To learn more about what you can do with MyChart, go to [email or web address].    Your next appointment:   1 month(s)  The format for your next appointment:   In Person  Provider:   Thomasene RippleKardie Sayda Grable,  DO   Other Instructions   Echocardiogram An echocardiogram is a test that uses sound waves (ultrasound) to produce images of the heart. Images from an echocardiogram can provide important information about:  Heart size and shape.  The size and thickness and movement of your heart's walls.  Heart muscle function and strength.  Heart valve function or if you have stenosis. Stenosis is when the heart valves are too narrow.  If blood  is flowing backward through the heart valves (regurgitation).  A tumor or infectious growth around the heart valves.  Areas of heart muscle that are not working well because of poor blood flow or injury from a heart attack.  Aneurysm detection. An aneurysm is a weak or damaged part of an artery wall. The wall bulges out from the normal force of blood pumping through the body. Tell a health care provider about:  Any allergies you have.  All medicines you are taking, including vitamins, herbs, eye drops, creams, and over-the-counter medicines.  Any blood disorders you have.  Any surgeries you have had.  Any medical conditions you have.  Whether you are pregnant or may be pregnant. What are the risks? Generally, this is a safe test. However, problems may occur, including an allergic reaction to dye (contrast) that may be used during the test. What happens before the test? No specific preparation is needed. You may eat and drink normally. What happens during the test?  You will take off your clothes from the waist up and put on a hospital gown.  Electrodes or electrocardiogram (ECG)patches may be placed on your chest. The electrodes or patches are then connected to a device that monitors your heart rate and rhythm.  You will lie down on a table for an ultrasound exam. A gel will be applied to your chest to help sound waves pass through your skin.  A handheld device, called a transducer, will be pressed against your chest and moved over your  heart. The transducer produces sound waves that travel to your heart and bounce back (or "echo" back) to the transducer. These sound waves will be captured in real-time and changed into images of your heart that can be viewed on a video monitor. The images will be recorded on a computer and reviewed by your health care provider.  You may be asked to change positions or hold your breath for a short time. This makes it easier to get different views or better views of your heart.  In some cases, you may receive contrast through an IV in one of your veins. This can improve the quality of the pictures from your heart. The procedure may vary among health care providers and hospitals.   What can I expect after the test? You may return to your normal, everyday life, including diet, activities, and medicines, unless your health care provider tells you not to do that. Follow these instructions at home:  It is up to you to get the results of your test. Ask your health care provider, or the department that is doing the test, when your results will be ready.  Keep all follow-up visits. This is important. Summary  An echocardiogram is a test that uses sound waves (ultrasound) to produce images of the heart.  Images from an echocardiogram can provide important information about the size and shape of your heart, heart muscle function, heart valve function, and other possible heart problems.  You do not need to do anything to prepare before this test. You may eat and drink normally.  After the echocardiogram is completed, you may return to your normal, everyday life, unless your health care provider tells you not to do that. This information is not intended to replace advice given to you by your health care provider. Make sure you discuss any questions you have with your health care provider. Document Revised: 08/25/2019 Document Reviewed: 08/25/2019 Elsevier Patient Education  2021 [employer].  Adopting a Healthy Lifestyle.  Know what a healthy weight is for you (roughly BMI <25) and aim to maintain this   Aim for 7+ servings of fruits and vegetables daily   65-80+ fluid ounces of water or unsweet tea for healthy kidneys   Limit to max 1 drink of alcohol per day; avoid smoking/tobacco   Limit animal fats in diet for cholesterol and heart health - choose grass fed whenever available   Avoid highly processed foods, and foods high in saturated/trans fats   Aim for low stress - take time to unwind and care for your mental health   Aim for 150 min of moderate intensity exercise weekly for heart health, and weights twice weekly for bone health   Aim for 7-9 hours of sleep daily   When it comes to diets, agreement about the perfect plan isnt easy to find, even among the experts. Experts at the [facility] [employer] developed an idea known as the Healthy Eating Plate. Just imagine a plate divided into logical, healthy portions.   The emphasis is on diet quality:   Load up on vegetables and fruits - one-half of your plate: Aim for color and variety, and remember that potatoes dont count.   Go for whole grains - one-quarter of your plate: Whole wheat, barley, wheat berries, quinoa, oats, brown rice, and foods made with them. If you want pasta, go with whole wheat pasta.   Protein power - one-quarter of your plate: Fish, chicken, beans, and nuts are all healthy, versatile protein sources. Limit red meat.   The diet, however, does go beyond the plate, offering a few other suggestions.   Use healthy plant oils, such as olive, canola, soy, corn, sunflower and peanut. Check the labels, and avoid partially hydrogenated oil, which have unhealthy trans fats.   If youre thirsty, drink water. Coffee and tea are good in moderation, but skip sugary drinks and limit milk and dairy products to one or two daily servings.   The type of carbohydrate in the diet is more important  than the amount. Some sources of carbohydrates, such as vegetables, fruits, whole grains, and beans-are healthier than others.   Finally, stay active  Signed, Thomasene Ripple, DO  02/05/2020 11:10 AM    [facility]

## 2020-02-05 NOTE — Patient Instructions (Signed)
Medication Instructions:  Your physician recommends that you continue on your current medications as directed. Please refer to the Current Medication list given to you today.  *If you need a refill on your cardiac medications before your next appointment, please call your pharmacy*   Lab Work: Your physician recommends that you return for lab work today: bmp, mg  If you have labs (blood work) drawn today and your tests are completely normal, you will receive your results only by: Marland Kitchen MyChart Message (if you have MyChart) OR . A paper copy in the mail If you have any lab test that is abnormal or we need to change your treatment, we will call you to review the results.   Testing/Procedures: Your physician has requested that you have an echocardiogram. Echocardiography is a painless test that uses sound waves to create images of your heart. It provides your doctor with information about the size and shape of your heart and how well your heart's chambers and valves are working. This procedure takes approximately one hour. There are no restrictions for this procedure.     Follow-Up: At [facility], you and your health needs are our priority.  As part of our continuing mission to provide you with exceptional heart care, we have created designated Provider Care Teams.  These Care Teams include your primary Cardiologist (physician) and Advanced Practice Providers (APPs -  Physician Assistants and Nurse Practitioners) who all work together to provide you with the care you need, when you need it.  We recommend signing up for the patient portal called "MyChart".  Sign up information is provided on this After Visit Summary.  MyChart is used to connect with patients for Virtual Visits (Telemedicine).  Patients are able to view lab/test results, encounter notes, upcoming appointments, etc.  Non-urgent messages can be sent to your provider as well.   To learn more about what you can do with MyChart, go to  [email or web address].    Your next appointment:   1 month(s)  The format for your next appointment:   In Person  Provider:   Thomasene Ripple, DO   Other Instructions   Echocardiogram An echocardiogram is a test that uses sound waves (ultrasound) to produce images of the heart. Images from an echocardiogram can provide important information about:  Heart size and shape.  The size and thickness and movement of your heart's walls.  Heart muscle function and strength.  Heart valve function or if you have stenosis. Stenosis is when the heart valves are too narrow.  If blood is flowing backward through the heart valves (regurgitation).  A tumor or infectious growth around the heart valves.  Areas of heart muscle that are not working well because of poor blood flow or injury from a heart attack.  Aneurysm detection. An aneurysm is a weak or damaged part of an artery wall. The wall bulges out from the normal force of blood pumping through the body. Tell a health care provider about:  Any allergies you have.  All medicines you are taking, including vitamins, herbs, eye drops, creams, and over-the-counter medicines.  Any blood disorders you have.  Any surgeries you have had.  Any medical conditions you have.  Whether you are pregnant or may be pregnant. What are the risks? Generally, this is a safe test. However, problems may occur, including an allergic reaction to dye (contrast) that may be used during the test. What happens before the test? No specific preparation is needed. You may eat and drink  normally. What happens during the test?  You will take off your clothes from the waist up and put on a hospital gown.  Electrodes or electrocardiogram (ECG)patches may be placed on your chest. The electrodes or patches are then connected to a device that monitors your heart rate and rhythm.  You will lie down on a table for an ultrasound exam. A gel will be applied to your  chest to help sound waves pass through your skin.  A handheld device, called a transducer, will be pressed against your chest and moved over your heart. The transducer produces sound waves that travel to your heart and bounce back (or "echo" back) to the transducer. These sound waves will be captured in real-time and changed into images of your heart that can be viewed on a video monitor. The images will be recorded on a computer and reviewed by your health care provider.  You may be asked to change positions or hold your breath for a short time. This makes it easier to get different views or better views of your heart.  In some cases, you may receive contrast through an IV in one of your veins. This can improve the quality of the pictures from your heart. The procedure may vary among health care providers and hospitals.   What can I expect after the test? You may return to your normal, everyday life, including diet, activities, and medicines, unless your health care provider tells you not to do that. Follow these instructions at home:  It is up to you to get the results of your test. Ask your health care provider, or the department that is doing the test, when your results will be ready.  Keep all follow-up visits. This is important. Summary  An echocardiogram is a test that uses sound waves (ultrasound) to produce images of the heart.  Images from an echocardiogram can provide important information about the size and shape of your heart, heart muscle function, heart valve function, and other possible heart problems.  You do not need to do anything to prepare before this test. You may eat and drink normally.  After the echocardiogram is completed, you may return to your normal, everyday life, unless your health care provider tells you not to do that. This information is not intended to replace advice given to you by your health care provider. Make sure you discuss any questions you have with  your health care provider. Document Revised: 08/25/2019 Document Reviewed: 08/25/2019 Elsevier Patient Education  2021 [employer].

## 2020-02-06 LAB — BASIC METABOLIC PANEL
BUN/Creatinine Ratio: 14 (ref 9–20)
BUN: 16 mg/dL (ref 6–24)
CO2: 26 mmol/L (ref 20–29)
Calcium: 9.1 mg/dL (ref 8.7–10.2)
Chloride: 104 mmol/L (ref 96–106)
Creatinine, Ser: 1.11 mg/dL (ref 0.76–1.27)
GFR calc Af Amer: 85 mL/min/{1.73_m2} (ref >59)
GFR calc non Af Amer: 73 mL/min/{1.73_m2} (ref >59)
Glucose: 109 mg/dL — ABNORMAL HIGH (ref 65–99)
Potassium: 3.7 mmol/L (ref 3.5–5.2)
Sodium: 146 mmol/L — ABNORMAL HIGH (ref 134–144)

## 2020-02-06 LAB — MAGNESIUM: Magnesium: 1.9 mg/dL (ref 1.6–2.3)

## 2020-02-29 ENCOUNTER — Other Ambulatory Visit: Payer: Self-pay

## 2020-02-29 ENCOUNTER — Ambulatory Visit (HOSPITAL_BASED_OUTPATIENT_CLINIC_OR_DEPARTMENT_OTHER)
Admission: RE | Admit: 2020-02-29 | Discharge: 2020-02-29 | Disposition: A | Discharge status: Home / Self Care | Payer: Federal, State, Local not specified - PPO | Source: Ambulatory Visit | Attending: Cardiology | Admitting: Cardiology

## 2020-02-29 DIAGNOSIS — I420 Dilated cardiomyopathy: Secondary | ICD-10-CM | POA: Diagnosis not present

## 2020-02-29 DIAGNOSIS — J9811 Atelectasis: Secondary | ICD-10-CM | POA: Diagnosis not present

## 2020-02-29 DIAGNOSIS — J90 Pleural effusion, not elsewhere classified: Secondary | ICD-10-CM | POA: Diagnosis not present

## 2020-02-29 DIAGNOSIS — M109 Gout, unspecified: Secondary | ICD-10-CM | POA: Diagnosis not present

## 2020-02-29 DIAGNOSIS — Z6841 Body Mass Index (BMI) 40.0 and over, adult: Secondary | ICD-10-CM | POA: Diagnosis not present

## 2020-02-29 DIAGNOSIS — Z79899 Other long term (current) drug therapy: Secondary | ICD-10-CM | POA: Diagnosis not present

## 2020-02-29 DIAGNOSIS — Z87891 Personal history of nicotine dependence: Secondary | ICD-10-CM | POA: Diagnosis not present

## 2020-02-29 DIAGNOSIS — E785 Hyperlipidemia, unspecified: Secondary | ICD-10-CM | POA: Diagnosis not present

## 2020-02-29 DIAGNOSIS — Z20822 Contact with and (suspected) exposure to covid-19: Secondary | ICD-10-CM | POA: Diagnosis not present

## 2020-02-29 DIAGNOSIS — E876 Hypokalemia: Secondary | ICD-10-CM | POA: Diagnosis not present

## 2020-02-29 DIAGNOSIS — I110 Hypertensive heart disease with heart failure: Secondary | ICD-10-CM | POA: Diagnosis not present

## 2020-02-29 DIAGNOSIS — J9601 Acute respiratory failure with hypoxia: Secondary | ICD-10-CM | POA: Diagnosis not present

## 2020-02-29 DIAGNOSIS — N179 Acute kidney failure, unspecified: Secondary | ICD-10-CM | POA: Diagnosis not present

## 2020-02-29 DIAGNOSIS — Z888 Allergy status to other drugs, medicaments and biological substances status: Secondary | ICD-10-CM | POA: Diagnosis not present

## 2020-02-29 DIAGNOSIS — I428 Other cardiomyopathies: Secondary | ICD-10-CM | POA: Diagnosis not present

## 2020-02-29 DIAGNOSIS — R0602 Shortness of breath: Secondary | ICD-10-CM | POA: Diagnosis not present

## 2020-02-29 DIAGNOSIS — I2699 Other pulmonary embolism without acute cor pulmonale: Secondary | ICD-10-CM | POA: Diagnosis not present

## 2020-02-29 LAB — ECHOCARDIOGRAM COMPLETE
Area-P 1/2: 4.17 cm2
S' Lateral: 5.91 cm

## 2020-02-29 MED ORDER — PERFLUTREN LIPID MICROSPHERE
1.0000 mL | INTRAVENOUS | Status: AC | PRN
  Administered 2020-02-29: 4 mL via INTRAVENOUS

## 2020-03-03 ENCOUNTER — Other Ambulatory Visit: Payer: Self-pay | Admitting: Medical

## 2020-03-03 ENCOUNTER — Inpatient Hospital Stay (HOSPITAL_BASED_OUTPATIENT_CLINIC_OR_DEPARTMENT_OTHER)
Admission: EM | Admit: 2020-03-03 | Discharge: 2020-03-09 | DRG: 175 | Disposition: A | Discharge status: Home / Self Care | Payer: Federal, State, Local not specified - PPO | Attending: Internal Medicine | Admitting: Internal Medicine

## 2020-03-03 ENCOUNTER — Emergency Department (HOSPITAL_BASED_OUTPATIENT_CLINIC_OR_DEPARTMENT_OTHER): Payer: Federal, State, Local not specified - PPO

## 2020-03-03 ENCOUNTER — Other Ambulatory Visit: Payer: Self-pay

## 2020-03-03 ENCOUNTER — Encounter (HOSPITAL_BASED_OUTPATIENT_CLINIC_OR_DEPARTMENT_OTHER): Payer: Self-pay | Admitting: *Deleted

## 2020-03-03 ENCOUNTER — Ambulatory Visit: Payer: Federal, State, Local not specified - PPO | Admitting: Cardiology

## 2020-03-03 ENCOUNTER — Encounter: Payer: Self-pay | Admitting: Cardiology

## 2020-03-03 VITALS — BP 100/70 | HR 131 | Ht 70.0 in | Wt 358.0 lb

## 2020-03-03 DIAGNOSIS — I5022 Chronic systolic (congestive) heart failure: Secondary | ICD-10-CM | POA: Diagnosis present

## 2020-03-03 DIAGNOSIS — E876 Hypokalemia: Secondary | ICD-10-CM | POA: Diagnosis not present

## 2020-03-03 DIAGNOSIS — Z720 Tobacco use: Secondary | ICD-10-CM | POA: Diagnosis present

## 2020-03-03 DIAGNOSIS — I420 Dilated cardiomyopathy: Secondary | ICD-10-CM

## 2020-03-03 DIAGNOSIS — E785 Hyperlipidemia, unspecified: Secondary | ICD-10-CM | POA: Diagnosis present

## 2020-03-03 DIAGNOSIS — I483 Typical atrial flutter: Secondary | ICD-10-CM | POA: Diagnosis not present

## 2020-03-03 DIAGNOSIS — E782 Mixed hyperlipidemia: Secondary | ICD-10-CM

## 2020-03-03 DIAGNOSIS — J9601 Acute respiratory failure with hypoxia: Secondary | ICD-10-CM | POA: Diagnosis present

## 2020-03-03 DIAGNOSIS — I5020 Unspecified systolic (congestive) heart failure: Secondary | ICD-10-CM

## 2020-03-03 DIAGNOSIS — Z79899 Other long term (current) drug therapy: Secondary | ICD-10-CM

## 2020-03-03 DIAGNOSIS — I10 Essential (primary) hypertension: Secondary | ICD-10-CM | POA: Diagnosis present

## 2020-03-03 DIAGNOSIS — M109 Gout, unspecified: Secondary | ICD-10-CM | POA: Diagnosis present

## 2020-03-03 DIAGNOSIS — I110 Hypertensive heart disease with heart failure: Secondary | ICD-10-CM | POA: Diagnosis not present

## 2020-03-03 DIAGNOSIS — Z6841 Body Mass Index (BMI) 40.0 and over, adult: Secondary | ICD-10-CM

## 2020-03-03 DIAGNOSIS — Z20822 Contact with and (suspected) exposure to covid-19: Secondary | ICD-10-CM | POA: Diagnosis present

## 2020-03-03 DIAGNOSIS — E66813 Obesity, class 3: Secondary | ICD-10-CM | POA: Diagnosis present

## 2020-03-03 DIAGNOSIS — N179 Acute kidney failure, unspecified: Secondary | ICD-10-CM | POA: Diagnosis present

## 2020-03-03 DIAGNOSIS — R0602 Shortness of breath: Secondary | ICD-10-CM

## 2020-03-03 DIAGNOSIS — I2602 Saddle embolus of pulmonary artery with acute cor pulmonale: Secondary | ICD-10-CM | POA: Diagnosis not present

## 2020-03-03 DIAGNOSIS — I2699 Other pulmonary embolism without acute cor pulmonale: Secondary | ICD-10-CM | POA: Diagnosis not present

## 2020-03-03 DIAGNOSIS — Z87891 Personal history of nicotine dependence: Secondary | ICD-10-CM

## 2020-03-03 DIAGNOSIS — I428 Other cardiomyopathies: Secondary | ICD-10-CM | POA: Diagnosis not present

## 2020-03-03 DIAGNOSIS — J90 Pleural effusion, not elsewhere classified: Secondary | ICD-10-CM | POA: Diagnosis not present

## 2020-03-03 DIAGNOSIS — Z888 Allergy status to other drugs, medicaments and biological substances status: Secondary | ICD-10-CM | POA: Diagnosis not present

## 2020-03-03 DIAGNOSIS — I5023 Acute on chronic systolic (congestive) heart failure: Secondary | ICD-10-CM | POA: Diagnosis not present

## 2020-03-03 DIAGNOSIS — I272 Pulmonary hypertension, unspecified: Secondary | ICD-10-CM

## 2020-03-03 DIAGNOSIS — J9811 Atelectasis: Secondary | ICD-10-CM | POA: Diagnosis not present

## 2020-03-03 DIAGNOSIS — I5043 Acute on chronic combined systolic (congestive) and diastolic (congestive) heart failure: Secondary | ICD-10-CM

## 2020-03-03 DIAGNOSIS — R0902 Hypoxemia: Secondary | ICD-10-CM | POA: Diagnosis not present

## 2020-03-03 DIAGNOSIS — I4892 Unspecified atrial flutter: Secondary | ICD-10-CM | POA: Diagnosis not present

## 2020-03-03 HISTORY — DX: Acute kidney failure, unspecified: N17.9

## 2020-03-03 HISTORY — DX: Chronic systolic (congestive) heart failure: I50.22

## 2020-03-03 HISTORY — DX: Other pulmonary embolism without acute cor pulmonale: I26.99

## 2020-03-03 HISTORY — DX: Hypokalemia: E87.6

## 2020-03-03 LAB — PROTIME-INR
INR: 1.1 (ref 0.8–1.2)
Prothrombin Time: 13.8 seconds (ref 11.4–15.2)

## 2020-03-03 LAB — CBC
HCT: 39.1 % (ref 39.0–52.0)
Hemoglobin: 12.9 g/dL — ABNORMAL LOW (ref 13.0–17.0)
MCH: 30.2 pg (ref 26.0–34.0)
MCHC: 33 g/dL (ref 30.0–36.0)
MCV: 91.6 fL (ref 80.0–100.0)
Platelets: 352 10*3/uL (ref 150–400)
RBC: 4.27 MIL/uL (ref 4.22–5.81)
RDW: 13.7 % (ref 11.5–15.5)
WBC: 9.1 10*3/uL (ref 4.0–10.5)
nRBC: 0 % (ref 0.0–0.2)

## 2020-03-03 LAB — RESP PANEL BY RT-PCR (FLU A&B, COVID) ARPGX2
Influenza A by PCR: NEGATIVE
Influenza B by PCR: NEGATIVE
SARS Coronavirus 2 by RT PCR: NEGATIVE

## 2020-03-03 LAB — BASIC METABOLIC PANEL
Anion gap: 12 (ref 5–15)
BUN: 28 mg/dL — ABNORMAL HIGH (ref 6–20)
CO2: 28 mmol/L (ref 22–32)
Calcium: 9.3 mg/dL (ref 8.9–10.3)
Chloride: 97 mmol/L — ABNORMAL LOW (ref 98–111)
Creatinine, Ser: 1.44 mg/dL — ABNORMAL HIGH (ref 0.61–1.24)
GFR, Estimated: 57 mL/min — ABNORMAL LOW (ref >60)
Glucose, Bld: 122 mg/dL — ABNORMAL HIGH (ref 70–99)
Potassium: 3.1 mmol/L — ABNORMAL LOW (ref 3.5–5.1)
Sodium: 137 mmol/L (ref 135–145)

## 2020-03-03 LAB — HEPARIN LEVEL (UNFRACTIONATED): Heparin Unfractionated: 0.13 IU/mL — ABNORMAL LOW (ref 0.30–0.70)

## 2020-03-03 LAB — TROPONIN I (HIGH SENSITIVITY)
Troponin I (High Sensitivity): 11 ng/L (ref <18)
Troponin I (High Sensitivity): 11 ng/L (ref <18)

## 2020-03-03 LAB — LACTIC ACID, PLASMA: Lactic Acid, Venous: 1.3 mmol/L (ref 0.5–1.9)

## 2020-03-03 MED ORDER — HEPARIN (PORCINE) 25000 UT/250ML-% IV SOLN
2600.0000 [IU]/h | INTRAVENOUS | Status: DC
  Administered 2020-03-03: 1800 [IU]/h via INTRAVENOUS
  Administered 2020-03-04: 2500 [IU]/h via INTRAVENOUS
  Administered 2020-03-04: 2400 [IU]/h via INTRAVENOUS
  Administered 2020-03-05 – 2020-03-06 (×4): 2600 [IU]/h via INTRAVENOUS
  Filled 2020-03-03 (×10): qty 250

## 2020-03-03 MED ORDER — SODIUM CHLORIDE 0.9 % IV SOLN: 250.0000 mL | INTRAVENOUS | Status: DC | PRN

## 2020-03-03 MED ORDER — ALBUTEROL SULFATE HFA 108 (90 BASE) MCG/ACT IN AERS
2.0000 | INHALATION_SPRAY | Freq: Four times a day (QID) | RESPIRATORY_TRACT | Status: DC | PRN
  Filled 2020-03-03: qty 6.7

## 2020-03-03 MED ORDER — HEPARIN BOLUS VIA INFUSION
6500.0000 [IU] | Freq: Once | INTRAVENOUS | Status: AC
  Administered 2020-03-03: 6500 [IU] via INTRAVENOUS

## 2020-03-03 MED ORDER — ACETAMINOPHEN 650 MG RE SUPP: 650.0000 mg | Freq: Four times a day (QID) | RECTAL | Status: DC | PRN

## 2020-03-03 MED ORDER — HYDROCODONE-ACETAMINOPHEN 5-325 MG PO TABS: 1.0000 | ORAL_TABLET | ORAL | Status: DC | PRN

## 2020-03-03 MED ORDER — IOHEXOL 350 MG/ML SOLN
100.0000 mL | Freq: Once | INTRAVENOUS | Status: AC | PRN
  Administered 2020-03-03: 100 mL via INTRAVENOUS

## 2020-03-03 MED ORDER — HEPARIN (PORCINE) 25000 UT/250ML-% IV SOLN
INTRAVENOUS | Status: AC
  Filled 2020-03-03: qty 250

## 2020-03-03 MED ORDER — ACETAMINOPHEN 325 MG PO TABS: 650.0000 mg | ORAL_TABLET | Freq: Four times a day (QID) | ORAL | Status: DC | PRN

## 2020-03-03 MED ORDER — ALLOPURINOL 100 MG PO TABS
100.0000 mg | ORAL_TABLET | Freq: Every day | ORAL | Status: DC
  Administered 2020-03-04 – 2020-03-09 (×6): 100 mg via ORAL
  Filled 2020-03-03 (×6): qty 1

## 2020-03-03 MED ORDER — HEPARIN BOLUS VIA INFUSION
3300.0000 [IU] | Freq: Once | INTRAVENOUS | Status: AC
  Administered 2020-03-03: 3300 [IU] via INTRAVENOUS
  Filled 2020-03-03: qty 3300

## 2020-03-03 MED ORDER — POTASSIUM CHLORIDE CRYS ER 20 MEQ PO TBCR
40.0000 meq | EXTENDED_RELEASE_TABLET | Freq: Once | ORAL | Status: AC
  Administered 2020-03-03: 40 meq via ORAL
  Filled 2020-03-03: qty 2

## 2020-03-03 MED ORDER — SODIUM CHLORIDE 0.9% FLUSH: 3.0000 mL | INTRAVENOUS | Status: DC | PRN

## 2020-03-03 MED ORDER — SODIUM CHLORIDE 0.9% FLUSH
3.0000 mL | Freq: Two times a day (BID) | INTRAVENOUS | Status: DC
  Administered 2020-03-03 – 2020-03-09 (×9): 3 mL via INTRAVENOUS

## 2020-03-03 NOTE — Progress Notes (Signed)
Cardiology Office Note:    Date:  03/03/2020   ID:  Drew [name], DOB 08-09-1960, MRN [id number]  PCP:  Esperanza Richters, PA-C  Cardiologist:  Thomasene Ripple, DO  Electrophysiologist:  None   Referring MD: Marisue Brooklyn   Chief Complaint  Patient presents with  . Follow-up   History of Present Illness:    Drew [name] is a 60 y.o. male with a hx of nonischemic cardiomyopathy EF on echo 20 to 25% in March 2021,heart failure with reduced ejection fraction, tobacco use, hyperlipidemia and hypertension.  I saw the patient on February 05, 2020 at that time we continued his medication regimen which included Coreg 12.5 mg twice daily, Aldactone 12.5 mg daily, Entresto 24-26 mg twice daily and refill his [place].  His diuretics were also continued.  We have patient get an echocardiogram. In the interim he was able to get his echocardiogram  He is here today for follow-up visit with his wife.  His wife tells me that the patient has been significantly short of breath at home on minimal exertion as well as he has been hypoxic.  She is worried.  The patient also tells me that he has had some chest pain when he breathes and he has been really working hard not to take any deep breaths.   Past Medical History:  Diagnosis Date  . Allergy   . Dilated cardiomyopathy (HCC)   . HTN (hypertension)   . Hyperlipidemia 05/24/2018    Past Surgical History:  Procedure Laterality Date  . KNEE ARTHROSCOPY  2009  . RIGHT/LEFT HEART CATH AND CORONARY ANGIOGRAPHY N/A 05/26/2018   Procedure: RIGHT/LEFT HEART CATH AND CORONARY ANGIOGRAPHY;  Surgeon: Lyn Records, MD;  Location: [facility];  Service: Cardiovascular;  Laterality: N/A;  . THROAT SURGERY  2005    Current Medications: Current Meds  Medication Sig  . albuterol (PROVENTIL HFA;VENTOLIN HFA) 108 (90 Base) MCG/ACT inhaler Inhale 2 puffs into the lungs every 6 (six) hours as needed for wheezing or shortness of breath.  . allopurinol  (ZYLOPRIM) 100 MG tablet TAKE 1 TABLET(100 MG) BY MOUTH DAILY  . carvedilol (COREG) 12.5 MG tablet Take 1 tablet (12.5 mg total) by mouth 2 (two) times daily with a meal.  . ENTRESTO 24-26 MG TAKE 1 TABLET BY MOUTH TWICE DAILY  . spironolactone (ALDACTONE) 25 MG tablet Take 0.5 tablets (12.5 mg total) by mouth daily.  Marland Kitchen torsemide (DEMADEX) 20 MG tablet Take 80 mg (4 tablets) in the morning and 20 mg (1 tablets) in the evening     Allergies:   Tetracyclines & related   Social History   Socioeconomic History  . Marital status: Married    Spouse name: Not on file  . Number of children: Not on file  . Years of education: Not on file  . Highest education level: Not on file  Occupational History  . Not on file  Tobacco Use  . Smoking status: Former Smoker    Packs/day: 1.00    Years: 20.00    Pack years: 20.00    Types: Cigarettes    Quit date: 05/23/2018    Years since quitting: 1.7  . Smokeless tobacco: Never Used  Substance and Sexual Activity  . Alcohol use: Yes    Alcohol/week: 0.0 standard drinks    Comment: 6 pack a week spread out over weekend  . Drug use: No  . Sexual activity: Yes  Other Topics Concern  . Not on file  Social History  Narrative   [employer].   Married lives with wife, mother in law and daughter.  7 children.     Social Determinants of Health   Financial Resource Strain: Not on file  Food Insecurity: Not on file  Transportation Needs: Not on file  Physical Activity: Not on file  Stress: Not on file  Social Connections: Not on file     Family History: The patient's family history includes Cancer in his father; Hearing loss in his mother; Heart failure in his paternal grandfather and another family member.  ROS:   Review of Systems  Constitution: Negative for decreased appetite, fever and weight gain.  HENT: Negative for congestion, ear discharge, hoarse voice and sore throat.   Eyes: Negative for discharge, redness, vision loss in right eye and visual  halos.  Cardiovascular: Negative for chest pain, dyspnea on exertion, leg swelling, orthopnea and palpitations.  Respiratory: Negative for cough, hemoptysis, shortness of breath and snoring.   Endocrine: Negative for heat intolerance and polyphagia.  Hematologic/Lymphatic: Negative for bleeding problem. Does not bruise/bleed easily.  Skin: Negative for flushing, nail changes, rash and suspicious lesions.  Musculoskeletal: Negative for arthritis, joint pain, muscle cramps, myalgias, neck pain and stiffness.  Gastrointestinal: Negative for abdominal pain, bowel incontinence, diarrhea and excessive appetite.  Genitourinary: Negative for decreased libido, genital sores and incomplete emptying.  Neurological: Negative for brief paralysis, focal weakness, headaches and loss of balance.  Psychiatric/Behavioral: Negative for altered mental status, depression and suicidal ideas.  Allergic/Immunologic: Negative for HIV exposure and persistent infections.    EKGs/Labs/Other Studies Reviewed:    The following studies were reviewed today:   EKG: None today  Transthoracic echocardiogram IMPRESSIONS    1. Left ventricular ejection fraction, by estimation, is 25 to 30%. The  left ventricle has severely decreased function. The left ventricle  demonstrates global hypokinesis. Left ventricular diastolic parameters are  indeterminate.  2. Right ventricular systolic function is normal. The right ventricular  size is mildly enlarged.  3. The mitral valve is normal in structure. Mild mitral valve  regurgitation. No evidence of mitral stenosis.  4. Tricuspid valve regurgitation is mild to moderate.  5. The aortic valve is normal in structure. Aortic valve regurgitation is  not visualized. No aortic stenosis is present.  6. The inferior vena cava is normal in size with greater than 50%  respiratory variability, suggesting right atrial pressure of 3 mmHg.    Right and left heart  catheterization  LV end diastolic pressure is moderately elevated.  There is severe left ventricular systolic dysfunction.  Hemodynamic findings consistent with mild pulmonary hypertension.    Right dominant coronary anatomy with widely patent left main, LAD, circumflex, and RCA.  Acute on chronic systolic heart failure with mildly to moderately elevated filling pressures mean wedge 20 mmHg and mean LVEDP 24 mmHg.  Echo documented LVEF less than 30%.  Mild to moderate pulmonary hypertension, cardiac output by Fick 7.7 L/min.  Right atrial mean 6 mmHg.  No evidence of right heart failure.    Recent Labs: 06/30/2019: Hemoglobin 11.6; Platelets 226.0; Pro B Natriuretic peptide (BNP) 45.0 11/13/2019: Brain Natriuretic Peptide 8 12/15/2019: ALT 17 02/05/2020: BUN 16; Creatinine, Ser 1.11; Magnesium 1.9; Potassium 3.7; Sodium 146  Recent Lipid Panel    Component Value Date/Time   CHOL 152 05/25/2018 0343   TRIG 69 05/25/2018 0343   HDL 36 (L) 05/25/2018 0343   CHOLHDL 4.2 05/25/2018 0343   VLDL 14 05/25/2018 0343   LDLCALC 102 (H) 05/25/2018 0343  Physical Exam:    VS:  BP 100/70 (BP Location: Right Arm, Patient Position: Sitting, Cuff Size: Large)   Pulse (!) 131   Ht 5\' 10"  (1.778 m)   Wt (!) 358 lb (162.4 kg)   SpO2 (!) 89%   BMI 51.37 kg/m     Wt Readings from Last 3 Encounters:  03/03/20 (!) 358 lb (162.4 kg)  02/05/20 (!) 374 lb (169.6 kg)  12/15/19 (!) 371 lb (168.3 kg)     GEN: Well nourished, well developed in no acute distress HEENT: Normal NECK: No JVD; No carotid bruits LYMPHATICS: No lymphadenopathy CARDIAC: S1S2 noted,RRR, no murmurs, rubs, gallops RESPIRATORY:  Clear to auscultation without rales, wheezing or rhonchi  ABDOMEN: Soft, non-tender, non-distended, +bowel sounds, no guarding. EXTREMITIES: No edema, No cyanosis, no clubbing MUSCULOSKELETAL:  No deformity  SKIN: Warm and dry NEUROLOGIC:  Alert and oriented x 3, non-focal PSYCHIATRIC:   Normal affect, good insight  ASSESSMENT:    1. Hypoxia   2. Essential hypertension   3. Nonischemic cardiomyopathy (HCC)   4. HFrEF (heart failure with reduced ejection fraction) (HCC)   5. Dilated cardiomyopathy (HCC)   6. Primary hypertension   7. Mixed hyperlipidemia   8. Obesity, Class III, BMI 40-49.9 (morbid obesity) (HCC)   9. Pulmonary hypertension, unspecified (HCC)    PLAN:     He is hypoxic the highest his oxygen saturation came up to was 89 as well as he is tachycardic with his heart rate was 131.  He is short of breath on exertion according to his wife.  This is worse.  With his report also of pleuritic chest wall pain is I am concerned about pulmonary embolus in this patient.  Going to send the patient to the emergency department.  He does not appear to be hypervolemic.  He is lost about 20 pounds since the last time I saw him and responding well to his diuretics.  His ejection fraction has not improved compared to his previous echocardiogram his most recent echo cardiogram showed EF of 25 to 30%.  I am going to refer the patient to advanced heart failure as well as I had a discussion with the patient that we need to start considering AICD placement if medication is not going to help with his EF at this time.  The patient understands the need to lose weight with diet and exercise. We have discussed specific strategies for this.  The patient is in agreement with the above plan. The patient left the office in stable condition.  The patient will follow up in   Medication Adjustments/Labs and Tests Ordered: Current medicines are reviewed at length with the patient today.  Concerns regarding medicines are outlined above.  No orders of the defined types were placed in this encounter.  No orders of the defined types were placed in this encounter.   Patient Instructions  Admitted to ED  Your next appointment:   1 month(s)  The format for your next appointment:   In  Person  Provider:   12/17/19, DO   Other Instructions      Adopting a Healthy Lifestyle.  Know what a healthy weight is for you (roughly BMI <25) and aim to maintain this   Aim for 7+ servings of fruits and vegetables daily   65-80+ fluid ounces of water or unsweet tea for healthy kidneys   Limit to max 1 drink of alcohol per day; avoid smoking/tobacco   Limit animal fats in diet for  cholesterol and heart health - choose grass fed whenever available   Avoid highly processed foods, and foods high in saturated/trans fats   Aim for low stress - take time to unwind and care for your mental health   Aim for 150 min of moderate intensity exercise weekly for heart health, and weights twice weekly for bone health   Aim for 7-9 hours of sleep daily   When it comes to diets, agreement about the perfect plan isnt easy to find, even among the experts. Experts at the [facility] of [employer] developed an idea known as the Healthy Eating Plate. Just imagine a plate divided into logical, healthy portions.   The emphasis is on diet quality:   Load up on vegetables and fruits - one-half of your plate: Aim for color and variety, and remember that potatoes dont count.   Go for whole grains - one-quarter of your plate: Whole wheat, barley, wheat berries, quinoa, oats, brown rice, and foods made with them. If you want pasta, go with whole wheat pasta.   Protein power - one-quarter of your plate: Fish, chicken, beans, and nuts are all healthy, versatile protein sources. Limit red meat.   The diet, however, does go beyond the plate, offering a few other suggestions.   Use healthy plant oils, such as olive, canola, soy, corn, sunflower and peanut. Check the labels, and avoid partially hydrogenated oil, which have unhealthy trans fats.   If youre thirsty, drink water. Coffee and tea are good in moderation, but skip sugary drinks and limit milk and dairy products to one or two daily  servings.   The type of carbohydrate in the diet is more important than the amount. Some sources of carbohydrates, such as vegetables, fruits, whole grains, and beans-are healthier than others.   Finally, stay active  Signed, Thomasene Ripple, DO  03/03/2020 10:11 AM    [facility]

## 2020-03-03 NOTE — Progress Notes (Signed)
   03/03/20 2145  Assess: MEWS Score  Temp 98 F (36.7 C)  BP 128/76  Pulse Rate (!) 122  Resp 19  SpO2 96 %  O2 Device Nasal Cannula  O2 Flow Rate (L/min) 3 L/min  Assess: MEWS Score  MEWS Temp 0  MEWS Systolic 0  MEWS Pulse 2  MEWS RR 0  MEWS LOC 0  MEWS Score 2  MEWS Score Color Yellow  Assess: if the MEWS score is Yellow or Red  Were vital signs taken at a resting state? Yes  Focused Assessment No change from prior assessment (on and off yellows [place]ews since admission)  Early Detection of Sepsis Score *See Row Information* Low  MEWS guidelines implemented *See Row Information* Yes  Treat  MEWS Interventions Escalated (See documentation below)  Pain Scale 0-10  Pain Score 2  Pain Type Chronic pain  Pain Location Foot (Has hx of gout)  Pain Orientation Left  Pain Descriptors / Indicators Aching  Pain Frequency Intermittent  Pain Onset With Activity  Patients Stated Pain Goal 2  Pain Intervention(s) Repositioned  Multiple Pain Sites No  Take Vital Signs  Increase Vital Sign Frequency  Yellow: Q 2hr X 2 then Q 4hr X 2, if remains yellow, continue Q 4hrs  Escalate  MEWS: Escalate Yellow: discuss with charge nurse/RN and consider discussing with provider and RRT  Notify: Charge Nurse/RN  Name of Charge Nurse/RN Notified Celso  Date Charge Nurse/RN Notified 03/03/20  Time Charge Nurse/RN Notified 2200  Notify: Provider  Provider Name/Title Dr Adela Glimpse  Date Provider Notified 03/03/20  Time Provider Notified 2222  Notification Type Page  Notification Reason Change in status  Provider response No new orders  Date of Provider Response 03/03/20  Time of Provider Response 2227  Document  Patient Outcome Other (Comment) (Heart rate elevates when pt moves/w/ activity)  Progress note created (see row info) Yes

## 2020-03-03 NOTE — H&P (Addendum)
Drew [name] NOT:[id number] DOB: 01-Nov-1960 DOA: 03/03/2020   PCP: Esperanza Richters, PA-C   Outpatient Specialists:  CARDS:  Dr. Servando Salina   Patient arrived to ER on 03/03/20 at 1001 Referred by Attending Norins, Rosalyn Gess, MD  Patient coming from: home Lives  With family  Chief Complaint:  Chief Complaint  Patient presents with  . Shortness of Breath  . Hypoxia    HPI: Drew [name] is a 60 y.o. male with medical history significant of  nonischemic cardiomyopathy EF on echo 20 to 25% in March 2021,heart failure with reduced ejection fraction, tobacco use, hyperlipidemia and hypertension, pulmonary hypertension, morbid obesity    Presented with   worsening shortness of breath with minimal exertion and noted to be hypoxic at home also endorsing pleuritic chest pain, SOB for the past 3 days, no leg edema. Reports decreased appetite for the past few days, had little to eat, he have had a bit of water Reports left foot pain similar to prior gaut  He has still been taking his home meds including TORSEMIDE Have not been smoking for the past 2 years Occasional etoh  He was seen in cardiology office today was noted to be hypoxic down to 89% room air. At the office physical exam not consistent fluid overload he was sent to emergency department. Patient denies any recent surgery no travel no active cancer. No Covid exposure no current cough fever chills  Infectious risk factors:  Reports  shortness of breath     Has    been vaccinated against COVID not boosted    Initial COVID TEST  NEGATIVE   Lab Results  Component Value Date   SARSCOV2NAA NEGATIVE 03/03/2020    Regarding pertinent Chronic problems:    Hyperlipidemia -not on  statins Lipid Panel     Component Value Date/Time   CHOL 152 05/25/2018 0343   TRIG 69 05/25/2018 0343   HDL 36 (L) 05/25/2018 0343   CHOLHDL 4.2 05/25/2018 0343   VLDL 14 05/25/2018 0343   LDLCALC 102 (H) 05/25/2018 0343     HTN on Coreg 12.5 mg  twice daily, Aldactone 12.5 mg daily, Entresto 24-26 mg twice daily    chronic CHF systolic  - last echo EF 20 to 25% in March 2021, Aldactone 12.5 mg daily, Entresto 24-26 mg twice daily and  Farxiga. Torsemide    Morbid obesity-   BMI Readings from Last 1 Encounters:  03/03/20 51.37 kg/m       Chronic anemia - baseline hg Hemoglobin & Hematocrit  Recent Labs    06/11/19 1059 06/30/19 1039 03/03/20 1032  HGB 12.6* 11.6* 12.9*    While in ER: Noted to have hypokalemia down to 3.1 AKI with creatinine up to 1.4 CTA showed PE he was started on heparin   Hospitalist was called for admission for pulmonary embolism  The following Work up has been ordered so far:  Orders Placed This Encounter  Procedures  . Critical Care  . Resp Panel by RT-PCR (Flu A&B, Covid) Nasopharyngeal Swab  . DG Chest Port 1 View  . CT Angio Chest PE W and/or Wo Contrast  . Basic metabolic panel  . CBC  . Protime-INR  . Heparin level (unfractionated)  . Heparin level (unfractionated)  . CBC  . Document Height and Actual Weight  . Consult to hospitalist  ALL PATIENTS BEING ADMITTED/HAVING PROCEDURES NEED COVID-19 SCREENING  . heparin per pharmacy consult  . Airborne and Contact precautions  . EKG 12-Lead  .  Admit to Inpatient (patient's expected length of stay will be greater than 2 midnights or inpatient only procedure)    Following Medications were ordered in ER: Medications  heparin ADULT infusion 100 units/mL (25000 units/266mL) (1,800 Units/hr Intravenous Restarted 03/03/20 1443)  iohexol (OMNIPAQUE) 350 MG/ML injection 100 mL (100 mLs Intravenous Contrast Given 03/03/20 1310)  heparin bolus via infusion 6,500 Units (6,500 Units Intravenous Bolus from Bag 03/03/20 1441)  heparin 62229 UT/250ML infusion (  Return to [facility] 03/03/20 1458)  potassium chloride SA (KLOR-CON) CR tablet 40 mEq (40 mEq Oral Given 03/03/20 1511)        Consult Orders  (From admission, onward)         Start      Ordered   03/03/20 1332  Consult to hospitalist  ALL PATIENTS BEING ADMITTED/HAVING PROCEDURES NEED COVID-19 SCREENING Called [facility] spoke with Cala Bradford  Once       Comments: ALL PATIENTS BEING ADMITTED/HAVING PROCEDURES NEED COVID-19 SCREENING  Provider:  (Not yet assigned)  Question Answer Comment  Place call to: [facility]   Reason for Consult Admit      03/03/20 1331          Significant initial  Findings: Abnormal Labs Reviewed  BASIC METABOLIC PANEL - Abnormal; Notable for the following components:      Result Value   Potassium 3.1 (*)    Chloride 97 (*)    Glucose, Bld 122 (*)    BUN 28 (*)    Creatinine, Ser 1.44 (*)    GFR, Estimated 57 (*)    All other components within normal limits  CBC - Abnormal; Notable for the following components:   Hemoglobin 12.9 (*)    All other components within normal limits    Otherwise labs showing:  Recent Labs  Lab 03/03/20 1032  NA 137  K 3.1*  CO2 28  GLUCOSE 122*  BUN 28*  CREATININE 1.44*  CALCIUM 9.3    Cr  Up from baseline see below Lab Results  Component Value Date   CREATININE 1.44 (H) 03/03/2020   CREATININE 1.11 02/05/2020   CREATININE 1.04 12/15/2019    No results for input(s): AST, ALT, ALKPHOS, BILITOT, PROT, ALBUMIN in the last 168 hours. Lab Results  Component Value Date   CALCIUM 9.3 03/03/2020   WBC        Component Value Date/Time   WBC 9.1 03/03/2020 1032   LYMPHSABS 1.5 06/30/2019 1039   MONOABS 0.3 06/30/2019 1039   EOSABS 0.3 06/30/2019 1039   BASOSABS 0.0 06/30/2019 1039    Plt: Lab Results  Component Value Date   PLT 352 03/03/2020    Lactic Acid, Venous    Component Value Date/Time   LATICACIDVEN 1.3 03/03/2020 1032    HG/HCT stable,      Component Value Date/Time   HGB 12.9 (L) 03/03/2020 1032   HCT 39.1 03/03/2020 1032   MCV 91.6 03/03/2020 1032    Troponin 11-11     ECG: Ordered Personally reviewed by me showing: HR : 129 Rhythm:   Sinus tachycardia    Nonspecific intraventricular conduction delay Repol abnrm suggests ischemia, lateral leads ST depression in lateral leads QTC 490   BNP (last 3 results) Recent Labs    11/13/19 1456  BNP 8         UA  Ordered       CXR  NON acute    CTA chest - positive for pulmonary emboli. There is no evidence of right heart  strain but wedge-shaped opacities in the right middle and lower lobes are worrisome for pulmonary infarcts.      ED Triage Vitals  Enc Vitals Group     BP 03/03/20 1029 109/69     Pulse Rate 03/03/20 1029 (!) 130     Resp 03/03/20 1029 (!) 25     Temp 03/03/20 1029 99.7 F (37.6 C)     Temp Source 03/03/20 1029 Oral     SpO2 03/03/20 1029 93 %     Weight 03/03/20 1020 (!) 358 lb (162.4 kg)     Height 03/03/20 1020 5\' 10"  (1.778 m)     Head Circumference --      Peak Flow --      Pain Score 03/03/20 1020 0     Pain Loc --      Pain Edu? --      Excl. in GC? --   TMAX(24)@       Latest  Blood pressure (!) 117/59, pulse (!) 108, temperature 98.4 F (36.9 C), resp. rate 18, height 5\' 10"  (1.778 m), weight (!) 162.4 kg, SpO2 96 %.     Review of Systems:    Pertinent positives include:  fatigue  Constitutional:  No weight loss, night sweats, Fevers, chills,, weight loss  HEENT:  No headaches, Difficulty swallowing,Tooth/dental problems,Sore throat,  No sneezing, itching, ear ache, nasal congestion, post nasal drip,  Cardio-vascular:  No chest pain, Orthopnea, PND, anasarca, dizziness, palpitations.no Bilateral lower extremity swelling  GI:  No heartburn, indigestion, abdominal pain, nausea, vomiting, diarrhea, change in bowel habits, loss of appetite, melena, blood in stool, hematemesis Resp:  no shortness of breath at rest. No dyspnea on exertion, No excess mucus, no productive cough, No non-productive cough, No coughing up of blood.No change in color of mucus.No wheezing. Skin:  no rash or lesions. No jaundice GU:  no dysuria, change in color of  urine, no urgency or frequency. No straining to urinate.  No flank pain.  Musculoskeletal:  No joint pain or no joint swelling. No decreased range of motion. No back pain.  Psych:  No change in mood or affect. No depression or anxiety. No memory loss.  Neuro: no localizing neurological complaints, no tingling, no weakness, no double vision, no gait abnormality, no slurred speech, no confusion  All systems reviewed and apart from HOPI all are negative  Past Medical History:   Past Medical History:  Diagnosis Date  . Allergy   . Dilated cardiomyopathy (HCC)   . HTN (hypertension)   . Hyperlipidemia 05/24/2018     Past Surgical History:  Procedure Laterality Date  . KNEE ARTHROSCOPY  2009  . RIGHT/LEFT HEART CATH AND CORONARY ANGIOGRAPHY N/A 05/26/2018   Procedure: RIGHT/LEFT HEART CATH AND CORONARY ANGIOGRAPHY;  Surgeon: 2010, MD;  Location: [facility] INVASIVE [facility];  Service: Cardiovascular;  Laterality: N/A;  . THROAT SURGERY  2005    Social History:  Ambulatory  independently       reports that he quit smoking about 21 months ago. His smoking use included cigarettes. He has a 20.00 pack-year smoking history. He has never used smokeless tobacco. He reports current alcohol use. He reports that he does not use drugs.  Family History:  Family History  Problem Relation Age of Onset  . Hearing loss Mother   . Cancer Father   . Heart failure Other   . Heart failure Paternal Grandfather     Allergies: Allergies  Allergen Reactions  . Tetracyclines &  Related Hives and Itching     Prior to Admission medications   Medication Sig Start Date End Date Taking? Authorizing Provider  allopurinol (ZYLOPRIM) 100 MG tablet TAKE 1 TABLET(100 MG) BY MOUTH DAILY 03/03/20  Yes Saguier, Ramon DredgeEdward, PA-C  carvedilol (COREG) 12.5 MG tablet Take 1 tablet (12.5 mg total) by mouth 2 (two) times daily with a meal. 11/20/19  Yes Tobb, Kardie, DO  ENTRESTO 24-26 MG TAKE 1 TABLET BY MOUTH TWICE  DAILY 01/11/20  Yes Tobb, Kardie, DO  spironolactone (ALDACTONE) 25 MG tablet Take 0.5 tablets (12.5 mg total) by mouth daily. 11/20/19  Yes Tobb, Kardie, DO  torsemide (DEMADEX) 20 MG tablet Take 80 mg (4 tablets) in the morning and 20 mg (1 tablets) in the evening 04/01/19  Yes Jodelle GrossLawrence, Kathryn M, NP  albuterol (PROVENTIL HFA;VENTOLIN HFA) 108 (90 Base) MCG/ACT inhaler Inhale 2 puffs into the lungs every 6 (six) hours as needed for wheezing or shortness of breath. 11/29/17   Saguier, Ramon DredgeEdward, PA-C   Physical Exam: Vitals with BMI 03/03/2020 03/03/2020 03/03/2020  Height - - -  Weight - - -  BMI - - -  Systolic 117 108 [id number]  Diastolic 59 59 79  Pulse 108 65 63   1. General:  in No Acute distress  Chronically ill appearing 2. Psychological: Alert and  Oriented 3. Head/ENT:    Dry Mucous Membranes                          Head Non traumatic, neck supple                       Poor Dentition 4. SKIN:  decreased Skin turgor,  Skin clean Dry and intact no rash 5. Heart: Regular rate and rhythm  systolic Murmur, no Rub or gallop 6. Lungs:  no wheezes or crackles   7. Abdomen: Soft,  non-tender, Non distended   Obese  bowel sounds present 8. Lower extremities: no clubbing, cyanosis, obese  9. Neurologically Grossly intact, moving all 4 extremities equally   10. MSK: Normal range of motion   All other LABS:     Recent Labs  Lab 03/03/20 1032  WBC 9.1  HGB 12.9*  HCT 39.1  MCV 91.6  PLT 352     Recent Labs  Lab 03/03/20 1032  NA 137  K 3.1*  CL 97*  CO2 28  GLUCOSE 122*  BUN 28*  CREATININE 1.44*  CALCIUM 9.3     No results for input(s): AST, ALT, ALKPHOS, BILITOT, PROT, ALBUMIN in the last 168 hours.     Cultures: No results found for: SDES, SPECREQUEST, CULT, REPTSTATUS   Radiological Exams on Admission: CT Angio Chest PE W and/or Wo Contrast  Result Date: 03/03/2020 CLINICAL DATA:  Shortness of breath for 4 days. EXAM: CT ANGIOGRAPHY CHEST WITH CONTRAST TECHNIQUE:  Multidetector CT imaging of the chest was performed using the standard protocol during bolus administration of intravenous contrast. Multiplanar CT image reconstructions and MIPs were obtained to evaluate the vascular anatomy. CONTRAST:  100 mL OMNIPAQUE IOHEXOL 350 MG/ML SOLN COMPARISON:  Single-view of the chest today. FINDINGS: Cardiovascular: Pulmonary emboli are seen in interlobar pulmonary artery branches bilaterally. There is no evidence of right heart strain. Mild cardiomegaly. No pericardial effusion. Mediastinum/Nodes: Small bilateral mediastinal and hilar lymph nodes are identified. No pathologic lymphadenopathy by CT size criteria. 0.9 cm low attenuating lesion in the right lobe of the thyroid is  noted. Not clinically significant; no follow-up imaging recommended (ref: J Am Coll Radiol. 2015 Feb;12(2): 143-50). Lungs/Pleura: Small bilateral pleural effusions. Wedge-shaped opacities are seen in the right lower and right middle lobes worrisome for pulmonary infarcts. There is also some dependent atelectasis. Upper Abdomen: There is fatty infiltration of the liver. Left renal cyst is partially imaged. Musculoskeletal: No acute or focal abnormality. Review of the MIP images confirms the above findings. IMPRESSION: The examination is positive for pulmonary emboli. There is no evidence of right heart strain but wedge-shaped opacities in the right middle and lower lobes are worrisome for pulmonary infarcts. Very small bilateral pleural effusions. Cardiomegaly. Fatty infiltration of the liver. Critical Value/emergent results were called by telephone at the time of interpretation on 03/03/2020 at 1:27 pm to provider [facility] , who verbally acknowledged these results. Electronically Signed   By: Drusilla Kanner M.D.   On: 03/03/2020 13:31   DG Chest Port 1 View  Result Date: 03/03/2020 CLINICAL DATA:  Shortness of breath. EXAM: PORTABLE CHEST 1 VIEW COMPARISON:  PA and lateral chest 11/13/2019. FINDINGS:  Lungs clear. Heart size upper normal. No pneumothorax or pleural fluid. No acute or focal bony abnormality. IMPRESSION: No acute disease. Electronically Signed   By: Drusilla Kanner M.D.   On: 03/03/2020 11:03    Chart has been reviewed    Assessment/Plan   60 y.o. male with medical history significant of  nonischemic cardiomyopathy EF on echo 20 to 25% in March 2021,heart failure with reduced ejection fraction, tobacco use, hyperlipidemia and hypertension, pulmonary hypertension, morbid obesity  Admitted for PE  Present on Admission: . Pulmonary embolism (HCC) -  Admit to   Telemetry  Initiate heparin drip  Would likely benefit from case manager consult for long term anticoagulation Hold home blood pressure medications avoid hypotension   cardiac enzymes -trop neg X2 Order echogram and lower extremity Dopplers  Most likely risk factors for hypercoagulable state being  obesity  edentary lifestyle    persistently tachycardiac BP SP >100 Pt with hx of CHF Would avoid over aggressive fluid over load Monitor on progressive  . Tobacco abuse in remission  Possible gout -vs DVT if neg for DVT can start on colchicine   . Obesity, Class III, BMI 40-49.9 (morbid obesity) (HCC) we will need to have follow-up with nutrition as an outpatient  . Hyperlipidemia chronic stable  . Essential hypertension permissive hypertension for tonight Resume home meds when BP allows  . Chronic systolic CHF (congestive heart failure) (HCC) -chronic appears to be slightly on the dry side hold Entresto and torsemide for tonight resume when able  . Hypokalemia repeat potassium and check magnesium replete if needed  . AKI (acute kidney injury) (HCC) -obtain urine electrolytes hold torsemide for tonight hold Entresto for the night.  Repeat kidney function   Other plan as per orders.  DVT prophylaxis:  heparin     Code Status:    Code Status: Prior FULL CODE  as per patient    I had personally discussed  CODE STATUS with patient     Family Communication:   Family not at  Bedside   Disposition Plan:    To home once workup is complete and patient is stable   Following barriers for discharge:                            Electrolytes corrected  Will likely need home health, home O2, set up                                     Transition of care consulted                                     Consults called: none   Admission status:  ED Disposition    ED Disposition Condition Comment   Admit  [facility]ospital [facility] [[id number]]  Level of Care: Med-Surg [16]  May admit patient to [facility] or [facility] if equivalent level of care is available:: Yes  Interfacility transfer: Yes  Covid Evaluation: Asymptomatic Screening Protocol (No Symptoms)  Diagnosis: Pulmonary embolism (HCC) [241700]  Admitting Physician: Jacques Navy [[id number]]  Attending Physician: Illene Regulus E [[id number]]  Estimated length of stay: past midnight tomorrow  Certification:: I certify this patient will need inpatient services for at least 2 midnights        inpatient     I Expect 2 midnight stay secondary to severity of patient's current illness need for inpatient interventions justified by the following: hemodynamic instability despite optimal treatment hypoxia,) Severe lab/radiological/exam abnormalities including:    PE found and extensive comorbidities including: CHF Morbid Obesity  That are currently affecting medical management.   I expect  patient to be hospitalized for 2 midnights requiring inpatient medical care.  Patient is at high risk for adverse outcome (such as loss of life or disability) if not treated.  Indication for inpatient stay as follows:  Severe change from baseline regarding mental status Hemodynamic instability despite maximal medical therapy,  ongoing suicidal ideations,  severe  pain requiring acute inpatient management,  inability to maintain oral hydration   persistent chest pain despite medical management Need for operative/procedural  intervention New or worsening hypoxia  Need for IV antibiotics, IV fluids, IV rate controling medications, IV antihypertensives, IV pain medications, IV anticoagulation    Level of care  progressive  indefinitely please discontinue once patient no longer qualifies COVID-19 Labs    Lab Results  Component Value Date   SARSCOV2NAA NEGATIVE 03/03/2020     Precautions: admitted as Covid Negative  PPE: Used by the provider:   N95 eye Goggles,  Gloves   Kerry-Anne Mezo 03/04/2020, 2:33 AM    [facility]     after 2 AM please page floor coverage PA If 7AM-7PM, please contact the day team taking care of the patient using [email or web address]   Patient was evaluated in the context of the global COVID-19 pandemic, which necessitated consideration that the patient might be at risk for infection with the SARS-CoV-2 virus that causes COVID-19. Institutional protocols and algorithms that pertain to the evaluation of patients at risk for COVID-19 are in a state of rapid change based on information released by regulatory bodies including the CDC and federal and state organizations. These policies and algorithms were followed during the patient's care.

## 2020-03-03 NOTE — Progress Notes (Signed)
ANTICOAGULATION CONSULT NOTE  Pharmacy Consult for heparin Indication: pulmonary embolus  Allergies  Allergen Reactions  . Tetracyclines & Related Hives and Itching    Patient Measurements: Height: 5\' 10"  (177.8 cm) Weight: (!) 162.4 kg (358 lb) IBW/kg (Calculated) : 73 Heparin Dosing Weight: 112 kg  Vital Signs: Temp: 98.7 F (37.1 C) (02/17 2202) Temp Source: Oral (02/17 1827) BP: 138/89 (02/17 2202) Pulse Rate: 80 (02/17 2202)  Labs: Recent Labs    03/03/20 1032 03/03/20 1229 03/03/20 2032  HGB 12.9*  --   --   HCT 39.1  --   --   PLT 352  --   --   LABPROT 13.8  --   --   INR 1.1  --   --   HEPARINUNFRC  --   --  0.13*  CREATININE 1.44*  --   --   TROPONINIHS 11 11  --     Estimated Creatinine Clearance: 87.1 mL/min (A) (by C-G formula based on SCr of 1.44 mg/dL (H)).   Medical History: Past Medical History:  Diagnosis Date  . AKI (acute kidney injury) (HCC) 03/03/2020  . Allergy   . Dilated cardiomyopathy (HCC)   . HTN (hypertension)   . Hyperlipidemia 05/24/2018    Medications:  Scheduled:  . sodium chloride flush  3 mL Intravenous Q12H    Assessment: 59 yom presenting with acute PE with no RHS, continuing on heparin drip. Initial heparin level low at 0.13. Hg 12.9, plt wnl. Noted AKI - SCr 1.44. No bleeding or issues with infusion per discussion with RN. Patient is not on anticoagulation PTA.  Goal of Therapy:  Heparin level 0.3-0.7 units/ml Monitor platelets by anticoagulation protocol: Yes   Plan:  Heparin 3300 unit bolus x 1 Increase heparin infusion to 2200 units/hr Recheck heparin level in 6 hrs Monitor daily CBC, s/sx bleeding   07/24/2018, PharmD, BCPS Please check AMION for all  [facility] contact numbers Clinical Pharmacist 03/03/2020 10:26 PM

## 2020-03-03 NOTE — ED Notes (Signed)
Pt O2 noted to be 87%, O2 was not on after pt returned from CT. Placed back on O2 via [place]. Placed on 4L while pt sleeping as his O2 would stay around 86-89% on 2L

## 2020-03-03 NOTE — ED Notes (Signed)
Report given to nurse at [facility] receiving patient

## 2020-03-03 NOTE — Progress Notes (Signed)
ANTICOAGULATION CONSULT NOTE - Initial Consult  Pharmacy Consult for heparin Indication: pulmonary embolus  Allergies  Allergen Reactions  . Tetracyclines & Related Hives and Itching    Patient Measurements: Height: 5\' 10"  (177.8 cm) Weight: (!) 162.4 kg (358 lb) IBW/kg (Calculated) : 73 Heparin Dosing Weight: 112 kg  Vital Signs: Temp: 99.7 F (37.6 C) (02/17 1029) Temp Source: Oral (02/17 1029) BP: 121/76 (02/17 1400) Pulse Rate: 107 (02/17 1400)  Labs: Recent Labs    03/03/20 1032 03/03/20 1229  HGB 12.9*  --   HCT 39.1  --   PLT 352  --   CREATININE 1.44*  --   TROPONINIHS 11 11    Estimated Creatinine Clearance: 87.1 mL/min (A) (by C-G formula based on SCr of 1.44 mg/dL (H)).   Medical History: Past Medical History:  Diagnosis Date  . Allergy   . Dilated cardiomyopathy (HCC)   . HTN (hypertension)   . Hyperlipidemia 05/24/2018    Medications:  Scheduled:  . heparin  6,500 Units Intravenous Once    Assessment: PE verified by scan, with worsening HF and increased SCr/BUN  Goal of Therapy:  Heparin level 0.3-0.7 units/ml Monitor platelets by anticoagulation protocol: Yes   Plan:  Give 6500 units bolus x 1 Start heparin infusion at 1800 units/hr Check anti-Xa level in 6 hours and daily while on heparin Continue to monitor H&H and platelets  07/24/2018 A Cleon Signorelli 03/03/2020,2:13 PM

## 2020-03-03 NOTE — ED Triage Notes (Signed)
Patient stated that shortness of breath started Monday.  He can only take sips of water.   He was seen by his PCP and was sent here for d/t low O2 saturation.  Difficult to take a deep breath.

## 2020-03-03 NOTE — ED Notes (Signed)
Report given to [facility]. Attempted to call receiving nurse at [facility], secretary states busy in room, left number to call back for report.

## 2020-03-03 NOTE — Progress Notes (Signed)
Called back to receive report and no answer.

## 2020-03-03 NOTE — ED Notes (Signed)
Pt offered a drink or snack, states he wants ice chips. Ice chips provided. Wife at bedside, updated on bed status at [facility]

## 2020-03-03 NOTE — ED Notes (Signed)
Pt oxygen level dropped to 88% on 2L, instructed to deep breathe through nose without improvement.. Increased O2 to 3L with improvement to 96%

## 2020-03-03 NOTE — Patient Instructions (Signed)
Admitted to ED  Your next appointment:   1 month(s)  The format for your next appointment:   In Person  Provider:   Thomasene Ripple, DO   Other Instructions

## 2020-03-03 NOTE — ED Notes (Signed)
Pt provided with ice chips with Dr. Rhunette Croft approval

## 2020-03-03 NOTE — ED Provider Notes (Signed)
[facility] EMERGENCY DEPARTMENT Provider Note   CSN: [id number] Arrival date & time: 03/03/20  1001     History Chief Complaint  Patient presents with   Shortness of Breath   Hypoxia    Drew [name] is a 60 y.o. male.  HPI    60 year old male comes in a chief complaint of shortness of breath and low oxygen saturation. Patient has history of dilated cardiomyopathy, CHF, hypertension and hyperlipidemia. He was seen in the cardiology clinic today. Patient was noted to be hypoxic. He is complaining of some chest pain. Cardiology team noticed that patient's diuresing has been successful and he has increased weight loss, and since he is having these discomfort despite that they advised him to come to the ER.  Pt has no hx of PE, DVT and denies any exogenous hormone (testosterone / estrogen) use, long distance travels or surgery in the past 6 weeks, active cancer, recent immobilization.   Past Medical History:  Diagnosis Date   Allergy    Dilated cardiomyopathy (HCC)    HTN (hypertension)    Hyperlipidemia 05/24/2018    Patient Active Problem List   Diagnosis Date Noted   Pulmonary embolism (HCC) 03/03/2020   Allergy    Dilated cardiomyopathy (HCC)    HTN (hypertension)    Nonischemic cardiomyopathy (HCC) 11/20/2019   HFrEF (heart failure with reduced ejection fraction) (HCC) 11/20/2019   Acute systolic HF (heart failure) (HCC) 06/02/2018   Essential hypertension 05/24/2018   Hyperlipidemia 05/24/2018   Tobacco abuse 05/24/2018   Alcohol use 05/24/2018   Obesity, Class III, BMI 40-49.9 (morbid obesity) (HCC) 05/24/2018   Acute CHF (congestive heart failure) (HCC) 05/24/2018   New onset of congestive heart failure (HCC) 05/23/2018   Wellness examination 02/26/2014   Knee pain, left 02/11/2014   Seasonal allergies 02/11/2014    Past Surgical History:  Procedure Laterality Date   KNEE ARTHROSCOPY  2009   RIGHT/LEFT HEART CATH AND  CORONARY ANGIOGRAPHY N/A 05/26/2018   Procedure: RIGHT/LEFT HEART CATH AND CORONARY ANGIOGRAPHY;  Surgeon: Lyn Records, MD;  Location: [facility] INVASIVE [facility];  Service: Cardiovascular;  Laterality: N/A;   THROAT SURGERY  2005       Family History  Problem Relation Age of Onset   Hearing loss Mother    Cancer Father    Heart failure Other    Heart failure Paternal Grandfather     Social History   Tobacco Use   Smoking status: Former Smoker    Packs/day: 1.00    Years: 20.00    Pack years: 20.00    Types: Cigarettes    Quit date: 05/23/2018    Years since quitting: 1.7   Smokeless tobacco: Never Used  Substance Use Topics   Alcohol use: Yes    Alcohol/week: 0.0 standard drinks    Comment: 6 pack a week spread out over weekend   Drug use: No    Home Medications Prior to Admission medications   Medication Sig Start Date End Date Taking? Authorizing Provider  allopurinol (ZYLOPRIM) 100 MG tablet TAKE 1 TABLET(100 MG) BY MOUTH DAILY 03/03/20  Yes Saguier, Ramon Dredge, PA-C  carvedilol (COREG) 12.5 MG tablet Take 1 tablet (12.5 mg total) by mouth 2 (two) times daily with a meal. 11/20/19  Yes Tobb, Kardie, DO  ENTRESTO 24-26 MG TAKE 1 TABLET BY MOUTH TWICE DAILY 01/11/20  Yes Tobb, Kardie, DO  spironolactone (ALDACTONE) 25 MG tablet Take 0.5 tablets (12.5 mg total) by mouth daily. 11/20/19  Yes Tobb,  Kardie, DO  torsemide (DEMADEX) 20 MG tablet Take 80 mg (4 tablets) in the morning and 20 mg (1 tablets) in the evening 04/01/19  Yes Jodelle Gross, NP  albuterol (PROVENTIL HFA;VENTOLIN HFA) 108 (90 Base) MCG/ACT inhaler Inhale 2 puffs into the lungs every 6 (six) hours as needed for wheezing or shortness of breath. 11/29/17   Saguier, Ramon Dredge, PA-C    Allergies    Tetracyclines & related  Review of Systems   Review of Systems  Constitutional: Positive for activity change.  Respiratory: Positive for shortness of breath.   Cardiovascular: Positive for chest pain.   Gastrointestinal: Negative for nausea and vomiting.  Neurological: Negative for dizziness.  All other systems reviewed and are negative.   Physical Exam Updated Vital Signs BP 121/76    Pulse (!) 107    Temp 99.7 F (37.6 C) (Oral)    Resp 19    Ht 5\' 10"  (1.778 m)    Wt (!) 162.4 kg    SpO2 97%    BMI 51.37 kg/m   Physical Exam Vitals and nursing note reviewed.  Constitutional:      Appearance: He is well-developed.  HENT:     Head: Atraumatic.  Cardiovascular:     Rate and Rhythm: Normal rate.  Pulmonary:     Effort: Pulmonary effort is normal.     Breath sounds: No decreased breath sounds, wheezing, rhonchi or rales.  Musculoskeletal:     Cervical back: Neck supple.     Right lower leg: No tenderness.     Left lower leg: No tenderness.  Skin:    General: Skin is warm.  Neurological:     Mental Status: He is alert and oriented to person, place, and time.     ED Results / Procedures / Treatments   Labs (all labs ordered are listed, but only abnormal results are displayed) Labs Reviewed  BASIC METABOLIC PANEL - Abnormal; Notable for the following components:      Result Value   Potassium 3.1 (*)    Chloride 97 (*)    Glucose, Bld 122 (*)    BUN 28 (*)    Creatinine, Ser 1.44 (*)    GFR, Estimated 57 (*)    All other components within normal limits  CBC - Abnormal; Notable for the following components:   Hemoglobin 12.9 (*)    All other components within normal limits  RESP PANEL BY RT-PCR (FLU A&B, COVID) ARPGX2  LACTIC ACID, PLASMA  PROTIME-INR  HEPARIN LEVEL (UNFRACTIONATED)  TROPONIN I (HIGH SENSITIVITY)  TROPONIN I (HIGH SENSITIVITY)    EKG EKG Interpretation  Date/Time:  Thursday March 03 2020 10:20:55 EST Ventricular Rate:  129 PR Interval:    QRS Duration: 159 QT Interval:  334 QTC Calculation: 490 R Axis:   27 Text Interpretation: Sinus or ectopic atrial tachycardia Prolonged PR interval Nonspecific intraventricular conduction delay  Repol abnrm suggests ischemia, lateral leads ST depression in lateral leads Confirmed by [id number] Derwood Kaplan) on 03/03/2020 11:22:26 AM   Radiology CT Angio Chest PE W and/or Wo Contrast  Result Date: 03/03/2020 CLINICAL DATA:  Shortness of breath for 4 days. EXAM: CT ANGIOGRAPHY CHEST WITH CONTRAST TECHNIQUE: Multidetector CT imaging of the chest was performed using the standard protocol during bolus administration of intravenous contrast. Multiplanar CT image reconstructions and MIPs were obtained to evaluate the vascular anatomy. CONTRAST:  100 mL OMNIPAQUE IOHEXOL 350 MG/ML SOLN COMPARISON:  Single-view of the chest today. FINDINGS: Cardiovascular: Pulmonary emboli are seen  in interlobar pulmonary artery branches bilaterally. There is no evidence of right heart strain. Mild cardiomegaly. No pericardial effusion. Mediastinum/Nodes: Small bilateral mediastinal and hilar lymph nodes are identified. No pathologic lymphadenopathy by CT size criteria. 0.9 cm low attenuating lesion in the right lobe of the thyroid is noted. Not clinically significant; no follow-up imaging recommended (ref: J Am Coll Radiol. 2015 Feb;12(2): 143-50). Lungs/Pleura: Small bilateral pleural effusions. Wedge-shaped opacities are seen in the right lower and right middle lobes worrisome for pulmonary infarcts. There is also some dependent atelectasis. Upper Abdomen: There is fatty infiltration of the liver. Left renal cyst is partially imaged. Musculoskeletal: No acute or focal abnormality. Review of the MIP images confirms the above findings. IMPRESSION: The examination is positive for pulmonary emboli. There is no evidence of right heart strain but wedge-shaped opacities in the right middle and lower lobes are worrisome for pulmonary infarcts. Very small bilateral pleural effusions. Cardiomegaly. Fatty infiltration of the liver. Critical Value/emergent results were called by telephone at the time of interpretation on 03/03/2020 at  1:27 pm to provider [facility] , who verbally acknowledged these results. Electronically Signed   By: Drusilla Kanner M.D.   On: 03/03/2020 13:31   DG Chest Port 1 View  Result Date: 03/03/2020 CLINICAL DATA:  Shortness of breath. EXAM: PORTABLE CHEST 1 VIEW COMPARISON:  PA and lateral chest 11/13/2019. FINDINGS: Lungs clear. Heart size upper normal. No pneumothorax or pleural fluid. No acute or focal bony abnormality. IMPRESSION: No acute disease. Electronically Signed   By: Drusilla Kanner M.D.   On: 03/03/2020 11:03    Procedures .Critical Care Performed by: Derwood Kaplan, MD Authorized by: Derwood Kaplan, MD   Critical care provider statement:    Critical care time (minutes):  52   Critical care was necessary to treat or prevent imminent or life-threatening deterioration of the following conditions:  Respiratory failure   Critical care was time spent personally by me on the following activities:  Discussions with consultants, evaluation of patient's response to treatment, examination of patient, ordering and performing treatments and interventions, ordering and review of laboratory studies, ordering and review of radiographic studies, pulse oximetry, re-evaluation of patient's condition, obtaining history from patient or surrogate and review of old charts     Medications Ordered in ED Medications  heparin ADULT infusion 100 units/mL (25000 units/212mL) (1,800 Units/hr Intravenous Restarted 03/03/20 1443)  iohexol (OMNIPAQUE) 350 MG/ML injection 100 mL (100 mLs Intravenous Contrast Given 03/03/20 1310)  heparin bolus via infusion 6,500 Units (6,500 Units Intravenous Bolus from Bag 03/03/20 1441)  heparin 66294 UT/250ML infusion (  Return to [facility] 03/03/20 1458)    ED Course  I have reviewed the triage vital signs and the nursing notes.  Pertinent labs & imaging results that were available during my care of the patient were reviewed by me and considered in my medical decision  making (see chart for details).    MDM Rules/Calculators/A&P                          Pt comes in with cc of shortness of breath. He had slight hypoxia in the cardiology clinic today. Patient normally not on oxygen. He is advanced CHF, but he has been diuresing well.  On history, patient suggest that he is having some pleuritic chest pain. No recent COVID-19 exposure and no current cough, fevers, chills.  Suspicion is high for PE therefore we have proceeded directly to CT angiogram. It confirms  that patient has PE with pulmonary infarct, no right-sided heart strain.  We will start patient on heparin and admit him to the hospital. Results discussed with the patient.  Final Clinical Impression(s) / ED Diagnoses Final diagnoses:  Acute pulmonary embolism, unspecified pulmonary embolism type, unspecified whether acute cor pulmonale present [facility])    Rx / DC Orders ED Discharge Orders    None       Derwood Kaplan, MD 03/03/20 1504

## 2020-03-04 ENCOUNTER — Inpatient Hospital Stay (HOSPITAL_COMMUNITY): Payer: Federal, State, Local not specified - PPO

## 2020-03-04 DIAGNOSIS — I2602 Saddle embolus of pulmonary artery with acute cor pulmonale: Secondary | ICD-10-CM

## 2020-03-04 DIAGNOSIS — I2699 Other pulmonary embolism without acute cor pulmonale: Principal | ICD-10-CM

## 2020-03-04 DIAGNOSIS — J9601 Acute respiratory failure with hypoxia: Secondary | ICD-10-CM | POA: Diagnosis not present

## 2020-03-04 DIAGNOSIS — I5022 Chronic systolic (congestive) heart failure: Secondary | ICD-10-CM

## 2020-03-04 LAB — COMPREHENSIVE METABOLIC PANEL
ALT: 28 U/L (ref 0–44)
AST: 32 U/L (ref 15–41)
Albumin: 3.5 g/dL (ref 3.5–5.0)
Alkaline Phosphatase: 61 U/L (ref 38–126)
Anion gap: 11 (ref 5–15)
BUN: 19 mg/dL (ref 6–20)
CO2: 26 mmol/L (ref 22–32)
Calcium: 8.9 mg/dL (ref 8.9–10.3)
Chloride: 99 mmol/L (ref 98–111)
Creatinine, Ser: 1.24 mg/dL (ref 0.61–1.24)
GFR, Estimated: >60 mL/min (ref >60)
Glucose, Bld: 108 mg/dL — ABNORMAL HIGH (ref 70–99)
Potassium: 3.1 mmol/L — ABNORMAL LOW (ref 3.5–5.1)
Sodium: 136 mmol/L (ref 135–145)
Total Bilirubin: 1.2 mg/dL (ref 0.3–1.2)
Total Protein: 7.5 g/dL (ref 6.5–8.1)

## 2020-03-04 LAB — HEPARIN LEVEL (UNFRACTIONATED)
Heparin Unfractionated: 0.29 IU/mL — ABNORMAL LOW (ref 0.30–0.70)
Heparin Unfractionated: 0.3 IU/mL (ref 0.30–0.70)
Heparin Unfractionated: 0.33 IU/mL (ref 0.30–0.70)

## 2020-03-04 LAB — MAGNESIUM: Magnesium: 2.5 mg/dL — ABNORMAL HIGH (ref 1.7–2.4)

## 2020-03-04 LAB — BRAIN NATRIURETIC PEPTIDE: B Natriuretic Peptide: 27 pg/mL (ref 0.0–100.0)

## 2020-03-04 LAB — CBC WITH DIFFERENTIAL/PLATELET
Abs Immature Granulocytes: 0.02 10*3/uL (ref 0.00–0.07)
Basophils Absolute: 0.1 10*3/uL (ref 0.0–0.1)
Basophils Relative: 1 %
Eosinophils Absolute: 0.2 10*3/uL (ref 0.0–0.5)
Eosinophils Relative: 3 %
HCT: 38.6 % — ABNORMAL LOW (ref 39.0–52.0)
Hemoglobin: 12.5 g/dL — ABNORMAL LOW (ref 13.0–17.0)
Immature Granulocytes: 0 %
Lymphocytes Relative: 24 %
Lymphs Abs: 1.9 10*3/uL (ref 0.7–4.0)
MCH: 29.7 pg (ref 26.0–34.0)
MCHC: 32.4 g/dL (ref 30.0–36.0)
MCV: 91.7 fL (ref 80.0–100.0)
Monocytes Absolute: 0.9 10*3/uL (ref 0.1–1.0)
Monocytes Relative: 11 %
Neutro Abs: 4.7 10*3/uL (ref 1.7–7.7)
Neutrophils Relative %: 61 %
Platelets: 328 10*3/uL (ref 150–400)
RBC: 4.21 MIL/uL — ABNORMAL LOW (ref 4.22–5.81)
RDW: 13.8 % (ref 11.5–15.5)
WBC: 7.8 10*3/uL (ref 4.0–10.5)
nRBC: 0 % (ref 0.0–0.2)

## 2020-03-04 LAB — ECHOCARDIOGRAM LIMITED
Height: 70 in
S' Lateral: 6 cm
Single Plane A4C EF: 21.3 %
Weight: 5728.01 oz

## 2020-03-04 LAB — PHOSPHORUS: Phosphorus: 3.9 mg/dL (ref 2.5–4.6)

## 2020-03-04 LAB — HIV ANTIBODY (ROUTINE TESTING W REFLEX): HIV Screen 4th Generation wRfx: NONREACTIVE

## 2020-03-04 LAB — TSH: TSH: 12.271 u[IU]/mL — ABNORMAL HIGH (ref 0.350–4.500)

## 2020-03-04 LAB — T4, FREE: Free T4: 0.87 ng/dL (ref 0.61–1.12)

## 2020-03-04 MED ORDER — CARVEDILOL 6.25 MG PO TABS
6.2500 mg | ORAL_TABLET | Freq: Two times a day (BID) | ORAL | Status: DC
  Administered 2020-03-04 – 2020-03-07 (×9): 6.25 mg via ORAL
  Filled 2020-03-04 (×9): qty 1

## 2020-03-04 MED ORDER — DIGOXIN 125 MCG PO TABS
0.1250 mg | ORAL_TABLET | Freq: Every day | ORAL | Status: DC
  Administered 2020-03-04 – 2020-03-09 (×6): 0.125 mg via ORAL
  Filled 2020-03-04 (×6): qty 1

## 2020-03-04 MED ORDER — POTASSIUM CHLORIDE 10 MEQ/100ML IV SOLN
10.0000 meq | INTRAVENOUS | Status: AC
  Administered 2020-03-04 (×4): 10 meq via INTRAVENOUS
  Filled 2020-03-04 (×4): qty 100

## 2020-03-04 MED ORDER — SODIUM CHLORIDE 0.9 % IV BOLUS
500.0000 mL | Freq: Once | INTRAVENOUS | Status: AC
  Administered 2020-03-04: 500 mL via INTRAVENOUS

## 2020-03-04 MED ORDER — AMIODARONE HCL IN DEXTROSE 360-4.14 MG/200ML-% IV SOLN
30.0000 mg/h | INTRAVENOUS | Status: DC
  Administered 2020-03-05 – 2020-03-07 (×6): 30 mg/h via INTRAVENOUS
  Filled 2020-03-04 (×10): qty 200

## 2020-03-04 MED ORDER — AMIODARONE HCL IN DEXTROSE 360-4.14 MG/200ML-% IV SOLN
60.0000 mg/h | INTRAVENOUS | Status: AC
  Administered 2020-03-04 (×2): 60 mg/h via INTRAVENOUS
  Filled 2020-03-04: qty 200

## 2020-03-04 MED ORDER — SPIRONOLACTONE 12.5 MG HALF TABLET
12.5000 mg | ORAL_TABLET | Freq: Every day | ORAL | Status: DC
  Administered 2020-03-04 – 2020-03-06 (×3): 12.5 mg via ORAL
  Filled 2020-03-04 (×3): qty 1

## 2020-03-04 MED ORDER — AMIODARONE LOAD VIA INFUSION
150.0000 mg | Freq: Once | INTRAVENOUS | Status: AC
  Administered 2020-03-04: 150 mg via INTRAVENOUS
  Filled 2020-03-04: qty 83.34

## 2020-03-04 MED ORDER — PERFLUTREN LIPID MICROSPHERE
INTRAVENOUS | Status: AC
  Filled 2020-03-04: qty 10

## 2020-03-04 MED ORDER — PERFLUTREN LIPID MICROSPHERE
1.0000 mL | INTRAVENOUS | Status: AC | PRN
  Administered 2020-03-04: 2 mL via INTRAVENOUS
  Filled 2020-03-04: qty 10

## 2020-03-04 NOTE — Progress Notes (Signed)
  Echocardiogram 2D Echocardiogram has been performed.  Drew [name] 03/04/2020, 10:48 AM

## 2020-03-04 NOTE — Progress Notes (Addendum)
ANTICOAGULATION CONSULT NOTE  Pharmacy Consult for heparin Indication: pulmonary embolus  Allergies  Allergen Reactions  . Tetracyclines & Related Hives and Itching    Patient Measurements: Height: 5\' 10"  (177.8 cm) Weight: (!) 162.4 kg (358 lb) IBW/kg (Calculated) : 73 Heparin Dosing Weight: 112 kg  Vital Signs: Temp: 99 F (37.2 C) (02/18 1934) Temp Source: Oral (02/18 1934) BP: 112/66 (02/18 1934) Pulse Rate: 99 (02/18 2127)  Labs: Recent Labs    03/03/20 1032 03/03/20 1229 03/03/20 2032 03/04/20 0000 03/04/20 0554 03/04/20 1325 03/04/20 2105  HGB 12.9*  --   --  12.5*  --   --   --   HCT 39.1  --   --  38.6*  --   --   --   PLT 352  --   --  328  --   --   --   LABPROT 13.8  --   --   --   --   --   --   INR 1.1  --   --   --   --   --   --   HEPARINUNFRC  --   --    < >  --  0.29* 0.30 0.33  CREATININE 1.44*  --   --  1.24  --   --   --   TROPONINIHS 11 11  --   --   --   --   --    < > = values in this interval not displayed.    Estimated Creatinine Clearance: 101.1 mL/min (by C-G formula based on SCr of 1.24 mg/dL).   Medical History: Past Medical History:  Diagnosis Date  . AKI (acute kidney injury) (HCC) 03/03/2020  . Allergy   . Dilated cardiomyopathy (HCC)   . HTN (hypertension)   . Hyperlipidemia 05/24/2018    Medications:  Scheduled:  . allopurinol  100 mg Oral Daily  . carvedilol  6.25 mg Oral BID WC  . digoxin  0.125 mg Oral Daily  . perflutren lipid microspheres (DEFINITY) IV suspension      . sodium chloride flush  3 mL Intravenous Q12H  . spironolactone  12.5 mg Oral Daily    Assessment: 76 yom presenting with acute PE with no RHS, continuing on heparin drip. Patient is not on anticoagulation PTA.  Heparin level remains low-end therapeutic despite earlier rate increase. CBC stable. No bleeding or issues with infusion per discussion with RN.  Goal of Therapy:  Heparin level 0.3-0.7 units/ml Monitor platelets by anticoagulation  protocol: Yes   Plan:  Increase heparin gtt to 2600 units/hr to target heparin level ~0.5 with acute PE Next heparin level with AM labs Monitor daily CBC, s/sx bleeding F/u ability to transition to DOAC   60, PharmD, BCPS Please check AMION for all [facility] contact numbers Clinical Pharmacist 03/04/2020 10:10 PM

## 2020-03-04 NOTE — Progress Notes (Signed)
Echo reviewed. Discussed results with advanced heart failure team comparing 2/14 echo to today's echo. Both demonstrated reduced RV function. Given the timing of his onset of pleuritic CP, he may have had RV changes on 2/14 from his PE. Since he has not had significant change in RV function since, no biomarker elevation, and PE is several days old, he is less likely to significantly benefit from thrombolytics. If he clinically deteriorates, would reconsider benefits vs risk. At this point will continue to monitor clinically. We will continue to follow.  Steffanie Dunn, DO 03/04/20 3:23 PM [facility]  From 7AM- 7PM if no response to pager, please call [phone]. After hours, 7PM- 7AM, please call Elink  [phone].

## 2020-03-04 NOTE — Progress Notes (Signed)
PROGRESS NOTE    Drew [name]  LTJ:[id number] DOB: [birth year] DOA: 03/03/2020 PCP: Esperanza Richters, PA-C   Brief Narrative: 60 year old with past medical history significant for nonischemic cardiomyopathy ejection fraction 20 to 25% March 2021, heart failure with reduced ejection fraction, tobacco use, hyperlipidemia, hypertension, pulmonary hypertension, morbid obesity presented complaining of worsening shortness of breath with minimal exertion noted to be hypoxic at his cardiologist's office.  He report pleuritic chest pain and shortness of breath for the last 3 days prior to admission.  CT angio: The examination is positive for pulmonary emboli. There is no evidence of right heart strain but wedge-shaped opacities in the right middle and lower lobes are worrisome for pulmonary infarcts. Very small bilateral pleural effusions.   Assessment & Plan:   Active Problems:   Essential hypertension   Hyperlipidemia   Tobacco abuse   Obesity, Class III, BMI 40-49.9 (morbid obesity) (HCC)   Nonischemic cardiomyopathy (HCC)   Dilated cardiomyopathy (HCC)   HTN (hypertension)   Pulmonary embolism (HCC)   Chronic systolic CHF (congestive heart failure) (HCC)   Hypokalemia   AKI (acute kidney injury) (HCC)   1-Bilateral PE, Pulmonary infract.  Currently on 3 L oxygen tachycardic.  Continue with heparin.  ECHO, doppler LE ordered.  Pulmonary consulted.   2-Acute hypoxic Respiratory failure; secondary to PE.  continue with oxygen supplementation.  Continue with heparin.   3-Hypokalemia; replete orally.   4-Elevated TSH;  Check Free T 3 and T 4.   5-Class III obesity; needs weight loss.   HTN; hold diuretics.   Chronic systolic Heart failure exacerbation:  Hold entresto and torsemide to avoid hypotension.  Plan to resume in 1 or 2 days.   AKI; holding diuretics.  Improved cr down to 1.2   Estimated body mass index is 51.37 kg/m as calculated from the following:   Height as  of this encounter: 5\' 10"  (1.778 m).   Weight as of this encounter: 162.4 kg.   DVT prophylaxis: Heparin gtt Code Status: Full code Family Communication: wife was on phone during my evaluation  Disposition Plan:  Status is: Inpatient  Remains inpatient appropriate because:IV treatments appropriate due to intensity of illness or inability to take PO   Dispo: The patient is from: Home              Anticipated d/c is to: Home              Anticipated d/c date is: 3 days              Patient currently is not medically stable to d/c.   Difficult to place patient No        Consultants:   Pulmonary   Procedures:   ECHO  Doppler  Antimicrobials:    Subjective: He is breathing better than on admission. He was having chest pain after coughing.    Objective: Vitals:   03/04/20 0549 03/04/20 0600 03/04/20 0700 03/04/20 0759  BP:    (!) 127/95  Pulse: (!) 107 (!) 107 (!) 111 74  Resp: 19 19 17 14   Temp:    98.7 F (37.1 C)  TempSrc:    Oral  SpO2: 95% 95% 93% 91%  Weight:      Height:        Intake/Output Summary (Last 24 hours) at 03/04/2020 0900 Last data filed at 03/04/2020 0800 Gross per 24 hour  Intake 990.01 ml  Output 750 ml  Net 240.01 ml   03/06/2020  03/03/20 1020  Weight: (!) 162.4 kg    Examination:  General exam: Appears calm and comfortable  Respiratory system: Clear to auscultation. Respiratory effort normal. Cardiovascular system: S1 & S2 heard, RRR. No JVD, murmurs, rubs, gallops or clicks. No pedal edema. Gastrointestinal system: Abdomen is nondistended, soft and nontender. No organomegaly or masses felt. Normal bowel sounds heard. Central nervous system: Alert and oriented.  Extremities: Symmetric 5 x 5 power.  Data Reviewed: I have personally reviewed following labs and imaging studies  CBC: Recent Labs  Lab 03/03/20 1032 03/04/20 0000  WBC 9.1 7.8  NEUTROABS  --  4.7  HGB 12.9* 12.5*  HCT 39.1 38.6*  MCV 91.6 91.7  PLT  352 328   Basic Metabolic Panel: Recent Labs  Lab 03/03/20 1032 03/04/20 0000  NA 137 136  K 3.1* 3.1*  CL 97* 99  CO2 28 26  GLUCOSE 122* 108*  BUN 28* 19  CREATININE 1.44* 1.24  CALCIUM 9.3 8.9  MG  --  2.5*  PHOS  --  3.9   GFR: Estimated Creatinine Clearance: 101.1 mL/min (by C-G formula based on SCr of 1.24 mg/dL). Liver Function Tests: Recent Labs  Lab 03/04/20 0000  AST 32  ALT 28  ALKPHOS 61  BILITOT 1.2  PROT 7.5  ALBUMIN 3.5   No results for input(s): LIPASE, AMYLASE in the last 168 hours. No results for input(s): AMMONIA in the last 168 hours. Coagulation Profile: Recent Labs  Lab 03/03/20 1032  INR 1.1   Cardiac Enzymes: No results for input(s): CKTOTAL, CKMB, CKMBINDEX, TROPONINI in the last 168 hours. BNP (last 3 results) Recent Labs    03/16/19 1026 06/30/19 1039  PROBNP 39.0 45.0   HbA1C: No results for input(s): HGBA1C in the last 72 hours. CBG: No results for input(s): GLUCAP in the last 168 hours. Lipid Profile: No results for input(s): CHOL, HDL, LDLCALC, TRIG, CHOLHDL, LDLDIRECT in the last 72 hours. Thyroid Function Tests: Recent Labs    03/04/20 0000  TSH 12.271*   Anemia Panel: No results for input(s): VITAMINB12, FOLATE, FERRITIN, TIBC, IRON, RETICCTPCT in the last 72 hours. Sepsis Labs: Recent Labs  Lab 03/03/20 1032  LATICACIDVEN 1.3    Recent Results (from the past 240 hour(s))  Resp Panel by RT-PCR (Flu A&B, Covid) Nasopharyngeal Swab     Status: None   Collection Time: 03/03/20 12:37 PM   Specimen: Nasopharyngeal Swab; Nasopharyngeal(NP) swabs in vial transport medium  Result Value Ref Range Status   SARS Coronavirus 2 by RT PCR NEGATIVE NEGATIVE Final    Comment: (NOTE) SARS-CoV-2 target nucleic acids are NOT DETECTED.  The SARS-CoV-2 RNA is generally detectable in upper respiratory specimens during the acute phase of infection. The lowest concentration of SARS-CoV-2 viral copies this assay can detect  is 138 copies/mL. A negative result does not preclude SARS-Cov-2 infection and should not be used as the sole basis for treatment or other patient management decisions. A negative result may occur with  improper specimen collection/handling, submission of specimen other than nasopharyngeal swab, presence of viral mutation(s) within the areas targeted by this assay, and inadequate number of viral copies(<138 copies/mL). A negative result must be combined with clinical observations, patient history, and epidemiological information. The expected result is Negative.  Fact Sheet for Patients:  BloggerCourse.com  Fact Sheet for Healthcare Providers:  SeriousBroker.it  This test is no t yet approved or cleared by the [place] FDA and  has been authorized for detection and/or diagnosis of SARS-CoV-2  by FDA under an Emergency Use Authorization (EUA). This EUA will remain  in effect (meaning this test can be used) for the duration of the COVID-19 declaration under Section 564(b)(1) of the Act, 21 U.S.C.section 360bbb-3(b)(1), unless the authorization is terminated  or revoked sooner.       Influenza A by PCR NEGATIVE NEGATIVE Final   Influenza B by PCR NEGATIVE NEGATIVE Final    Comment: (NOTE) The Xpert Xpress SARS-CoV-2/FLU/RSV plus assay is intended as an aid in the diagnosis of influenza from Nasopharyngeal swab specimens and should not be used as a sole basis for treatment. Nasal washings and aspirates are unacceptable for Xpert Xpress SARS-CoV-2/FLU/RSV testing.  Fact Sheet for Patients: BloggerCourse.com  Fact Sheet for Healthcare Providers: SeriousBroker.it  This test is not yet approved or cleared by the [place] FDA and has been authorized for detection and/or diagnosis of SARS-CoV-2 by FDA under an Emergency Use Authorization (EUA). This EUA will remain in effect  (meaning this test can be used) for the duration of the COVID-19 declaration under Section 564(b)(1) of the Act, 21 U.S.C. section 360bbb-3(b)(1), unless the authorization is terminated or revoked.  Performed at [facility], [address], [place], [place] [address]          Radiology Studies: CT Angio Chest PE W and/or Wo Contrast  Result Date: 03/03/2020 CLINICAL DATA:  Shortness of breath for 4 days. EXAM: CT ANGIOGRAPHY CHEST WITH CONTRAST TECHNIQUE: Multidetector CT imaging of the chest was performed using the standard protocol during bolus administration of intravenous contrast. Multiplanar CT image reconstructions and MIPs were obtained to evaluate the vascular anatomy. CONTRAST:  100 mL OMNIPAQUE IOHEXOL 350 MG/ML SOLN COMPARISON:  Single-view of the chest today. FINDINGS: Cardiovascular: Pulmonary emboli are seen in interlobar pulmonary artery branches bilaterally. There is no evidence of right heart strain. Mild cardiomegaly. No pericardial effusion. Mediastinum/Nodes: Small bilateral mediastinal and hilar lymph nodes are identified. No pathologic lymphadenopathy by CT size criteria. 0.9 cm low attenuating lesion in the right lobe of the thyroid is noted. Not clinically significant; no follow-up imaging recommended (ref: J Am Coll Radiol. 2015 Feb;12(2): 143-50). Lungs/Pleura: Small bilateral pleural effusions. Wedge-shaped opacities are seen in the right lower and right middle lobes worrisome for pulmonary infarcts. There is also some dependent atelectasis. Upper Abdomen: There is fatty infiltration of the liver. Left renal cyst is partially imaged. Musculoskeletal: No acute or focal abnormality. Review of the MIP images confirms the above findings. IMPRESSION: The examination is positive for pulmonary emboli. There is no evidence of right heart strain but wedge-shaped opacities in the right middle and lower lobes are worrisome for pulmonary infarcts. Very small bilateral  pleural effusions. Cardiomegaly. Fatty infiltration of the liver. Critical Value/emergent results were called by telephone at the time of interpretation on 03/03/2020 at 1:27 pm to provider [facility] , who verbally acknowledged these results. Electronically Signed   By: Drusilla Kanner M.D.   On: 03/03/2020 13:31   DG Chest Port 1 View  Result Date: 03/03/2020 CLINICAL DATA:  Shortness of breath. EXAM: PORTABLE CHEST 1 VIEW COMPARISON:  PA and lateral chest 11/13/2019. FINDINGS: Lungs clear. Heart size upper normal. No pneumothorax or pleural fluid. No acute or focal bony abnormality. IMPRESSION: No acute disease. Electronically Signed   By: Drusilla Kanner M.D.   On: 03/03/2020 11:03        Scheduled Meds: . allopurinol  100 mg Oral Daily  . carvedilol  6.25 mg Oral BID WC  .  sodium chloride flush  3 mL Intravenous Q12H   Continuous Infusions: . sodium chloride    . heparin 2,400 Units/hr (03/04/20 0800)     LOS: 1 day    Time spent:35 minutes     Belkys A Regalado, MD [facility]   If 7PM-7AM, please contact night-coverage [email or web address]  03/04/2020, 9:00 AM

## 2020-03-04 NOTE — Plan of Care (Signed)
  Problem: Education: Goal: Knowledge of General Education information will improve Description Including pain rating scale, medication(s)/side effects and non-pharmacologic comfort measures Outcome: Progressing   

## 2020-03-04 NOTE — Progress Notes (Signed)
ANTICOAGULATION CONSULT NOTE  Pharmacy Consult for heparin Indication: pulmonary embolus  Allergies  Allergen Reactions  . Tetracyclines & Related Hives and Itching    Patient Measurements: Height: 5\' 10"  (177.8 cm) Weight: (!) 162.4 kg (358 lb) IBW/kg (Calculated) : 73 Heparin Dosing Weight: 112 kg  Vital Signs: Temp: 99.2 F (37.3 C) (02/18 1112) Temp Source: Oral (02/18 1112) BP: 104/84 (02/18 1112) Pulse Rate: 99 (02/18 1112)  Labs: Recent Labs    03/03/20 1032 03/03/20 1229 03/03/20 2032 03/04/20 0000 03/04/20 0554 03/04/20 1325  HGB 12.9*  --   --  12.5*  --   --   HCT 39.1  --   --  38.6*  --   --   PLT 352  --   --  328  --   --   LABPROT 13.8  --   --   --   --   --   INR 1.1  --   --   --   --   --   HEPARINUNFRC  --   --  0.13*  --  0.29* 0.30  CREATININE 1.44*  --   --  1.24  --   --   TROPONINIHS 11 11  --   --   --   --     Estimated Creatinine Clearance: 101.1 mL/min (by C-G formula based on SCr of 1.24 mg/dL).   Medical History: Past Medical History:  Diagnosis Date  . AKI (acute kidney injury) (HCC) 03/03/2020  . Allergy   . Dilated cardiomyopathy (HCC)   . HTN (hypertension)   . Hyperlipidemia 05/24/2018    Medications:  Scheduled:  . allopurinol  100 mg Oral Daily  . carvedilol  6.25 mg Oral BID WC  . perflutren lipid microspheres (DEFINITY) IV suspension      . sodium chloride flush  3 mL Intravenous Q12H    Assessment: 59 yom presenting with acute PE with no RHS, continuing on heparin drip. Patient is not on anticoagulation PTA.  Heparin level low end therapeutic s/p rate increase to 2400 units/hr  Goal of Therapy:  Heparin level 0.3-0.7 units/ml Monitor platelets by anticoagulation protocol: Yes   Plan:  Increase heparin gtt to 2500 units/hr F/u 6 hour heparin level F/u ability to transition to DOAC  07/24/2018, PharmD Clinical Pharmacist Please check AMION for all [facility] numbers 03/04/2020 2:30 PM

## 2020-03-04 NOTE — Consult Note (Signed)
NAME:  Drew [name], MRN:  [id number], DOB:  02-23-1960, LOS: 1 ADMISSION DATE:  03/03/2020, CONSULTATION DATE:  03/04/20 REFERRING MD:  Derry Skill, CHIEF COMPLAINT:  Submassive PE   Brief History:  PE, normal RV on CT scan, no hypotension or syncope  History of Present Illness:  Drew [name] is a 60 y/o gentleman with a history of HFrEF 2/2 NICM who presented to the hospital on 2/18 after being sent from his Cardiologist's office for evaluation of dyspnea. Due to a history of CM he thought his dyspnea that was worse than baseline was attributed to his heart. Upon exam in the office, he was not felt to be significantly hypervolemic, but was hypoxic and tachycardic. He developed bilateral pain in his anterior chest beneath his lower ribs that was sharp and worse with breathing and coughing. This began suddenly 4 days ago. No syncope or presyncopal symptoms. No recent travel, prolonged immobility, or surgery. No personal or family history of cancer. No other concerning symptoms recently. No bleeding. Has not had a colonoscopy in the past.   Past Medical History:  HFrEF due to NICM Gout HTN HLD  Significant Hospital Events:    Consults:  PCCM  Procedures:    Significant Diagnostic Tests:  CTA> multiple Pes, peripheral opacities in RML, RLL- possible infarcts. Echo>pending  BNP 27 Trop 11, 11  Micro Data:  covid negative  Antimicrobials:    Interim History / Subjective:    Objective   Blood pressure (!) 127/95, pulse 74, temperature 98.7 F (37.1 C), temperature source Oral, resp. rate 14, height 5\' 10"  (1.778 m), weight (!) 162.4 kg, SpO2 91 %.        Intake/Output Summary (Last 24 hours) at 03/04/2020 0934 Last data filed at 03/04/2020 0800 Gross per 24 hour  Intake 990.01 ml  Output 750 ml  Net 240.01 ml   Filed Weights   03/03/20 1020  Weight: (!) 162.4 kg    Examination: General: middle aged man sitting up in bed talking on the phone in NAD HENT: [place]/AT,  eyes anicteric Lungs: CTAB, breathing comfortably on [place] Cardiovascular: tachycardic, reg rhythm, sinus rhythm on tele. Abdomen: obese, soft, NT Extremities: no c/c, no significant ankle edema Neuro: awake and alert, answering questions appropriately, moving all extremities Derm: warm, dry, no rashes   Resolved Hospital Problem list     Assessment & Plan:  Acute respiratory failure with hypoxia due to acute bilateral PEs. No biomarker elevation. Awaiting final echo to determine if there is RV strain. No high risk features such as presyncope. Likely pulmonary infarcts peripherally causing pleuritic chest pain. PE appears unprovoked. -Con't tele monitoring -Echo pending -Agree with anticoagulation. At this point I would recommend against escalating therapy to include thrombolytics given his VS stability since admission and lack of biomarker elevation or high risk symptoms. If he had deterioration in his hemodynamic stability, I would reconsider thrombolytics. -Would recommend discharge on DOAC. Needs 3 months uninterrupted AC. With unprovoked clot, he likely requires indefinite AC.  -Recommend age- appropriate cancer screening. He needs a c-scope once he is stable to hold [facility].   Best practice (evaluated daily)  Per primary  Goals of Care:   Code Status: full  Labs   CBC: Recent Labs  Lab 03/03/20 1032 03/04/20 0000  WBC 9.1 7.8  NEUTROABS  --  4.7  HGB 12.9* 12.5*  HCT 39.1 38.6*  MCV 91.6 91.7  PLT 352 328    Basic Metabolic Panel: Recent Labs  Lab 03/03/20 1032  03/04/20 0000  NA 137 136  K 3.1* 3.1*  CL 97* 99  CO2 28 26  GLUCOSE 122* 108*  BUN 28* 19  CREATININE 1.44* 1.24  CALCIUM 9.3 8.9  MG  --  2.5*  PHOS  --  3.9   GFR: Estimated Creatinine Clearance: 101.1 mL/min (by C-G formula based on SCr of 1.24 mg/dL). Recent Labs  Lab 03/03/20 1032 03/04/20 0000  WBC 9.1 7.8  LATICACIDVEN 1.3  --     Liver Function Tests: Recent Labs  Lab 03/04/20 0000   AST 32  ALT 28  ALKPHOS 61  BILITOT 1.2  PROT 7.5  ALBUMIN 3.5   No results for input(s): LIPASE, AMYLASE in the last 168 hours. No results for input(s): AMMONIA in the last 168 hours.  ABG    Component Value Date/Time   PHART 7.344 (L) 05/26/2018 0915   PCO2ART 51.1 (H) 05/26/2018 0915   PO2ART 81.0 (L) 05/26/2018 0915   HCO3 29.5 (H) 05/26/2018 0918   TCO2 31 05/26/2018 0918   O2SAT 74.0 05/26/2018 0918     Coagulation Profile: Recent Labs  Lab 03/03/20 1032  INR 1.1    Cardiac Enzymes: No results for input(s): CKTOTAL, CKMB, CKMBINDEX, TROPONINI in the last 168 hours.  HbA1C: Hgb A1c MFr Bld  Date/Time Value Ref Range Status  05/26/2018 03:02 AM 5.3 4.8 - 5.6 % Final    Comment:    (NOTE) Pre diabetes:          5.7%-6.4% Diabetes:              >6.4% Glycemic control for   <7.0% adults with diabetes     CBG: No results for input(s): GLUCAP in the last 168 hours.  Review of Systems:   Review of Systems  Constitutional: Negative for chills and fever.  HENT: Negative.   Respiratory: Positive for shortness of breath. Negative for cough.   Cardiovascular: Positive for chest pain. Negative for leg swelling.  Gastrointestinal: Negative for heartburn, nausea and vomiting.  Genitourinary: Negative.   Musculoskeletal: Negative for falls and myalgias.  Skin: Negative for rash.  Neurological: Negative for loss of consciousness and weakness.     Past Medical History:  He,  has a past medical history of AKI (acute kidney injury) (HCC) (03/03/2020), Allergy, Dilated cardiomyopathy (HCC), HTN (hypertension), and Hyperlipidemia (05/24/2018).   Surgical History:   Past Surgical History:  Procedure Laterality Date  . KNEE ARTHROSCOPY  2009  . RIGHT/LEFT HEART CATH AND CORONARY ANGIOGRAPHY N/A 05/26/2018   Procedure: RIGHT/LEFT HEART CATH AND CORONARY ANGIOGRAPHY;  Surgeon: Lyn Records, MD;  Location: [facility] INVASIVE CV LAB;  Service: Cardiovascular;  Laterality: N/A;   . THROAT SURGERY  2005     Social History:   reports that he quit smoking about 21 months ago. His smoking use included cigarettes. He has a 20.00 pack-year smoking history. He has never used smokeless tobacco. He reports current alcohol use. He reports that he does not use drugs.   Family History:  His family history includes Cancer in his father; Hearing loss in his mother; Heart failure in his paternal grandfather and another family member.   Allergies Allergies  Allergen Reactions  . Tetracyclines & Related Hives and Itching     Home Medications  Prior to Admission medications   Medication Sig Start Date End Date Taking? Authorizing Provider  albuterol (PROVENTIL HFA;VENTOLIN HFA) 108 (90 Base) MCG/ACT inhaler Inhale 2 puffs into the lungs every 6 (six) hours as needed  for wheezing or shortness of breath. 11/29/17  Yes Saguier, Ramon Dredge, PA-C  allopurinol (ZYLOPRIM) 100 MG tablet TAKE 1 TABLET(100 MG) BY MOUTH DAILY Patient taking differently: Take 100 mg by mouth daily. 03/03/20  Yes Saguier, Ramon Dredge, PA-C  carvedilol (COREG) 12.5 MG tablet Take 1 tablet (12.5 mg total) by mouth 2 (two) times daily with a meal. 11/20/19  Yes Tobb, Kardie, DO  ENTRESTO 24-26 MG TAKE 1 TABLET BY MOUTH TWICE DAILY 01/11/20  Yes Tobb, Kardie, DO  spironolactone (ALDACTONE) 25 MG tablet Take 0.5 tablets (12.5 mg total) by mouth daily. 11/20/19  Yes Tobb, Kardie, DO  torsemide (DEMADEX) 20 MG tablet Take 80 mg (4 tablets) in the morning and 20 mg (1 tablets) in the evening Patient taking differently: Take 20-80 mg by mouth See admin instructions. Take 80 mg (4 tablets) in the morning and 20 mg (1 tablets) in the evening 04/01/19  Yes Jodelle Gross, NP     Steffanie Dunn, DO 03/04/20 12:34 PM [facility]  From 7AM- 7PM if no response to pager, please call [phone]. After hours, 7PM- 7AM, please call Elink  [phone].

## 2020-03-04 NOTE — Progress Notes (Signed)
ANTICOAGULATION CONSULT NOTE - Follow Up Consult  Pharmacy Consult for heparin Indication: pulmonary embolus  Labs: Recent Labs    03/03/20 1032 03/03/20 1229 03/03/20 2032 03/04/20 0000 03/04/20 0554  HGB 12.9*  --   --  12.5*  --   HCT 39.1  --   --  38.6*  --   PLT 352  --   --  328  --   LABPROT 13.8  --   --   --   --   INR 1.1  --   --   --   --   HEPARINUNFRC  --   --  0.13*  --  0.29*  CREATININE 1.44*  --   --  1.24  --   TROPONINIHS 11 11  --   --   --     Assessment: 59yo male remains subtherapeutic on heparin after rate change though now closer to goal; no gtt issues or signs of bleeding per RN.  Goal of Therapy:  Heparin level 0.3-0.7 units/ml   Plan:  Will increase heparin gtt by 10% to 2400 units/hr and check level in 6 hours.    Vernard Gambles, PharmD, BCPS  03/04/2020,7:00 AM

## 2020-03-04 NOTE — Progress Notes (Signed)
Bilateral lower extremity venous duplex has been completed. Preliminary results can be found in CV Proc through chart review.  Results were given to the patient's nurse, Eric.  03/04/20 2:02 PM Olen Cordial RVT

## 2020-03-04 NOTE — Progress Notes (Signed)
Received pt from 6N, handoff from [facility]. Pt in stable condition, a/o x4, no c/o pain, vital sign WNL with exception of HR sustained in 110s-120s. Pt on 3LNC, dyspneic on exertion.

## 2020-03-04 NOTE — Progress Notes (Signed)
0235 Pt HR sustained on 130's, Md is with pt right now. Ordered to give 500cc NS bolus, 4 runs of K+ and to restart Cavedilol as per home dose. Dr Adela Glimpse placed order to transfer pt to [facility]. Will transfer pt when bed available.

## 2020-03-04 NOTE — Consult Note (Addendum)
Advanced Heart Failure Team Consult Note   Primary Physician: Esperanza Richters, PA-C PCP-Cardiologist:  Thomasene Ripple, DO  Reason for Consultation: RV dysfunction in setting of acute PE   HPI:    Drew [name] is seen today for evaluation of RV Dysfunction in setting of acute PE at the request of Dr. Chestine Spore, PCCM.   60 y/o AAM w/ h/o HTN, HLD, obesity, severe knee OA, tobacco use and NICM, first diagnosed in 05/2018 when admitted for acute CHF. Echo showed LVEF 20-25%. RV was moderately reduced. LHC showed normal cors. Has not had cMRI.   Had recent 2D echo on 2/14 showing EF 25-30%. RV mild-moderately reduced.   Seen at [facility] yesterday w/ complaints of 4 day h/o increased dyspnea w/ hypoxia and pleuritic chest pain. Noted to be in sinus tach w/ HR in the 130s. Given concern for PE, he was referred to [facility] ED where CT angio confirmed Rt middle and lower lobe PE. No evidence of RV strain. Admitted and started on IV heparin.   LE Venous doppler + for Lt DVT.   Echo done today. RV moderately enlarged w/ mild-moderately reduced systolic function. Not significantly changed from echo 2/14. BNP also normal at 27. He has remained hemodynamically stable.   He is feeling slightly better. Remains in sinus tach 125. SBP 120s. On 3L [place]. O2 stats 96%.   Works for [employer]. Chronically NYHA Class III, confounded by obesity, deconditioning and severe knee OA. No prior h/o blood clots.    Limited Echo 03/04/20 1. Left ventricular ejection fraction, by estimation, is <20%. The left ventricle has severely decreased function. The left ventricle has no regional wall motion abnormalities. The left ventricular internal cavity size was moderately dilated. There is moderate left ventricular hypertrophy. 2. Right ventricular systolic function is moderately reduced. The right ventricular size is moderately enlarged. There is normal pulmonary artery systolic pressure. 3. Left atrial size was moderately  dilated. 4. Right atrial size was moderately dilated. 5. The mitral valve is normal in structure. No evidence of mitral valve regurgitation. No evidence of mitral stenosis. 6. The aortic valve is normal in structure. Aortic valve regurgitation is not visualized. No aortic stenosis is present. 7. The inferior vena cava is dilated in size with <50% respiratory variability, suggesting right atrial pressure of 15 mmHg.  Review of Systems: [y] = yes, [ ]  = no   . General: Weight gain [ ] ; Weight loss [ ] ; Anorexia [ ] ; Fatigue [ ] ; Fever [ ] ; Chills [ ] ; Weakness [ ]   . Cardiac: Chest pain/pressure [ Y]; Resting SOB [ Y]; Exertional SOB [Y ]; Orthopnea [ ] ; Pedal Edema [ ] ; Palpitations [ ] ; Syncope [ ] ; Presyncope [ ] ; Paroxysmal nocturnal dyspnea[ ]   . Pulmonary: Cough [ ] ; Wheezing[ ] ; Hemoptysis[ ] ; Sputum [ ] ; Snoring [ ]   . GI: Vomiting[ ] ; Dysphagia[ ] ; Melena[ ] ; Hematochezia [ ] ; Heartburn[ ] ; Abdominal pain [ ] ; Constipation [ ] ; Diarrhea [ ] ; BRBPR [ ]   . GU: Hematuria[ ] ; Dysuria [ ] ; Nocturia[ ]   . Vascular: Pain in legs with walking [ ] ; Pain in feet with lying flat [ ] ; Non-healing sores [ ] ; Stroke [ ] ; TIA [ ] ; Slurred speech [ ] ;  . Neuro: Headaches[ ] ; Vertigo[ ] ; Seizures[ ] ; Paresthesias[ ] ;Blurred vision [ ] ; Diplopia [ ] ; Vision changes [ ]   . Ortho/Skin: Arthritis [ ] ; Joint pain [ ] ; Muscle pain [ ] ; Joint swelling [ ] ; Back Pain [ ] ;  Rash [ ]   . Psych: Depression[ ] ; Anxiety[ ]   . Heme: Bleeding problems [ ] ; Clotting disorders [ ] ; Anemia [ ]   . Endocrine: Diabetes [ ] ; Thyroid dysfunction[ ]   Home Medications Prior to Admission medications   Medication Sig Start Date End Date Taking? Authorizing Provider  albuterol (PROVENTIL HFA;VENTOLIN HFA) 108 (90 Base) MCG/ACT inhaler Inhale 2 puffs into the lungs every 6 (six) hours as needed for wheezing or shortness of breath. 11/29/17  Yes Saguier, Ramon Dredge, PA-C  allopurinol (ZYLOPRIM) 100 MG tablet TAKE 1 TABLET(100 MG) BY  MOUTH DAILY Patient taking differently: Take 100 mg by mouth daily. 03/03/20  Yes Saguier, Ramon Dredge, PA-C  carvedilol (COREG) 12.5 MG tablet Take 1 tablet (12.5 mg total) by mouth 2 (two) times daily with a meal. 11/20/19  Yes Tobb, Kardie, DO  ENTRESTO 24-26 MG TAKE 1 TABLET BY MOUTH TWICE DAILY 01/11/20  Yes Tobb, Kardie, DO  spironolactone (ALDACTONE) 25 MG tablet Take 0.5 tablets (12.5 mg total) by mouth daily. 11/20/19  Yes Tobb, Kardie, DO  torsemide (DEMADEX) 20 MG tablet Take 80 mg (4 tablets) in the morning and 20 mg (1 tablets) in the evening Patient taking differently: Take 20-80 mg by mouth See admin instructions. Take 80 mg (4 tablets) in the morning and 20 mg (1 tablets) in the evening 04/01/19  Yes Jodelle Gross, NP    Past Medical History: Past Medical History:  Diagnosis Date  . AKI (acute kidney injury) (HCC) 03/03/2020  . Allergy   . Dilated cardiomyopathy (HCC)   . HTN (hypertension)   . Hyperlipidemia 05/24/2018    Past Surgical History: Past Surgical History:  Procedure Laterality Date  . KNEE ARTHROSCOPY  2009  . RIGHT/LEFT HEART CATH AND CORONARY ANGIOGRAPHY N/A 05/26/2018   Procedure: RIGHT/LEFT HEART CATH AND CORONARY ANGIOGRAPHY;  Surgeon: Lyn Records, MD;  Location: [facility] INVASIVE CV LAB;  Service: Cardiovascular;  Laterality: N/A;  . THROAT SURGERY  2005    Family History: Family History  Problem Relation Age of Onset  . Hearing loss Mother   . Cancer Father   . Heart failure Other   . Heart failure Paternal Grandfather     Social History: Social History   Socioeconomic History  . Marital status: Married    Spouse name: Not on file  . Number of children: Not on file  . Years of education: Not on file  . Highest education level: Not on file  Occupational History  . Not on file  Tobacco Use  . Smoking status: Former Smoker    Packs/day: 1.00    Years: 20.00    Pack years: 20.00    Types: Cigarettes    Quit date: 05/23/2018    Years since  quitting: 1.7  . Smokeless tobacco: Never Used  Substance and Sexual Activity  . Alcohol use: Yes    Alcohol/week: 0.0 standard drinks    Comment: 6 pack a week spread out over weekend  . Drug use: No  . Sexual activity: Yes  Other Topics Concern  . Not on file  Social History Narrative   [employer].   Married lives with wife, mother in law and daughter.  7 children.     Social Determinants of Health   Financial Resource Strain: Not on file  Food Insecurity: Not on file  Transportation Needs: Not on file  Physical Activity: Not on file  Stress: Not on file  Social Connections: Not on file    Allergies:  Allergies  Allergen Reactions  . Tetracyclines & Related Hives and Itching    Objective:    Vital Signs:   Temp:  [98 F (36.7 C)-99.3 F (37.4 C)] 98.6 F (37 C) (02/18 1458) Pulse Rate:  [63-129] 110 (02/18 1458) Resp:  [14-31] 17 (02/18 1458) BP: (102-138)/(59-102) 106/87 (02/18 1458) SpO2:  [91 %-99 %] 95 % (02/18 1458) Last BM Date: 03/02/20  Weight change: Filed Weights   03/03/20 1020  Weight: (!) 162.4 kg    Intake/Output:   Intake/Output Summary (Last 24 hours) at 03/04/2020 1656 Last data filed at 03/04/2020 1600 Gross per 24 hour  Intake 1555.45 ml  Output 1150 ml  Net 405.45 ml      Physical Exam    General:  Well appearing, obese No resp difficulty HEENT: normal Neck: supple. Thick neck, JVD not well visualized . Carotids 2+ bilat; no bruits. No lymphadenopathy or thyromegaly appreciated. Cor: PMI nondisplaced. Regular rhythm tachy rate. No rubs, gallops or murmurs. Lungs: clear Abdomen: soft, nontender, nondistended. No hepatosplenomegaly. No bruits or masses. Good bowel sounds. Extremities: no cyanosis, clubbing, rash, edema Neuro: alert & orientedx3, cranial nerves grossly intact. moves all 4 extremities w/o difficulty. Affect pleasant   Telemetry   Sinus tach 125 bpm   EKG    Sinus tach 129 bpm   Labs   Basic Metabolic  Panel: Recent Labs  Lab 03/03/20 1032 03/04/20 0000  NA 137 136  K 3.1* 3.1*  CL 97* 99  CO2 28 26  GLUCOSE 122* 108*  BUN 28* 19  CREATININE 1.44* 1.24  CALCIUM 9.3 8.9  MG  --  2.5*  PHOS  --  3.9    Liver Function Tests: Recent Labs  Lab 03/04/20 0000  AST 32  ALT 28  ALKPHOS 61  BILITOT 1.2  PROT 7.5  ALBUMIN 3.5   No results for input(s): LIPASE, AMYLASE in the last 168 hours. No results for input(s): AMMONIA in the last 168 hours.  CBC: Recent Labs  Lab 03/03/20 1032 03/04/20 0000  WBC 9.1 7.8  NEUTROABS  --  4.7  HGB 12.9* 12.5*  HCT 39.1 38.6*  MCV 91.6 91.7  PLT 352 328    Cardiac Enzymes: No results for input(s): CKTOTAL, CKMB, CKMBINDEX, TROPONINI in the last 168 hours.  BNP: BNP (last 3 results) Recent Labs    11/13/19 1456 03/04/20 0000  BNP 8 27.0    ProBNP (last 3 results) Recent Labs    03/16/19 1026 06/30/19 1039  PROBNP 39.0 45.0     CBG: No results for input(s): GLUCAP in the last 168 hours.  Coagulation Studies: Recent Labs    03/03/20 1032  LABPROT 13.8  INR 1.1     Imaging   VAS [place] LOWER EXTREMITY VENOUS (DVT)  Result Date: 03/04/2020  Lower Venous DVT Study Indications: Pulmonary embolism.  Risk Factors: Confirmed PE. Anticoagulation: Heparin. Limitations: Body habitus and poor ultrasound/tissue interface. Comparison Study: No prior studies. Performing Technologist: Chanda Busing RVT  Examination Guidelines: A complete evaluation includes B-mode imaging, spectral Doppler, color Doppler, and power Doppler as needed of all accessible portions of each vessel. Bilateral testing is considered an integral part of a complete examination. Limited examinations for reoccurring indications may be performed as noted. The reflux portion of the exam is performed with the patient in reverse Trendelenburg.  +---------+---------------+---------+-----------+----------+--------------+ RIGHT     CompressibilityPhasicitySpontaneityPropertiesThrombus Aging +---------+---------------+---------+-----------+----------+--------------+ CFV      Full           Yes  Yes                                 +---------+---------------+---------+-----------+----------+--------------+ SFJ      Full                                                        +---------+---------------+---------+-----------+----------+--------------+ FV Prox  Full                                                        +---------+---------------+---------+-----------+----------+--------------+ FV Mid   Full                                                        +---------+---------------+---------+-----------+----------+--------------+ FV DistalFull                                                        +---------+---------------+---------+-----------+----------+--------------+ PFV      Full                                                        +---------+---------------+---------+-----------+----------+--------------+ POP      Full           Yes      Yes                                 +---------+---------------+---------+-----------+----------+--------------+ PTV      Full                                                        +---------+---------------+---------+-----------+----------+--------------+ PERO     Full                                                        +---------+---------------+---------+-----------+----------+--------------+   +---------+---------------+---------+-----------+----------+--------------+ LEFT     CompressibilityPhasicitySpontaneityPropertiesThrombus Aging +---------+---------------+---------+-----------+----------+--------------+ CFV      Full           Yes      Yes                                 +---------+---------------+---------+-----------+----------+--------------+ SFJ      Full                                                         +---------+---------------+---------+-----------+----------+--------------+  FV Prox  Full                                                        +---------+---------------+---------+-----------+----------+--------------+ FV Mid   Full                                                        +---------+---------------+---------+-----------+----------+--------------+ FV DistalFull                                                        +---------+---------------+---------+-----------+----------+--------------+ PFV      Full                                                        +---------+---------------+---------+-----------+----------+--------------+ POP      Full           Yes      Yes                                 +---------+---------------+---------+-----------+----------+--------------+ PTV      Full                                                        +---------+---------------+---------+-----------+----------+--------------+ PERO     None                                         Acute          +---------+---------------+---------+-----------+----------+--------------+     Summary: RIGHT: - There is no evidence of deep vein thrombosis in the lower extremity.  - No cystic structure found in the popliteal fossa.  LEFT: - Findings consistent with acute deep vein thrombosis involving the left peroneal veins. - No cystic structure found in the popliteal fossa.  *See table(s) above for measurements and observations.    Preliminary    ECHOCARDIOGRAM LIMITED  Result Date: 03/04/2020    ECHOCARDIOGRAM LIMITED REPORT   Patient Name:   Vanessa DurhamWOODROW Unangst Date of Exam: 03/04/2020 Medical Rec #:  [id number]      Height:       70.0 in Accession #:    [id number]     Weight:       358.0 lb Date of Birth:  09/23/1960       BSA:          2.673 m Patient Age:    59 years       BP:           127/95 mmHg Patient Gender:  M              HR:           113 bpm. Exam  Location:  Inpatient Procedure: Limited Echo, Cardiac Doppler, Color Doppler and Intracardiac            Opacification Agent Indications:    I26.02 Pulmonary embolus  History:        Patient has prior history of Echocardiogram examinations, most                 recent 02/29/2020. CHF; Risk Factors:Hypertension, Diabetes,                 Dyslipidemia and Current Smoker. ETOH.  Sonographer:    Sheralyn Boatman RDCS Referring Phys: [id number] ANASTASSIA DOUTOVA  Sonographer Comments: Technically difficult study due to poor echo windows and patient is morbidly obese. Image acquisition challenging due to patient body habitus. IMPRESSIONS  1. Left ventricular ejection fraction, by estimation, is <20%. The left ventricle has severely decreased function. The left ventricle has no regional wall motion abnormalities. The left ventricular internal cavity size was moderately dilated. There is moderate left ventricular hypertrophy.  2. Right ventricular systolic function is moderately reduced. The right ventricular size is moderately enlarged. There is normal pulmonary artery systolic pressure.  3. Left atrial size was moderately dilated.  4. Right atrial size was moderately dilated.  5. The mitral valve is normal in structure. No evidence of mitral valve regurgitation. No evidence of mitral stenosis.  6. The aortic valve is normal in structure. Aortic valve regurgitation is not visualized. No aortic stenosis is present.  7. The inferior vena cava is dilated in size with <50% respiratory variability, suggesting right atrial pressure of 15 mmHg. Comparison(s): Prior images reviewed side by side. Changes from prior study are noted. FINDINGS  Left Ventricle: Left ventricular ejection fraction, by estimation, is <20%. The left ventricle has severely decreased function. The left ventricle has no regional wall motion abnormalities. Definity contrast agent was given IV to delineate the left ventricular endocardial borders. The left ventricular  internal cavity size was moderately dilated. There is moderate left ventricular hypertrophy. Right Ventricle: The right ventricular size is moderately enlarged. No increase in right ventricular wall thickness. Right ventricular systolic function is moderately reduced. There is normal pulmonary artery systolic pressure. The tricuspid regurgitant velocity is 1.93 m/s, and with an assumed right atrial pressure of 3 mmHg, the estimated right ventricular systolic pressure is 17.9 mmHg. Left Atrium: Left atrial size was moderately dilated. Right Atrium: Right atrial size was moderately dilated. Pericardium: There is no evidence of pericardial effusion. Mitral Valve: The mitral valve is normal in structure. No evidence of mitral valve stenosis. Tricuspid Valve: The tricuspid valve is normal in structure. Tricuspid valve regurgitation is mild . No evidence of tricuspid stenosis. Aortic Valve: The aortic valve is normal in structure. Aortic valve regurgitation is not visualized. No aortic stenosis is present. Pulmonic Valve: The pulmonic valve was normal in structure. Pulmonic valve regurgitation is not visualized. No evidence of pulmonic stenosis. Aorta: The aortic root is normal in size and structure. Venous: The inferior vena cava is dilated in size with less than 50% respiratory variability, suggesting right atrial pressure of 15 mmHg. IAS/Shunts: No atrial level shunt detected by color flow Doppler. LEFT VENTRICLE PLAX 2D LVIDd:         6.60 cm LVIDs:         6.00 cm LV PW:  1.70 cm LV IVS:        1.50 cm LVOT diam:     2.30 cm LVOT Area:     4.15 cm  LV Volumes (MOD) LV vol d, MOD A4C: 136.0 ml LV vol s, MOD A4C: 107.0 ml LV SV MOD A4C:     136.0 ml RIGHT VENTRICLE         IVC TAPSE (M-mode): 1.9 cm  IVC diam: 2.60 cm LEFT ATRIUM         Index      RIGHT ATRIUM           Index LA diam:    4.30 cm 1.61 cm/m RA Area:     24.40 cm                                RA Volume:   82.40 ml  30.82 ml/m   AORTA Ao Root  diam: 3.80 cm TRICUSPID VALVE TR Peak grad:   14.9 mmHg TR Vmax:        193.00 cm/s  SHUNTS Systemic Diam: 2.30 cm Donato Schultz MD Electronically signed by Donato Schultz MD Signature Date/Time: 03/04/2020/2:14:10 PM    Final       Medications:     Current Medications: . allopurinol  100 mg Oral Daily  . carvedilol  6.25 mg Oral BID WC  . perflutren lipid microspheres (DEFINITY) IV suspension      . sodium chloride flush  3 mL Intravenous Q12H     Infusions: . sodium chloride    . heparin 2,500 Units/hr (03/04/20 1600)      Assessment/Plan   1. Acute PE w/ Mild RV Dysfunction + Lt LE DVT - CTA + Rt middle and lower lobe PE but no evidence of RV strain  - hemodynamically stable - has mild chronic RV dysfunction at baseline. Echo today w/ no significant change in baseline RV function compared to previous studies - BNP normal at 27 also arguing against RV strain  - no indication for thrombolytics - LE venous dopplers + for Lt DVT  - on IV heparin, transition to DOAC per IM/PCCM   2. Chronic Systolic Heart Failure - NICM, LHC in 2020 w/ normal cors - Echo 2020 EF 30-35%. RV mildly-moderately reduced  - Echo 02/2020 EF <20%, RV mildly-moderately reduced  - No acute decompensation. Imaging negative for edema. BNP 27  - Chronically NYHA Class III, confounded by obesity, deconditioning and severe knee OA.  - continue Coreg 12.5 mg bid  - continue to hold home Entresto w/ soft BP  - can restart spiro 12.5 mg at bedtime  - can hold home torsemide for now, likely restart tomorrow    3. AKI - baseline Scr ~1.0 - Improving, 1.4>>1.2 - follow BMP   4. Hypokalemia - K 3.1 - received KCl earlier today  - restart spiro 12.5   Length of Stay: 1  Brittainy Simmons, PA-C  03/04/2020, 4:56 PM  Advanced Heart Failure Team Pager [phone] (M-F; 7a - 4p)  Please contact [facility] for night-coverage after hours (4p -7a ) and weekends on [email or web address]  Patient seen with PA, agree with  the above note.   Patient has history of nonischemic cardiomyopathy with echo in the 25% range since 2020.  Cath in 5/20 with no significant disease.  He was doing relatively well and was working full time at the [employer] office until Sunday.  On Sunday, he  developed increased dyspnea and pleuritic chest pain.  He had an echo on Monday showing EF 20%, moderately decreased RV function with moderate RV enlargement on my read.  He then saw Dr. Servando Salina yesterday.  He was still having pleuritic chest pain and was hypoxemic, he was sent to the ER.  CTA chest was done, showing RML and RLL PEs with likely infarction.  There was a DVT on the left on venous dopplers.  Echo today was similar to Monday with EF 20%, moderate RV dilation/moderate RV dysfunction, dilated IVC.  Surprisingly, BNP and HS-TnI were normal.    Currently, patient denies dyspnea and chest pain unless he takes a very deep breath.  He is in atrial flutter rate 110s.  He was in atrial flutter at his echo on Monday.  Atrial flutter has not been seen prior to this week.   General: NAD Neck: JVP 14+ cm, no thyromegaly or thyroid nodule.  Lungs: Clear to auscultation bilaterally with normal respiratory effort. CV: Nondisplaced PMI.  Heart tachy, regular S1/S2, no S3/S4, no murmur. 1+ ankle edema.  No carotid bruit.  Normal pedal pulses.  Abdomen: Soft, nontender, no hepatosplenomegaly, no distention.  Skin: Intact without lesions or rashes.  Neurologic: Alert and oriented x 3.  Psych: Normal affect. Extremities: No clubbing or cyanosis.  HEENT: Normal.   1. Right-sided PE possibly with infarction and LLE DVT: Unprovoked.  Patient has no FH of VTE.  He has not been sedentary, has been working full time at the post office.  Echo in 2/14 with moderate RV dilation/moderate dysfunction on my read, echo today (2/18) with similar appearance of the RV.  The RV looks worse by echo now than it did on previous echo in 3/21.  I think based on symptoms that he had  the PE on 2/13.  Symptoms are minimal at rest currently, BP stable but tachycardic in atrial flutter.  HS-TnI and BNP surprisingly normal.  - Continue heparin gtt, eventual transition to Eliquis.  - He does have RV strain first noted on 2/14 echo, but I  think that the PE occurred 5 days ago, and suspect that thrombolysis is not warranted at this point.  2. Atrial flutter: This was noted first at time of echo on 2/14.  Suspect this was triggered by PE.  He is in RVR with rate 110s-120s.  - Start amiodarone gtt for rate control.  - Continue anticoagulation.   - If he remains in flutter, eventual TEE-guided DCCV.  3. Acute on chronic systolic CHF: Echo from 2/18 was reviewed, shows EF around 20% with moderate RV dysfunction and dilated RV.  Cath from 5/20 with no significant CAD.  Cause of CMP is uncertain, EF has been low since 2020.  NICM.  ?Viral myocarditis.  On exam, he is volume overloaded likely due to a degree of RV failure in setting of PE.   - Can continue Coreg at lower dose 6.25 mg bid.  - Add back spironolactone 12.5 mg daily.  - Add digoxin 0.125 to help with rate control and RV function.  - Will likely start diuresis tomorrow if BP remains stable.  - Eventually restart Entresto if BP remains stable.  - Should have cardiac MRI eventually.   [name] 03/04/2020 6:35 PM

## 2020-03-05 DIAGNOSIS — I5043 Acute on chronic combined systolic (congestive) and diastolic (congestive) heart failure: Secondary | ICD-10-CM

## 2020-03-05 DIAGNOSIS — I2699 Other pulmonary embolism without acute cor pulmonale: Secondary | ICD-10-CM | POA: Diagnosis not present

## 2020-03-05 DIAGNOSIS — J9601 Acute respiratory failure with hypoxia: Secondary | ICD-10-CM | POA: Diagnosis not present

## 2020-03-05 LAB — CBC
HCT: 33 % — ABNORMAL LOW (ref 39.0–52.0)
Hemoglobin: 11.1 g/dL — ABNORMAL LOW (ref 13.0–17.0)
MCH: 30.9 pg (ref 26.0–34.0)
MCHC: 33.6 g/dL (ref 30.0–36.0)
MCV: 91.9 fL (ref 80.0–100.0)
Platelets: 319 10*3/uL (ref 150–400)
RBC: 3.59 MIL/uL — ABNORMAL LOW (ref 4.22–5.81)
RDW: 13.7 % (ref 11.5–15.5)
WBC: 6.5 10*3/uL (ref 4.0–10.5)
nRBC: 0 % (ref 0.0–0.2)

## 2020-03-05 LAB — BASIC METABOLIC PANEL
Anion gap: 13 (ref 5–15)
BUN: 14 mg/dL (ref 6–20)
CO2: 22 mmol/L (ref 22–32)
Calcium: 8.6 mg/dL — ABNORMAL LOW (ref 8.9–10.3)
Chloride: 103 mmol/L (ref 98–111)
Creatinine, Ser: 1.21 mg/dL (ref 0.61–1.24)
GFR, Estimated: >60 mL/min (ref >60)
Glucose, Bld: 109 mg/dL — ABNORMAL HIGH (ref 70–99)
Potassium: 3.7 mmol/L (ref 3.5–5.1)
Sodium: 138 mmol/L (ref 135–145)

## 2020-03-05 LAB — HEPARIN LEVEL (UNFRACTIONATED): Heparin Unfractionated: 0.39 IU/mL (ref 0.30–0.70)

## 2020-03-05 LAB — T3, FREE: T3, Free: 2.2 pg/mL (ref 2.0–4.4)

## 2020-03-05 MED ORDER — SACUBITRIL-VALSARTAN 24-26 MG PO TABS
1.0000 | ORAL_TABLET | Freq: Two times a day (BID) | ORAL | Status: DC
  Administered 2020-03-05 – 2020-03-09 (×9): 1 via ORAL
  Filled 2020-03-05 (×10): qty 1

## 2020-03-05 MED ORDER — FUROSEMIDE 10 MG/ML IJ SOLN
40.0000 mg | Freq: Two times a day (BID) | INTRAMUSCULAR | Status: DC
  Administered 2020-03-05 – 2020-03-06 (×2): 40 mg via INTRAVENOUS
  Filled 2020-03-05 (×2): qty 4

## 2020-03-05 MED ORDER — POTASSIUM CHLORIDE CRYS ER 20 MEQ PO TBCR
40.0000 meq | EXTENDED_RELEASE_TABLET | Freq: Once | ORAL | Status: AC
  Administered 2020-03-05: 40 meq via ORAL
  Filled 2020-03-05: qty 2

## 2020-03-05 MED ORDER — COLCHICINE 0.6 MG PO TABS
0.6000 mg | ORAL_TABLET | Freq: Every day | ORAL | Status: DC
  Administered 2020-03-05 – 2020-03-09 (×5): 0.6 mg via ORAL
  Filled 2020-03-05 (×5): qty 1

## 2020-03-05 MED ORDER — PREDNISONE 20 MG PO TABS
40.0000 mg | ORAL_TABLET | Freq: Every day | ORAL | Status: AC
  Administered 2020-03-05 – 2020-03-07 (×3): 40 mg via ORAL
  Filled 2020-03-05 (×3): qty 2

## 2020-03-05 NOTE — Consult Note (Signed)
NAME:  Drew [name], MRN:  [id number], DOB:  1960-03-24, LOS: 2 ADMISSION DATE:  03/03/2020, CONSULTATION DATE:  03/04/20 REFERRING MD:  Derry Skill, CHIEF COMPLAINT:  Submassive PE   Brief History:  PE, normal RV on CT scan, no hypotension or syncope  History of Present Illness:  [name] is a 60 y/o gentleman with a history of HFrEF 2/2 NICM who presented to the hospital on 2/18 after being sent from his Cardiologist's office for evaluation of dyspnea. Due to a history of CM he thought his dyspnea that was worse than baseline was attributed to his heart. Upon exam in the office, he was not felt to be significantly hypervolemic, but was hypoxic and tachycardic. He developed bilateral pain in his anterior chest beneath his lower ribs that was sharp and worse with breathing and coughing. This began suddenly 4 days ago. No syncope or presyncopal symptoms. No recent travel, prolonged immobility, or surgery. No personal or family history of cancer. No other concerning symptoms recently. No bleeding. Has not had a colonoscopy in the past.   Past Medical History:  HFrEF due to NICM Gout HTN HLD  Significant Hospital Events:    Consults:  PCCM  Procedures:    Significant Diagnostic Tests:  CTA> multiple Pes, peripheral opacities in RML, RLL- possible infarcts. Echo>pending  BNP 27 Trop 11, 11  Micro Data:  covid negative  Antimicrobials:    Interim History / Subjective:  Chest pain has resolved. SOB has improved.   Objective   Blood pressure 127/83, pulse 88, temperature 98.6 F (37 C), temperature source Oral, resp. rate 17, height 5\' 10"  (1.778 m), weight (!) 162.4 kg, SpO2 95 %.        Intake/Output Summary (Last 24 hours) at 03/05/2020 1654 Last data filed at 03/05/2020 0800 Gross per 24 hour  Intake 1241.15 ml  Output 350 ml  Net 891.15 ml   Filed Weights   03/03/20 1020  Weight: (!) 162.4 kg    Examination: General: middle aged man sitting up in bed  talking on the phone in NAD HENT: [place]/AT, eyes anicteric Lungs: CTAB, breathing comfortably on [place] Cardiovascular: tachycardic, reg rhythm, sinus rhythm on tele. Abdomen: obese, soft, NT Extremities: no c/c, no significant ankle edema Neuro: awake and alert, answering questions appropriately, moving all extremities Derm: warm, dry, no rashes   Resolved Hospital Problem list     Assessment & Plan:  Acute respiratory insufficiency with hypoxia due to acute bilateral PEs. No biomarker elevation.No high risk features such as presyncope. Likely pulmonary infarcts peripherally causing pleuritic chest pain. PE appears unprovoked. -Con't tele monitoring -Echo shows LV dysfunction, given the small size of the PE, appears that CHF is the major issue at this point.  -Agree with anticoagulation. At this point I would recommend against escalating therapy to include thrombolytics given his VS stability since admission and lack of biomarker elevation or high risk symptoms. -Would recommend discharge on DOAC. Needs 3 months uninterrupted AC. With unprovoked clot, he likely requires indefinite AC.  -Recommend age- appropriate cancer screening. He needs a c-scope once he is stable to hold [facility].  Will sign-off as [facility] is currently following  Best practice (evaluated daily)  Per primary  Goals of Care:   Code Status: full  Labs   CBC: Recent Labs  Lab 03/03/20 1032 03/04/20 0000 03/05/20 0333  WBC 9.1 7.8 6.5  NEUTROABS  --  4.7  --   HGB 12.9* 12.5* 11.1*  HCT 39.1 38.6* 33.0*  MCV 91.6 91.7 91.9  PLT 352 328 319    Basic Metabolic Panel: Recent Labs  Lab 03/03/20 1032 03/04/20 0000 03/05/20 0333  NA 137 136 138  K 3.1* 3.1* 3.7  CL 97* 99 103  CO2 28 26 22   GLUCOSE 122* 108* 109*  BUN 28* 19 14  CREATININE 1.44* 1.24 1.21  CALCIUM 9.3 8.9 8.6*  MG  --  2.5*  --   PHOS  --  3.9  --    GFR: Estimated Creatinine Clearance: 103.7 mL/min (by C-G formula based on SCr of 1.21  mg/dL). Recent Labs  Lab 03/03/20 1032 03/04/20 0000 03/05/20 0333  WBC 9.1 7.8 6.5  LATICACIDVEN 1.3  --   --     Liver Function Tests: Recent Labs  Lab 03/04/20 0000  AST 32  ALT 28  ALKPHOS 61  BILITOT 1.2  PROT 7.5  ALBUMIN 3.5   No results for input(s): LIPASE, AMYLASE in the last 168 hours. No results for input(s): AMMONIA in the last 168 hours.  ABG    Component Value Date/Time   PHART 7.344 (L) 05/26/2018 0915   PCO2ART 51.1 (H) 05/26/2018 0915   PO2ART 81.0 (L) 05/26/2018 0915   HCO3 29.5 (H) 05/26/2018 0918   TCO2 31 05/26/2018 0918   O2SAT 74.0 05/26/2018 0918     Coagulation Profile: Recent Labs  Lab 03/03/20 1032  INR 1.1    Cardiac Enzymes: No results for input(s): CKTOTAL, CKMB, CKMBINDEX, TROPONINI in the last 168 hours.  HbA1C: Hgb A1c MFr Bld  Date/Time Value Ref Range Status  05/26/2018 03:02 AM 5.3 4.8 - 5.6 % Final    Comment:    (NOTE) Pre diabetes:          5.7%-6.4% Diabetes:              >6.4% Glycemic control for   <7.0% adults with diabetes     CBG: No results for input(s): GLUCAP in the last 168 hours.   07/26/2018, MD 03/05/20 4:54 PM [facility]  From 7AM- 7PM if no response to pager, please call [phone]. After hours, 7PM- 7AM, please call Elink  [phone].

## 2020-03-05 NOTE — Progress Notes (Addendum)
PROGRESS NOTE    Drew DurhamWoodrow Zobrist  WUJ:[id number] DOB: 04/11/1960 DOA: 03/03/2020 PCP: Esperanza RichtersSaguier, Edward, PA-C   Brief Narrative: 60 year old with past medical history significant for nonischemic cardiomyopathy ejection fraction 20 to 25% March 2021, heart failure with reduced ejection fraction, tobacco use, hyperlipidemia, hypertension, pulmonary hypertension, morbid obesity presented complaining of worsening shortness of breath with minimal exertion noted to be hypoxic at his cardiologist's office.  He report pleuritic chest pain and shortness of breath for the last 3 days prior to admission.  CT angio: The examination is positive for pulmonary emboli. There is no evidence of right heart strain but wedge-shaped opacities in the right middle and lower lobes are worrisome for pulmonary infarcts. Very small bilateral pleural effusions.   Assessment & Plan:   Active Problems:   Essential hypertension   Hyperlipidemia   Tobacco abuse   Obesity, Class III, BMI 40-49.9 (morbid obesity) (HCC)   Nonischemic cardiomyopathy (HCC)   Dilated cardiomyopathy (HCC)   HTN (hypertension)   Pulmonary embolism (HCC)   Chronic systolic CHF (congestive heart failure) (HCC)   Hypokalemia   AKI (acute kidney injury) (HCC)   1-Bilateral PE, Pulmonary infract.  Currently on 3 L oxygen. Tachycardia improved on amiodarone.  Continue with heparin. Started on Heparin 2/17. Plan to transition to eliquis.  ECHO, EF 20 %, Moderate reduce RV function.  Pulmonary consulted.  Doppler LE positive for left peroneal DVT.  Plan to check Hypercoagulable panel.  He will need appropriate screening for malignancy for age.   2-Acute hypoxic Respiratory failure; secondary to PE.  continue with oxygen supplementation.  Continue with heparin.   3-Hypokalemia; replaced.   4-Elevated TSH;   Free T 3 and T 4. Normal.  He will need TSH in 4 weeks.  Will start low dose synthroid.   5-Class III obesity; needs weight  loss.   Acute on Chronic systolic Heart failure exacerbation:  Hold entresto and torsemide to avoid hypotension.  Appreciate HF team. Plan to start IV lasix. Started on spironolactone.  He will need cardiac MRI>  Continue coreg, started digoxin.   AKI;  Improved cr down to 1.2  Gout; started on colchicine and prednisone.  A flutter; started on amiodarone Gtt.   Estimated body mass index is 51.37 kg/m as calculated from the following:   Height as of this encounter: 5\' 10"  (1.778 m).   Weight as of this encounter: 162.4 kg.   DVT prophylaxis: Heparin gtt Code Status: Full code Family Communication:  Disposition Plan:  Status is: Inpatient  Remains inpatient appropriate because:IV treatments appropriate due to intensity of illness or inability to take PO   Dispo: The patient is from: Home              Anticipated d/c is to: Home              Anticipated d/c date is: 3 days              Patient currently is not medically stable to d/c.   Difficult to place patient No        Consultants:   Pulmonary   Procedures:   ECHO  Doppler  Antimicrobials:    Subjective: Denies chest pain,. Dyspnea improved.   Objective: Vitals:   03/05/20 0000 03/05/20 0317 03/05/20 0800 03/05/20 0809  BP: (!) 112/92 115/90 (!) 126/103 115/89  Pulse: 95 87  94  Resp: 18 17  20   Temp:  98.8 F (37.1 C)  97.9 F (36.6 C)  TempSrc:  Oral  Axillary  SpO2: 93% 93%  95%  Weight:      Height:        Intake/Output Summary (Last 24 hours) at 03/05/2020 1009 Last data filed at 03/05/2020 0800 Gross per 24 hour  Intake 1431.59 ml  Output 750 ml  Net 681.59 ml   Filed Weights   03/03/20 1020  Weight: (!) 162.4 kg    Examination:  General exam: NAD Respiratory system: No wheezing. l. Cardiovascular system: S 1, S 2 IRR Gastrointestinal system: BS present, soft, nt Central nervous system: alert Extremities: Symmetric power  Data Reviewed: I have personally reviewed  following labs and imaging studies  CBC: Recent Labs  Lab 03/03/20 1032 03/04/20 0000 03/05/20 0333  WBC 9.1 7.8 6.5  NEUTROABS  --  4.7  --   HGB 12.9* 12.5* 11.1*  HCT 39.1 38.6* 33.0*  MCV 91.6 91.7 91.9  PLT 352 328 319   Basic Metabolic Panel: Recent Labs  Lab 03/03/20 1032 03/04/20 0000 03/05/20 0333  NA 137 136 138  K 3.1* 3.1* 3.7  CL 97* 99 103  CO2 28 26 22   GLUCOSE 122* 108* 109*  BUN 28* 19 14  CREATININE 1.44* 1.24 1.21  CALCIUM 9.3 8.9 8.6*  MG  --  2.5*  --   PHOS  --  3.9  --    GFR: Estimated Creatinine Clearance: 103.7 mL/min (by C-G formula based on SCr of 1.21 mg/dL). Liver Function Tests: Recent Labs  Lab 03/04/20 0000  AST 32  ALT 28  ALKPHOS 61  BILITOT 1.2  PROT 7.5  ALBUMIN 3.5   No results for input(s): LIPASE, AMYLASE in the last 168 hours. No results for input(s): AMMONIA in the last 168 hours. Coagulation Profile: Recent Labs  Lab 03/03/20 1032  INR 1.1   Cardiac Enzymes: No results for input(s): CKTOTAL, CKMB, CKMBINDEX, TROPONINI in the last 168 hours. BNP (last 3 results) Recent Labs    03/16/19 1026 06/30/19 1039  PROBNP 39.0 45.0   HbA1C: No results for input(s): HGBA1C in the last 72 hours. CBG: No results for input(s): GLUCAP in the last 168 hours. Lipid Profile: No results for input(s): CHOL, HDL, LDLCALC, TRIG, CHOLHDL, LDLDIRECT in the last 72 hours. Thyroid Function Tests: Recent Labs    03/04/20 0000 03/04/20 0717 03/04/20 0831  TSH 12.271*  --   --   FREET4  --  0.87  --   T3FREE  --   --  2.2   Anemia Panel: No results for input(s): VITAMINB12, FOLATE, FERRITIN, TIBC, IRON, RETICCTPCT in the last 72 hours. Sepsis Labs: Recent Labs  Lab 03/03/20 1032  LATICACIDVEN 1.3    Recent Results (from the past 240 hour(s))  Resp Panel by RT-PCR (Flu A&B, Covid) Nasopharyngeal Swab     Status: None   Collection Time: 03/03/20 12:37 PM   Specimen: Nasopharyngeal Swab; Nasopharyngeal(NP) swabs in  vial transport medium  Result Value Ref Range Status   SARS Coronavirus 2 by RT PCR NEGATIVE NEGATIVE Final    Comment: (NOTE) SARS-CoV-2 target nucleic acids are NOT DETECTED.  The SARS-CoV-2 RNA is generally detectable in upper respiratory specimens during the acute phase of infection. The lowest concentration of SARS-CoV-2 viral copies this assay can detect is 138 copies/mL. A negative result does not preclude SARS-Cov-2 infection and should not be used as the sole basis for treatment or other patient management decisions. A negative result may occur with  improper specimen collection/handling, submission of specimen other than  nasopharyngeal swab, presence of viral mutation(s) within the areas targeted by this assay, and inadequate number of viral copies(<138 copies/mL). A negative result must be combined with clinical observations, patient history, and epidemiological information. The expected result is Negative.  Fact Sheet for Patients:  BloggerCourse.com  Fact Sheet for Healthcare Providers:  SeriousBroker.it  This test is no t yet approved or cleared by the [place] FDA and  has been authorized for detection and/or diagnosis of SARS-CoV-2 by FDA under an Emergency Use Authorization (EUA). This EUA will remain  in effect (meaning this test can be used) for the duration of the COVID-19 declaration under Section 564(b)(1) of the Act, 21 U.S.C.section 360bbb-3(b)(1), unless the authorization is terminated  or revoked sooner.       Influenza A by PCR NEGATIVE NEGATIVE Final   Influenza B by PCR NEGATIVE NEGATIVE Final    Comment: (NOTE) The Xpert Xpress SARS-CoV-2/FLU/RSV plus assay is intended as an aid in the diagnosis of influenza from Nasopharyngeal swab specimens and should not be used as a sole basis for treatment. Nasal washings and aspirates are unacceptable for Xpert Xpress SARS-CoV-2/FLU/RSV testing.  Fact  Sheet for Patients: BloggerCourse.com  Fact Sheet for Healthcare Providers: SeriousBroker.it  This test is not yet approved or cleared by the [place] FDA and has been authorized for detection and/or diagnosis of SARS-CoV-2 by FDA under an Emergency Use Authorization (EUA). This EUA will remain in effect (meaning this test can be used) for the duration of the COVID-19 declaration under Section 564(b)(1) of the Act, 21 U.S.C. section 360bbb-3(b)(1), unless the authorization is terminated or revoked.  Performed at [facility], [address], [place], [place] [address]          Radiology Studies: CT Angio Chest PE W and/or Wo Contrast  Result Date: 03/03/2020 CLINICAL DATA:  Shortness of breath for 4 days. EXAM: CT ANGIOGRAPHY CHEST WITH CONTRAST TECHNIQUE: Multidetector CT imaging of the chest was performed using the standard protocol during bolus administration of intravenous contrast. Multiplanar CT image reconstructions and MIPs were obtained to evaluate the vascular anatomy. CONTRAST:  100 mL OMNIPAQUE IOHEXOL 350 MG/ML SOLN COMPARISON:  Single-view of the chest today. FINDINGS: Cardiovascular: Pulmonary emboli are seen in interlobar pulmonary artery branches bilaterally. There is no evidence of right heart strain. Mild cardiomegaly. No pericardial effusion. Mediastinum/Nodes: Small bilateral mediastinal and hilar lymph nodes are identified. No pathologic lymphadenopathy by CT size criteria. 0.9 cm low attenuating lesion in the right lobe of the thyroid is noted. Not clinically significant; no follow-up imaging recommended (ref: J Am Coll Radiol. 2015 Feb;12(2): 143-50). Lungs/Pleura: Small bilateral pleural effusions. Wedge-shaped opacities are seen in the right lower and right middle lobes worrisome for pulmonary infarcts. There is also some dependent atelectasis. Upper Abdomen: There is fatty infiltration of the liver.  Left renal cyst is partially imaged. Musculoskeletal: No acute or focal abnormality. Review of the MIP images confirms the above findings. IMPRESSION: The examination is positive for pulmonary emboli. There is no evidence of right heart strain but wedge-shaped opacities in the right middle and lower lobes are worrisome for pulmonary infarcts. Very small bilateral pleural effusions. Cardiomegaly. Fatty infiltration of the liver. Critical Value/emergent results were called by telephone at the time of interpretation on 03/03/2020 at 1:27 pm to provider [facility] , who verbally acknowledged these results. Electronically Signed   By: Drusilla Kanner M.D.   On: 03/03/2020 13:31   DG Chest Port 1 View  Result Date:  03/03/2020 CLINICAL DATA:  Shortness of breath. EXAM: PORTABLE CHEST 1 VIEW COMPARISON:  PA and lateral chest 11/13/2019. FINDINGS: Lungs clear. Heart size upper normal. No pneumothorax or pleural fluid. No acute or focal bony abnormality. IMPRESSION: No acute disease. Electronically Signed   By: Drusilla Kanner M.D.   On: 03/03/2020 11:03   VAS [place] LOWER EXTREMITY VENOUS (DVT)  Result Date: 03/04/2020  Lower Venous DVT Study Indications: Pulmonary embolism.  Risk Factors: Confirmed PE. Anticoagulation: Heparin. Limitations: Body habitus and poor ultrasound/tissue interface. Comparison Study: No prior studies. Performing Technologist: Chanda Busing RVT  Examination Guidelines: A complete evaluation includes B-mode imaging, spectral Doppler, color Doppler, and power Doppler as needed of all accessible portions of each vessel. Bilateral testing is considered an integral part of a complete examination. Limited examinations for reoccurring indications may be performed as noted. The reflux portion of the exam is performed with the patient in reverse Trendelenburg.  +---------+---------------+---------+-----------+----------+--------------+ RIGHT     CompressibilityPhasicitySpontaneityPropertiesThrombus Aging +---------+---------------+---------+-----------+----------+--------------+ CFV      Full           Yes      Yes                                 +---------+---------------+---------+-----------+----------+--------------+ SFJ      Full                                                        +---------+---------------+---------+-----------+----------+--------------+ FV Prox  Full                                                        +---------+---------------+---------+-----------+----------+--------------+ FV Mid   Full                                                        +---------+---------------+---------+-----------+----------+--------------+ FV DistalFull                                                        +---------+---------------+---------+-----------+----------+--------------+ PFV      Full                                                        +---------+---------------+---------+-----------+----------+--------------+ POP      Full           Yes      Yes                                 +---------+---------------+---------+-----------+----------+--------------+ PTV      Full                                                        +---------+---------------+---------+-----------+----------+--------------+  PERO     Full                                                        +---------+---------------+---------+-----------+----------+--------------+   +---------+---------------+---------+-----------+----------+--------------+ LEFT     CompressibilityPhasicitySpontaneityPropertiesThrombus Aging +---------+---------------+---------+-----------+----------+--------------+ CFV      Full           Yes      Yes                                 +---------+---------------+---------+-----------+----------+--------------+ SFJ      Full                                                         +---------+---------------+---------+-----------+----------+--------------+ FV Prox  Full                                                        +---------+---------------+---------+-----------+----------+--------------+ FV Mid   Full                                                        +---------+---------------+---------+-----------+----------+--------------+ FV DistalFull                                                        +---------+---------------+---------+-----------+----------+--------------+ PFV      Full                                                        +---------+---------------+---------+-----------+----------+--------------+ POP      Full           Yes      Yes                                 +---------+---------------+---------+-----------+----------+--------------+ PTV      Full                                                        +---------+---------------+---------+-----------+----------+--------------+ PERO     None  Acute          +---------+---------------+---------+-----------+----------+--------------+     Summary: RIGHT: - There is no evidence of deep vein thrombosis in the lower extremity.  - No cystic structure found in the popliteal fossa.  LEFT: - Findings consistent with acute deep vein thrombosis involving the left peroneal veins. - No cystic structure found in the popliteal fossa.  *See table(s) above for measurements and observations.    Preliminary    ECHOCARDIOGRAM LIMITED  Result Date: 03/04/2020    ECHOCARDIOGRAM LIMITED REPORT   Patient Name:   ONOFRIO TESSENDORF Date of Exam: 03/04/2020 Medical Rec #:  [id number]      Height:       70.0 in Accession #:    [id number]     Weight:       358.0 lb Date of Birth:  May 08, 1960       BSA:          2.673 m Patient Age:    59 years       BP:           127/95 mmHg Patient Gender: M              HR:           113 bpm. Exam  Location:  Inpatient Procedure: Limited Echo, Cardiac Doppler, Color Doppler and Intracardiac            Opacification Agent Indications:    I26.02 Pulmonary embolus  History:        Patient has prior history of Echocardiogram examinations, most                 recent 02/29/2020. CHF; Risk Factors:Hypertension, Diabetes,                 Dyslipidemia and Current Smoker. ETOH.  Sonographer:    Sheralyn Boatman RDCS Referring Phys: [id number] ANASTASSIA DOUTOVA  Sonographer Comments: Technically difficult study due to poor echo windows and patient is morbidly obese. Image acquisition challenging due to patient body habitus. IMPRESSIONS  1. Left ventricular ejection fraction, by estimation, is <20%. The left ventricle has severely decreased function. The left ventricle has no regional wall motion abnormalities. The left ventricular internal cavity size was moderately dilated. There is moderate left ventricular hypertrophy.  2. Right ventricular systolic function is moderately reduced. The right ventricular size is moderately enlarged. There is normal pulmonary artery systolic pressure.  3. Left atrial size was moderately dilated.  4. Right atrial size was moderately dilated.  5. The mitral valve is normal in structure. No evidence of mitral valve regurgitation. No evidence of mitral stenosis.  6. The aortic valve is normal in structure. Aortic valve regurgitation is not visualized. No aortic stenosis is present.  7. The inferior vena cava is dilated in size with <50% respiratory variability, suggesting right atrial pressure of 15 mmHg. Comparison(s): Prior images reviewed side by side. Changes from prior study are noted. FINDINGS  Left Ventricle: Left ventricular ejection fraction, by estimation, is <20%. The left ventricle has severely decreased function. The left ventricle has no regional wall motion abnormalities. Definity contrast agent was given IV to delineate the left ventricular endocardial borders. The left ventricular  internal cavity size was moderately dilated. There is moderate left ventricular hypertrophy. Right Ventricle: The right ventricular size is moderately enlarged. No increase in right ventricular wall thickness. Right ventricular systolic function is moderately reduced. There is normal pulmonary artery systolic pressure. The tricuspid regurgitant velocity is 1.93 m/s, and  with an assumed right atrial pressure of 3 mmHg, the estimated right ventricular systolic pressure is 17.9 mmHg. Left Atrium: Left atrial size was moderately dilated. Right Atrium: Right atrial size was moderately dilated. Pericardium: There is no evidence of pericardial effusion. Mitral Valve: The mitral valve is normal in structure. No evidence of mitral valve stenosis. Tricuspid Valve: The tricuspid valve is normal in structure. Tricuspid valve regurgitation is mild . No evidence of tricuspid stenosis. Aortic Valve: The aortic valve is normal in structure. Aortic valve regurgitation is not visualized. No aortic stenosis is present. Pulmonic Valve: The pulmonic valve was normal in structure. Pulmonic valve regurgitation is not visualized. No evidence of pulmonic stenosis. Aorta: The aortic root is normal in size and structure. Venous: The inferior vena cava is dilated in size with less than 50% respiratory variability, suggesting right atrial pressure of 15 mmHg. IAS/Shunts: No atrial level shunt detected by color flow Doppler. LEFT VENTRICLE PLAX 2D LVIDd:         6.60 cm LVIDs:         6.00 cm LV PW:         1.70 cm LV IVS:        1.50 cm LVOT diam:     2.30 cm LVOT Area:     4.15 cm  LV Volumes (MOD) LV vol d, MOD A4C: 136.0 ml LV vol s, MOD A4C: 107.0 ml LV SV MOD A4C:     136.0 ml RIGHT VENTRICLE         IVC TAPSE (M-mode): 1.9 cm  IVC diam: 2.60 cm LEFT ATRIUM         Index      RIGHT ATRIUM           Index LA diam:    4.30 cm 1.61 cm/m RA Area:     24.40 cm                                RA Volume:   82.40 ml  30.82 ml/m   AORTA Ao Root  diam: 3.80 cm TRICUSPID VALVE TR Peak grad:   14.9 mmHg TR Vmax:        193.00 cm/s  SHUNTS Systemic Diam: 2.30 cm Donato Schultz MD Electronically signed by Donato Schultz MD Signature Date/Time: 03/04/2020/2:14:10 PM    Final         Scheduled Meds: . allopurinol  100 mg Oral Daily  . carvedilol  6.25 mg Oral BID WC  . digoxin  0.125 mg Oral Daily  . sodium chloride flush  3 mL Intravenous Q12H  . spironolactone  12.5 mg Oral Daily   Continuous Infusions: . sodium chloride    . amiodarone 30 mg/hr (03/05/20 0714)  . heparin 2,600 Units/hr (03/05/20 0549)     LOS: 2 days    Time spent:35 minutes     Devlon Dosher A Editha Bridgeforth, MD [facility]   If 7PM-7AM, please contact night-coverage [email or web address]  03/05/2020, 10:09 AM

## 2020-03-05 NOTE — Progress Notes (Signed)
Patient ID: Drew [name], male   DOB: 11/19/1960, 60 y.o.   MRN: [id number]     Advanced Heart Failure Rounding Note  PCP-Cardiologist: Kardie Tobb, DO   Subjective:    No dyspnea at rest.  No further chest pain.  He remains in atrial flutter but rate down to 80s on amiodarone gtt.   He remains on heparin gtt for PE.    Objective:   Weight Range: (!) 162.4 kg Body mass index is 51.37 kg/m.   Vital Signs:   Temp:  [97.9 F (36.6 C)-99.2 F (37.3 C)] 97.9 F (36.6 C) (02/19 0809) Pulse Rate:  [64-110] 94 (02/19 0809) Resp:  [16-20] 20 (02/19 0809) BP: (97-126)/(66-103) 115/89 (02/19 0809) SpO2:  [93 %-95 %] 95 % (02/19 0809) Last BM Date: 03/02/20  Weight change: Filed Weights   03/03/20 1020  Weight: (!) 162.4 kg    Intake/Output:   Intake/Output Summary (Last 24 hours) at 03/05/2020 1044 Last data filed at 03/05/2020 0800 Gross per 24 hour  Intake 1431.59 ml  Output 750 ml  Net 681.59 ml      Physical Exam    General:  Well appearing. No resp difficulty HEENT: Normal Neck: Supple. JVP 12 cm. Carotids 2+ bilat; no bruits. No lymphadenopathy or thyromegaly appreciated. Cor: PMI nondisplaced. Irregular rate & rhythm. No rubs, gallops or murmurs. Lungs: Clear Abdomen: Soft, nontender, nondistended. No hepatosplenomegaly. No bruits or masses. Good bowel sounds. Extremities: No cyanosis, clubbing, rash. Trace ankle edema.  Neuro: Alert & orientedx3, cranial nerves grossly intact. moves all 4 extremities w/o difficulty. Affect pleasant   Telemetry   Typical atrial flutter rate 80s (personally reviewed)  Labs    CBC Recent Labs    03/04/20 0000 03/05/20 0333  WBC 7.8 6.5  NEUTROABS 4.7  --   HGB 12.5* 11.1*  HCT 38.6* 33.0*  MCV 91.7 91.9  PLT 328 319   Basic Metabolic Panel Recent Labs    19/50/93 0000 03/05/20 0333  NA 136 138  K 3.1* 3.7  CL 99 103  CO2 26 22  GLUCOSE 108* 109*  BUN 19 14  CREATININE 1.24 1.21  CALCIUM 8.9 8.6*  MG  2.5*  --   PHOS 3.9  --    Liver Function Tests Recent Labs    03/04/20 0000  AST 32  ALT 28  ALKPHOS 61  BILITOT 1.2  PROT 7.5  ALBUMIN 3.5   No results for input(s): LIPASE, AMYLASE in the last 72 hours. Cardiac Enzymes No results for input(s): CKTOTAL, CKMB, CKMBINDEX, TROPONINI in the last 72 hours.  BNP: BNP (last 3 results) Recent Labs    11/13/19 1456 03/04/20 0000  BNP 8 27.0    ProBNP (last 3 results) Recent Labs    03/16/19 1026 06/30/19 1039  PROBNP 39.0 45.0     D-Dimer No results for input(s): DDIMER in the last 72 hours. Hemoglobin A1C No results for input(s): HGBA1C in the last 72 hours. Fasting Lipid Panel No results for input(s): CHOL, HDL, LDLCALC, TRIG, CHOLHDL, LDLDIRECT in the last 72 hours. Thyroid Function Tests Recent Labs    03/04/20 0000 03/04/20 0831  TSH 12.271*  --   T3FREE  --  2.2    Other results:   Imaging    VAS [place] LOWER EXTREMITY VENOUS (DVT)  Result Date: 03/04/2020  Lower Venous DVT Study Indications: Pulmonary embolism.  Risk Factors: Confirmed PE. Anticoagulation: Heparin. Limitations: Body habitus and poor ultrasound/tissue interface. Comparison Study: No prior studies. Performing Technologist:  Chanda Busing RVT  Examination Guidelines: A complete evaluation includes B-mode imaging, spectral Doppler, color Doppler, and power Doppler as needed of all accessible portions of each vessel. Bilateral testing is considered an integral part of a complete examination. Limited examinations for reoccurring indications may be performed as noted. The reflux portion of the exam is performed with the patient in reverse Trendelenburg.  +---------+---------------+---------+-----------+----------+--------------+ RIGHT    CompressibilityPhasicitySpontaneityPropertiesThrombus Aging +---------+---------------+---------+-----------+----------+--------------+ CFV      Full           Yes      Yes                                  +---------+---------------+---------+-----------+----------+--------------+ SFJ      Full                                                        +---------+---------------+---------+-----------+----------+--------------+ FV Prox  Full                                                        +---------+---------------+---------+-----------+----------+--------------+ FV Mid   Full                                                        +---------+---------------+---------+-----------+----------+--------------+ FV DistalFull                                                        +---------+---------------+---------+-----------+----------+--------------+ PFV      Full                                                        +---------+---------------+---------+-----------+----------+--------------+ POP      Full           Yes      Yes                                 +---------+---------------+---------+-----------+----------+--------------+ PTV      Full                                                        +---------+---------------+---------+-----------+----------+--------------+ PERO     Full                                                        +---------+---------------+---------+-----------+----------+--------------+   +---------+---------------+---------+-----------+----------+--------------+  LEFT     CompressibilityPhasicitySpontaneityPropertiesThrombus Aging +---------+---------------+---------+-----------+----------+--------------+ CFV      Full           Yes      Yes                                 +---------+---------------+---------+-----------+----------+--------------+ SFJ      Full                                                        +---------+---------------+---------+-----------+----------+--------------+ FV Prox  Full                                                         +---------+---------------+---------+-----------+----------+--------------+ FV Mid   Full                                                        +---------+---------------+---------+-----------+----------+--------------+ FV DistalFull                                                        +---------+---------------+---------+-----------+----------+--------------+ PFV      Full                                                        +---------+---------------+---------+-----------+----------+--------------+ POP      Full           Yes      Yes                                 +---------+---------------+---------+-----------+----------+--------------+ PTV      Full                                                        +---------+---------------+---------+-----------+----------+--------------+ PERO     None                                         Acute          +---------+---------------+---------+-----------+----------+--------------+     Summary: RIGHT: - There is no evidence of deep vein thrombosis in the lower extremity.  - No cystic structure found in the popliteal fossa.  LEFT: - Findings consistent with acute deep vein thrombosis involving the left peroneal veins. - No cystic structure found in the popliteal  fossa.  *See table(s) above for measurements and observations.    Preliminary    ECHOCARDIOGRAM LIMITED  Result Date: 03/04/2020    ECHOCARDIOGRAM LIMITED REPORT   Patient Name:   Drew [name] Date of Exam: 03/04/2020 Medical Rec #:  [id number]      Height:       70.0 in Accession #:    [id number]     Weight:       358.0 lb Date of Birth:  1960-02-20       BSA:          2.673 m Patient Age:    59 years       BP:           127/95 mmHg Patient Gender: M              HR:           113 bpm. Exam Location:  Inpatient Procedure: Limited Echo, Cardiac Doppler, Color Doppler and Intracardiac            Opacification Agent Indications:    I26.02 Pulmonary embolus  History:         Patient has prior history of Echocardiogram examinations, most                 recent 02/29/2020. CHF; Risk Factors:Hypertension, Diabetes,                 Dyslipidemia and Current Smoker. ETOH.  Sonographer:    Sheralyn Boatman RDCS Referring Phys: [id number] ANASTASSIA DOUTOVA  Sonographer Comments: Technically difficult study due to poor echo windows and patient is morbidly obese. Image acquisition challenging due to patient body habitus. IMPRESSIONS  1. Left ventricular ejection fraction, by estimation, is <20%. The left ventricle has severely decreased function. The left ventricle has no regional wall motion abnormalities. The left ventricular internal cavity size was moderately dilated. There is moderate left ventricular hypertrophy.  2. Right ventricular systolic function is moderately reduced. The right ventricular size is moderately enlarged. There is normal pulmonary artery systolic pressure.  3. Left atrial size was moderately dilated.  4. Right atrial size was moderately dilated.  5. The mitral valve is normal in structure. No evidence of mitral valve regurgitation. No evidence of mitral stenosis.  6. The aortic valve is normal in structure. Aortic valve regurgitation is not visualized. No aortic stenosis is present.  7. The inferior vena cava is dilated in size with <50% respiratory variability, suggesting right atrial pressure of 15 mmHg. Comparison(s): Prior images reviewed side by side. Changes from prior study are noted. FINDINGS  Left Ventricle: Left ventricular ejection fraction, by estimation, is <20%. The left ventricle has severely decreased function. The left ventricle has no regional wall motion abnormalities. Definity contrast agent was given IV to delineate the left ventricular endocardial borders. The left ventricular internal cavity size was moderately dilated. There is moderate left ventricular hypertrophy. Right Ventricle: The right ventricular size is moderately enlarged. No increase in right  ventricular wall thickness. Right ventricular systolic function is moderately reduced. There is normal pulmonary artery systolic pressure. The tricuspid regurgitant velocity is 1.93 m/s, and with an assumed right atrial pressure of 3 mmHg, the estimated right ventricular systolic pressure is 17.9 mmHg. Left Atrium: Left atrial size was moderately dilated. Right Atrium: Right atrial size was moderately dilated. Pericardium: There is no evidence of pericardial effusion. Mitral Valve: The mitral valve is normal in structure. No evidence of mitral valve stenosis. Tricuspid Valve: The tricuspid valve is  normal in structure. Tricuspid valve regurgitation is mild . No evidence of tricuspid stenosis. Aortic Valve: The aortic valve is normal in structure. Aortic valve regurgitation is not visualized. No aortic stenosis is present. Pulmonic Valve: The pulmonic valve was normal in structure. Pulmonic valve regurgitation is not visualized. No evidence of pulmonic stenosis. Aorta: The aortic root is normal in size and structure. Venous: The inferior vena cava is dilated in size with less than 50% respiratory variability, suggesting right atrial pressure of 15 mmHg. IAS/Shunts: No atrial level shunt detected by color flow Doppler. LEFT VENTRICLE PLAX 2D LVIDd:         6.60 cm LVIDs:         6.00 cm LV PW:         1.70 cm LV IVS:        1.50 cm LVOT diam:     2.30 cm LVOT Area:     4.15 cm  LV Volumes (MOD) LV vol d, MOD A4C: 136.0 ml LV vol s, MOD A4C: 107.0 ml LV SV MOD A4C:     136.0 ml RIGHT VENTRICLE         IVC TAPSE (M-mode): 1.9 cm  IVC diam: 2.60 cm LEFT ATRIUM         Index      RIGHT ATRIUM           Index LA diam:    4.30 cm 1.61 cm/m RA Area:     24.40 cm                                RA Volume:   82.40 ml  30.82 ml/m   AORTA Ao Root diam: 3.80 cm TRICUSPID VALVE TR Peak grad:   14.9 mmHg TR Vmax:        193.00 cm/s  SHUNTS Systemic Diam: 2.30 cm Donato Schultz MD Electronically signed by Donato Schultz MD Signature  Date/Time: 03/04/2020/2:14:10 PM    Final       Medications:     Scheduled Medications: . allopurinol  100 mg Oral Daily  . carvedilol  6.25 mg Oral BID WC  . colchicine  0.6 mg Oral Daily  . digoxin  0.125 mg Oral Daily  . furosemide  40 mg Intravenous BID  . potassium chloride  40 mEq Oral Once  . predniSONE  40 mg Oral Q breakfast  . sacubitril-valsartan  1 tablet Oral BID  . sodium chloride flush  3 mL Intravenous Q12H  . spironolactone  12.5 mg Oral Daily     Infusions: . sodium chloride    . amiodarone 30 mg/hr (03/05/20 0714)  . heparin 2,600 Units/hr (03/05/20 0549)     PRN Medications:  sodium chloride, acetaminophen **OR** acetaminophen, albuterol, HYDROcodone-acetaminophen, sodium chloride flush    Assessment/Plan   1. Right-sided PE possibly with infarction and LLE DVT: Unprovoked.  Patient has no FH of VTE.  He has not been sedentary, has been working full time at the [employer].  Echo in 2/14 with moderate RV dilation/moderate dysfunction on my read, echo today (2/18) with similar appearance of the RV.  The RV looks worse by echo now than it did on previous echo in 3/21.  I think based on symptoms that he had the PE on 2/13.  Symptoms are minimal at rest currently.  HS-TnI and BNP surprisingly normal at admission.  - Continue heparin gtt, eventual transition to Eliquis.  - He  does have RV strain first noted on 2/14 echo, but I think that the PE occurred a number of days prior to admission, and suspect that thrombolysis is not warranted at this point.  2. Atrial flutter: Typical flutter.  This was noted first at time of echo on 2/14.  Suspect this was triggered by PE.  He is now rate-controlled on amiodarone gtt. - Continue amiodarone gtt for rate control.  - Continue anticoagulation.   - If he remains in flutter, eventual TEE-guided DCCV will be needed.  3. Acute on chronic systolic CHF: Echo from 2/18 was reviewed, shows EF around 20% with moderate RV  dysfunction and dilated RV.  Cath from 5/20 with no significant CAD.  Cause of CMP is uncertain, EF has been low since 2020.  NICM.  ?Viral myocarditis.  On exam, he is volume overloaded likely due to a degree of RV failure in setting of PE.   - Can continue Coreg at lower dose 6.25 mg bid.  - Continue spironolactone 12.5 mg daily.  - Continue digoxin 0.125 to help with rate control and RV function.  - Lasix 40 mg IV bid today, follow I/Os and weights.   - Restart Entresto 24/26 bid today. - Should have cardiac MRI eventually.  4. Gout: Flare in left foot, does not think he can bear weight.  - He is on allopurinol chronically.  - Add colchicine and and a burst of prednisone.   Length of Stay: 2  [name], MD  03/05/2020, 10:44 AM  Advanced Heart Failure Team Pager [phone] (M-F; 7a - 4p)  Please contact [facility] for night-coverage after hours (4p -7a ) and weekends on [email or web address]

## 2020-03-05 NOTE — Progress Notes (Signed)
ANTICOAGULATION CONSULT NOTE  Pharmacy Consult for heparin Indication: pulmonary embolus and left DVT  Allergies  Allergen Reactions  . Tetracyclines & Related Hives and Itching    Patient Measurements: Height: 5\' 10"  (177.8 cm) Weight: (!) 162.4 kg (358 lb) IBW/kg (Calculated) : 73 Heparin Dosing Weight: 112 kg  Vital Signs: Temp: 98 F (36.7 C) (02/19 1045) Temp Source: Oral (02/19 1045) BP: 110/80 (02/19 1045) Pulse Rate: 80 (02/19 1045)  Labs: Recent Labs    03/03/20 1032 03/03/20 1229 03/03/20 2032 03/04/20 0000 03/04/20 0554 03/04/20 1325 03/04/20 2105 03/05/20 0333  HGB 12.9*  --   --  12.5*  --   --   --  11.1*  HCT 39.1  --   --  38.6*  --   --   --  33.0*  PLT 352  --   --  328  --   --   --  319  LABPROT 13.8  --   --   --   --   --   --   --   INR 1.1  --   --   --   --   --   --   --   HEPARINUNFRC  --   --    < >  --    < > 0.30 0.33 0.39  CREATININE 1.44*  --   --  1.24  --   --   --  1.21  TROPONINIHS 11 11  --   --   --   --   --   --    < > = values in this interval not displayed.    Estimated Creatinine Clearance: 103.7 mL/min (by C-G formula based on SCr of 1.21 mg/dL).  Assessment: 51 yom presenting with acute PE with no RHS, continuing on heparin drip. Patient is not on anticoagulation PTA.  Heparin level is therapeutic at 0.39 on 2600 units/hr. CBC stable, no bleeding noted.  Goal of Therapy:  Heparin level 0.3-0.7 units/ml Monitor platelets by anticoagulation protocol: Yes   Plan:  Continue heparin gtt at 2600 units/hr  Monitor daily heparin level, CBC, s/sx bleeding F/u ability to transition to DOAC   Thank you for involving pharmacy in this patient's care.  60, PharmD, BCPS Clinical Pharmacist Clinical phone for 03/05/2020 until 3p is 03/07/2020 03/05/2020 12:18 PM  **Pharmacist phone directory can be found on [email or web address] listed under [facility]**

## 2020-03-06 DIAGNOSIS — I5043 Acute on chronic combined systolic (congestive) and diastolic (congestive) heart failure: Secondary | ICD-10-CM | POA: Diagnosis not present

## 2020-03-06 LAB — CBC
HCT: 34.6 % — ABNORMAL LOW (ref 39.0–52.0)
Hemoglobin: 11.3 g/dL — ABNORMAL LOW (ref 13.0–17.0)
MCH: 30.1 pg (ref 26.0–34.0)
MCHC: 32.7 g/dL (ref 30.0–36.0)
MCV: 92 fL (ref 80.0–100.0)
Platelets: 383 10*3/uL (ref 150–400)
RBC: 3.76 MIL/uL — ABNORMAL LOW (ref 4.22–5.81)
RDW: 13.4 % (ref 11.5–15.5)
WBC: 8.1 10*3/uL (ref 4.0–10.5)
nRBC: 0 % (ref 0.0–0.2)

## 2020-03-06 LAB — PROTIME-INR
INR: 1.2 (ref 0.8–1.2)
Prothrombin Time: 14.7 seconds (ref 11.4–15.2)

## 2020-03-06 LAB — TSH: TSH: 4.875 u[IU]/mL — ABNORMAL HIGH (ref 0.350–4.500)

## 2020-03-06 LAB — HEPARIN LEVEL (UNFRACTIONATED): Heparin Unfractionated: 0.48 IU/mL (ref 0.30–0.70)

## 2020-03-06 LAB — ANTITHROMBIN III: AntiThromb III Func: 61 % — ABNORMAL LOW (ref 75–120)

## 2020-03-06 MED ORDER — APIXABAN 5 MG PO TABS
10.0000 mg | ORAL_TABLET | Freq: Two times a day (BID) | ORAL | Status: DC
  Administered 2020-03-06 – 2020-03-09 (×7): 10 mg via ORAL
  Filled 2020-03-06 (×7): qty 2

## 2020-03-06 MED ORDER — DAPAGLIFLOZIN PROPANEDIOL 10 MG PO TABS
10.0000 mg | ORAL_TABLET | Freq: Every day | ORAL | Status: DC
  Administered 2020-03-06 – 2020-03-09 (×4): 10 mg via ORAL
  Filled 2020-03-06 (×4): qty 1

## 2020-03-06 MED ORDER — APIXABAN 5 MG PO TABS: 5.0000 mg | ORAL_TABLET | Freq: Two times a day (BID) | ORAL | Status: DC

## 2020-03-06 MED ORDER — SPIRONOLACTONE 25 MG PO TABS
25.0000 mg | ORAL_TABLET | Freq: Every day | ORAL | Status: DC
  Administered 2020-03-07 – 2020-03-09 (×3): 25 mg via ORAL
  Filled 2020-03-06 (×3): qty 1

## 2020-03-06 MED ORDER — SODIUM CHLORIDE 0.9 % IV SOLN: INTRAVENOUS | Status: DC

## 2020-03-06 NOTE — Progress Notes (Signed)
ANTICOAGULATION CONSULT NOTE  Pharmacy Consult for apixaban Indication: pulmonary embolus and left DVT  Allergies  Allergen Reactions  . Tetracyclines & Related Hives and Itching    Patient Measurements: Height: 5\' 10"  (177.8 cm) Weight: (!) 165 kg (363 lb 12.1 oz) IBW/kg (Calculated) : 73 Heparin Dosing Weight: 112 kg  Vital Signs: Temp: 98.1 F (36.7 C) (02/20 1050) Temp Source: Oral (02/20 1050) BP: 107/74 (02/20 1050) Pulse Rate: 71 (02/20 1050)  Labs: Recent Labs    03/03/20 1229 03/03/20 2032 03/04/20 0000 03/04/20 0554 03/04/20 2105 03/05/20 0333 03/06/20 0353  HGB  --    < > 12.5*  --   --  11.1* 11.3*  HCT  --   --  38.6*  --   --  33.0* 34.6*  PLT  --   --  328  --   --  319 383  HEPARINUNFRC  --    < >  --    < > 0.33 0.39 0.48  CREATININE  --   --  1.24  --   --  1.21  --   TROPONINIHS 11  --   --   --   --   --   --    < > = values in this interval not displayed.    Estimated Creatinine Clearance: 104.6 mL/min (by C-G formula based on SCr of 1.21 mg/dL).  Assessment: 56 yom presenting with acute PE with no RHS, who was started on heparin drip. Patient was not on anticoagulation PTA. Pharmacy consulted to transition to apixaban today. No bleeding noted, CBC is stable. Plan is for TEE-guided DCCV tomorrow.  Goal of Therapy:  Full dose anticoagulation    Plan:  Disontinue heparin drip Apixaban 10 mg PO bid for 7 days then 5 mg PO bid Monitor for s/sx of bleeding Education prior to discharge  Thank you for involving pharmacy in this patient's care.  58, PharmD, BCPS Clinical Pharmacist Clinical phone for 03/06/2020 until 3p is 03/08/2020 03/06/2020 11:01 AM  **Pharmacist phone directory can be found on [email or web address] listed under [facility]**

## 2020-03-06 NOTE — H&P (View-Only) (Signed)
Patient ID: Drew [name], male   DOB: [birth year], 60 y.o.   MRN: [id number]     Advanced Heart Failure Rounding Note  PCP-Cardiologist: Kardie Tobb, DO   Subjective:    No dyspnea at rest.  No further chest pain.  He remains in atrial flutter but rate down to 80s on amiodarone gtt. He has not been out of bed.  Ankle pain is improved.   He remains on heparin gtt for PE.    Objective:   Weight Range: (!) 165 kg Body mass index is 52.19 kg/m.   Vital Signs:   Temp:  [98 F (36.7 C)-98.6 F (37 C)] 98.1 F (36.7 C) (02/20 1050) Pulse Rate:  [61-90] 71 (02/20 1050) Resp:  [14-22] 20 (02/20 1050) BP: (107-134)/(67-94) 107/74 (02/20 1050) SpO2:  [94 %-96 %] 95 % (02/20 1050) Weight:  [165 kg] 165 kg (02/20 0500) Last BM Date: 03/05/20  Weight change: Filed Weights   03/03/20 1020 03/06/20 0500  Weight: (!) 162.4 kg (!) 165 kg    Intake/Output:   Intake/Output Summary (Last 24 hours) at 03/06/2020 1108 Last data filed at 03/06/2020 0800 Gross per 24 hour  Intake 816.92 ml  Output 1100 ml  Net -283.08 ml      Physical Exam    General: NAD Neck: No JVD, no thyromegaly or thyroid nodule.  Lungs: Clear to auscultation bilaterally with normal respiratory effort. CV: Nondisplaced PMI.  Heart regular S1/S2, no S3/S4, no murmur.  No peripheral edema.  \ Abdomen: Soft, nontender, no hepatosplenomegaly, no distention.  Skin: Intact without lesions or rashes.  Neurologic: Alert and oriented x 3.  Psych: Normal affect. Extremities: No clubbing or cyanosis.  HEENT: Normal.    Telemetry   Typical atrial flutter rate 80s (personally reviewed)  Labs    CBC Recent Labs    03/04/20 0000 03/05/20 0333 03/06/20 0353  WBC 7.8 6.5 8.1  NEUTROABS 4.7  --   --   HGB 12.5* 11.1* 11.3*  HCT 38.6* 33.0* 34.6*  MCV 91.7 91.9 92.0  PLT 328 319 383   Basic Metabolic Panel Recent Labs    93/71/69 0000 03/05/20 0333  NA 136 138  K 3.1* 3.7  CL 99 103  CO2 26 22   GLUCOSE 108* 109*  BUN 19 14  CREATININE 1.24 1.21  CALCIUM 8.9 8.6*  MG 2.5*  --   PHOS 3.9  --    Liver Function Tests Recent Labs    03/04/20 0000  AST 32  ALT 28  ALKPHOS 61  BILITOT 1.2  PROT 7.5  ALBUMIN 3.5   No results for input(s): LIPASE, AMYLASE in the last 72 hours. Cardiac Enzymes No results for input(s): CKTOTAL, CKMB, CKMBINDEX, TROPONINI in the last 72 hours.  BNP: BNP (last 3 results) Recent Labs    11/13/19 1456 03/04/20 0000  BNP 8 27.0    ProBNP (last 3 results) Recent Labs    03/16/19 1026 06/30/19 1039  PROBNP 39.0 45.0     D-Dimer No results for input(s): DDIMER in the last 72 hours. Hemoglobin A1C No results for input(s): HGBA1C in the last 72 hours. Fasting Lipid Panel No results for input(s): CHOL, HDL, LDLCALC, TRIG, CHOLHDL, LDLDIRECT in the last 72 hours. Thyroid Function Tests Recent Labs    03/04/20 0831 03/06/20 0353  TSH  --  4.875*  T3FREE 2.2  --     Other results:   Imaging    No results found.   Medications:  Scheduled Medications: . allopurinol  100 mg Oral Daily  . apixaban  10 mg Oral BID   Followed by  . [START ON 03/13/2020] apixaban  5 mg Oral BID  . carvedilol  6.25 mg Oral BID WC  . colchicine  0.6 mg Oral Daily  . dapagliflozin propanediol  10 mg Oral Daily  . digoxin  0.125 mg Oral Daily  . predniSONE  40 mg Oral Q breakfast  . sacubitril-valsartan  1 tablet Oral BID  . sodium chloride flush  3 mL Intravenous Q12H  . [START ON 03/07/2020] spironolactone  25 mg Oral Daily    Infusions: . sodium chloride    . amiodarone 30 mg/hr (03/06/20 0552)    PRN Medications: sodium chloride, acetaminophen **OR** acetaminophen, albuterol, HYDROcodone-acetaminophen, sodium chloride flush    Assessment/Plan   1. Right-sided PE possibly with infarction and LLE DVT: Unprovoked.  Patient has no FH of VTE.  He has not been sedentary, has been working full time at the [employer].  Echo in 2/14  with moderate RV dilation/moderate dysfunction on my read, echo today (2/18) with similar appearance of the RV.  The RV looks worse by echo now than it did on previous echo in 3/21.  I think based on symptoms that he had the PE on 2/13.  Symptoms are minimal at rest currently.  HS-TnI and BNP surprisingly normal at admission.  - Transition from heparin gtt to Eliquis today, asked pharmacy to make transition.  - He does have RV strain first noted on 2/14 echo, but I think that the PE occurred a number of days prior to admission, and suspect that thrombolysis is not warranted at this point.  2. Atrial flutter: Typical flutter.  This was noted first at time of echo on 2/14.  Suspect this was triggered by PE.  He is now rate-controlled on amiodarone gtt. - Continue amiodarone gtt for rate control.  - Continue anticoagulation.   - TEE-guided DCCV tomorrow.  Discussed risks/benefits with patient and he agrees to procedure.   3. Acute on chronic systolic CHF: Echo from 2/18 was reviewed, shows EF around 20% with moderate RV dysfunction and dilated RV.  Cath from 5/20 with no significant CAD.  Cause of CMP is uncertain, EF has been low since 2020.  NICM.  ?Viral myocarditis.  Volume status looks better today with diuresis yesterday.   - Can continue Coreg at lower dose 6.25 mg bid.  - Increase spironolactone to 25 mg daily.  - Continue digoxin 0.125 to help with rate control and RV function.  - Got IV Lasix today, volume looks better.  Can stop.    - Continue Entresto 24/26 bid. - Should have cardiac MRI eventually.  - Add dapagliflozin 10 mg daily.  4. Gout: Flare in left foot, now improved.  - He is on allopurinol chronically.  - Added colchicine and and a burst of prednisone.   Out of bed, ambulate.   Length of Stay: 3  [name], MD  03/06/2020, 11:08 AM  Advanced Heart Failure Team Pager [phone] (M-F; 7a - 4p)  Please contact [facility] for night-coverage after hours (4p -7a ) and  weekends on [email or web address]

## 2020-03-06 NOTE — Progress Notes (Signed)
PROGRESS NOTE    Drew DurhamWoodrow [name]  ZOX:[id number] DOB: 07/13/1960 DOA: 03/03/2020 PCP: Esperanza RichtersSaguier, Edward, PA-C   Brief Narrative: 60 year old with past medical history significant for nonischemic cardiomyopathy ejection fraction 20 to 25% March 2021, heart failure with reduced ejection fraction, tobacco use, hyperlipidemia, hypertension, pulmonary hypertension, morbid obesity presented complaining of worsening shortness of breath with minimal exertion noted to be hypoxic at his cardiologist's office.  He report pleuritic chest pain and shortness of breath for the last 3 days prior to admission.  CT angio: The examination is positive for pulmonary emboli. There is no evidence of right heart strain but wedge-shaped opacities in the right middle and lower lobes are worrisome for pulmonary infarcts. Very small bilateral pleural effusions.   Assessment & Plan:   Active Problems:   Essential hypertension   Hyperlipidemia   Tobacco abuse   Obesity, Class III, BMI 40-49.9 (morbid obesity) (HCC)   Nonischemic cardiomyopathy (HCC)   Dilated cardiomyopathy (HCC)   HTN (hypertension)   Pulmonary embolism (HCC)   Chronic systolic CHF (congestive heart failure) (HCC)   Hypokalemia   AKI (acute kidney injury) (HCC)   1-Bilateral PE, Pulmonary infract.  Currently on 3 L oxygen. Tachycardia improved on amiodarone.  Continue with heparin. Started on Heparin 2/17. Plan to transition to eliquis.  ECHO, EF 20 %, Moderate reduce RV function.  Pulmonary consulted. Recommended indefinitely anticoagulation.  Doppler LE positive for left peroneal DVT.  Hypercoagulable panel pending.  He will need appropriate screening for malignancy for age.  Will ask CM to make sure patient insurance will cover Eliquis.  Cardiology planning transition from heparin to eliquis today.   2-Acute hypoxic Respiratory failure; secondary to PE.  continue with oxygen supplementation.  Treated with heparin.  Transition to eliquis.    3-Hypokalemia; replaced.   4-Elevated TSH;   Free T 3 and T 4. Normal.  He will need TSH in 4 weeks.  Repeated TSH: 12----4.8 improved/   5-Class III obesity; needs weight loss.  Acute on Chronic systolic Heart failure exacerbation:  Appreciate HF team. Patient was started on  IV lasix. Started on spironolactone. Entresto resume 2/19.  He will need cardiac MRI> at some point.  Continue coreg, started digoxin.  Started on dapagliflozin.   AKI;  Improved cr down to 1.2.  Gout; started on colchicine and prednisone.  A flutter; started on amiodarone Gtt. Plan for cardioversion tomorrow.   Estimated body mass index is 52.19 kg/m as calculated from the following:   Height as of this encounter: 5\' 10"  (1.778 m).   Weight as of this encounter: 165 kg.   DVT prophylaxis: Heparin gtt Code Status: Full code Family Communication:  Disposition Plan:  Status is: Inpatient  Remains inpatient appropriate because:IV treatments appropriate due to intensity of illness or inability to take PO   Dispo: The patient is from: Home              Anticipated d/c is to: Home              Anticipated d/c date is: 3 days              Patient currently is not medically stable to d/c.   Difficult to place patient No        Consultants:   Pulmonary   Procedures:   ECHO  Doppler  Antimicrobials:    Subjective: He is breathing better. Right foot pain resolved.   Objective: Vitals:   03/06/20 0000 03/06/20 0337 03/06/20 0500 03/06/20  0700  BP: 112/75 121/75  117/77  Pulse: 70   67  Resp: 20 15  14   Temp: 98.3 F (36.8 C) 98 F (36.7 C)  98.3 F (36.8 C)  TempSrc: Oral Oral  Oral  SpO2: 96% 95%  94%  Weight:   (!) 165 kg   Height:        Intake/Output Summary (Last 24 hours) at 03/06/2020 1009 Last data filed at 03/06/2020 0800 Gross per 24 hour  Intake 936.92 ml  Output 1100 ml  Net -163.08 ml   Filed Weights   03/03/20 1020 03/06/20 0500  Weight: (!) 162.4 kg  (!) 165 kg    Examination:  General exam: NAD Respiratory system: Normal respiratory effort, no wheezing.  Cardiovascular system:  S 1, S 2 IRR Gastrointestinal system: BS present, soft nt Central nervous system: Alert Extremities: symmetric power  Data Reviewed: I have personally reviewed following labs and imaging studies  CBC: Recent Labs  Lab 03/03/20 1032 03/04/20 0000 03/05/20 0333 03/06/20 0353  WBC 9.1 7.8 6.5 8.1  NEUTROABS  --  4.7  --   --   HGB 12.9* 12.5* 11.1* 11.3*  HCT 39.1 38.6* 33.0* 34.6*  MCV 91.6 91.7 91.9 92.0  PLT 352 328 319 383   Basic Metabolic Panel: Recent Labs  Lab 03/03/20 1032 03/04/20 0000 03/05/20 0333  NA 137 136 138  K 3.1* 3.1* 3.7  CL 97* 99 103  CO2 28 26 22   GLUCOSE 122* 108* 109*  BUN 28* 19 14  CREATININE 1.44* 1.24 1.21  CALCIUM 9.3 8.9 8.6*  MG  --  2.5*  --   PHOS  --  3.9  --    GFR: Estimated Creatinine Clearance: 104.6 mL/min (by C-G formula based on SCr of 1.21 mg/dL). Liver Function Tests: Recent Labs  Lab 03/04/20 0000  AST 32  ALT 28  ALKPHOS 61  BILITOT 1.2  PROT 7.5  ALBUMIN 3.5   No results for input(s): LIPASE, AMYLASE in the last 168 hours. No results for input(s): AMMONIA in the last 168 hours. Coagulation Profile: Recent Labs  Lab 03/03/20 1032  INR 1.1   Cardiac Enzymes: No results for input(s): CKTOTAL, CKMB, CKMBINDEX, TROPONINI in the last 168 hours. BNP (last 3 results) Recent Labs    03/16/19 1026 06/30/19 1039  PROBNP 39.0 45.0   HbA1C: No results for input(s): HGBA1C in the last 72 hours. CBG: No results for input(s): GLUCAP in the last 168 hours. Lipid Profile: No results for input(s): CHOL, HDL, LDLCALC, TRIG, CHOLHDL, LDLDIRECT in the last 72 hours. Thyroid Function Tests: Recent Labs    03/04/20 0717 03/04/20 0831 03/06/20 0353  TSH  --   --  4.875*  FREET4 0.87  --   --   T3FREE  --  2.2  --    Anemia Panel: No results for input(s): VITAMINB12, FOLATE,  FERRITIN, TIBC, IRON, RETICCTPCT in the last 72 hours. Sepsis Labs: Recent Labs  Lab 03/03/20 1032  LATICACIDVEN 1.3    Recent Results (from the past 240 hour(s))  Resp Panel by RT-PCR (Flu A&B, Covid) Nasopharyngeal Swab     Status: None   Collection Time: 03/03/20 12:37 PM   Specimen: Nasopharyngeal Swab; Nasopharyngeal(NP) swabs in vial transport medium  Result Value Ref Range Status   SARS Coronavirus 2 by RT PCR NEGATIVE NEGATIVE Final    Comment: (NOTE) SARS-CoV-2 target nucleic acids are NOT DETECTED.  The SARS-CoV-2 RNA is generally detectable in upper respiratory specimens during  the acute phase of infection. The lowest concentration of SARS-CoV-2 viral copies this assay can detect is 138 copies/mL. A negative result does not preclude SARS-Cov-2 infection and should not be used as the sole basis for treatment or other patient management decisions. A negative result may occur with  improper specimen collection/handling, submission of specimen other than nasopharyngeal swab, presence of viral mutation(s) within the areas targeted by this assay, and inadequate number of viral copies(<138 copies/mL). A negative result must be combined with clinical observations, patient history, and epidemiological information. The expected result is Negative.  Fact Sheet for Patients:  BloggerCourse.com  Fact Sheet for Healthcare Providers:  SeriousBroker.it  This test is no t yet approved or cleared by the [place] FDA and  has been authorized for detection and/or diagnosis of SARS-CoV-2 by FDA under an Emergency Use Authorization (EUA). This EUA will remain  in effect (meaning this test can be used) for the duration of the COVID-19 declaration under Section 564(b)(1) of the Act, 21 U.S.C.section 360bbb-3(b)(1), unless the authorization is terminated  or revoked sooner.       Influenza A by PCR NEGATIVE NEGATIVE Final    Influenza B by PCR NEGATIVE NEGATIVE Final    Comment: (NOTE) The Xpert Xpress SARS-CoV-2/FLU/RSV plus assay is intended as an aid in the diagnosis of influenza from Nasopharyngeal swab specimens and should not be used as a sole basis for treatment. Nasal washings and aspirates are unacceptable for Xpert Xpress SARS-CoV-2/FLU/RSV testing.  Fact Sheet for Patients: BloggerCourse.com  Fact Sheet for Healthcare Providers: SeriousBroker.it  This test is not yet approved or cleared by the [place] FDA and has been authorized for detection and/or diagnosis of SARS-CoV-2 by FDA under an Emergency Use Authorization (EUA). This EUA will remain in effect (meaning this test can be used) for the duration of the COVID-19 declaration under Section 564(b)(1) of the Act, 21 U.S.C. section 360bbb-3(b)(1), unless the authorization is terminated or revoked.  Performed at [facility], [address], [place], [place] [address]          Radiology Studies: VAS [place] LOWER EXTREMITY VENOUS (DVT)  Result Date: 03/05/2020  Lower Venous DVT Study Indications: Pulmonary embolism.  Risk Factors: Confirmed PE. Anticoagulation: Heparin. Limitations: Body habitus and poor ultrasound/tissue interface. Comparison Study: No prior studies. Performing Technologist: Chanda Busing RVT  Examination Guidelines: A complete evaluation includes B-mode imaging, spectral Doppler, color Doppler, and power Doppler as needed of all accessible portions of each vessel. Bilateral testing is considered an integral part of a complete examination. Limited examinations for reoccurring indications may be performed as noted. The reflux portion of the exam is performed with the patient in reverse Trendelenburg.  +---------+---------------+---------+-----------+----------+--------------+  RIGHT     Compressibility Phasicity Spontaneity Properties Thrombus Aging   +---------+---------------+---------+-----------+----------+--------------+  CFV       Full            Yes       Yes                                    +---------+---------------+---------+-----------+----------+--------------+  SFJ       Full                                                             +---------+---------------+---------+-----------+----------+--------------+  FV Prox   Full                                                             +---------+---------------+---------+-----------+----------+--------------+  FV Mid    Full                                                             +---------+---------------+---------+-----------+----------+--------------+  FV Distal Full                                                             +---------+---------------+---------+-----------+----------+--------------+  PFV       Full                                                             +---------+---------------+---------+-----------+----------+--------------+  POP       Full            Yes       Yes                                    +---------+---------------+---------+-----------+----------+--------------+  PTV       Full                                                             +---------+---------------+---------+-----------+----------+--------------+  PERO      Full                                                             +---------+---------------+---------+-----------+----------+--------------+   +---------+---------------+---------+-----------+----------+--------------+  LEFT      Compressibility Phasicity Spontaneity Properties Thrombus Aging  +---------+---------------+---------+-----------+----------+--------------+  CFV       Full            Yes       Yes                                    +---------+---------------+---------+-----------+----------+--------------+  SFJ       Full                                                              +---------+---------------+---------+-----------+----------+--------------+  FV Prox   Full                                                             +---------+---------------+---------+-----------+----------+--------------+  FV Mid    Full                                                             +---------+---------------+---------+-----------+----------+--------------+  FV Distal Full                                                             +---------+---------------+---------+-----------+----------+--------------+  PFV       Full                                                             +---------+---------------+---------+-----------+----------+--------------+  POP       Full            Yes       Yes                                    +---------+---------------+---------+-----------+----------+--------------+  PTV       Full                                                             +---------+---------------+---------+-----------+----------+--------------+  PERO      None                                             Acute           +---------+---------------+---------+-----------+----------+--------------+     Summary: RIGHT: - There is no evidence of deep vein thrombosis in the lower extremity.  - No cystic structure found in the popliteal fossa.  LEFT: - Findings consistent with acute deep vein thrombosis involving the left peroneal veins. - No cystic structure found in the popliteal fossa.  *See table(s) above for measurements and observations. Electronically signed by Heath Lark on 03/05/2020 at 10:56:31 AM.    Final    ECHOCARDIOGRAM LIMITED  Result Date: 03/04/2020    ECHOCARDIOGRAM LIMITED REPORT   Patient Name:   Drew [name] Date of Exam: 03/04/2020 Medical Rec #:  [id number]      Height:       70.0 in Accession #:    [id number]     Weight:  358.0 lb Date of Birth:  1960/09/13       BSA:          2.673 m Patient Age:    59 years       BP:           127/95 mmHg Patient Gender: M               HR:           113 bpm. Exam Location:  Inpatient Procedure: Limited Echo, Cardiac Doppler, Color Doppler and Intracardiac            Opacification Agent Indications:    I26.02 Pulmonary embolus  History:        Patient has prior history of Echocardiogram examinations, most                 recent 02/29/2020. CHF; Risk Factors:Hypertension, Diabetes,                 Dyslipidemia and Current Smoker. ETOH.  Sonographer:    Sheralyn Boatman RDCS Referring Phys: [id number] ANASTASSIA DOUTOVA  Sonographer Comments: Technically difficult study due to poor echo windows and patient is morbidly obese. Image acquisition challenging due to patient body habitus. IMPRESSIONS  1. Left ventricular ejection fraction, by estimation, is <20%. The left ventricle has severely decreased function. The left ventricle has no regional wall motion abnormalities. The left ventricular internal cavity size was moderately dilated. There is moderate left ventricular hypertrophy.  2. Right ventricular systolic function is moderately reduced. The right ventricular size is moderately enlarged. There is normal pulmonary artery systolic pressure.  3. Left atrial size was moderately dilated.  4. Right atrial size was moderately dilated.  5. The mitral valve is normal in structure. No evidence of mitral valve regurgitation. No evidence of mitral stenosis.  6. The aortic valve is normal in structure. Aortic valve regurgitation is not visualized. No aortic stenosis is present.  7. The inferior vena cava is dilated in size with <50% respiratory variability, suggesting right atrial pressure of 15 mmHg. Comparison(s): Prior images reviewed side by side. Changes from prior study are noted. FINDINGS  Left Ventricle: Left ventricular ejection fraction, by estimation, is <20%. The left ventricle has severely decreased function. The left ventricle has no regional wall motion abnormalities. Definity contrast agent was given IV to delineate the left ventricular  endocardial borders. The left ventricular internal cavity size was moderately dilated. There is moderate left ventricular hypertrophy. Right Ventricle: The right ventricular size is moderately enlarged. No increase in right ventricular wall thickness. Right ventricular systolic function is moderately reduced. There is normal pulmonary artery systolic pressure. The tricuspid regurgitant velocity is 1.93 m/s, and with an assumed right atrial pressure of 3 mmHg, the estimated right ventricular systolic pressure is 17.9 mmHg. Left Atrium: Left atrial size was moderately dilated. Right Atrium: Right atrial size was moderately dilated. Pericardium: There is no evidence of pericardial effusion. Mitral Valve: The mitral valve is normal in structure. No evidence of mitral valve stenosis. Tricuspid Valve: The tricuspid valve is normal in structure. Tricuspid valve regurgitation is mild . No evidence of tricuspid stenosis. Aortic Valve: The aortic valve is normal in structure. Aortic valve regurgitation is not visualized. No aortic stenosis is present. Pulmonic Valve: The pulmonic valve was normal in structure. Pulmonic valve regurgitation is not visualized. No evidence of pulmonic stenosis. Aorta: The aortic root is normal in size and structure. Venous: The inferior vena cava is dilated in size with less than  50% respiratory variability, suggesting right atrial pressure of 15 mmHg. IAS/Shunts: No atrial level shunt detected by color flow Doppler. LEFT VENTRICLE PLAX 2D LVIDd:         6.60 cm LVIDs:         6.00 cm LV PW:         1.70 cm LV IVS:        1.50 cm LVOT diam:     2.30 cm LVOT Area:     4.15 cm  LV Volumes (MOD) LV vol d, MOD A4C: 136.0 ml LV vol s, MOD A4C: 107.0 ml LV SV MOD A4C:     136.0 ml RIGHT VENTRICLE         IVC TAPSE (M-mode): 1.9 cm  IVC diam: 2.60 cm LEFT ATRIUM         Index      RIGHT ATRIUM           Index LA diam:    4.30 cm 1.61 cm/m RA Area:     24.40 cm                                RA  Volume:   82.40 ml  30.82 ml/m   AORTA Ao Root diam: 3.80 cm TRICUSPID VALVE TR Peak grad:   14.9 mmHg TR Vmax:        193.00 cm/s  SHUNTS Systemic Diam: 2.30 cm Donato Schultz MD Electronically signed by Donato Schultz MD Signature Date/Time: 03/04/2020/2:14:10 PM    Final         Scheduled Meds:  allopurinol  100 mg Oral Daily   carvedilol  6.25 mg Oral BID WC   colchicine  0.6 mg Oral Daily   digoxin  0.125 mg Oral Daily   furosemide  40 mg Intravenous BID   predniSONE  40 mg Oral Q breakfast   sacubitril-valsartan  1 tablet Oral BID   sodium chloride flush  3 mL Intravenous Q12H   spironolactone  12.5 mg Oral Daily   Continuous Infusions:  sodium chloride     amiodarone 30 mg/hr (03/06/20 0552)   heparin 2,600 Units/hr (03/06/20 0124)     LOS: 3 days    Time spent:35 minutes     Drew Tilghman A Gorgeous [place], MD [facility]   If 7PM-7AM, please contact night-coverage [email or web address]  03/06/2020, 10:09 AM

## 2020-03-06 NOTE — Progress Notes (Signed)
Patient ID: Drew [name], male   DOB: 05/29/1960, 60 y.o.   MRN: [id number]     Advanced Heart Failure Rounding Note  PCP-Cardiologist: Kardie Tobb, DO   Subjective:    No dyspnea at rest.  No further chest pain.  He remains in atrial flutter but rate down to 80s on amiodarone gtt. He has not been out of bed.  Ankle pain is improved.   He remains on heparin gtt for PE.    Objective:   Weight Range: (!) 165 kg Body mass index is 52.19 kg/m.   Vital Signs:   Temp:  [98 F (36.7 C)-98.6 F (37 C)] 98.1 F (36.7 C) (02/20 1050) Pulse Rate:  [61-90] 71 (02/20 1050) Resp:  [14-22] 20 (02/20 1050) BP: (107-134)/(67-94) 107/74 (02/20 1050) SpO2:  [94 %-96 %] 95 % (02/20 1050) Weight:  [165 kg] 165 kg (02/20 0500) Last BM Date: 03/05/20  Weight change: Filed Weights   03/03/20 1020 03/06/20 0500  Weight: (!) 162.4 kg (!) 165 kg    Intake/Output:   Intake/Output Summary (Last 24 hours) at 03/06/2020 1108 Last data filed at 03/06/2020 0800 Gross per 24 hour  Intake 816.92 ml  Output 1100 ml  Net -283.08 ml      Physical Exam    General: NAD Neck: No JVD, no thyromegaly or thyroid nodule.  Lungs: Clear to auscultation bilaterally with normal respiratory effort. CV: Nondisplaced PMI.  Heart regular S1/S2, no S3/S4, no murmur.  No peripheral edema.  \ Abdomen: Soft, nontender, no hepatosplenomegaly, no distention.  Skin: Intact without lesions or rashes.  Neurologic: Alert and oriented x 3.  Psych: Normal affect. Extremities: No clubbing or cyanosis.  HEENT: Normal.    Telemetry   Typical atrial flutter rate 80s (personally reviewed)  Labs    CBC Recent Labs    03/04/20 0000 03/05/20 0333 03/06/20 0353  WBC 7.8 6.5 8.1  NEUTROABS 4.7  --   --   HGB 12.5* 11.1* 11.3*  HCT 38.6* 33.0* 34.6*  MCV 91.7 91.9 92.0  PLT 328 319 383   Basic Metabolic Panel Recent Labs    03/04/20 0000 03/05/20 0333  NA 136 138  K 3.1* 3.7  CL 99 103  CO2 26 22   GLUCOSE 108* 109*  BUN 19 14  CREATININE 1.24 1.21  CALCIUM 8.9 8.6*  MG 2.5*  --   PHOS 3.9  --    Liver Function Tests Recent Labs    03/04/20 0000  AST 32  ALT 28  ALKPHOS 61  BILITOT 1.2  PROT 7.5  ALBUMIN 3.5   No results for input(s): LIPASE, AMYLASE in the last 72 hours. Cardiac Enzymes No results for input(s): CKTOTAL, CKMB, CKMBINDEX, TROPONINI in the last 72 hours.  BNP: BNP (last 3 results) Recent Labs    11/13/19 1456 03/04/20 0000  BNP 8 27.0    ProBNP (last 3 results) Recent Labs    03/16/19 1026 06/30/19 1039  PROBNP 39.0 45.0     D-Dimer No results for input(s): DDIMER in the last 72 hours. Hemoglobin A1C No results for input(s): HGBA1C in the last 72 hours. Fasting Lipid Panel No results for input(s): CHOL, HDL, LDLCALC, TRIG, CHOLHDL, LDLDIRECT in the last 72 hours. Thyroid Function Tests Recent Labs    03/04/20 0831 03/06/20 0353  TSH  --  4.875*  T3FREE 2.2  --     Other results:   Imaging    No results found.   Medications:       Scheduled Medications: . allopurinol  100 mg Oral Daily  . apixaban  10 mg Oral BID   Followed by  . [START ON 03/13/2020] apixaban  5 mg Oral BID  . carvedilol  6.25 mg Oral BID WC  . colchicine  0.6 mg Oral Daily  . dapagliflozin propanediol  10 mg Oral Daily  . digoxin  0.125 mg Oral Daily  . predniSONE  40 mg Oral Q breakfast  . sacubitril-valsartan  1 tablet Oral BID  . sodium chloride flush  3 mL Intravenous Q12H  . [START ON 03/07/2020] spironolactone  25 mg Oral Daily    Infusions: . sodium chloride    . amiodarone 30 mg/hr (03/06/20 0552)    PRN Medications: sodium chloride, acetaminophen **OR** acetaminophen, albuterol, HYDROcodone-acetaminophen, sodium chloride flush    Assessment/Plan   1. Right-sided PE possibly with infarction and LLE DVT: Unprovoked.  Patient has no FH of VTE.  He has not been sedentary, has been working full time at the [employer].  Echo in 2/14  with moderate RV dilation/moderate dysfunction on my read, echo today (2/18) with similar appearance of the RV.  The RV looks worse by echo now than it did on previous echo in 3/21.  I think based on symptoms that he had the PE on 2/13.  Symptoms are minimal at rest currently.  HS-TnI and BNP surprisingly normal at admission.  - Transition from heparin gtt to Eliquis today, asked pharmacy to make transition.  - He does have RV strain first noted on 2/14 echo, but I think that the PE occurred a number of days prior to admission, and suspect that thrombolysis is not warranted at this point.  2. Atrial flutter: Typical flutter.  This was noted first at time of echo on 2/14.  Suspect this was triggered by PE.  He is now rate-controlled on amiodarone gtt. - Continue amiodarone gtt for rate control.  - Continue anticoagulation.   - TEE-guided DCCV tomorrow.  Discussed risks/benefits with patient and he agrees to procedure.   3. Acute on chronic systolic CHF: Echo from 2/18 was reviewed, shows EF around 20% with moderate RV dysfunction and dilated RV.  Cath from 5/20 with no significant CAD.  Cause of CMP is uncertain, EF has been low since 2020.  NICM.  ?Viral myocarditis.  Volume status looks better today with diuresis yesterday.   - Can continue Coreg at lower dose 6.25 mg bid.  - Increase spironolactone to 25 mg daily.  - Continue digoxin 0.125 to help with rate control and RV function.  - Got IV Lasix today, volume looks better.  Can stop.    - Continue Entresto 24/26 bid. - Should have cardiac MRI eventually.  - Add dapagliflozin 10 mg daily.  4. Gout: Flare in left foot, now improved.  - He is on allopurinol chronically.  - Added colchicine and and a burst of prednisone.   Out of bed, ambulate.   Length of Stay: 3  [name], MD  03/06/2020, 11:08 AM  Advanced Heart Failure Team Pager [phone] (M-F; 7a - 4p)  Please contact [facility] for night-coverage after hours (4p -7a ) and  weekends on [email or web address]

## 2020-03-07 ENCOUNTER — Encounter (HOSPITAL_COMMUNITY)
Admission: EM | Disposition: A | Discharge status: Home / Self Care | Payer: Self-pay | Source: Home / Self Care | Attending: Internal Medicine

## 2020-03-07 ENCOUNTER — Inpatient Hospital Stay (HOSPITAL_COMMUNITY): Payer: Federal, State, Local not specified - PPO

## 2020-03-07 ENCOUNTER — Inpatient Hospital Stay (HOSPITAL_COMMUNITY): Payer: Federal, State, Local not specified - PPO | Admitting: Certified Registered Nurse Anesthetist

## 2020-03-07 ENCOUNTER — Encounter (HOSPITAL_COMMUNITY): Payer: Self-pay | Admitting: Internal Medicine

## 2020-03-07 DIAGNOSIS — I2699 Other pulmonary embolism without acute cor pulmonale: Secondary | ICD-10-CM | POA: Diagnosis not present

## 2020-03-07 DIAGNOSIS — I5022 Chronic systolic (congestive) heart failure: Secondary | ICD-10-CM | POA: Diagnosis not present

## 2020-03-07 DIAGNOSIS — I483 Typical atrial flutter: Secondary | ICD-10-CM | POA: Diagnosis not present

## 2020-03-07 DIAGNOSIS — I10 Essential (primary) hypertension: Secondary | ICD-10-CM

## 2020-03-07 DIAGNOSIS — I4892 Unspecified atrial flutter: Secondary | ICD-10-CM

## 2020-03-07 HISTORY — PX: TEE WITHOUT CARDIOVERSION: SHX5443

## 2020-03-07 HISTORY — PX: CARDIOVERSION: SHX1299

## 2020-03-07 LAB — BASIC METABOLIC PANEL
Anion gap: 10 (ref 5–15)
BUN: 16 mg/dL (ref 6–20)
CO2: 23 mmol/L (ref 22–32)
Calcium: 8.9 mg/dL (ref 8.9–10.3)
Chloride: 105 mmol/L (ref 98–111)
Creatinine, Ser: 1.22 mg/dL (ref 0.61–1.24)
GFR, Estimated: >60 mL/min (ref >60)
Glucose, Bld: 114 mg/dL — ABNORMAL HIGH (ref 70–99)
Potassium: 3.9 mmol/L (ref 3.5–5.1)
Sodium: 138 mmol/L (ref 135–145)

## 2020-03-07 LAB — CBC
HCT: 37 % — ABNORMAL LOW (ref 39.0–52.0)
Hemoglobin: 12.5 g/dL — ABNORMAL LOW (ref 13.0–17.0)
MCH: 30.6 pg (ref 26.0–34.0)
MCHC: 33.8 g/dL (ref 30.0–36.0)
MCV: 90.7 fL (ref 80.0–100.0)
Platelets: 352 10*3/uL (ref 150–400)
RBC: 4.08 MIL/uL — ABNORMAL LOW (ref 4.22–5.81)
RDW: 13.3 % (ref 11.5–15.5)
WBC: 8.5 10*3/uL (ref 4.0–10.5)
nRBC: 0 % (ref 0.0–0.2)

## 2020-03-07 LAB — HEPARIN LEVEL (UNFRACTIONATED): Heparin Unfractionated: >2.2 IU/mL — ABNORMAL HIGH (ref 0.30–0.70)

## 2020-03-07 SURGERY — ECHOCARDIOGRAM, TRANSESOPHAGEAL
Anesthesia: General

## 2020-03-07 MED ORDER — GLYCOPYRROLATE PF 0.2 MG/ML IJ SOSY
PREFILLED_SYRINGE | INTRAMUSCULAR | Status: DC | PRN
  Administered 2020-03-07 (×2): .2 mg via INTRAVENOUS

## 2020-03-07 MED ORDER — BUTAMBEN-TETRACAINE-BENZOCAINE 2-2-14 % EX AERO
INHALATION_SPRAY | CUTANEOUS | Status: DC | PRN
  Administered 2020-03-07: 2 via TOPICAL

## 2020-03-07 MED ORDER — PROPOFOL 500 MG/50ML IV EMUL
INTRAVENOUS | Status: DC | PRN
  Administered 2020-03-07: 75 ug/kg/min via INTRAVENOUS

## 2020-03-07 MED ORDER — PROPOFOL 10 MG/ML IV BOLUS
INTRAVENOUS | Status: DC | PRN
  Administered 2020-03-07: 30 mg via INTRAVENOUS
  Administered 2020-03-07: 15 mg via INTRAVENOUS
  Administered 2020-03-07: 20 mg via INTRAVENOUS

## 2020-03-07 MED ORDER — LACTATED RINGERS IV SOLN: INTRAVENOUS | Status: DC | PRN

## 2020-03-07 MED ORDER — KETAMINE HCL 50 MG/5ML IJ SOSY
PREFILLED_SYRINGE | INTRAMUSCULAR | Status: AC
  Filled 2020-03-07: qty 5

## 2020-03-07 MED ORDER — KETAMINE HCL 10 MG/ML IJ SOLN
INTRAMUSCULAR | Status: DC | PRN
  Administered 2020-03-07: 20 mg via INTRAVENOUS

## 2020-03-07 MED ORDER — LIDOCAINE 2% (20 MG/ML) 5 ML SYRINGE
INTRAMUSCULAR | Status: DC | PRN
  Administered 2020-03-07 (×2): 20 mg via INTRAVENOUS

## 2020-03-07 NOTE — Anesthesia Preprocedure Evaluation (Signed)
Anesthesia Evaluation  Patient identified by MRN, date of birth, ID band Patient awake    Reviewed: Allergy & Precautions, NPO status , Patient's Chart, lab work & pertinent test results, reviewed documented beta blocker date and time   Airway Mallampati: III  TM Distance: >3 FB Neck ROM: Full    Dental  (+) Dental Advisory Given, Poor Dentition, Loose, Missing,    Pulmonary shortness of breath and with exertion, former smoker, PE   Pulmonary exam normal breath sounds clear to auscultation       Cardiovascular hypertension, Pt. on medications and Pt. on home beta blockers +CHF  + dysrhythmias Atrial Fibrillation  Rhythm:Irregular Rate:Normal  Hx/o dilated CM  EKG 03/06/20 Atrial flutter with variable block  Echo 03/04/20 1. Left ventricular ejection fraction, by estimation, is <20%. The left ventricle has severely decreased function. The left ventricle has no regional wall motion abnormalities. The left ventricular internal cavity  size was moderately dilated. There is moderate left ventricular hypertrophy.  2. Right ventricular systolic function is moderately reduced. The right ventricular size is moderately enlarged. There is normal pulmonary artery systolic pressure.  3. Left atrial size was moderately dilated.  4. Right atrial size was moderately dilated.  5. The mitral valve is normal in structure. No evidence of mitral valve regurgitation. No evidence of mitral stenosis.  6. The aortic valve is normal in structure. Aortic valve regurgitation is not visualized. No aortic stenosis is present.  7. The inferior vena cava is dilated in size with <50% respiratory variability, suggesting right atrial pressure of 15 mmHg.   Cardiac Cath 05/26/18  Right dominant coronary anatomy with widely patent left main, LAD, circumflex, and RCA.  Acute on chronic systolic heart failure with mildly to moderately elevated filling pressures  mean wedge 20 mmHg and mean LVEDP 24 mmHg.  Echo documented LVEF less than 30%.  Mild to moderate pulmonary hypertension, cardiac output by Fick 7.7 L/min.  Right atrial mean 6 mmHg.  No evidence of right heart failure.     Neuro/Psych negative neurological ROS  negative psych ROS   GI/Hepatic negative GI ROS, (+)     substance abuse  alcohol use,   Endo/Other  Morbid obesityGout  Renal/GU Renal disease  negative genitourinary   Musculoskeletal negative musculoskeletal ROS (+)   Abdominal (+) + obese,   Peds  Hematology  (+) anemia , Heparin therapy   Anesthesia Other Findings   Reproductive/Obstetrics                             Anesthesia Physical Anesthesia Plan  ASA: III  Anesthesia Plan: General   Post-op Pain Management:    Induction: Intravenous  PONV Risk Score and Plan: 2 and Propofol infusion and Treatment may vary due to age or medical condition  Airway Management Planned: Natural Airway and Nasal Cannula  Additional Equipment:   Intra-op Plan:   Post-operative Plan: Extubation in OR  Informed Consent: I have reviewed the patients History and Physical, chart, labs and discussed the procedure including the risks, benefits and alternatives for the proposed anesthesia with the patient or authorized representative who has indicated his/her understanding and acceptance.     Dental advisory given  Plan Discussed with: CRNA and Anesthesiologist  Anesthesia Plan Comments:         Anesthesia Quick Evaluation

## 2020-03-07 NOTE — Interval H&P Note (Signed)
History and Physical Interval Note:  03/07/2020 1:39 PM  Drew [name]  has presented today for surgery, with the diagnosis of afib.  The various methods of treatment have been discussed with the patient and family. After consideration of risks, benefits and other options for treatment, the patient has consented to  Procedure(s): TRANSESOPHAGEAL ECHOCARDIOGRAM (TEE) (N/A) CARDIOVERSION (N/A) as a surgical intervention.  The patient's history has been reviewed, patient examined, no change in status, stable for surgery.  I have reviewed the patient's chart and labs.  Questions were answered to the patient's satisfaction.     Graclynn Vanantwerp [employer]

## 2020-03-07 NOTE — Transfer of Care (Addendum)
Immediate Anesthesia Transfer of Care Note  Patient: Drew [name]  Procedure(s) Performed: TRANSESOPHAGEAL ECHOCARDIOGRAM (TEE) (N/A ) CARDIOVERSION (N/A )  Patient Location: PACU and Endoscopy Unit  Anesthesia Type:General  Level of Consciousness: drowsy, patient cooperative and responds to stimulation  Airway & Oxygen Therapy: Patient Spontanous Breathing and Patient connected to nasal cannula oxygen  Post-op Assessment: Report given to RN and Post -op Vital signs reviewed and stable  Post vital signs: Reviewed and stable  Last Vitals:  Vitals Value Taken Time  BP 130/85 03/07/20 1408  Temp    Pulse 87 03/07/20 1408  Resp 27 03/07/20 1408  SpO2 96 % 03/07/20 1408  Vitals shown include unvalidated device data.  Last Pain:  Vitals:   03/07/20 1312  TempSrc: Axillary  PainSc: 0-No pain      Patients Stated Pain Goal: 2 (03/03/20 2145)  Complications: No complications documented.

## 2020-03-07 NOTE — Anesthesia Postprocedure Evaluation (Signed)
Anesthesia Post Note  Patient: Drew [name]  Procedure(s) Performed: TRANSESOPHAGEAL ECHOCARDIOGRAM (TEE) (N/A ) CARDIOVERSION (N/A )     Patient location during evaluation: PACU Anesthesia Type: General Level of consciousness: awake and alert and oriented Pain management: pain level controlled Vital Signs Assessment: post-procedure vital signs reviewed and stable Respiratory status: spontaneous breathing, nonlabored ventilation and respiratory function stable Cardiovascular status: blood pressure returned to baseline and stable Postop Assessment: no apparent nausea or vomiting Anesthetic complications: no   No complications documented.  Last Vitals:  Vitals:   03/07/20 1419 03/07/20 1429  BP: 129/85 128/88  Pulse: 87 84  Resp: 20 18  Temp:    SpO2: 96% 98%    Last Pain:  Vitals:   03/07/20 1429  TempSrc:   PainSc: 0-No pain                 Kinzy Weyers A.

## 2020-03-07 NOTE — CV Procedure (Signed)
Procedure: TEE  Sedation: Per anesthesiology.   Indication: Atrial flutter  Findings: Please see echo section for full report.  Moderately dilated LV with normal wall thickness.  EF 20-25%, diffuse hypokinesis.  Mildly dilated right ventricle with moderate systolic dysfunction.  Moderate left atrial enlargement with no LA appendage thrombus.  Mildly dilated RA. No PFO or ASD by color doppler.  Trivial MR.  Trivial TR.  Trileaflet aortic valve with no significant stenosis or regurgitation. Normal caliber thoracic aorta with minimal plaque.   May proceed with DCCV.   Drew [name] 03/07/2020 2:04 PM

## 2020-03-07 NOTE — Procedures (Signed)
Electrical Cardioversion Procedure Note Drew [name] [id number] 08/09/1960  Procedure: Electrical Cardioversion Indications:  Atrial Flutter  Procedure Details Consent: Risks of procedure as well as the alternatives and risks of each were explained to the (patient/caregiver).  Consent for procedure obtained. Time Out: Verified patient identification, verified procedure, site/side was marked, verified correct patient position, special equipment/implants available, medications/allergies/relevent history reviewed, required imaging and test results available.  Performed  Patient placed on cardiac monitor, pulse oximetry, supplemental oxygen as necessary.  Sedation given: Propofol + ketamine per anesthesiology. Pacer pads placed anterior and posterior chest.  Cardioverted 1 time(s).  Cardioverted at 200J.  Evaluation Findings: Post procedure EKG shows: NSR Complications: None Patient did tolerate procedure well.   Drew [name] 03/07/2020, 2:04 PM

## 2020-03-07 NOTE — Progress Notes (Addendum)
Patient ID: Drew [name], male   DOB: 1960/02/22, 60 y.o.   MRN: [id number]     Advanced Heart Failure Rounding Note  PCP-Cardiologist: Kardie Tobb, DO   Subjective:    Remains in AFL. On for TEE/ DCCV today.   Wt stable off IV diuretics at 363 lb.   Objective:   Weight Range: (!) 164.8 kg Body mass index is 52.13 kg/m.   Vital Signs:   Temp:  [97.6 F (36.4 C)-98.5 F (36.9 C)] 97.6 F (36.4 C) (02/21 0736) Pulse Rate:  [35-117] 80 (02/21 1015) Resp:  [14-23] 17 (02/21 0736) BP: (101-136)/(69-102) 127/80 (02/21 0736) SpO2:  [91 %-95 %] 94 % (02/21 0736) Weight:  [164.8 kg] 164.8 kg (02/21 0500) Last BM Date: 03/05/20  Weight change: Filed Weights   03/03/20 1020 03/06/20 0500 03/07/20 0500  Weight: (!) 162.4 kg (!) 165 kg (!) 164.8 kg    Intake/Output:   Intake/Output Summary (Last 24 hours) at 03/07/2020 1206 Last data filed at 03/07/2020 1000 Gross per 24 hour  Intake 1378.24 ml  Output 750 ml  Net 628.24 ml      Physical Exam     General:  Well appearing, obese. No respiratory difficulty HEENT: normal Neck: supple. no JVD. Carotids 2+ bilat; no bruits. No lymphadenopathy or thyromegaly appreciated. Cor: PMI nondisplaced. Irregular rhythm, regular rate. No rubs, gallops or murmurs. Lungs: clear Abdomen: soft, nontender, nondistended. No hepatosplenomegaly. No bruits or masses. Good bowel sounds. Extremities: no cyanosis, clubbing, rash, edema Neuro: alert & oriented x 3, cranial nerves grossly intact. moves all 4 extremities w/o difficulty. Affect pleasant.     Telemetry   Typical atrial flutter rate 80s (personally reviewed)  Labs    CBC Recent Labs    03/06/20 0353 03/07/20 0407  WBC 8.1 8.5  HGB 11.3* 12.5*  HCT 34.6* 37.0*  MCV 92.0 90.7  PLT 383 352   Basic Metabolic Panel Recent Labs    11/55/20 0333  NA 138  K 3.7  CL 103  CO2 22  GLUCOSE 109*  BUN 14  CREATININE 1.21  CALCIUM 8.6*   Liver Function Tests No results  for input(s): AST, ALT, ALKPHOS, BILITOT, PROT, ALBUMIN in the last 72 hours. No results for input(s): LIPASE, AMYLASE in the last 72 hours. Cardiac Enzymes No results for input(s): CKTOTAL, CKMB, CKMBINDEX, TROPONINI in the last 72 hours.  BNP: BNP (last 3 results) Recent Labs    11/13/19 1456 03/04/20 0000  BNP 8 27.0    ProBNP (last 3 results) Recent Labs    03/16/19 1026 06/30/19 1039  PROBNP 39.0 45.0     D-Dimer No results for input(s): DDIMER in the last 72 hours. Hemoglobin A1C No results for input(s): HGBA1C in the last 72 hours. Fasting Lipid Panel No results for input(s): CHOL, HDL, LDLCALC, TRIG, CHOLHDL, LDLDIRECT in the last 72 hours. Thyroid Function Tests Recent Labs    03/06/20 0353  TSH 4.875*    Other results:   Imaging    No results found.   Medications:     Scheduled Medications: . allopurinol  100 mg Oral Daily  . apixaban  10 mg Oral BID   Followed by  . [START ON 03/13/2020] apixaban  5 mg Oral BID  . carvedilol  6.25 mg Oral BID WC  . colchicine  0.6 mg Oral Daily  . dapagliflozin propanediol  10 mg Oral Daily  . digoxin  0.125 mg Oral Daily  . sacubitril-valsartan  1 tablet Oral BID  .  sodium chloride flush  3 mL Intravenous Q12H  . spironolactone  25 mg Oral Daily    Infusions: . sodium chloride    . sodium chloride 20 mL/hr at 03/06/20 2156  . amiodarone 30 mg/hr (03/07/20 0344)    PRN Medications: sodium chloride, acetaminophen **OR** acetaminophen, albuterol, HYDROcodone-acetaminophen, sodium chloride flush    Assessment/Plan   1. Right-sided PE possibly with infarction and LLE DVT: Unprovoked.  Patient has no FH of VTE.  He has not been sedentary, has been working full time at the [employer].  Echo in 2/14 with moderate RV dilation/moderate dysfunction on my read, echo today (2/18) with similar appearance of the RV.  The RV looks worse by echo now than it did on previous echo in 3/21.  I think based on symptoms  that he had the PE on 2/13.  Symptoms are minimal at rest currently.  HS-TnI and BNP surprisingly normal at admission.  - Initially placed on IV heparin, now on Eliquis (PE treatment dosing) - He does have RV strain first noted on 2/14 echo, but I think that the PE occurred a number of days prior to admission, and suspect that thrombolysis is not warranted at this point.  2. Atrial flutter: Typical flutter.  This was noted first at time of echo on 2/14.  Suspect this was triggered by PE.  He is now rate-controlled on amiodarone gtt. - Continue amiodarone gtt for rate control.  - Continue anticoagulation.   - Plan TEE-guided DCCV today  Discussed risks/benefits with patient and he agrees to procedure.   3. Acute on chronic systolic CHF: Echo from 2/18 was reviewed, shows EF around 20% with moderate RV dysfunction and dilated RV.  Cath from 5/20 with no significant CAD.  Cause of CMP is uncertain, EF has been low since 2020.  NICM.  ?Viral myocarditis.  Volume status improved with diuresis. Now off IV Lasix. Wt stable - Can continue Coreg at lower dose 6.25 mg bid.  - Continue spironolactone 25 mg daily.  - Continue digoxin 0.125 to help with rate control and RV function. = - Continue Entresto 24/26 bid. - Should have cardiac MRI eventually.  - Continue dapagliflozin 10 mg daily.  4. Gout: Flare in left foot, now improved.  - He is on allopurinol chronically.  - Added colchicine and and a burst of prednisone.    Length of Stay: [address], [place]  03/07/2020, 12:06 PM  Advanced Heart Failure Team Pager [phone] (M-F; 7a - 4p)  Please contact [facility] for night-coverage after hours (4p -7a ) and weekends on [email or web address]  Patient seen with PA, agree with the above note.   Feels ok, now on apixaban.  TEE-guided DCCV back to NSR today.  Echo with moderate LV dilation, EF 20-25% diffuse hypokinesis, moderately decreased RV systolic function.   General: NAD Neck: JVP 8 cm, no  thyromegaly or thyroid nodule.  Lungs: Clear to auscultation bilaterally with normal respiratory effort. CV: Nondisplaced PMI.  Heart regular S1/S2, no S3/S4, no murmur.  No peripheral edema.   Abdomen: Soft, nontender, no hepatosplenomegaly, no distention.  Skin: Intact without lesions or rashes.  Neurologic: Alert and oriented x 3.  Psych: Normal affect. Extremities: No clubbing or cyanosis.  HEENT: Normal.   Patient is now back in NSR after DCCV.  Continue apixaban.   Will continue current cardiac meds but will add back torsemide 80 mg daily (home dosing).   [name] 03/07/2020 2:09 PM

## 2020-03-07 NOTE — Discharge Instructions (Signed)
Information on my medicine - ELIQUIS (apixaban)  This medication education was reviewed with me or my healthcare representative as part of my discharge preparation.  Why was Eliquis prescribed for you? Eliquis was prescribed to treat blood clots that may have been found in the veins of your legs (deep vein thrombosis) or in your lungs (pulmonary embolism) and to reduce the risk of them occurring again.  What do You need to know about Eliquis ? The starting dose is 10 mg (two 5 mg tablets) taken TWICE daily for the FIRST SEVEN (7) DAYS, then on February 27th  the dose is reduced to ONE 5 mg tablet taken TWICE daily.  Eliquis may be taken with or without food.   Try to take the dose about the same time in the morning and in the evening. If you have difficulty swallowing the tablet whole please discuss with your pharmacist how to take the medication safely.  Take Eliquis exactly as prescribed and DO NOT stop taking Eliquis without talking to the doctor who prescribed the medication.  Stopping may increase your risk of developing a new blood clot.  Refill your prescription before you run out.  After discharge, you should have regular check-up appointments with your healthcare provider that is prescribing your Eliquis.    What do you do if you miss a dose? If a dose of ELIQUIS is not taken at the scheduled time, take it as soon as possible on the same day and twice-daily administration should be resumed. The dose should not be doubled to make up for a missed dose.  Important Safety Information A possible side effect of Eliquis is bleeding. You should call your healthcare provider right away if you experience any of the following: ? Bleeding from an injury or your nose that does not stop. ? Unusual colored urine (red or dark brown) or unusual colored stools (red or black). ? Unusual bruising for unknown reasons. ? A serious fall or if you hit your head (even if there is no  bleeding).  Some medicines may interact with Eliquis and might increase your risk of bleeding or clotting while on Eliquis. To help avoid this, consult your healthcare provider or pharmacist prior to using any new prescription or non-prescription medications, including herbals, vitamins, non-steroidal anti-inflammatory drugs (NSAIDs) and supplements.  This website has more information on Eliquis (apixaban): http://www.eliquis.com/eliquis/home

## 2020-03-07 NOTE — Progress Notes (Signed)
  Echocardiogram Echocardiogram Transesophageal has been performed.  Drew [name] 03/07/2020, 2:25 PM

## 2020-03-07 NOTE — Progress Notes (Signed)
On call notified of patient being sinus brady 45's asymptomatic.  On call stated to hold amiodarone  Infusion at this time. If patient HR sustains 120's then to restart drip.

## 2020-03-07 NOTE — Progress Notes (Signed)
CARDIAC REHAB PHASE I   PRE:  Rate/Rhythm: 67 aflutter  BP:  Supine:   Sitting: 121/78  Standing:    SaO2: 92%RA  MODE:  Ambulation: 300 ft   POST:  Rate/Rhythm: 119 aflutter  BP:  Supine:   Sitting: 129/76  Standing:    SaO2: 87-93%RA 1112-1150 Pt walked 300 ft on RA with steady gait. I managed IV pole. Pt with some DOE by end of walk and sats at 87%. Pt sat on side of bed and did pursed lip breathing and sats quickly to 90-93%. Back to bed. Will continue to see. Left on RA.  Tolerated well.    Luetta Nutting, RN BSN  03/07/2020 12:06 PM

## 2020-03-07 NOTE — Progress Notes (Signed)
PROGRESS NOTE    Drew [name]  TKW:[id number] DOB: 22-Jan-1960 DOA: 03/03/2020 PCP: Esperanza Richters, PA-C   Brief Narrative: Patient is a 60 years old male with past medical history of nonischemic cardiomyopathy with last known ejection fraction of 20 to 25% on 03/2019,  heart failure with reduced ejection fraction, tobacco use, hyperlipidemia, hypertension, pulmonary hypertension, morbid obesity presented to the hospital with worsening shortness of breath especially on minimal exertion.  He was noted to be hypoxic at the Cardiology clinic and had some pleuritic chest pain as well.  Cheree Ditto was performed in the emergency department which showed findings positive for pulmonary embolism with infarcts and very small pleural effusion.  Patient was then admitted to the hospital for further evaluation and treatment.  Patient was seen by cardiology for atrial flutter with low ejection fraction.  Assessment & Plan:   Principal Problem:   Pulmonary embolism (HCC) Active Problems:   Essential hypertension   Hyperlipidemia   Tobacco abuse   Obesity, Class III, BMI 40-49.9 (morbid obesity) (HCC)   Nonischemic cardiomyopathy (HCC)   Dilated cardiomyopathy (HCC)   HTN (hypertension)   Chronic systolic CHF (congestive heart failure) (HCC)   Hypokalemia   AKI (acute kidney injury) (HCC)   Bilateral pulmonary embolism with pulmonary infarcts.  Currently on room air.  Was initially started on heparin drip on 03/03/2020.  She has been transitioned to Eliquis p.o.  2D echo at this time shows left ventricular ejection fraction of 20% with moderate Lee reduced RV function.  He was consulted who recommended indefinite anticoagulation.  Venous duplex ultrasound of the lower extremities showed left peroneal DVT.  Acute hypoxic respiratory failure secondary to pulmonary embolism. Improved.  At this time patient is on room air. on eliquis.   Hypokalemia; improved and replenished  Elevated TSH;  Has improved on  repeat.  Free T3-T4 normal.  Will need outpatient monitoring of TSH in 4 weeks.   Class III obesity; patient would benefit from weight loss  Acute on chronic systolic heart failure exacerbation. Patient was seen by heart failure team.  On Entresto spironolactone and Lasix.  Continue Coreg digoxin. on dapagliflozin.   Acute kidney injury. Creatinine has improved.  Acute gout flare on the left foot;  on colchicine, got 1 dose of prednisone..  On allopurinol chronically.  Paroxysmal A flutter; on amiodarone Gtt. cardiology on board.  Plan for cardioversion today as per cardiology team.  Continue Coreg, Eliquis  DVT prophylaxis: Eliquis  Code Status:  Full code  Family Communication:  With patient at bedside.  Disposition Plan:  Status is: Inpatient  Remains inpatient appropriate because:IV treatments appropriate due to intensity of illness or inability to take PO, cardiology on board planning for cardioversion.   Dispo: The patient is from: Home              Anticipated d/c is to: Home              Anticipated d/c date is: 2 to 3 days              Patient currently is not medically stable to d/c.   Difficult to place patient No   Consultants:   Pulmonary   Cardiology  Procedures:   2D ECHO  Doppler ultrasound of the lower extremity  Antimicrobials:  None  Subjective: Today, patient was seen and examined at bedside.  She denies any chest pain, shortness of breath, fever or chills.  Feels constipated.    Objective: Vitals:   03/07/20  0500 03/07/20 0736 03/07/20 0757 03/07/20 1015  BP:  127/80    Pulse:  (!) 40 76 80  Resp:  17    Temp:  97.6 F (36.4 C)    TempSrc:  Oral    SpO2:  94%    Weight: (!) 164.8 kg     Height:        Intake/Output Summary (Last 24 hours) at 03/07/2020 1024 Last data filed at 03/07/2020 1884 Gross per 24 hour  Intake 1078.24 ml  Output 750 ml  Net 328.24 ml   Filed Weights   03/03/20 1020 03/06/20 0500 03/07/20 0500   Weight: (!) 162.4 kg (!) 165 kg (!) 164.8 kg    Physical examination: General: Obese built, not in obvious distress, HENT:   No scleral pallor or icterus noted. Oral mucosa is moist.  Chest:    Diminished breath sounds bilaterally. No crackles or wheezes.  CVS: S1 &S2 heard. No murmur.  Regular rate and rhythm. Abdomen: Soft, nontender, nondistended.  Bowel sounds are heard.   Extremities: No cyanosis, clubbing or edema.  Peripheral pulses are palpable. Psych: Alert, awake and oriented, normal mood CNS:  No cranial nerve deficits.  Power equal in all extremities.   Skin: Warm and dry.  No rashes noted.  Data Reviewed: I have personally reviewed following labs and imaging studies  CBC: Recent Labs  Lab 03/03/20 1032 03/04/20 0000 03/05/20 0333 03/06/20 0353 03/07/20 0407  WBC 9.1 7.8 6.5 8.1 8.5  NEUTROABS  --  4.7  --   --   --   HGB 12.9* 12.5* 11.1* 11.3* 12.5*  HCT 39.1 38.6* 33.0* 34.6* 37.0*  MCV 91.6 91.7 91.9 92.0 90.7  PLT 352 328 319 383 352   Basic Metabolic Panel: Recent Labs  Lab 03/03/20 1032 03/04/20 0000 03/05/20 0333  NA 137 136 138  K 3.1* 3.1* 3.7  CL 97* 99 103  CO2 28 26 22   GLUCOSE 122* 108* 109*  BUN 28* 19 14  CREATININE 1.44* 1.24 1.21  CALCIUM 9.3 8.9 8.6*  MG  --  2.5*  --   PHOS  --  3.9  --    GFR: Estimated Creatinine Clearance: 104.5 mL/min (by C-G formula based on SCr of 1.21 mg/dL). Liver Function Tests: Recent Labs  Lab 03/04/20 0000  AST 32  ALT 28  ALKPHOS 61  BILITOT 1.2  PROT 7.5  ALBUMIN 3.5   No results for input(s): LIPASE, AMYLASE in the last 168 hours. No results for input(s): AMMONIA in the last 168 hours. Coagulation Profile: Recent Labs  Lab 03/03/20 1032 03/06/20 1448  INR 1.1 1.2   Cardiac Enzymes: No results for input(s): CKTOTAL, CKMB, CKMBINDEX, TROPONINI in the last 168 hours. BNP (last 3 results) Recent Labs    03/16/19 1026 06/30/19 1039  PROBNP 39.0 45.0   HbA1C: No results for  input(s): HGBA1C in the last 72 hours. CBG: No results for input(s): GLUCAP in the last 168 hours. Lipid Profile: No results for input(s): CHOL, HDL, LDLCALC, TRIG, CHOLHDL, LDLDIRECT in the last 72 hours. Thyroid Function Tests: Recent Labs    03/06/20 0353  TSH 4.875*   Anemia Panel: No results for input(s): VITAMINB12, FOLATE, FERRITIN, TIBC, IRON, RETICCTPCT in the last 72 hours. Sepsis Labs: Recent Labs  Lab 03/03/20 1032  LATICACIDVEN 1.3    Recent Results (from the past 240 hour(s))  Resp Panel by RT-PCR (Flu A&B, Covid) Nasopharyngeal Swab     Status: None   Collection Time:  03/03/20 12:37 PM   Specimen: Nasopharyngeal Swab; Nasopharyngeal(NP) swabs in vial transport medium  Result Value Ref Range Status   SARS Coronavirus 2 by RT PCR NEGATIVE NEGATIVE Final    Comment: (NOTE) SARS-CoV-2 target nucleic acids are NOT DETECTED.  The SARS-CoV-2 RNA is generally detectable in upper respiratory specimens during the acute phase of infection. The lowest concentration of SARS-CoV-2 viral copies this assay can detect is 138 copies/mL. A negative result does not preclude SARS-Cov-2 infection and should not be used as the sole basis for treatment or other patient management decisions. A negative result may occur with  improper specimen collection/handling, submission of specimen other than nasopharyngeal swab, presence of viral mutation(s) within the areas targeted by this assay, and inadequate number of viral copies(<138 copies/mL). A negative result must be combined with clinical observations, patient history, and epidemiological information. The expected result is Negative.  Fact Sheet for Patients:  BloggerCourse.com  Fact Sheet for Healthcare Providers:  SeriousBroker.it  This test is no t yet approved or cleared by the [place] FDA and  has been authorized for detection and/or diagnosis of SARS-CoV-2 by FDA  under an Emergency Use Authorization (EUA). This EUA will remain  in effect (meaning this test can be used) for the duration of the COVID-19 declaration under Section 564(b)(1) of the Act, 21 U.S.C.section 360bbb-3(b)(1), unless the authorization is terminated  or revoked sooner.       Influenza A by PCR NEGATIVE NEGATIVE Final   Influenza B by PCR NEGATIVE NEGATIVE Final    Comment: (NOTE) The Xpert Xpress SARS-CoV-2/FLU/RSV plus assay is intended as an aid in the diagnosis of influenza from Nasopharyngeal swab specimens and should not be used as a sole basis for treatment. Nasal washings and aspirates are unacceptable for Xpert Xpress SARS-CoV-2/FLU/RSV testing.  Fact Sheet for Patients: BloggerCourse.com  Fact Sheet for Healthcare Providers: SeriousBroker.it  This test is not yet approved or cleared by the [place] FDA and has been authorized for detection and/or diagnosis of SARS-CoV-2 by FDA under an Emergency Use Authorization (EUA). This EUA will remain in effect (meaning this test can be used) for the duration of the COVID-19 declaration under Section 564(b)(1) of the Act, 21 U.S.C. section 360bbb-3(b)(1), unless the authorization is terminated or revoked.  Performed at [facility], [address], [place], [place] [address]      Radiology Studies: No results found.   Scheduled Meds: . allopurinol  100 mg Oral Daily  . apixaban  10 mg Oral BID   Followed by  . [START ON 03/13/2020] apixaban  5 mg Oral BID  . carvedilol  6.25 mg Oral BID WC  . colchicine  0.6 mg Oral Daily  . dapagliflozin propanediol  10 mg Oral Daily  . digoxin  0.125 mg Oral Daily  . sacubitril-valsartan  1 tablet Oral BID  . sodium chloride flush  3 mL Intravenous Q12H  . spironolactone  25 mg Oral Daily   Continuous Infusions: . sodium chloride    . sodium chloride 20 mL/hr at 03/06/20 2156  . amiodarone 30 mg/hr  (03/07/20 0344)     LOS: 4 days    Joycelyn Das, MD [facility]   If 7PM-7AM, please contact night-coverage [email or web address]  03/07/2020, 10:24 AM

## 2020-03-07 NOTE — Plan of Care (Signed)

## 2020-03-08 ENCOUNTER — Encounter (HOSPITAL_COMMUNITY): Payer: Self-pay | Admitting: Cardiology

## 2020-03-08 ENCOUNTER — Inpatient Hospital Stay (HOSPITAL_COMMUNITY): Payer: Federal, State, Local not specified - PPO

## 2020-03-08 DIAGNOSIS — I5043 Acute on chronic combined systolic (congestive) and diastolic (congestive) heart failure: Secondary | ICD-10-CM

## 2020-03-08 LAB — CBC
HCT: 33.8 % — ABNORMAL LOW (ref 39.0–52.0)
Hemoglobin: 11.4 g/dL — ABNORMAL LOW (ref 13.0–17.0)
MCH: 30.6 pg (ref 26.0–34.0)
MCHC: 33.7 g/dL (ref 30.0–36.0)
MCV: 90.6 fL (ref 80.0–100.0)
Platelets: 398 10*3/uL (ref 150–400)
RBC: 3.73 MIL/uL — ABNORMAL LOW (ref 4.22–5.81)
RDW: 13.5 % (ref 11.5–15.5)
WBC: 8.8 10*3/uL (ref 4.0–10.5)
nRBC: 0 % (ref 0.0–0.2)

## 2020-03-08 LAB — BASIC METABOLIC PANEL
Anion gap: 9 (ref 5–15)
BUN: 14 mg/dL (ref 6–20)
CO2: 26 mmol/L (ref 22–32)
Calcium: 8.8 mg/dL — ABNORMAL LOW (ref 8.9–10.3)
Chloride: 103 mmol/L (ref 98–111)
Creatinine, Ser: 1.18 mg/dL (ref 0.61–1.24)
GFR, Estimated: >60 mL/min (ref >60)
Glucose, Bld: 97 mg/dL (ref 70–99)
Potassium: 3.7 mmol/L (ref 3.5–5.1)
Sodium: 138 mmol/L (ref 135–145)

## 2020-03-08 LAB — HOMOCYSTEINE: Homocysteine: 10.4 umol/L (ref 0.0–14.5)

## 2020-03-08 MED ORDER — GADOBUTROL 1 MMOL/ML IV SOLN
14.0000 mL | Freq: Once | INTRAVENOUS | Status: AC | PRN
  Administered 2020-03-08: 14 mL via INTRAVENOUS

## 2020-03-08 MED ORDER — POTASSIUM CHLORIDE CRYS ER 20 MEQ PO TBCR
20.0000 meq | EXTENDED_RELEASE_TABLET | Freq: Once | ORAL | Status: AC
  Administered 2020-03-08: 20 meq via ORAL
  Filled 2020-03-08: qty 1

## 2020-03-08 MED ORDER — TORSEMIDE 20 MG PO TABS
80.0000 mg | ORAL_TABLET | Freq: Every day | ORAL | Status: DC
  Administered 2020-03-08: 80 mg via ORAL
  Filled 2020-03-08 (×2): qty 4

## 2020-03-08 MED ORDER — POLYETHYLENE GLYCOL 3350 17 G PO PACK
17.0000 g | PACK | Freq: Every day | ORAL | Status: DC
  Filled 2020-03-08: qty 1

## 2020-03-08 MED ORDER — CARVEDILOL 3.125 MG PO TABS
3.1250 mg | ORAL_TABLET | Freq: Two times a day (BID) | ORAL | Status: DC
  Administered 2020-03-08 – 2020-03-09 (×2): 3.125 mg via ORAL
  Filled 2020-03-08 (×2): qty 1

## 2020-03-08 NOTE — Progress Notes (Signed)
PROGRESS NOTE    Drew [name]  WER:[id number] DOB: 05/29/1960 DOA: 03/03/2020 PCP: Esperanza Richters, PA-C   Brief Narrative: Patient is a 60 years old male with past medical history of nonischemic cardiomyopathy with last known ejection fraction of 20 to 25% on 03/2019,  heart failure with reduced ejection fraction, tobacco use, hyperlipidemia, hypertension, pulmonary hypertension, morbid obesity presented to the hospital with worsening shortness of breath especially on minimal exertion.  He was noted to be hypoxic at the Cardiology clinic and had some pleuritic chest pain as well. CTA was performed in the emergency department which showed findings positive for pulmonary embolism with infarcts and very small pleural effusion.  Patient was then admitted to the hospital for further evaluation and treatment.  Patient was seen by cardiology for atrial flutter with low ejection fraction.  Patient underwent TEE and DC cardioversion on 03/07/2020  Assessment & Plan:   Principal Problem:   Pulmonary embolism (HCC) Active Problems:   Essential hypertension   Hyperlipidemia   Tobacco abuse   Obesity, Class III, BMI 40-49.9 (morbid obesity) (HCC)   Nonischemic cardiomyopathy (HCC)   Dilated cardiomyopathy (HCC)   HTN (hypertension)   Chronic systolic CHF (congestive heart failure) (HCC)   Hypokalemia   AKI (acute kidney injury) (HCC)   Typical atrial flutter (HCC)   Bilateral pulmonary embolism with pulmonary infarcts.  On  Eliquis p.o.  2D echo at this time showed left ventricular ejection fraction of 20% with moderately reduced RV function, patient has been recommended indefinite anticoagulation.  Venous duplex ultrasound of the lower extremities showed left peroneal DVT.  Paroxysmal A flutter; on amiodarone Gtt. patient underwent TEE and cardioversion on 03/07/2020.  on  Coreg and Eliquis.  Cardiology on board.  Follow recommendations.  Was on amiodarone drip.  Has been discontinued.  Currently  sinus bradycardia.  Dose of the Coreg has been decreased.  Digoxin will be continued as per cardiology.  Acute on chronic systolic heart failure exacerbation. heart failure team on board..  On Entresto spironolactone and Lasix.  Continue Coreg, digoxin. on dapagliflozin.  Coreg has been decreased for  Acute hypoxic respiratory failure secondary to pulmonary embolism. Resolved at this time.  Hypokalemia; improved with replacement.  Check electrolytes closely.  Elevated TSH;   Free T3-T4 normal.  Will need outpatient monitoring of TSH in 4 weeks.   Class III obesity; patient would benefit from weight loss on a ongoing basis.  Acute kidney injury. Creatinine has improved.  Latest creatinine of 1.1  Acute gout flare on the left foot;  on colchicine, got 1 dose of prednisone..  On allopurinol chronically.  DVT prophylaxis: Eliquis  Code Status:  Full code  Family Communication:  Spoke with the patient at bedside.  Disposition Plan:  Status is: Inpatient  Remains inpatient appropriate because:IV treatments appropriate due to intensity of illness or inability to take PO, status post cardioversion,    Dispo: The patient is from: Home              Anticipated d/c is to: Home              Anticipated d/c date is: 1 to 2 days as per cardiology recommendation.              Patient currently is not medically stable to d/c.   Difficult to place patient No   Consultants:   Pulmonary   Cardiology  Procedures:   2D ECHO  Doppler ultrasound of the lower extremity  TEE and  DC cardioversion on 03/07/2020  Antimicrobials:  None  Subjective: Today, patient was seen and examined at bedside.  Patient denies any chest pain, increasing shortness of breath, fever, chills.  Nursing staff reported bradycardia.  Objective: Vitals:   03/08/20 0346 03/08/20 0350 03/08/20 0500 03/08/20 0700  BP: (!) 93/58 116/66  103/63  Pulse: 63 64  (!) 54  Resp: 16 18  15   Temp: 97.7 F (36.5 C)    97.6 F (36.4 C)  TempSrc: Oral   Oral  SpO2: 93% 96%  99%  Weight:   (!) 165.4 kg   Height:        Intake/Output Summary (Last 24 hours) at 03/08/2020 0731 Last data filed at 03/07/2020 1410 Gross per 24 hour  Intake 500 ml  Output 450 ml  Net 50 ml   Filed Weights   03/06/20 0500 03/07/20 0500 03/08/20 0500  Weight: (!) 165 kg (!) 164.8 kg (!) 165.4 kg    Physical examination: General: Obese built, not in obvious distress HENT:   No scleral pallor or icterus noted. Oral mucosa is moist.  Chest:  Clear breath sounds.  Diminished breath sounds bilaterally. No crackles or wheezes.  CVS: S1 &S2 heard. No murmur.  Regular rate and rhythm.  Bradycardia noted. Abdomen: Soft, nontender, nondistended.  Bowel sounds are heard.   Extremities: No cyanosis, clubbing or edema.  Peripheral pulses are palpable. Psych: Alert, awake and oriented, normal mood CNS:  No cranial nerve deficits.  Power equal in all extremities.   Skin: Warm and dry.  No rashes noted.  Data Reviewed: I have personally reviewed following labs and imaging studies  CBC: Recent Labs  Lab 03/04/20 0000 03/05/20 0333 03/06/20 0353 03/07/20 0407 03/08/20 0242  WBC 7.8 6.5 8.1 8.5 8.8  NEUTROABS 4.7  --   --   --   --   HGB 12.5* 11.1* 11.3* 12.5* 11.4*  HCT 38.6* 33.0* 34.6* 37.0* 33.8*  MCV 91.7 91.9 92.0 90.7 90.6  PLT 328 319 383 352 398   Basic Metabolic Panel: Recent Labs  Lab 03/03/20 1032 03/04/20 0000 03/05/20 0333 03/07/20 1209 03/08/20 0242  NA 137 136 138 138 138  K 3.1* 3.1* 3.7 3.9 3.7  CL 97* 99 103 105 103  CO2 28 26 22 23 26   GLUCOSE 122* 108* 109* 114* 97  BUN 28* 19 14 16 14   CREATININE 1.44* 1.24 1.21 1.22 1.18  CALCIUM 9.3 8.9 8.6* 8.9 8.8*  MG  --  2.5*  --   --   --   PHOS  --  3.9  --   --   --    GFR: Estimated Creatinine Clearance: 107.5 mL/min (by C-G formula based on SCr of 1.18 mg/dL). Liver Function Tests: Recent Labs  Lab 03/04/20 0000  AST 32  ALT 28   ALKPHOS 61  BILITOT 1.2  PROT 7.5  ALBUMIN 3.5   No results for input(s): LIPASE, AMYLASE in the last 168 hours. No results for input(s): AMMONIA in the last 168 hours. Coagulation Profile: Recent Labs  Lab 03/03/20 1032 03/06/20 1448  INR 1.1 1.2   Cardiac Enzymes: No results for input(s): CKTOTAL, CKMB, CKMBINDEX, TROPONINI in the last 168 hours. BNP (last 3 results) Recent Labs    03/16/19 1026 06/30/19 1039  PROBNP 39.0 45.0   HbA1C: No results for input(s): HGBA1C in the last 72 hours. CBG: No results for input(s): GLUCAP in the last 168 hours. Lipid Profile: No results for input(s): CHOL, HDL,  LDLCALC, TRIG, CHOLHDL, LDLDIRECT in the last 72 hours. Thyroid Function Tests: Recent Labs    03/06/20 0353  TSH 4.875*   Anemia Panel: No results for input(s): VITAMINB12, FOLATE, FERRITIN, TIBC, IRON, RETICCTPCT in the last 72 hours. Sepsis Labs: Recent Labs  Lab 03/03/20 1032  LATICACIDVEN 1.3    Recent Results (from the past 240 hour(s))  Resp Panel by RT-PCR (Flu A&B, Covid) Nasopharyngeal Swab     Status: None   Collection Time: 03/03/20 12:37 PM   Specimen: Nasopharyngeal Swab; Nasopharyngeal(NP) swabs in vial transport medium  Result Value Ref Range Status   SARS Coronavirus 2 by RT PCR NEGATIVE NEGATIVE Final    Comment: (NOTE) SARS-CoV-2 target nucleic acids are NOT DETECTED.  The SARS-CoV-2 RNA is generally detectable in upper respiratory specimens during the acute phase of infection. The lowest concentration of SARS-CoV-2 viral copies this assay can detect is 138 copies/mL. A negative result does not preclude SARS-Cov-2 infection and should not be used as the sole basis for treatment or other patient management decisions. A negative result may occur with  improper specimen collection/handling, submission of specimen other than nasopharyngeal swab, presence of viral mutation(s) within the areas targeted by this assay, and inadequate number of  viral copies(<138 copies/mL). A negative result must be combined with clinical observations, patient history, and epidemiological information. The expected result is Negative.  Fact Sheet for Patients:  BloggerCourse.com  Fact Sheet for Healthcare Providers:  SeriousBroker.it  This test is no t yet approved or cleared by the [place] FDA and  has been authorized for detection and/or diagnosis of SARS-CoV-2 by FDA under an Emergency Use Authorization (EUA). This EUA will remain  in effect (meaning this test can be used) for the duration of the COVID-19 declaration under Section 564(b)(1) of the Act, 21 U.S.C.section 360bbb-3(b)(1), unless the authorization is terminated  or revoked sooner.       Influenza A by PCR NEGATIVE NEGATIVE Final   Influenza B by PCR NEGATIVE NEGATIVE Final    Comment: (NOTE) The Xpert Xpress SARS-CoV-2/FLU/RSV plus assay is intended as an aid in the diagnosis of influenza from Nasopharyngeal swab specimens and should not be used as a sole basis for treatment. Nasal washings and aspirates are unacceptable for Xpert Xpress SARS-CoV-2/FLU/RSV testing.  Fact Sheet for Patients: BloggerCourse.com  Fact Sheet for Healthcare Providers: SeriousBroker.it  This test is not yet approved or cleared by the [place] FDA and has been authorized for detection and/or diagnosis of SARS-CoV-2 by FDA under an Emergency Use Authorization (EUA). This EUA will remain in effect (meaning this test can be used) for the duration of the COVID-19 declaration under Section 564(b)(1) of the Act, 21 U.S.C. section 360bbb-3(b)(1), unless the authorization is terminated or revoked.  Performed at [facility], [address], [place], [place] [address]      Radiology Studies: No results found.   Scheduled Meds: . allopurinol  100 mg Oral Daily  . apixaban   10 mg Oral BID   Followed by  . [START ON 03/13/2020] apixaban  5 mg Oral BID  . carvedilol  6.25 mg Oral BID WC  . colchicine  0.6 mg Oral Daily  . dapagliflozin propanediol  10 mg Oral Daily  . digoxin  0.125 mg Oral Daily  . sacubitril-valsartan  1 tablet Oral BID  . sodium chloride flush  3 mL Intravenous Q12H  . spironolactone  25 mg Oral Daily   Continuous Infusions: . sodium chloride    .  amiodarone Stopped (03/07/20 2301)     LOS: 5 days    Joycelyn Das, MD [facility]   If 7PM-7AM, please contact night-coverage [email or web address]  03/08/2020, 7:31 AM

## 2020-03-08 NOTE — Progress Notes (Signed)
CARDIAC REHAB PHASE I   PRE:  Rate/Rhythm: 74 SR  BP:  Supine:   Sitting: 132/84  Standing:    SaO2: 90%RA  MODE:  Ambulation: 250 ft   POST:  Rate/Rhythm: 96 SR  BP:  Supine:   Sitting: 129/80  Standing:    SaO2: 87-89%RA  Hall  90-93%RA room [id number] Gave pt CHF booklet and low sodium handouts. Discussed zones and when to call MD. Discussed importance of daily weights (pt stated he has scales at home), watching sodium at 2000 mg, and signs/symptoms of when to call MD. Pt stated his knees limit walking more than SOB so did not give walking ex ed. Pt stated he needs knee surgery. Pt walked 250 ft on RA. Encouraged pt to purse lip breathe and to stop when needed. Sats better when he stops and rests and when he takes mask down a little. Pt stated he had home oxygen in past but did not use. Remained in NSR.  Back to bed after walk and sats 90-93%.   Luetta Nutting, RN BSN  03/08/2020 1:50 PM

## 2020-03-08 NOTE — Progress Notes (Addendum)
Patient ID: Drew [name], male   DOB: 06/09/1960, 60 y.o.   MRN: [id number]     Advanced Heart Failure Rounding Note  PCP-Cardiologist: Kardie Tobb, DO   Subjective:    S/p successful TEE/ DCCV yesterday.   Remains in sinus brady in the upper 40s-mid 50s but asymptomatic.   Pleuritic CP resolved. No dyspnea.   LVEF on TEE 20-25%. RV mildly dilated w/ moderate systolic dysfunction.   Wt fairly stable, only up 1lb.   Objective:   Weight Range: (!) 165.4 kg Body mass index is 52.32 kg/m.   Vital Signs:   Temp:  [97.6 F (36.4 C)-98.3 F (36.8 C)] 97.6 F (36.4 C) (02/22 0700) Pulse Rate:  [40-88] 54 (02/22 0700) Resp:  [13-20] 15 (02/22 0700) BP: (93-144)/(58-88) 103/63 (02/22 0700) SpO2:  [91 %-99 %] 99 % (02/22 0700) Weight:  [165.4 kg] 165.4 kg (02/22 0500) Last BM Date: 03/07/20 (per patient)  Weight change: Filed Weights   03/06/20 0500 03/07/20 0500 03/08/20 0500  Weight: (!) 165 kg (!) 164.8 kg (!) 165.4 kg    Intake/Output:   Intake/Output Summary (Last 24 hours) at 03/08/2020 0823 Last data filed at 03/07/2020 1410 Gross per 24 hour  Intake 500 ml  Output 0 ml  Net 500 ml      Physical Exam    General:  Well appearing, obese. No respiratory difficulty HEENT: normal Neck: supple. Thick neck JVD assessment difficult. Carotids 2+ bilat; no bruits. No lymphadenopathy or thyromegaly appreciated. Cor: PMI nondisplaced. Regular rhythm, slow rate No rubs, gallops or murmurs. Lungs: clear Abdomen: obese, soft, nontender, nondistended. No hepatosplenomegaly. No bruits or masses. Good bowel sounds. Extremities: no cyanosis, clubbing, rash, edema Neuro: alert & oriented x 3, cranial nerves grossly intact. moves all 4 extremities w/o difficulty. Affect pleasant.    Telemetry   Sinus brady 50s  (personally reviewed)  Labs    CBC Recent Labs    03/07/20 0407 03/08/20 0242  WBC 8.5 8.8  HGB 12.5* 11.4*  HCT 37.0* 33.8*  MCV 90.7 90.6  PLT 352 398    Basic Metabolic Panel Recent Labs    78/24/23 1209 03/08/20 0242  NA 138 138  K 3.9 3.7  CL 105 103  CO2 23 26  GLUCOSE 114* 97  BUN 16 14  CREATININE 1.22 1.18  CALCIUM 8.9 8.8*   Liver Function Tests No results for input(s): AST, ALT, ALKPHOS, BILITOT, PROT, ALBUMIN in the last 72 hours. No results for input(s): LIPASE, AMYLASE in the last 72 hours. Cardiac Enzymes No results for input(s): CKTOTAL, CKMB, CKMBINDEX, TROPONINI in the last 72 hours.  BNP: BNP (last 3 results) Recent Labs    11/13/19 1456 03/04/20 0000  BNP 8 27.0    ProBNP (last 3 results) Recent Labs    03/16/19 1026 06/30/19 1039  PROBNP 39.0 45.0     D-Dimer No results for input(s): DDIMER in the last 72 hours. Hemoglobin A1C No results for input(s): HGBA1C in the last 72 hours. Fasting Lipid Panel No results for input(s): CHOL, HDL, LDLCALC, TRIG, CHOLHDL, LDLDIRECT in the last 72 hours. Thyroid Function Tests Recent Labs    03/06/20 0353  TSH 4.875*    Other results:   Imaging    No results found.   Medications:     Scheduled Medications: . allopurinol  100 mg Oral Daily  . apixaban  10 mg Oral BID   Followed by  . [START ON 03/13/2020] apixaban  5 mg Oral BID  .  carvedilol  6.25 mg Oral BID WC  . colchicine  0.6 mg Oral Daily  . dapagliflozin propanediol  10 mg Oral Daily  . digoxin  0.125 mg Oral Daily  . sacubitril-valsartan  1 tablet Oral BID  . sodium chloride flush  3 mL Intravenous Q12H  . spironolactone  25 mg Oral Daily    Infusions: . sodium chloride    . amiodarone Stopped (03/07/20 2301)    PRN Medications: sodium chloride, acetaminophen **OR** acetaminophen, albuterol, HYDROcodone-acetaminophen, sodium chloride flush    Assessment/Plan   1. Right-sided PE possibly with infarction and LLE DVT: Unprovoked.  Patient has no FH of VTE.  He has not been sedentary, has been working full time at the [employer].  Echo in 2/14 with moderate RV  dilation/moderate dysfunction on my read, echo today (2/18) with similar appearance of the RV.  The RV looks worse by echo now than it did on previous echo in 3/21.  I think based on symptoms that he had the PE on 2/13.  Symptoms are minimal at rest currently.  HS-TnI and BNP surprisingly normal at admission.  - Initially placed on IV heparin, now on Eliquis (PE treatment dosing) - He does have RV strain first noted on 2/14 echo, but I think that the PE occurred a number of days prior to admission, and suspect that thrombolysis is not warranted at this point.  2. Atrial flutter: Typical flutter.  This was noted first at time of echo on 2/14.  Suspect this was triggered by PE.   - S/p successful TEE/DCCV 2/21 - Maintaining SR - off amio w/ bradycardia. HR in the 40s-50s but asymptomatic  - Continue anticoagulation therapy w/ Eliquis   - Reduce Coreg to 3.125 bid for bradycardia and soft BP   - Will continue digoxin for now given concomitant biventricular dysfunction. Check dig level - if recurrence, consider referral to EP for AFL ablation  - recommend outpatient sleep study to r/o OSA  3. Acute on chronic systolic CHF: Echo from 2/18 was reviewed, shows EF around 20% with moderate RV dysfunction and dilated RV.  Cath from 5/20 with no significant CAD.  Cause of CMP is uncertain, EF has been low since 2020.  NICM.  ?Viral myocarditis.  Volume status improved with diuresis. Now off IV Lasix. Wt stable - Reduce Coreg further to 3.125 mg bid given bradycardia and digoxin.  - Continue spironolactone 25 mg daily.  - Continue digoxin 0.125 to help RV function. Check dig level. Monitor for further bradycardia  - Continue Entresto 24/26 bid. - Should have cardiac MRI eventually.  - Continue dapagliflozin 10 mg daily.  4. Gout: Flare in left foot, now improved.  - He is on allopurinol chronically.  - Added colchicine and and a burst of prednisone.   Length of Stay: [address], PA-C   03/08/2020, 8:23 AM  Advanced Heart Failure Team Pager [phone] (M-F; 7a - 4p)  Please contact [facility] for night-coverage after hours (4p -7a ) and weekends on [email or web address]  Patient seen with PA, agree with the above note.   No complaints today.  Ankle pain better.  Walked yesterday.  He remains in NSR with HR in 50s.    General: NAD Neck: Thick, JVP difficult, no thyromegaly or thyroid nodule.  Lungs: Clear to auscultation bilaterally with normal respiratory effort. CV: Nondisplaced PMI.  Heart regular S1/S2, no S3/S4, no murmur.  No peripheral edema.   Abdomen: Soft, nontender, no hepatosplenomegaly, no distention.  Skin: Intact without lesions or rashes.  Neurologic: Alert and oriented x 3.  Psych: Normal affect. Extremities: No clubbing or cyanosis.  HEENT: Normal.   He remains in NSR, HR 40s-50s overnight.  Will decrease Coreg to 3.125 mg bid and think he can stop amiodarone (if flutter recurs, plan ablation).   Can restart home torsemide 80 mg daily.  Will order cMRI.  He is now on apixaban for PE.   Continue other cardiac meds as ordered currently.    Think he can go home soon.   [name] 03/08/2020 9:34 AM

## 2020-03-08 NOTE — Progress Notes (Signed)
Per Benefits check   # 6.  S/W  [facility] @ [facility] RX # [phone]     1. ELIQUIS 5 MG BID  COVER- YES  CO-PAY-$ 74.96  TIER- 2 DRUG  PRIOR APPROVAL- NO   DAPAGLIFLOZIN: NON-FORMULARY   1. FARXIGA 10 MG DAILY  WILL NEED AN OVERRIDE # [phone] - prior auth   DEDUCTIBLE : UNMET  OUT-OF-POCKET:UNMET    PREFERRED PHARMACY : YES  [facility]

## 2020-03-09 ENCOUNTER — Other Ambulatory Visit: Payer: Self-pay | Admitting: Internal Medicine

## 2020-03-09 ENCOUNTER — Telehealth (HOSPITAL_COMMUNITY): Payer: Self-pay | Admitting: Pharmacy Technician

## 2020-03-09 DIAGNOSIS — I5043 Acute on chronic combined systolic (congestive) and diastolic (congestive) heart failure: Secondary | ICD-10-CM

## 2020-03-09 DIAGNOSIS — I483 Typical atrial flutter: Secondary | ICD-10-CM

## 2020-03-09 LAB — CBC
HCT: 35.1 % — ABNORMAL LOW (ref 39.0–52.0)
Hemoglobin: 11.8 g/dL — ABNORMAL LOW (ref 13.0–17.0)
MCH: 30.4 pg (ref 26.0–34.0)
MCHC: 33.6 g/dL (ref 30.0–36.0)
MCV: 90.5 fL (ref 80.0–100.0)
Platelets: 397 10*3/uL (ref 150–400)
RBC: 3.88 MIL/uL — ABNORMAL LOW (ref 4.22–5.81)
RDW: 13.7 % (ref 11.5–15.5)
WBC: 7.9 10*3/uL (ref 4.0–10.5)
nRBC: 0 % (ref 0.0–0.2)

## 2020-03-09 LAB — BASIC METABOLIC PANEL
Anion gap: 10 (ref 5–15)
BUN: 17 mg/dL (ref 6–20)
CO2: 27 mmol/L (ref 22–32)
Calcium: 8.4 mg/dL — ABNORMAL LOW (ref 8.9–10.3)
Chloride: 101 mmol/L (ref 98–111)
Creatinine, Ser: 1.28 mg/dL — ABNORMAL HIGH (ref 0.61–1.24)
GFR, Estimated: >60 mL/min (ref >60)
Glucose, Bld: 92 mg/dL (ref 70–99)
Potassium: 3.5 mmol/L (ref 3.5–5.1)
Sodium: 138 mmol/L (ref 135–145)

## 2020-03-09 LAB — MAGNESIUM: Magnesium: 2.1 mg/dL (ref 1.7–2.4)

## 2020-03-09 LAB — DIGOXIN LEVEL: Digoxin Level: 0.3 ng/mL — ABNORMAL LOW (ref 0.8–2.0)

## 2020-03-09 LAB — PHOSPHORUS: Phosphorus: 4.6 mg/dL (ref 2.5–4.6)

## 2020-03-09 MED ORDER — SPIRONOLACTONE 25 MG PO TABS: 25.0000 mg | ORAL_TABLET | Freq: Every day | ORAL | 1 refills | Status: DC

## 2020-03-09 MED ORDER — DAPAGLIFLOZIN PROPANEDIOL 10 MG PO TABS: 10.0000 mg | ORAL_TABLET | Freq: Every day | ORAL | 2 refills | Status: DC

## 2020-03-09 MED ORDER — DIGOXIN 125 MCG PO TABS: 0.1250 mg | ORAL_TABLET | Freq: Every day | ORAL | 2 refills | Status: DC

## 2020-03-09 MED ORDER — CARVEDILOL 3.125 MG PO TABS: 3.1250 mg | ORAL_TABLET | Freq: Two times a day (BID) | ORAL | 2 refills | Status: DC

## 2020-03-09 MED ORDER — TORSEMIDE 40 MG PO TABS: 80.0000 mg | ORAL_TABLET | Freq: Every day | ORAL | 2 refills | Status: DC

## 2020-03-09 MED ORDER — APIXABAN 5 MG PO TABS: ORAL_TABLET | ORAL | 0 refills | Status: DC

## 2020-03-09 MED FILL — FARXIGA 10 MG TABLET: 10 | 30 days supply | Qty: 30 | Fill #0

## 2020-03-09 MED FILL — DIGOXIN 0.125 MG TABLET: 125 | 30 days supply | Qty: 30 | Fill #0

## 2020-03-09 MED FILL — ELIQUIS 5 MG TABLET: 5 | 30 days supply | Qty: 74 | Fill #0

## 2020-03-09 MED FILL — TORSEMIDE 20 MG TABLET: 20 | 30 days supply | Qty: 120 | Fill #0

## 2020-03-09 MED FILL — CARVEDILOL 3.125 MG TABLET: 3.125 | 30 days supply | Qty: 60 | Fill #0

## 2020-03-09 MED FILL — SPIRONOLACTONE 25 MG TABLET: 25 | 30 days supply | Qty: 30 | Fill #0

## 2020-03-09 NOTE — Progress Notes (Signed)
[id number] Checked with pt to see if any questions re ed done yesterday. Pt stated he understands. Will let pt save energy to get home and not walk at this time. He is waiting on wife who is on her way. Luetta Nutting RN BSN 03/09/2020 11:04 AM

## 2020-03-09 NOTE — TOC Transition Note (Signed)
Transition of Care (TOC) - CM/SW Discharge Note Donn Pierini RN,BSN Transitions of Care Unit 4NP (non trauma) - RN Case Manager See Treatment Team for direct Phone #   Patient Details  Name: Drew [name] MRN: [id number] Date of Birth: 1960/04/07  Transition of Care [facility]) CM/SW Contact:  Darrold Span, RN Phone Number: 03/09/2020, 11:45 AM   Clinical Narrative:    Referral for medication benefits check- Eliquis copay 74.96, Marcelline Deist- will need prior auth.  TOC pharmacy to provide meds to bedside prior to pt leaving for home- including 30 day free Eliquis.  CM spoke with pt at bedside- provided copay assist cards for both Eliquis and Farxiga for ongoing needs.  Pt voiced appreciation for info and assistance.   Final next level of care: Home/Self Care Barriers to Discharge: No Barriers Identified   Patient Goals and CMS Choice Patient states their goals for this hospitalization and ongoing recovery are:: return home   Choice offered to / list presented to : NA  Discharge Placement               Home        Discharge Plan and Services In-house Referral: NA Discharge Planning Services: CM Consult,Medication Assistance Post Acute Care Choice: NA          DME Arranged: N/A DME Agency: NA       HH Arranged: NA HH Agency: NA        Social Determinants of Health (SDOH) Interventions     Readmission Risk Interventions Readmission Risk Prevention Plan 03/09/2020  Transportation Screening Complete  PCP or Specialist Appt within 5-7 Days Complete  Home Care Screening Complete  Medication Review (RN CM) Complete  Some recent data might be hidden

## 2020-03-09 NOTE — Discharge Summary (Signed)
Physician Discharge Summary  Drew [name] WNU:[id number] DOB: [birth year] DOA: 03/03/2020  PCP: Esperanza Richters, PA-C  Admit date: 03/03/2020 Discharge date: 03/09/2020  Admitted From: Home  Discharge disposition: Home   Recommendations for Outpatient Follow-Up:   . Follow up with your primary care provider in one week.  . Check CBC, BMP, magnesium in the next visit . Follow up with cardiology clinic on 03/21/20, as has been scheduled. . Would benefit from TSH monitoring in 4 to 6 weeks.  Discharge Diagnosis:   Principal Problem:   Pulmonary embolism (HCC) Active Problems:   Essential hypertension   Hyperlipidemia   Tobacco abuse   Obesity, Class III, BMI 40-49.9 (morbid obesity) (HCC)   Nonischemic cardiomyopathy (HCC)   Dilated cardiomyopathy (HCC)   HTN (hypertension)   Chronic systolic CHF (congestive heart failure) (HCC)   Hypokalemia   AKI (acute kidney injury) (HCC)   Typical atrial flutter (HCC)   Acute on chronic combined systolic and diastolic CHF (congestive heart failure) (HCC)    Discharge Condition: Improved.  Diet recommendation: Low sodium, heart healthy.  Fluid restriction 155ml/day  Wound care: None.  Code status: Full.   History of Present Illness:   Patient is a 60 years old male with past medical history of nonischemic cardiomyopathy with last known ejection fraction of 20 to 25% on 03/2019,  heart failure with reduced ejection fraction, tobacco use, hyperlipidemia, hypertension, pulmonary hypertension, morbid obesity presented to the hospital with worsening shortness of breath especially on minimal exertion.  He was noted to be hypoxic at the Cardiology clinic and had some pleuritic chest pain as well. CTA was performed in the emergency department which showed findings positive for pulmonary embolism with infarcts and very small pleural effusion.  Patient was then admitted to the hospital for further evaluation and treatment.  Patient was seen by  cardiology for atrial flutter with low ejection fraction.  Patient underwent TEE and DC cardioversion on 03/07/2020   Hospital Course:   Following conditions were addressed during hospitalization as listed below,  Bilateral pulmonary embolism with pulmonary infarcts.  On  Eliquis p.o. this will be continued indefinitely. 2D echo  showed left ventricular ejection fraction of 20% with moderately reduced RV function, patient has been recommended indefinite anticoagulation.  Venous duplex ultrasound of the lower extremities showed left peroneal DVT.  Patient had MRI of the heart which showed LV ejection fraction of 40%.  Spoke with Cardiology prior to disposition.  Paroxysmal A flutter; patient was initially on amiodarone drip.  Patient underwent TEE and cardioversion on 03/07/2020.   Patient will be continued on Coreg and Eliquis, Dose of the Coreg has been decreased on discharge.  Digoxin will be continued as per cardiology.  Will need to follow-up with cardiology as outpatient on the schedule it.  Acute on chronic systolic heart failure exacerbation. Congestive heart failure team  followed the patient during hospitalization. On Entresto spironolactone, Lasix, Coreg, digoxin. on dapagliflozin.  Coreg has been decreased for  Acute hypoxic respiratory failure secondary to pulmonary embolism. Resolved at this time.  Patient did not require any supplemental oxygen prior to discharge.  Hypokalemia; improved with replacement.    Elevated TSH;   Free T3-T4 normal.  Will need outpatient monitoring of TSH in 4-6 weeks.   Class III obesity; patient would benefit from weight loss on a ongoing basis.  Acute kidney injury. Creatinine has improved.  Latest creatinine of 1.2  Acute gout flare on the left foot;   received colchicine, got 1  dose of prednisone..  On allopurinol chronically.  Disposition.  At this time, patient is stable for disposition home with outpatient PCP and cardiology  follow-up.  Medical Consultants:    Cardiology  Pulmonary  Procedures:     2D ECHO  Doppler ultrasound of the lower extremity  TEE and DC cardioversion on 03/07/2020  Subjective:   Today, patient was seen and examined.  Patient denied any chest pain, increasing shortness of breath, fever, chills.  Nursing staff reported bradycardia.  Discharge Exam:   Vitals:   03/09/20 0400 03/09/20 0700  BP: 124/76 118/83  Pulse: (!) 58 79  Resp: 17 15  Temp:  98.3 F (36.8 C)  SpO2: 98% 93%   Vitals:   03/08/20 1808 03/08/20 2006 03/09/20 0400 03/09/20 0700  BP:  108/63 124/76 118/83  Pulse: 80 69 (!) 58 79  Resp:  14 17 15   Temp:  97.9 F (36.6 C)  98.3 F (36.8 C)  TempSrc:  Oral  Oral  SpO2:  95% 98% 93%  Weight:      Height:       General: Obese built, not in obvious distress HENT:   No scleral pallor or icterus noted. Oral mucosa is moist.  Chest:  Clear breath sounds.  Diminished breath sounds bilaterally. No crackles or wheezes.  CVS: S1 &S2 heard. No murmur.  Regular rate and rhythm.  Bradycardia noted. Abdomen: Soft, nontender, nondistended.  Bowel sounds are heard.   Extremities: No cyanosis, clubbing or edema.  Peripheral pulses are palpable. Psych: Alert, awake and oriented, normal mood CNS:  No cranial nerve deficits.  Power equal in all extremities.   Skin: Warm and dry.  No rashes noted.   The results of significant diagnostics from this hospitalization (including imaging, microbiology, ancillary and laboratory) are listed below for reference.     Diagnostic Studies:   CT Angio Chest PE W and/or Wo Contrast  Result Date: 03/03/2020 CLINICAL DATA:  Shortness of breath for 4 days. EXAM: CT ANGIOGRAPHY CHEST WITH CONTRAST TECHNIQUE: Multidetector CT imaging of the chest was performed using the standard protocol during bolus administration of intravenous contrast. Multiplanar CT image reconstructions and MIPs were obtained to evaluate the vascular  anatomy. CONTRAST:  100 mL OMNIPAQUE IOHEXOL 350 MG/ML SOLN COMPARISON:  Single-view of the chest today. FINDINGS: Cardiovascular: Pulmonary emboli are seen in interlobar pulmonary artery branches bilaterally. There is no evidence of right heart strain. Mild cardiomegaly. No pericardial effusion. Mediastinum/Nodes: Small bilateral mediastinal and hilar lymph nodes are identified. No pathologic lymphadenopathy by CT size criteria. 0.9 cm low attenuating lesion in the right lobe of the thyroid is noted. Not clinically significant; no follow-up imaging recommended (ref: J Am Coll Radiol. 2015 Feb;12(2): 143-50). Lungs/Pleura: Small bilateral pleural effusions. Wedge-shaped opacities are seen in the right lower and right middle lobes worrisome for pulmonary infarcts. There is also some dependent atelectasis. Upper Abdomen: There is fatty infiltration of the liver. Left renal cyst is partially imaged. Musculoskeletal: No acute or focal abnormality. Review of the MIP images confirms the above findings. IMPRESSION: The examination is positive for pulmonary emboli. There is no evidence of right heart strain but wedge-shaped opacities in the right middle and lower lobes are worrisome for pulmonary infarcts. Very small bilateral pleural effusions. Cardiomegaly. Fatty infiltration of the liver. Critical Value/emergent results were called by telephone at the time of interpretation on 03/03/2020 at 1:27 pm to provider [facility] , who verbally acknowledged these results. Electronically Signed   By: Maisie Fus  Dalessio M.D.   On: 03/03/2020 13:31   DG Chest Port 1 View  Result Date: 03/03/2020 CLINICAL DATA:  Shortness of breath. EXAM: PORTABLE CHEST 1 VIEW COMPARISON:  PA and lateral chest 11/13/2019. FINDINGS: Lungs clear. Heart size upper normal. No pneumothorax or pleural fluid. No acute or focal bony abnormality. IMPRESSION: No acute disease. Electronically Signed   By: Drusilla Kannerhomas  Dalessio M.D.   On: 03/03/2020 11:03    VAS US LOWER EXTREMITY VENOUS (DVT)  Result Date: 03/05/2020  Lower Venous DVT Study Indications: Pulmonary embolism.  Risk Factors: Confirmed PE. Anticoagulation: Heparin. Limitations: Body habitus and poor ultrasound/tissue interface. Comparison Study: No prior studies. Performing Technologist: Chanda BusingGregory Collins RVT  Examination Guidelines: A complete evaluation includes B-mode imaging, spectral Doppler, color Doppler, and power Doppler as needed of all accessible portions of each vessel. Bilateral testing is considered an integral part of a complete examination. Limited examinations for reoccurring indications may be performed as noted. The reflux portion of the exam is performed with the patient in reverse Trendelenburg.  +---------+---------------+---------+-----------+----------+--------------+ RIGHT    CompressibilityPhasicitySpontaneityPropertiesThrombus Aging +---------+---------------+---------+-----------+----------+--------------+ CFV      Full           Yes      Yes                                 +---------+---------------+---------+-----------+----------+--------------+ SFJ      Full                                                        +---------+---------------+---------+-----------+----------+--------------+ FV Prox  Full                                                        +---------+---------------+---------+-----------+----------+--------------+ FV Mid   Full                                                        +---------+---------------+---------+-----------+----------+--------------+ FV DistalFull                                                        +---------+---------------+---------+-----------+----------+--------------+ PFV      Full                                                        +---------+---------------+---------+-----------+----------+--------------+ POP      Full           Yes      Yes                                  +---------+---------------+---------+-----------+----------+--------------+  PTV      Full                                                        +---------+---------------+---------+-----------+----------+--------------+ PERO     Full                                                        +---------+---------------+---------+-----------+----------+--------------+   +---------+---------------+---------+-----------+----------+--------------+ LEFT     CompressibilityPhasicitySpontaneityPropertiesThrombus Aging +---------+---------------+---------+-----------+----------+--------------+ CFV      Full           Yes      Yes                                 +---------+---------------+---------+-----------+----------+--------------+ SFJ      Full                                                        +---------+---------------+---------+-----------+----------+--------------+ FV Prox  Full                                                        +---------+---------------+---------+-----------+----------+--------------+ FV Mid   Full                                                        +---------+---------------+---------+-----------+----------+--------------+ FV DistalFull                                                        +---------+---------------+---------+-----------+----------+--------------+ PFV      Full                                                        +---------+---------------+---------+-----------+----------+--------------+ POP      Full           Yes      Yes                                 +---------+---------------+---------+-----------+----------+--------------+ PTV      Full                                                        +---------+---------------+---------+-----------+----------+--------------+  PERO     None                                         Acute           +---------+---------------+---------+-----------+----------+--------------+     Summary: RIGHT: - There is no evidence of deep vein thrombosis in the lower extremity.  - No cystic structure found in the popliteal fossa.  LEFT: - Findings consistent with acute deep vein thrombosis involving the left peroneal veins. - No cystic structure found in the popliteal fossa.  *See table(s) above for measurements and observations. Electronically signed by Heath Lark on 03/05/2020 at 10:56:31 AM.    Final    ECHOCARDIOGRAM LIMITED  Result Date: 03/04/2020    ECHOCARDIOGRAM LIMITED REPORT   Patient Name:   Drew [name] Date of Exam: 03/04/2020 Medical Rec #:  [id number]      Height:       70.0 in Accession #:    [id number]     Weight:       358.0 lb Date of Birth:  1960-10-31       BSA:          2.673 m Patient Age:    59 years       BP:           127/95 mmHg Patient Gender: M              HR:           113 bpm. Exam Location:  Inpatient Procedure: Limited Echo, Cardiac Doppler, Color Doppler and Intracardiac            Opacification Agent Indications:    I26.02 Pulmonary embolus  History:        Patient has prior history of Echocardiogram examinations, most                 recent 02/29/2020. CHF; Risk Factors:Hypertension, Diabetes,                 Dyslipidemia and Current Smoker. ETOH.  Sonographer:    Sheralyn Boatman RDCS Referring Phys: [id number] ANASTASSIA DOUTOVA  Sonographer Comments: Technically difficult study due to poor echo windows and patient is morbidly obese. Image acquisition challenging due to patient body habitus. IMPRESSIONS  1. Left ventricular ejection fraction, by estimation, is <20%. The left ventricle has severely decreased function. The left ventricle has no regional wall motion abnormalities. The left ventricular internal cavity size was moderately dilated. There is moderate left ventricular hypertrophy.  2. Right ventricular systolic function is moderately reduced. The right ventricular size is moderately  enlarged. There is normal pulmonary artery systolic pressure.  3. Left atrial size was moderately dilated.  4. Right atrial size was moderately dilated.  5. The mitral valve is normal in structure. No evidence of mitral valve regurgitation. No evidence of mitral stenosis.  6. The aortic valve is normal in structure. Aortic valve regurgitation is not visualized. No aortic stenosis is present.  7. The inferior vena cava is dilated in size with <50% respiratory variability, suggesting right atrial pressure of 15 mmHg. Comparison(s): Prior images reviewed side by side. Changes from prior study are noted. FINDINGS  Left Ventricle: Left ventricular ejection fraction, by estimation, is <20%. The left ventricle has severely decreased function. The left ventricle has no regional wall motion abnormalities. Definity contrast agent was given IV to delineate the left  ventricular endocardial borders. The left ventricular internal cavity size was moderately dilated. There is moderate left ventricular hypertrophy. Right Ventricle: The right ventricular size is moderately enlarged. No increase in right ventricular wall thickness. Right ventricular systolic function is moderately reduced. There is normal pulmonary artery systolic pressure. The tricuspid regurgitant velocity is 1.93 m/s, and with an assumed right atrial pressure of 3 mmHg, the estimated right ventricular systolic pressure is 17.9 mmHg. Left Atrium: Left atrial size was moderately dilated. Right Atrium: Right atrial size was moderately dilated. Pericardium: There is no evidence of pericardial effusion. Mitral Valve: The mitral valve is normal in structure. No evidence of mitral valve stenosis. Tricuspid Valve: The tricuspid valve is normal in structure. Tricuspid valve regurgitation is mild . No evidence of tricuspid stenosis. Aortic Valve: The aortic valve is normal in structure. Aortic valve regurgitation is not visualized. No aortic stenosis is present. Pulmonic  Valve: The pulmonic valve was normal in structure. Pulmonic valve regurgitation is not visualized. No evidence of pulmonic stenosis. Aorta: The aortic root is normal in size and structure. Venous: The inferior vena cava is dilated in size with less than 50% respiratory variability, suggesting right atrial pressure of 15 mmHg. IAS/Shunts: No atrial level shunt detected by color flow Doppler. LEFT VENTRICLE PLAX 2D LVIDd:         6.60 cm LVIDs:         6.00 cm LV PW:         1.70 cm LV IVS:        1.50 cm LVOT diam:     2.30 cm LVOT Area:     4.15 cm  LV Volumes (MOD) LV vol d, MOD A4C: 136.0 ml LV vol s, MOD A4C: 107.0 ml LV SV MOD A4C:     136.0 ml RIGHT VENTRICLE         IVC TAPSE (M-mode): 1.9 cm  IVC diam: 2.60 cm LEFT ATRIUM         Index      RIGHT ATRIUM           Index LA diam:    4.30 cm 1.61 cm/m RA Area:     24.40 cm                                RA Volume:   82.40 ml  30.82 ml/m   AORTA Ao Root diam: 3.80 cm TRICUSPID VALVE TR Peak grad:   14.9 mmHg TR Vmax:        193.00 cm/s  SHUNTS Systemic Diam: 2.30 cm Donato Schultz MD Electronically signed by Donato Schultz MD Signature Date/Time: 03/04/2020/2:14:10 PM    Final      Labs:   Basic Metabolic Panel: Recent Labs  Lab 03/04/20 0000 03/05/20 0333 03/07/20 1209 03/08/20 0242 03/09/20 0345  NA 136 138 138 138 138  K 3.1* 3.7 3.9 3.7 3.5  CL 99 103 105 103 101  CO2 26 22 23 26 27   GLUCOSE 108* 109* 114* 97 92  BUN 19 14 16 14 17   CREATININE 1.24 1.21 1.22 1.18 1.28*  CALCIUM 8.9 8.6* 8.9 8.8* 8.4*  MG 2.5*  --   --   --  2.1  PHOS 3.9  --   --   --  4.6   GFR Estimated Creatinine Clearance: 99.1 mL/min (A) (by C-G formula based on SCr of 1.28 mg/dL (H)). Liver Function Tests: Recent Labs  Lab 03/04/20  0000  AST 32  ALT 28  ALKPHOS 61  BILITOT 1.2  PROT 7.5  ALBUMIN 3.5   No results for input(s): LIPASE, AMYLASE in the last 168 hours. No results for input(s): AMMONIA in the last 168 hours. Coagulation profile Recent  Labs  Lab 03/03/20 1032 03/06/20 1448  INR 1.1 1.2    CBC: Recent Labs  Lab 03/04/20 0000 03/05/20 0333 03/06/20 0353 03/07/20 0407 03/08/20 0242 03/09/20 0345  WBC 7.8 6.5 8.1 8.5 8.8 7.9  NEUTROABS 4.7  --   --   --   --   --   HGB 12.5* 11.1* 11.3* 12.5* 11.4* 11.8*  HCT 38.6* 33.0* 34.6* 37.0* 33.8* 35.1*  MCV 91.7 91.9 92.0 90.7 90.6 90.5  PLT 328 319 383 352 398 397   Cardiac Enzymes: No results for input(s): CKTOTAL, CKMB, CKMBINDEX, TROPONINI in the last 168 hours. BNP: Invalid input(s): POCBNP CBG: No results for input(s): GLUCAP in the last 168 hours. D-Dimer No results for input(s): DDIMER in the last 72 hours. Hgb A1c No results for input(s): HGBA1C in the last 72 hours. Lipid Profile No results for input(s): CHOL, HDL, LDLCALC, TRIG, CHOLHDL, LDLDIRECT in the last 72 hours. Thyroid function studies No results for input(s): TSH, T4TOTAL, T3FREE, THYROIDAB in the last 72 hours.  Invalid input(s): FREET3 Anemia work up No results for input(s): VITAMINB12, FOLATE, FERRITIN, TIBC, IRON, RETICCTPCT in the last 72 hours. Microbiology Recent Results (from the past 240 hour(s))  Resp Panel by RT-PCR (Flu A&B, Covid) Nasopharyngeal Swab     Status: None   Collection Time: 03/03/20 12:37 PM   Specimen: Nasopharyngeal Swab; Nasopharyngeal(NP) swabs in vial transport medium  Result Value Ref Range Status   SARS Coronavirus 2 by RT PCR NEGATIVE NEGATIVE Final    Comment: (NOTE) SARS-CoV-2 target nucleic acids are NOT DETECTED.  The SARS-CoV-2 RNA is generally detectable in upper respiratory specimens during the acute phase of infection. The lowest concentration of SARS-CoV-2 viral copies this assay can detect is 138 copies/mL. A negative result does not preclude SARS-Cov-2 infection and should not be used as the sole basis for treatment or other patient management decisions. A negative result may occur with  improper specimen collection/handling, submission of  specimen other than nasopharyngeal swab, presence of viral mutation(s) within the areas targeted by this assay, and inadequate number of viral copies(<138 copies/mL). A negative result must be combined with clinical observations, patient history, and epidemiological information. The expected result is Negative.  Fact Sheet for Patients:  BloggerCourse.com  Fact Sheet for Healthcare Providers:  SeriousBroker.it  This test is no t yet approved or cleared by the [place] FDA and  has been authorized for detection and/or diagnosis of SARS-CoV-2 by FDA under an Emergency Use Authorization (EUA). This EUA will remain  in effect (meaning this test can be used) for the duration of the COVID-19 declaration under Section 564(b)(1) of the Act, 21 U.S.C.section 360bbb-3(b)(1), unless the authorization is terminated  or revoked sooner.       Influenza A by PCR NEGATIVE NEGATIVE Final   Influenza B by PCR NEGATIVE NEGATIVE Final    Comment: (NOTE) The Xpert Xpress SARS-CoV-2/FLU/RSV plus assay is intended as an aid in the diagnosis of influenza from Nasopharyngeal swab specimens and should not be used as a sole basis for treatment. Nasal washings and aspirates are unacceptable for Xpert Xpress SARS-CoV-2/FLU/RSV testing.  Fact Sheet for Patients: BloggerCourse.com  Fact Sheet for Healthcare Providers: SeriousBroker.it  This test is not  yet approved or cleared by the [place] and has been authorized for detection and/or diagnosis of SARS-CoV-2 by FDA under an Emergency Use Authorization (EUA). This EUA will remain in effect (meaning this test can be used) for the duration of the COVID-19 declaration under Section 564(b)(1) of the Act, 21 U.S.C. section 360bbb-3(b)(1), unless the authorization is terminated or revoked.  Performed at [facility], [address] [place], [place] [address]      Discharge Instructions:   Discharge Instructions    Avoid straining   Complete by: As directed    Diet - low sodium heart healthy   Complete by: As directed    Fluid restriction 1585ml/day   Discharge instructions   Complete by: As directed    Follow-up with your primary care physician in 1 week.  Check blood work at that time.  Continue to take medications as prescribed including blood thinners.  Follow-up with cardiology clinic as scheduled by the clinic. No overexertion.  Please seek medical attention for worsening symptoms.   Heart Failure patients record your daily weight using the same scale at the same time of day   Complete by: As directed    Increase activity slowly   Complete by: As directed    STOP any activity that causes chest pain, shortness of breath, dizziness, sweating, or exessive weakness   Complete by: As directed      Allergies as of 03/09/2020      Reactions   Tetracyclines & Related Hives, Itching      Medication List    TAKE these medications   albuterol 108 (90 Base) MCG/ACT inhaler Commonly known as: VENTOLIN HFA Inhale 2 puffs into the lungs every 6 (six) hours as needed for wheezing or shortness of breath.   allopurinol 100 MG tablet Commonly known as: ZYLOPRIM TAKE 1 TABLET(100 MG) BY MOUTH DAILY What changed: See the new instructions.   apixaban 5 MG Tabs tablet Commonly known as: ELIQUIS Take 2 tablets (10 mg total) by mouth 2 (two) times daily for 4 days, THEN 1 tablet (5 mg total) 2 (two) times daily. Start taking on: March 09, 2020   carvedilol 3.125 MG tablet Commonly known as: COREG Take 1 tablet (3.125 mg total) by mouth 2 (two) times daily with a meal. What changed:   medication strength  how much to take   dapagliflozin propanediol 10 MG Tabs tablet Commonly known as: FARXIGA Take 1 tablet (10 mg total) by mouth daily.   digoxin 0.125 MG tablet Commonly known as: LANOXIN Take 1 tablet  (0.125 mg total) by mouth daily.   Entresto 24-26 MG Generic drug: sacubitril-valsartan TAKE 1 TABLET BY MOUTH TWICE DAILY   spironolactone 25 MG tablet Commonly known as: ALDACTONE Take 1 tablet (25 mg total) by mouth daily. What changed: how much to take   Torsemide 40 MG Tabs Take 80 mg by mouth daily. What changed:   medication strength  how much to take  how to take this  when to take this  additional instructions       Follow-up Information    [facility] Follow up on 03/21/2020.   Specialty: Cardiology Why: 9:30 AM  [facility], [address]  Contact information: [address] [id number] [place] [address] [phone]       [place], [place], [place]. Schedule an appointment as soon as  possible for a visit.   Specialties: Internal Medicine, Family Medicine Why: regular check up Contact information: [address] [place] [address] [phone]        Thomasene Ripple, DO .   Specialty: Cardiology Contact information: [address] [place] [address] [phone]                Time coordinating discharge: 39 minutes  Signed:  Laxman Pokhrel  [facility] 03/09/2020, 8:25 AM

## 2020-03-09 NOTE — Progress Notes (Addendum)
Patient ID: Drew [name], male   DOB: [birth year], 60 y.o.   MRN: [id number]     Advanced Heart Failure Rounding Note  PCP-Cardiologist: Kardie Tobb, DO   Subjective:    S/p successful TEE/ DCCV   CMRI LV/RV EF 42% with no LGE.     Yesterday coreg cut back to 3.125 mg twice a day.   Feels ok. Denies shortness of breath.    Objective:   Weight Range: (!) 165.4 kg Body mass index is 52.32 kg/m.   Vital Signs:   Temp:  [97.9 F (36.6 C)-99 F (37.2 C)] 98.3 F (36.8 C) (02/23 0700) Pulse Rate:  [58-81] 79 (02/23 0700) Resp:  [14-20] 15 (02/23 0700) BP: (108-132)/(63-83) 118/83 (02/23 0700) SpO2:  [93 %-98 %] 93 % (02/23 0700) Last BM Date: 03/07/20  Weight change: Filed Weights   03/06/20 0500 03/07/20 0500 03/08/20 0500  Weight: (!) 165 kg (!) 164.8 kg (!) 165.4 kg    Intake/Output:   Intake/Output Summary (Last 24 hours) at 03/09/2020 0903 Last data filed at 03/09/2020 0743 Gross per 24 hour  Intake 240 ml  Output 3050 ml  Net -2810 ml      Physical Exam   General:   No resp difficulty HEENT: normal Neck: supple. no JVD. Carotids 2+ bilat; no bruits. No lymphadenopathy or thryomegaly appreciated. Cor: PMI nondisplaced. Regular rate & rhythm. No rubs, gallops or murmurs. Lungs: clear Abdomen: soft, nontender, nondistended. No hepatosplenomegaly. No bruits or masses. Good bowel sounds. Extremities: no cyanosis, clubbing, rash, edema Neuro: alert & orientedx3, cranial nerves grossly intact. moves all 4 extremities w/o difficulty. Affect pleasant     Telemetry   SR 60-80s personally reviewed.   Labs    CBC Recent Labs    03/08/20 0242 03/09/20 0345  WBC 8.8 7.9  HGB 11.4* 11.8*  HCT 33.8* 35.1*  MCV 90.6 90.5  PLT 398 397   Basic Metabolic Panel Recent Labs    70/35/00 0242 03/09/20 0345  NA 138 138  K 3.7 3.5  CL 103 101  CO2 26 27  GLUCOSE 97 92  BUN 14 17  CREATININE 1.18 1.28*  CALCIUM 8.8* 8.4*  MG  --  2.1  PHOS  --  4.6    Liver Function Tests No results for input(s): AST, ALT, ALKPHOS, BILITOT, PROT, ALBUMIN in the last 72 hours. No results for input(s): LIPASE, AMYLASE in the last 72 hours. Cardiac Enzymes No results for input(s): CKTOTAL, CKMB, CKMBINDEX, TROPONINI in the last 72 hours.  BNP: BNP (last 3 results) Recent Labs    11/13/19 1456 03/04/20 0000  BNP 8 27.0    ProBNP (last 3 results) Recent Labs    03/16/19 1026 06/30/19 1039  PROBNP 39.0 45.0     D-Dimer No results for input(s): DDIMER in the last 72 hours. Hemoglobin A1C No results for input(s): HGBA1C in the last 72 hours. Fasting Lipid Panel No results for input(s): CHOL, HDL, LDLCALC, TRIG, CHOLHDL, LDLDIRECT in the last 72 hours. Thyroid Function Tests No results for input(s): TSH, T4TOTAL, T3FREE, THYROIDAB in the last 72 hours.  Invalid input(s): FREET3  Other results:   Imaging    MR CARDIAC MORPHOLOGY W WO CONTRAST  Result Date: 03/08/2020 CLINICAL DATA:  Nonischemic cardiomyopathy EXAM: CARDIAC MRI TECHNIQUE: The patient was scanned on a 1.5 Tesla GE magnet. A dedicated cardiac coil was used. Functional imaging was done using Fiesta sequences. 2,3, and 4 chamber views were done to assess for RWMA's. Modified Simpson's rule  using a short axis stack was used to calculate an ejection fraction on a dedicated work [employer]. The patient received 10 cc of Gadavist. After 10 minutes inversion recovery sequences were used to assess for infiltration and scar tissue. FINDINGS: Limited images of the lung fields showed atelectasis at the left base. Trivial pleural effusions. Moderately dilated left ventricle with normal wall thickness. Diffuse hypokinesis with EF 42%. Moderately dilated right ventricle with EF 42%, mildly decreased systolic function. Mildly dilated left atrium, normal right atrial size. Trileaflet aortic valve with no significant stenosis or regurgitation. Trivial mitral regurgitation. On  delayed enhancement imaging, there was subtle mid-wall late gadolinium enhancement (LGE) in the mid inferoseptal wall the the RV insertion site. Measurements: LVEDV 301 mL LVSV 126 mL LVEF 42% RVEDV 324 mL RVSV 136 mL RVEF 42% IMPRESSION: 1.  Moderately dilated left ventricle with EF 42%. 2.  Moderately dilated right ventricle with EF also 42%. 3. Subtle mid inferoseptal wall LGE at the RV insertion site. This is nonspecific and suggestive of elevated filling pressures. Dalton Mclean Electronically Signed   By: [name] M.D.   On: 03/08/2020 17:31     Medications:     Scheduled Medications: . allopurinol  100 mg Oral Daily  . apixaban  10 mg Oral BID   Followed by  . [START ON 03/13/2020] apixaban  5 mg Oral BID  . carvedilol  3.125 mg Oral BID WC  . colchicine  0.6 mg Oral Daily  . dapagliflozin propanediol  10 mg Oral Daily  . digoxin  0.125 mg Oral Daily  . polyethylene glycol  17 g Oral Daily  . sacubitril-valsartan  1 tablet Oral BID  . sodium chloride flush  3 mL Intravenous Q12H  . spironolactone  25 mg Oral Daily  . torsemide  80 mg Oral Daily    Infusions: . sodium chloride      PRN Medications: sodium chloride, acetaminophen **OR** acetaminophen, albuterol, HYDROcodone-acetaminophen, sodium chloride flush    Assessment/Plan   1. Right-sided PE possibly with infarction and LLE DVT: Unprovoked.  Patient has no FH of VTE.  He has not been sedentary, has been working full time at the [employer].  Echo in 2/14 with moderate RV dilation/moderate dysfunction on my read, echo today (2/18) with similar appearance of the RV.  The RV looks worse by echo now than it did on previous echo in 3/21.  I think based on symptoms that he had the PE on 2/13.  Symptoms are minimal at rest currently.  HS-TnI and BNP surprisingly normal at admission.  - Initially placed on IV heparin, now on Eliquis (PE treatment dosing) - He does have RV strain first noted on 2/14 echo, but I think that  the PE occurred a number of days prior to admission, and suspect that thrombolysis is not warranted at this point.  2. Atrial flutter: Typical flutter.  This was noted first at time of echo on 2/14.  Suspect this was triggered by PE.   - S/p successful TEE/DCCV 2/21 - Maintaining SR - off amio w/ bradycardia. Continue lower dose of coreg.  - Continue anticoagulation therapy w/ Eliquis   - Will continue digoxin for now given concomitant biventricular dysfunction. Dig level 0.3 - if recurrence, consider referral to EP for AFL ablation  - recommend outpatient sleep study to r/o OSA . We will set up next week.  3. Acute on chronic systolic CHF: Echo from 2/18 was reviewed, shows EF around 20%  with moderate RV dysfunction and dilated RV.  Cath from 5/20 with no significant CAD.  Cause of CMP is uncertain, EF has been low since 2020.  NICM.  ?Viral myocarditis.  Volume status improved with diuresis. Now off IV Lasix. - Volume status ok. Start back torsemide 80 mg daily. (home dose torsemide 80 mg/20mg ) - Continue Coreg 3.125 mg bid given bradycardia and digoxin.  - Continue spironolactone 25 mg daily.  - Continue digoxin 0.125 to help RV function. Dig level ok.  - Continue Entresto 24/26 bid. - CMRI LVEF 42% RVEF 42%.  - Continue dapagliflozin 10 mg daily.  4. Gout: Flare in left foot, now improved.  - He is on allopurinol chronically.  - Added colchicine and and a burst of prednisone.    HF follow up has been set up.  - Torsemide 80  mg daily -Coreg 3.125 mg twice a day.  - Digoxin 0.125 mg daily - Spiro 25 mg daily  -Entresto 24-26 mg twice a day  - Dapagliflozin 10 mg daily  - On eliquis for PE A flutter   Length of Stay: 6  Amy Clegg, NP  03/09/2020, 9:03 AM  Advanced Heart Failure Team Pager [phone] (M-F; 7a - 4p)  Please contact [facility] for night-coverage after hours (4p -7a ) and weekends on [email or web address]  Patient seen with NP, agree with the above note.   He remains in  [facility] after DCCV.    Cardiac MRI yesterday with LV EF and RV EF both 42%. No LGE.    No complaints today.    I think that he can go home on the above meds, will arrange followup with [place] in HF clinic.  [name] 03/09/2020

## 2020-03-09 NOTE — Telephone Encounter (Signed)
Advanced Heart Failure Patient Advocate Encounter  Prior Authorization for Marcelline Deist has been submitted and approved.    PA# [id number] Effective dates: 03/09/20 through 03/09/21  Patients co-pay is $15  Archer Asa, CPhT

## 2020-03-16 ENCOUNTER — Other Ambulatory Visit: Payer: Self-pay

## 2020-03-16 ENCOUNTER — Ambulatory Visit: Payer: Federal, State, Local not specified - PPO | Admitting: Medical

## 2020-03-16 VITALS — BP 121/62 | HR 63 | Resp 18 | Ht 70.0 in | Wt 360.2 lb

## 2020-03-16 DIAGNOSIS — I2699 Other pulmonary embolism without acute cor pulmonale: Secondary | ICD-10-CM | POA: Diagnosis not present

## 2020-03-16 DIAGNOSIS — I82452 Acute embolism and thrombosis of left peroneal vein: Secondary | ICD-10-CM

## 2020-03-16 DIAGNOSIS — I509 Heart failure, unspecified: Secondary | ICD-10-CM

## 2020-03-16 DIAGNOSIS — I517 Cardiomegaly: Secondary | ICD-10-CM

## 2020-03-16 DIAGNOSIS — G8929 Other chronic pain: Secondary | ICD-10-CM

## 2020-03-16 DIAGNOSIS — I10 Essential (primary) hypertension: Secondary | ICD-10-CM

## 2020-03-16 DIAGNOSIS — R7989 Other specified abnormal findings of blood chemistry: Secondary | ICD-10-CM | POA: Diagnosis not present

## 2020-03-16 DIAGNOSIS — M25562 Pain in left knee: Secondary | ICD-10-CM

## 2020-03-16 LAB — COMPREHENSIVE METABOLIC PANEL
ALT: 21 U/L (ref 0–53)
AST: 13 U/L (ref 0–37)
Albumin: 4.2 g/dL (ref 3.5–5.2)
Alkaline Phosphatase: 60 U/L (ref 39–117)
BUN: 22 mg/dL (ref 6–23)
CO2: 30 mEq/L (ref 19–32)
Calcium: 9.8 mg/dL (ref 8.4–10.5)
Chloride: 102 mEq/L (ref 96–112)
Creatinine, Ser: 1.14 mg/dL (ref 0.40–1.50)
GFR: 71.4 mL/min (ref >60.00)
Glucose, Bld: 81 mg/dL (ref 70–99)
Potassium: 4.4 mEq/L (ref 3.5–5.1)
Sodium: 137 mEq/L (ref 135–145)
Total Bilirubin: 0.4 mg/dL (ref 0.2–1.2)
Total Protein: 7.4 g/dL (ref 6.0–8.3)

## 2020-03-16 LAB — MAGNESIUM: Magnesium: 2.4 mg/dL (ref 1.5–2.5)

## 2020-03-16 LAB — BRAIN NATRIURETIC PEPTIDE: Pro B Natriuretic peptide (BNP): 11 pg/mL (ref 0.0–100.0)

## 2020-03-16 NOTE — Progress Notes (Signed)
   Subjective:    Patient ID: Drew [name], male    DOB: [birth year], 60 y.o.   MRN: [id number]  HPI  Pt in for follow up on hospitalization.  Pt had recent PE bilaterally.   "Patient is a 60 years old male with past medical history of nonischemic cardiomyopathy with last known ejection fraction of 20 to 25% on 03/2019, heart failure with reduced ejection fraction, tobacco use, hyperlipidemia, hypertension, pulmonary hypertension, morbid obesity presented to the hospital with worsening shortness of breath especially on minimal exertion. He was noted to be hypoxic at the Cardiology clinic and had some pleuritic chest pain as well. CTAwas performed in the emergency department which showed findings positive for pulmonary embolism with infarcts and very small pleural effusion. Patient was then admitted to the hospital for further evaluation and treatment. Patient was seen by cardiology for atrial flutter with low ejection fraction.Patient underwent TEE and DC cardioversion on 03/07/2020"  Found to have left lower ext DVT. Peroneal vein.  Pt was very immobile at home in his recliner. Very stationary at home. No trauma, no recent smoking since 2020 and no lon travel.   Pt is seeing pulmonologist on 03-21-2020. Pt will see cardiologist on 04-06-2020.  Hx of left knee pain for year. Mild djd changes left knee. Pain still persists.  For chf and htn. On Carvedilol 3.125 mg bid. Digoxine 0.125 mg daily. Entresto 24-26 one bid. Sprinolactone 25 mg daily. Also on torsemide 80 mg daily.  On eliquis 5 mg twice daily per ED. Pt given prescription and cards.    Review of Systems  Constitutional: Negative for chills, fatigue and fever.  Respiratory: Negative for cough, chest tightness, shortness of breath and wheezing.   Cardiovascular: Negative for chest pain and palpitations.  Gastrointestinal: Negative for abdominal pain and diarrhea.  Genitourinary: Negative for dysuria.  Musculoskeletal:        See hpi.  Skin: Negative for rash.  Neurological: Negative for dizziness, numbness and headaches.  Hematological: Negative for adenopathy. Does not bruise/bleed easily.  Psychiatric/Behavioral: Negative for behavioral problems and confusion.       Objective:   Physical Exam  General Mental Status- Alert. General Appearance- Not in acute distress.   Skin General: Color- Normal Color. Moisture- Normal Moisture.  Neck Carotid Arteries- Normal color. Moisture- Normal Moisture. No carotid bruits. No JVD.  Chest and Lung Exam Auscultation: Breath Sounds:-Normal.  Cardiovascular Auscultation:Rythm- Regular. Murmurs & Other Heart Sounds:Auscultation of the heart reveals- No Murmurs.  Abdomen Inspection:-Inspeection Normal. Palpation/Percussion:Note:No mass. Palpation and Percussion of the abdomen reveal- Non Tender, Non Distended + BS, no rebound or guarding.    Neurologic Cranial Nerve exam:- CN III-XII intact(No nystagmus), symmetric smile. Strength:- 5/5 equal and symmetric strength both upper and lower extremities.  Lower ext- calfs symmetric. No pedal edmea.    Assessment & Plan:  For chf and htn continue Carvedilol 3.125 mg bid. Digoxine 0.125 mg daily. Entresto 24-26 one bid. Sprinolactone 25 mg daily. Also on torsemide 80 mg daily  For dvt and PE continue with Eliquis and referred to hematologist.  Follow up with pulmonologist and cardiologist and schedule.  Will get cmp, bnp, mg, tsh and t4 today.  For hx of chronic left knee pain placed xray of left knee.   Follow up in one month or as needed.  Time spent with patient today was 40  minutes which consisted of chart review, discussing diagnoses, work up, treatment and documentation.

## 2020-03-16 NOTE — Patient Instructions (Addendum)
For chf and htn continue Carvedilol 3.125 mg bid. Digoxine 0.125 mg daily. Entresto 24-26 one bid. Sprinolactone 25 mg daily. Also on torsemide 80 mg daily  For dvt and PE continue with Eliquis and referred to hematologist.  Follow up with pulmonologist and cardiologist and schedule.  Will get cmp, bnp, mg, tsh and t4 today.  For hx of chronic left knee pain placed xray of left knee.   Follow up in one month or as needed.

## 2020-03-17 ENCOUNTER — Telehealth: Payer: Self-pay | Admitting: Medical

## 2020-03-17 DIAGNOSIS — R7989 Other specified abnormal findings of blood chemistry: Secondary | ICD-10-CM

## 2020-03-17 LAB — T4, FREE: Free T4: 0.56 ng/dL — ABNORMAL LOW (ref 0.60–1.60)

## 2020-03-17 LAB — TSH: TSH: 19.36 u[IU]/mL — ABNORMAL HIGH (ref 0.35–4.50)

## 2020-03-17 NOTE — Telephone Encounter (Signed)
Thyroid peroxidase antibody order placed.

## 2020-03-18 ENCOUNTER — Other Ambulatory Visit: Payer: Federal, State, Local not specified - PPO

## 2020-03-18 DIAGNOSIS — R7989 Other specified abnormal findings of blood chemistry: Secondary | ICD-10-CM | POA: Diagnosis not present

## 2020-03-18 NOTE — Addendum Note (Signed)
Addended by: Mervin Kung A on: 03/18/2020 09:06 AM   Modules accepted: Orders

## 2020-03-18 NOTE — Progress Notes (Signed)
Advanced Heart Failure Clinic Note    PCP: Esperanza Richters, PA-C Primary Cardiologist: HF Cardiologist: Dr. Shirlee Latch  HPI: 60 y/o AAM w/ h/o HTN, HLD, obesity, severe knee OA, tobacco use and NICM, first diagnosed in 05/2018 when admitted for acute CHF. Echo showed LVEF 20-25%. RV was moderately reduced. LHC showed normal cors. Had not had cMRI.   Had 2D echo on 02/29/20 showing EF 25-30%. RV mild-moderately reduced.   Seen at [facility] (03/03/20) w/ complaints of 4 day h/o increased dyspnea w/ hypoxia and pleuritic chest pain. Noted to be in sinus tach w/ HR in the 130s. Given concern for PE, he was referred to [facility] ED where CT angio confirmed Rt middle and lower lobe PE. No evidence of RV strain. Admitted and started on IV heparin. LE Venous doppler + for Lt DVT.   Echo done (03/04/20) RV moderately enlarged w/ mild-moderately reduced systolic function. Not significantly changed from echo 02/29/20. BNP also normal at 27. He remained hemodynamically stable. Hospitalization c/b atrial flutter, s/p successful TEE with DCCV (03/07/20). He was discharged on GDMT, discharge weight 360 lbs.  Today he returns for HF post hospital follow up. Overall feeling fine, some fatigue when he over-exerts himself.  Denies increasing SOB, CP, dizziness, edema, or PND/Orthopnea. Appetite ok. No fever or chills. Does not weigh at home as his scale only goes up to 300 lbs. Taking all medications. Works for [employer].  ROS: All systems negative except as listed in HPI, PMH and Problem List.  SH:  Social History   Socioeconomic History  . Marital status: Married    Spouse name: Not on file  . Number of children: Not on file  . Years of education: Not on file  . Highest education level: Not on file  Occupational History  . Not on file  Tobacco Use  . Smoking status: Former Smoker    Packs/day: 1.00    Years: 20.00    Pack years: 20.00    Types: Cigarettes    Quit date: 05/23/2018    Years since quitting:  1.8  . Smokeless tobacco: Never Used  Substance and Sexual Activity  . Alcohol use: Yes    Alcohol/week: 0.0 standard drinks    Comment: 6 pack a week spread out over weekend  . Drug use: No  . Sexual activity: Yes  Other Topics Concern  . Not on file  Social History Narrative   [employer].   Married lives with wife, mother in law and daughter.  7 children.     Social Determinants of Health   Financial Resource Strain: Not on file  Food Insecurity: Not on file  Transportation Needs: Not on file  Physical Activity: Not on file  Stress: Not on file  Social Connections: Not on file  Intimate Partner Violence: Not on file   FH:  Family History  Problem Relation Age of Onset  . Hearing loss Mother   . Cancer Father   . Heart failure Other   . Heart failure Paternal Grandfather    Past Medical History:  Diagnosis Date  . AKI (acute kidney injury) (HCC) 03/03/2020  . Allergy   . Dilated cardiomyopathy (HCC)   . HTN (hypertension)   . Hyperlipidemia 05/24/2018   Current Outpatient Medications  Medication Sig Dispense Refill  . albuterol (PROVENTIL HFA;VENTOLIN HFA) 108 (90 Base) MCG/ACT inhaler Inhale 2 puffs into the lungs every 6 (six) hours as needed for wheezing or shortness of breath. 1 Inhaler 2  . allopurinol (  ZYLOPRIM) 100 MG tablet TAKE 1 TABLET(100 MG) BY MOUTH DAILY (Patient taking differently: Take 100 mg by mouth daily.) 30 tablet 6  . apixaban (ELIQUIS) 5 MG TABS tablet Take 2 tablets (10 mg total) by mouth 2 (two) times daily for 4 days, THEN 1 tablet (5 mg total) 2 (two) times daily. 196 tablet 0  . carvedilol (COREG) 3.125 MG tablet Take 1 tablet (3.125 mg total) by mouth 2 (two) times daily with a meal. 60 tablet 2  . dapagliflozin propanediol (FARXIGA) 10 MG TABS tablet Take 1 tablet (10 mg total) by mouth daily. 30 tablet 2  . digoxin (LANOXIN) 0.125 MG tablet Take 1 tablet (0.125 mg total) by mouth daily. 30 tablet 2  . ENTRESTO 24-26 MG TAKE 1 TABLET BY MOUTH TWICE  DAILY 90 tablet 3  . spironolactone (ALDACTONE) 25 MG tablet Take 1 tablet (25 mg total) by mouth daily. 30 tablet 1  . torsemide 40 MG TABS Take 80 mg by mouth daily. 60 tablet 2   No current facility-administered medications for this encounter.    Vitals:   03/21/20 0955  BP: 112/70  Pulse: 69  SpO2: 94%  Weight: (!) 163.1 kg (359 lb 9.6 oz)   Wt Readings from Last 3 Encounters:  03/21/20 (!) 163.1 kg (359 lb 9.6 oz)  03/16/20 (!) 163.4 kg (360 lb 3.2 oz)  03/08/20 (!) 165.4 kg (364 lb 10.3 oz)   PHYSICAL EXAM: General:  NAD. No resp difficulty HEENT: Normal Neck: Supple. No JVD, thick neck. Carotids 2+ bilat; no bruits. No lymphadenopathy or thryomegaly appreciated. Cor: PMI nondisplaced. Regular rate & rhythm. No rubs, gallops or murmurs. Lungs: Clear Abdomen: Obese, soft,  nontender, nondistended. No hepatosplenomegaly. No bruits or masses. Good bowel sounds. Extremities: No cyanosis, clubbing, rash, trace LE edema Neuro: Alert & oriented x 3, cranial nerves grossly intact. Moves all 4 extremities w/o difficulty. Affect pleasant.  ECG: SB 59 bpm (personally reviewed)  ASSESSMENT & PLAN: 1. Right-sided PE possibly with infarction and LLE DVT: Unprovoked. Patient has no FH of VTE. He has not been sedentary, has been working full time at the [employer]. Echo in 2/14 with moderate RV dilation/moderate dysfunction on my read, echo today (2/18) with similar appearance of the RV. The RV looks worse by echo now than it did on previous echo in 3/21. I think based on symptoms that he had the PE on 2/13. Symptoms are minimal at rest currently. HS-TnI and BNP surprisingly normal at admission.  - On Eliquis (PE treatment dosing) - He does have RV strain first noted on 2/14 echo, but Ithink that the PE occurred a number of days prior to admission. - On room air, no chest pain. 2. Atrial flutter: Typical flutter.  This was noted first at time of echo on 2/14. Suspect this was  triggered by PE.   - S/p successful TEE/DCCV 2/21 - Maintaining SR - off amio w/ bradycardia. HR in the 40s-50s but asymptomatic.  - Continue anticoagulation therapy w/ Eliquis.   - Continue Coreg 3.125 mg bid (bradycardia and soft BP)  - Will continue digoxin for now given concomitant biventricular dysfunction. Check dig level. - If recurrence, consider referral to EP for AFL ablation.  - Will order sleep study to r/o OSA. 3. Chronic systolic CHF: Echo from 2/18 was reviewed, shows EF around 20% with moderate RV dysfunction and dilated RV. Cath from 5/20 with no significant CAD. Cause of CMP is uncertain, EF has been low since 2020.  NICM. ?Viral myocarditis.  cMRI LV/RV EF 42% with no LGE. Chronically NYHA II-III, confounded by obesity, deconditioning and severe knee OA. Volume good today on exam. - Continue Coreg 3.125 mg bid.   - Continue spironolactone 25 mg daily. BMET today. - Continue digoxin 0.125 to help RV function. Monitor for further bradycardia. Check digoxin level today. - Continue Entresto 24/26 mg bid. Will not titrate today with BP 112/70. - Continue torsemide 80 mg daily. - Continue dapagliflozin 10 mg daily. No GU symptoms. 4. Gout: Flare in left foot, now improved.  - He is on allopurinol chronically.  - Continue colchicine. 5. Obesity: Body mass index is 51.6 kg/m. - Encourage portion control and increased physical activity as tolerated.  Follow up in 3 weeks for further medication titration, consider increasing Entresto.  Drew Rome, FNP-BC 03/21/20

## 2020-03-21 ENCOUNTER — Encounter (HOSPITAL_COMMUNITY): Payer: Self-pay

## 2020-03-21 ENCOUNTER — Other Ambulatory Visit: Payer: Self-pay

## 2020-03-21 ENCOUNTER — Ambulatory Visit (HOSPITAL_COMMUNITY)
Admit: 2020-03-21 | Discharge: 2020-03-21 | Disposition: A | Discharge status: Home / Self Care | Payer: Federal, State, Local not specified - PPO | Source: Ambulatory Visit | Attending: Family Medicine | Admitting: Family Medicine

## 2020-03-21 ENCOUNTER — Telehealth: Payer: Self-pay | Admitting: Medical

## 2020-03-21 VITALS — BP 112/70 | HR 69 | Wt 359.6 lb

## 2020-03-21 DIAGNOSIS — I130 Hypertensive heart and chronic kidney disease with heart failure and stage 1 through stage 4 chronic kidney disease, or unspecified chronic kidney disease: Secondary | ICD-10-CM | POA: Insufficient documentation

## 2020-03-21 DIAGNOSIS — R001 Bradycardia, unspecified: Secondary | ICD-10-CM | POA: Insufficient documentation

## 2020-03-21 DIAGNOSIS — Z86711 Personal history of pulmonary embolism: Secondary | ICD-10-CM

## 2020-03-21 DIAGNOSIS — Z79899 Other long term (current) drug therapy: Secondary | ICD-10-CM | POA: Diagnosis not present

## 2020-03-21 DIAGNOSIS — Z713 Dietary counseling and surveillance: Secondary | ICD-10-CM | POA: Insufficient documentation

## 2020-03-21 DIAGNOSIS — I2699 Other pulmonary embolism without acute cor pulmonale: Secondary | ICD-10-CM | POA: Diagnosis not present

## 2020-03-21 DIAGNOSIS — I483 Typical atrial flutter: Secondary | ICD-10-CM | POA: Insufficient documentation

## 2020-03-21 DIAGNOSIS — Z7901 Long term (current) use of anticoagulants: Secondary | ICD-10-CM | POA: Diagnosis not present

## 2020-03-21 DIAGNOSIS — M1A9XX0 Chronic gout, unspecified, without tophus (tophi): Secondary | ICD-10-CM

## 2020-03-21 DIAGNOSIS — Z8249 Family history of ischemic heart disease and other diseases of the circulatory system: Secondary | ICD-10-CM | POA: Diagnosis not present

## 2020-03-21 DIAGNOSIS — M109 Gout, unspecified: Secondary | ICD-10-CM | POA: Diagnosis not present

## 2020-03-21 DIAGNOSIS — Z86718 Personal history of other venous thrombosis and embolism: Secondary | ICD-10-CM | POA: Diagnosis not present

## 2020-03-21 DIAGNOSIS — I5022 Chronic systolic (congestive) heart failure: Secondary | ICD-10-CM | POA: Insufficient documentation

## 2020-03-21 DIAGNOSIS — I4892 Unspecified atrial flutter: Secondary | ICD-10-CM | POA: Diagnosis not present

## 2020-03-21 DIAGNOSIS — Z7984 Long term (current) use of oral hypoglycemic drugs: Secondary | ICD-10-CM | POA: Insufficient documentation

## 2020-03-21 DIAGNOSIS — Z87891 Personal history of nicotine dependence: Secondary | ICD-10-CM | POA: Insufficient documentation

## 2020-03-21 DIAGNOSIS — Z6841 Body Mass Index (BMI) 40.0 and over, adult: Secondary | ICD-10-CM | POA: Diagnosis not present

## 2020-03-21 LAB — CBC
HCT: 38.8 % — ABNORMAL LOW (ref 39.0–52.0)
Hemoglobin: 13 g/dL (ref 13.0–17.0)
MCH: 30.7 pg (ref 26.0–34.0)
MCHC: 33.5 g/dL (ref 30.0–36.0)
MCV: 91.7 fL (ref 80.0–100.0)
Platelets: 319 10*3/uL (ref 150–400)
RBC: 4.23 MIL/uL (ref 4.22–5.81)
RDW: 14.3 % (ref 11.5–15.5)
WBC: 6.8 10*3/uL (ref 4.0–10.5)
nRBC: 0 % (ref 0.0–0.2)

## 2020-03-21 LAB — BASIC METABOLIC PANEL
Anion gap: 11 (ref 5–15)
BUN: 19 mg/dL (ref 6–20)
CO2: 27 mmol/L (ref 22–32)
Calcium: 9.4 mg/dL (ref 8.9–10.3)
Chloride: 100 mmol/L (ref 98–111)
Creatinine, Ser: 1.23 mg/dL (ref 0.61–1.24)
GFR, Estimated: >60 mL/min (ref >60)
Glucose, Bld: 109 mg/dL — ABNORMAL HIGH (ref 70–99)
Potassium: 4.4 mmol/L (ref 3.5–5.1)
Sodium: 138 mmol/L (ref 135–145)

## 2020-03-21 LAB — DIGOXIN LEVEL: Digoxin Level: 0.7 ng/mL — ABNORMAL LOW (ref 0.8–2.0)

## 2020-03-21 LAB — THYROID PEROXIDASE ANTIBODY: Thyroperoxidase Ab SerPl-aCnc: >900 IU/mL — ABNORMAL HIGH (ref <9)

## 2020-03-21 MED ORDER — LEVOTHYROXINE SODIUM 25 MCG PO TABS: 25.0000 ug | ORAL_TABLET | Freq: Every day | ORAL | 11 refills | Status: DC

## 2020-03-21 NOTE — Telephone Encounter (Signed)
Rx levothyroxine sent to pt pharmacy. 

## 2020-03-21 NOTE — Progress Notes (Signed)
Patient downloaded App, educated and sent with home sleep study device.  Insurance prior Berkley Harvey is being investigated and patient is aware that I call as soon as we are sure that insurance will cover.

## 2020-03-21 NOTE — Patient Instructions (Addendum)
EKG done today.  Labs done today. We will contact you only if your labs are abnormal.  No medication changes were made. Please continue all current medications as prescribed.  Your physician recommends that you schedule a follow-up appointment in: 3-4 weeks   If you have any questions or concerns before your next appointment please send [place] a message through [place] or call our office at [phone].    TO LEAVE A MESSAGE FOR THE NURSE SELECT OPTION 2, PLEASE LEAVE A MESSAGE INCLUDING: . YOUR NAME . DATE OF BIRTH . CALL BACK NUMBER . REASON FOR CALL**this is important as we prioritize the call backs  YOU WILL RECEIVE A CALL BACK THE SAME DAY AS LONG AS YOU CALL BEFORE 4:00 PM   Do the following things EVERYDAY: 1) Weigh yourself in the morning before breakfast. Write it down and keep it in a log. 2) Take your medicines as prescribed 3) Eat low salt foods--Limit salt (sodium) to 2000 mg per day.  4) Stay as active as you can everyday 5) Limit all fluids for the day to less than 2 liters   At the [facility], you and your health needs are our priority. As part of our continuing mission to provide you with exceptional heart care, we have created designated Provider Care Teams. These Care Teams include your primary Cardiologist (physician) and Advanced Practice Providers (APPs- Physician Assistants and Nurse Practitioners) who all work together to provide you with the care you need, when you need it.   You may see any of the following providers on your designated Care Team at your next follow up: Marland Kitchen Dr Arvilla Meres . Dr [name] . Tonye Becket, NP . Robbie Lis, PA . Karle Plumber, PharmD   Please be sure to bring in all your medications bottles to every appointment.

## 2020-03-22 ENCOUNTER — Telehealth: Payer: Self-pay | Admitting: *Deleted

## 2020-03-22 NOTE — Telephone Encounter (Signed)
Per referral called patient and gave upcoming appts - mailed welcome packet with calendar

## 2020-03-24 ENCOUNTER — Telehealth (HOSPITAL_COMMUNITY): Payer: Self-pay | Admitting: Surgery

## 2020-03-24 NOTE — Telephone Encounter (Signed)
I called Mr. Nedved to let him know to proceed with home sleep study and that no insurance approval was needed.  I was unable to leave a voicemail.

## 2020-03-25 LAB — BETA-2-GLYCOPROTEIN I ABS, IGG/M/A
Beta-2 Glyco I IgG: <9 GPI IgG units (ref 0–20)
Beta-2-Glycoprotein I IgA: <9 GPI IgA units (ref 0–25)
Beta-2-Glycoprotein I IgM: <9 GPI IgM units (ref 0–32)

## 2020-03-25 LAB — LUPUS ANTICOAGULANT PANEL
DRVVT: 44.3 s (ref 0.0–47.0)
PTT Lupus Anticoagulant: 43.2 s (ref 0.0–51.9)

## 2020-03-25 LAB — FACTOR 5 LEIDEN

## 2020-03-25 LAB — CARDIOLIPIN ANTIBODIES, IGG, IGM, IGA
Anticardiolipin IgA: <9 APL U/mL (ref 0–11)
Anticardiolipin IgG: <9 GPL U/mL (ref 0–14)
Anticardiolipin IgM: <9 MPL U/mL (ref 0–12)

## 2020-03-25 LAB — PROTHROMBIN GENE MUTATION

## 2020-03-28 ENCOUNTER — Telehealth (HOSPITAL_COMMUNITY): Payer: Self-pay | Admitting: Surgery

## 2020-03-28 NOTE — Telephone Encounter (Signed)
I attempted to contact patient related to his ordered sleep study.  I was unable to leave a message.

## 2020-04-01 ENCOUNTER — Telehealth (HOSPITAL_COMMUNITY): Payer: Self-pay | Admitting: Surgery

## 2020-04-01 NOTE — Telephone Encounter (Signed)
I called patient related to completing ordered home sleep study.  I was unable to leave message secondary to the fact no voicemail set up.

## 2020-04-04 ENCOUNTER — Other Ambulatory Visit: Payer: Self-pay | Admitting: Adult Health

## 2020-04-05 ENCOUNTER — Other Ambulatory Visit: Payer: Self-pay | Admitting: Cardiology

## 2020-04-06 ENCOUNTER — Other Ambulatory Visit: Payer: Self-pay

## 2020-04-06 ENCOUNTER — Ambulatory Visit: Payer: Federal, State, Local not specified - PPO | Admitting: Cardiology

## 2020-04-06 ENCOUNTER — Encounter: Payer: Self-pay | Admitting: Cardiology

## 2020-04-06 VITALS — BP 130/60 | HR 77 | Ht 70.0 in | Wt 368.0 lb

## 2020-04-06 DIAGNOSIS — I428 Other cardiomyopathies: Secondary | ICD-10-CM

## 2020-04-06 DIAGNOSIS — I420 Dilated cardiomyopathy: Secondary | ICD-10-CM

## 2020-04-06 DIAGNOSIS — E782 Mixed hyperlipidemia: Secondary | ICD-10-CM

## 2020-04-06 DIAGNOSIS — I5020 Unspecified systolic (congestive) heart failure: Secondary | ICD-10-CM

## 2020-04-06 DIAGNOSIS — I483 Typical atrial flutter: Secondary | ICD-10-CM

## 2020-04-06 DIAGNOSIS — I10 Essential (primary) hypertension: Secondary | ICD-10-CM

## 2020-04-06 MED ORDER — APIXABAN 5 MG PO TABS: 5.0000 mg | ORAL_TABLET | Freq: Two times a day (BID) | ORAL | 1 refills | Status: DC

## 2020-04-06 MED ORDER — DAPAGLIFLOZIN PROPANEDIOL 10 MG PO TABS: 10.0000 mg | ORAL_TABLET | Freq: Every day | ORAL | 2 refills | Status: DC

## 2020-04-06 MED ORDER — ENTRESTO 49-51 MG PO TABS: 1.0000 | ORAL_TABLET | Freq: Two times a day (BID) | ORAL | 3 refills | Status: DC

## 2020-04-06 MED ORDER — CARVEDILOL 3.125 MG PO TABS: 3.1250 mg | ORAL_TABLET | Freq: Two times a day (BID) | ORAL | 2 refills | Status: DC

## 2020-04-06 MED ORDER — APIXABAN 5 MG PO TABS: 5.0000 mg | ORAL_TABLET | Freq: Two times a day (BID) | ORAL | 3 refills | Status: DC

## 2020-04-06 MED ORDER — SPIRONOLACTONE 25 MG PO TABS: 25.0000 mg | ORAL_TABLET | Freq: Every day | ORAL | 1 refills | Status: DC

## 2020-04-06 NOTE — Progress Notes (Signed)
Cardiology Office Note:    Date:  04/06/2020   ID:  Drew PoundsWoodrow C Flamenco, DOB 08/24/1960, MRN [id number]  PCP:  Esperanza RichtersSaguier, Edward, PA-C  Cardiologist:  Thomasene RippleKardie Karagan Lehr, DO  Electrophysiologist:  None   Referring MD: Marisue BrooklynSaguier, Edward, PA-C   I feel a better  History of Present Illness:    Drew [name] is a 60 y.o. male with a hx of hx of nonischemic cardiomyopathy EF on echo 20 to 25% in March 2021,heart failure with reduced ejection fraction, tobacco use, hyperlipidemia, pulmonary embolism recently diagnosed, atrial flutter now on Eliquis and digoxin and hypertension.  I saw the patient on February 05, 2020 at that time we continued his medication regimen which included Coreg 12.5 mg twice daily, Aldactone 12.5 mg daily, Entresto 24-26 mg twice daily and refill his [place].  His diuretics were also continued.  We have patient get an echocardiogram.  When I saw the patient on March 03, 2020 at that time he was experiencing significant shortness of breath hypoxia and tachycardia was concerned and took the patient to the emergency department to rule out pulmonary embolism.  Indeed did have pulmonary embolism which was seen on his CT of the chest.  He was hospitalized and started on Eliquis.    While in the hospital patient did undergo TEE cardioversion as he did experience atrial flutter.  Since his hospitalization he has been doing well.  He is following up today.  He is here today with his wife.    Past Medical History:  Diagnosis Date  . AKI (acute kidney injury) (HCC) 03/03/2020  . Allergy   . Dilated cardiomyopathy (HCC)   . HTN (hypertension)   . Hyperlipidemia 05/24/2018    Past Surgical History:  Procedure Laterality Date  . CARDIOVERSION N/A 03/07/2020   Procedure: CARDIOVERSION;  Surgeon: Laurey MoraleMcLean, Dalton S, MD;  [facility];  Service: Cardiovascular;  Laterality: N/A;  . KNEE ARTHROSCOPY  2009  . RIGHT/LEFT HEART CATH AND CORONARY ANGIOGRAPHY N/A 05/26/2018    Procedure: RIGHT/LEFT HEART CATH AND CORONARY ANGIOGRAPHY;  Surgeon: Lyn RecordsSmith, Henry W, MD;  Location: [facility] INVASIVE CV LAB;  Service: Cardiovascular;  Laterality: N/A;  . TEE WITHOUT CARDIOVERSION N/A 03/07/2020   Procedure: TRANSESOPHAGEAL ECHOCARDIOGRAM (TEE);  Surgeon: Laurey MoraleMcLean, Dalton S, MD;  [facility];  Service: Cardiovascular;  Laterality: N/A;  . THROAT SURGERY  2005    Current Medications: Current Meds  Medication Sig  . albuterol (PROVENTIL HFA;VENTOLIN HFA) 108 (90 Base) MCG/ACT inhaler Inhale 2 puffs into the lungs every 6 (six) hours as needed for wheezing or shortness of breath.  . allopurinol (ZYLOPRIM) 100 MG tablet TAKE 1 TABLET(100 MG) BY MOUTH DAILY (Patient taking differently: Take 100 mg by mouth daily.)  . apixaban (ELIQUIS) 5 MG TABS tablet Take 2 tablets (10 mg total) by mouth 2 (two) times daily for 4 days, THEN 1 tablet (5 mg total) 2 (two) times daily.  . digoxin (LANOXIN) 0.125 MG tablet Take 1 tablet (0.125 mg total) by mouth daily.  Marland Kitchen. levothyroxine (SYNTHROID) 25 MCG tablet Take 1 tablet (25 mcg total) by mouth daily before breakfast.  . sacubitril-valsartan (ENTRESTO) 49-51 MG Take 1 tablet by mouth 2 (two) times daily.  Marland Kitchen. torsemide 40 MG TABS Take 80 mg by mouth daily.  . [DISCONTINUED] carvedilol (COREG) 3.125 MG tablet Take 1 tablet (3.125 mg total) by mouth 2 (two) times daily with a meal.  . [DISCONTINUED] dapagliflozin propanediol (FARXIGA) 10 MG TABS tablet Take 1 tablet (10 mg total)  by mouth daily.  . [DISCONTINUED] ENTRESTO 24-26 MG TAKE 1 TABLET BY MOUTH TWICE DAILY  . [DISCONTINUED] spironolactone (ALDACTONE) 25 MG tablet Take 1 tablet (25 mg total) by mouth daily.     Allergies:   Tetracyclines & related   Social History   Socioeconomic History  . Marital status: Married    Spouse name: Not on file  . Number of children: Not on file  . Years of education: Not on file  . Highest education level: Not on file  Occupational History  . Not  on file  Tobacco Use  . Smoking status: Former Smoker    Packs/day: 1.00    Years: 20.00    Pack years: 20.00    Types: Cigarettes    Quit date: 05/23/2018    Years since quitting: 1.8  . Smokeless tobacco: Never Used  Substance and Sexual Activity  . Alcohol use: Yes    Alcohol/week: 0.0 standard drinks    Comment: 6 pack a week spread out over weekend  . Drug use: No  . Sexual activity: Yes  Other Topics Concern  . Not on file  Social History Narrative   [employer].   Married lives with wife, mother in law and daughter.  7 children.     Social Determinants of Health   Financial Resource Strain: Not on file  Food Insecurity: Not on file  Transportation Needs: Not on file  Physical Activity: Not on file  Stress: Not on file  Social Connections: Not on file     Family History: The patient's family history includes Cancer in his father; Hearing loss in his mother; Heart failure in his paternal grandfather and another family member.  ROS:   Review of Systems  Constitution: Negative for decreased appetite, fever and weight gain.  HENT: Negative for congestion, ear discharge, hoarse voice and sore throat.   Eyes: Negative for discharge, redness, vision loss in right eye and visual halos.  Cardiovascular: Negative for chest pain, dyspnea on exertion, leg swelling, orthopnea and palpitations.  Respiratory: Negative for cough, hemoptysis, shortness of breath and snoring.   Endocrine: Negative for heat intolerance and polyphagia.  Hematologic/Lymphatic: Negative for bleeding problem. Does not bruise/bleed easily.  Skin: Negative for flushing, nail changes, rash and suspicious lesions.  Musculoskeletal: Negative for arthritis, joint pain, muscle cramps, myalgias, neck pain and stiffness.  Gastrointestinal: Negative for abdominal pain, bowel incontinence, diarrhea and excessive appetite.  Genitourinary: Negative for decreased libido, genital sores and incomplete emptying.  Neurological:  Negative for brief paralysis, focal weakness, headaches and loss of balance.  Psychiatric/Behavioral: Negative for altered mental status, depression and suicidal ideas.  Allergic/Immunologic: Negative for HIV exposure and persistent infections.    EKGs/Labs/Other Studies Reviewed:    The following studies were reviewed today:   EKG: None today  Transesophageal echocardiogram March 07, 2020 IMPRESSIONS  1. Left ventricular ejection fraction, by estimation, is 20 to 25%. The  left ventricle has normal function. The left ventricle demonstrates global  hypokinesis. The left ventricular internal cavity size was moderately  dilated.  2. Right ventricular systolic function is moderately reduced. The right  ventricular size is mildly enlarged.  3. Left atrial size was moderately dilated. No left atrial/left atrial  appendage thrombus was detected.  4. Right atrial size was mildly dilated.  5. The mitral valve is normal in structure. Trivial mitral valve  regurgitation. No evidence of mitral stenosis.  6. The aortic valve is tricuspid. Aortic valve regurgitation is not  visualized. No aortic  stenosis is present.  7. Normal caliber thoracic aorta with minimal plaque.  8. No PFO or ASD by color doppler.   Recent Labs: 03/04/2020: B Natriuretic Peptide 27.0 03/16/2020: ALT 21; Magnesium 2.4; Pro B Natriuretic peptide (BNP) 11.0; TSH 19.36 03/21/2020: BUN 19; Creatinine, Ser 1.23; Hemoglobin 13.0; Platelets 319; Potassium 4.4; Sodium 138  Recent Lipid Panel    Component Value Date/Time   CHOL 152 05/25/2018 0343   TRIG 69 05/25/2018 0343   HDL 36 (L) 05/25/2018 0343   CHOLHDL 4.2 05/25/2018 0343   VLDL 14 05/25/2018 0343   LDLCALC 102 (H) 05/25/2018 0343    Physical Exam:    VS:  BP 130/60   Pulse 77   Ht 5\' 10"  (1.778 m)   Wt (!) 368 lb (166.9 kg)   SpO2 95%   BMI 52.80 kg/m     Wt Readings from Last 3 Encounters:  04/06/20 (!) 368 lb (166.9 kg)  03/21/20 (!) 359 lb  9.6 oz (163.1 kg)  03/16/20 (!) 360 lb 3.2 oz (163.4 kg)     GEN: Well nourished, well developed in no acute distress HEENT: Normal NECK: No JVD; No carotid bruits LYMPHATICS: No lymphadenopathy CARDIAC: S1S2 noted,RRR, no murmurs, rubs, gallops RESPIRATORY:  Clear to auscultation without rales, wheezing or rhonchi  ABDOMEN: Soft, non-tender, non-distended, +bowel sounds, no guarding. EXTREMITIES: No edema, No cyanosis, no clubbing MUSCULOSKELETAL:  No deformity  SKIN: Warm and dry NEUROLOGIC:  Alert and oriented x 3, non-focal PSYCHIATRIC:  Normal affect, good insight  ASSESSMENT:    1. Essential hypertension   2. Nonischemic cardiomyopathy (HCC)   3. HFrEF (heart failure with reduced ejection fraction) (HCC)   4. Dilated cardiomyopathy (HCC)   5. Typical atrial flutter (HCC)   6. Mixed hyperlipidemia   7. Obesity, Class III, BMI 40-49.9 (morbid obesity) (HCC)    PLAN:     He tells me shortness of breath has improved.  He also appears to be euvolemic.  Today I am going to increase his Entresto to 49-51 mg daily as he can tolerate this.  I am hoping with his beta-blocker and his Aldactone it can help with his cardiomyopathy.  With his diagnosis of typical atrial flutter he is now on Eliquis, carvedilol and digoxin.  This may be contributing to his depressed ejection fraction.  He will may to see our EP colleagues as he may benefit from flutter ablation.  I plan to place a monitor the patient to assess if he is is experiencing recurrence of atrial flutter.  The patient understands the need to lose weight with diet and exercise. We have discussed specific strategies for this.  Blood pressure is acceptable, continue to monitor blood pressure with the increased dose of Entresto.  The patient is in agreement with the above plan. The patient left the office in stable condition.  The patient will follow up in 4 weeks or sooner if needed.   Medication Adjustments/Labs and Tests  Ordered: Current medicines are reviewed at length with the patient today.  Concerns regarding medicines are outlined above.  No orders of the defined types were placed in this encounter.  Meds ordered this encounter  Medications  . sacubitril-valsartan (ENTRESTO) 49-51 MG    Sig: Take 1 tablet by mouth 2 (two) times daily.    Dispense:  180 tablet    Refill:  3  . carvedilol (COREG) 3.125 MG tablet    Sig: Take 1 tablet (3.125 mg total) by mouth 2 (two) times daily  with a meal.    Dispense:  60 tablet    Refill:  2  . dapagliflozin propanediol (FARXIGA) 10 MG TABS tablet    Sig: Take 1 tablet (10 mg total) by mouth daily.    Dispense:  30 tablet    Refill:  2  . spironolactone (ALDACTONE) 25 MG tablet    Sig: Take 1 tablet (25 mg total) by mouth daily.    Dispense:  30 tablet    Refill:  1    Patient Instructions  Medication Instructions:  Your physician has recommended you make the following change in your medication:  INCREASE: Entresto 49-51 mg twice daily *If you need a refill on your cardiac medications before your next appointment, please call your pharmacy*   Lab Work: None If you have labs (blood work) drawn today and your tests are completely normal, you will receive your results only by: Marland Kitchen MyChart Message (if you have MyChart) OR . A paper copy in the mail If you have any lab test that is abnormal or we need to change your treatment, we will call you to review the results.   Testing/Procedures: None   Follow-Up: At [facility], you and your health needs are our priority.  As part of our continuing mission to provide you with exceptional heart care, we have created designated Provider Care Teams.  These Care Teams include your primary Cardiologist (physician) and Advanced Practice Providers (APPs -  Physician Assistants and Nurse Practitioners) who all work together to provide you with the care you need, when you need it.  We recommend signing up for the  patient portal called "MyChart".  Sign up information is provided on this After Visit Summary.  MyChart is used to connect with patients for Virtual Visits (Telemedicine).  Patients are able to view lab/test results, encounter notes, upcoming appointments, etc.  Non-urgent messages can be sent to your provider as well.   To learn more about what you can do with MyChart, go to [email or web address].    Your next appointment:   1 month(s)  The format for your next appointment:   Virtual Visit   Provider:   Thomasene Ripple, DO   Other Instructions      Adopting a Healthy Lifestyle.  Know what a healthy weight is for you (roughly BMI <25) and aim to maintain this   Aim for 7+ servings of fruits and vegetables daily   65-80+ fluid ounces of water or unsweet tea for healthy kidneys   Limit to max 1 drink of alcohol per day; avoid smoking/tobacco   Limit animal fats in diet for cholesterol and heart health - choose grass fed whenever available   Avoid highly processed foods, and foods high in saturated/trans fats   Aim for low stress - take time to unwind and care for your mental health   Aim for 150 min of moderate intensity exercise weekly for heart health, and weights twice weekly for bone health   Aim for 7-9 hours of sleep daily   When it comes to diets, agreement about the perfect plan isnt easy to find, even among the experts. Experts at the [facility] of [employer] developed an idea known as the Healthy Eating Plate. Just imagine a plate divided into logical, healthy portions.   The emphasis is on diet quality:   Load up on vegetables and fruits - one-half of your plate: Aim for color and variety, and remember that potatoes dont count.   Go for whole  grains - one-quarter of your plate: Whole wheat, barley, wheat berries, quinoa, oats, brown rice, and foods made with them. If you want pasta, go with whole wheat pasta.   Protein power - one-quarter of your plate:  Fish, chicken, beans, and nuts are all healthy, versatile protein sources. Limit red meat.   The diet, however, does go beyond the plate, offering a few other suggestions.   Use healthy plant oils, such as olive, canola, soy, corn, sunflower and peanut. Check the labels, and avoid partially hydrogenated oil, which have unhealthy trans fats.   If youre thirsty, drink water. Coffee and tea are good in moderation, but skip sugary drinks and limit milk and dairy products to one or two daily servings.   The type of carbohydrate in the diet is more important than the amount. Some sources of carbohydrates, such as vegetables, fruits, whole grains, and beans-are healthier than others.   Finally, stay active  Signed, Thomasene Ripple, DO  04/06/2020 2:04 PM    [facility]

## 2020-04-06 NOTE — Patient Instructions (Signed)
Medication Instructions:  Your physician has recommended you make the following change in your medication:  INCREASE: Entresto 49-51 mg twice daily *If you need a refill on your cardiac medications before your next appointment, please call your pharmacy*   Lab Work: None If you have labs (blood work) drawn today and your tests are completely normal, you will receive your results only by: Marland Kitchen MyChart Message (if you have MyChart) OR . A paper copy in the mail If you have any lab test that is abnormal or we need to change your treatment, we will call you to review the results.   Testing/Procedures: None   Follow-Up: At [facility], you and your health needs are our priority.  As part of our continuing mission to provide you with exceptional heart care, we have created designated Provider Care Teams.  These Care Teams include your primary Cardiologist (physician) and Advanced Practice Providers (APPs -  Physician Assistants and Nurse Practitioners) who all work together to provide you with the care you need, when you need it.  We recommend signing up for the patient portal called "MyChart".  Sign up information is provided on this After Visit Summary.  MyChart is used to connect with patients for Virtual Visits (Telemedicine).  Patients are able to view lab/test results, encounter notes, upcoming appointments, etc.  Non-urgent messages can be sent to your provider as well.   To learn more about what you can do with MyChart, go to [email or web address].    Your next appointment:   1 month(s)  The format for your next appointment:   Virtual Visit   Provider:   Thomasene Ripple, DO   Other Instructions

## 2020-04-11 ENCOUNTER — Other Ambulatory Visit: Payer: Self-pay | Admitting: Family

## 2020-04-11 DIAGNOSIS — I2699 Other pulmonary embolism without acute cor pulmonale: Secondary | ICD-10-CM

## 2020-04-11 DIAGNOSIS — I824Y2 Acute embolism and thrombosis of unspecified deep veins of left proximal lower extremity: Secondary | ICD-10-CM

## 2020-04-12 ENCOUNTER — Inpatient Hospital Stay: Payer: Federal, State, Local not specified - PPO

## 2020-04-12 ENCOUNTER — Other Ambulatory Visit: Payer: Self-pay

## 2020-04-12 ENCOUNTER — Inpatient Hospital Stay: Payer: Federal, State, Local not specified - PPO | Attending: Family | Admitting: Family

## 2020-04-12 ENCOUNTER — Encounter: Payer: Self-pay | Admitting: Family

## 2020-04-12 VITALS — BP 115/65 | HR 63 | Temp 98.3°F | Resp 20 | Ht 70.0 in | Wt 372.0 lb

## 2020-04-12 DIAGNOSIS — I824Y2 Acute embolism and thrombosis of unspecified deep veins of left proximal lower extremity: Secondary | ICD-10-CM

## 2020-04-12 DIAGNOSIS — Z809 Family history of malignant neoplasm, unspecified: Secondary | ICD-10-CM | POA: Insufficient documentation

## 2020-04-12 DIAGNOSIS — Z87891 Personal history of nicotine dependence: Secondary | ICD-10-CM | POA: Diagnosis not present

## 2020-04-12 DIAGNOSIS — I509 Heart failure, unspecified: Secondary | ICD-10-CM | POA: Insufficient documentation

## 2020-04-12 DIAGNOSIS — Z79899 Other long term (current) drug therapy: Secondary | ICD-10-CM | POA: Insufficient documentation

## 2020-04-12 DIAGNOSIS — I110 Hypertensive heart disease with heart failure: Secondary | ICD-10-CM | POA: Diagnosis not present

## 2020-04-12 DIAGNOSIS — Z7901 Long term (current) use of anticoagulants: Secondary | ICD-10-CM | POA: Diagnosis not present

## 2020-04-12 DIAGNOSIS — Z8249 Family history of ischemic heart disease and other diseases of the circulatory system: Secondary | ICD-10-CM | POA: Insufficient documentation

## 2020-04-12 DIAGNOSIS — I2699 Other pulmonary embolism without acute cor pulmonale: Secondary | ICD-10-CM | POA: Diagnosis not present

## 2020-04-12 DIAGNOSIS — I82452 Acute embolism and thrombosis of left peroneal vein: Secondary | ICD-10-CM | POA: Diagnosis not present

## 2020-04-12 LAB — CBC WITH DIFFERENTIAL (CANCER CENTER ONLY)
Abs Immature Granulocytes: 0.02 10*3/uL (ref 0.00–0.07)
Basophils Absolute: 0.1 10*3/uL (ref 0.0–0.1)
Basophils Relative: 1 %
Eosinophils Absolute: 0.4 10*3/uL (ref 0.0–0.5)
Eosinophils Relative: 5 %
HCT: 37.7 % — ABNORMAL LOW (ref 39.0–52.0)
Hemoglobin: 12.5 g/dL — ABNORMAL LOW (ref 13.0–17.0)
Immature Granulocytes: 0 %
Lymphocytes Relative: 27 %
Lymphs Abs: 1.9 10*3/uL (ref 0.7–4.0)
MCH: 30 pg (ref 26.0–34.0)
MCHC: 33.2 g/dL (ref 30.0–36.0)
MCV: 90.6 fL (ref 80.0–100.0)
Monocytes Absolute: 0.5 10*3/uL (ref 0.1–1.0)
Monocytes Relative: 7 %
Neutro Abs: 4.1 10*3/uL (ref 1.7–7.7)
Neutrophils Relative %: 60 %
Platelet Count: 254 10*3/uL (ref 150–400)
RBC: 4.16 MIL/uL — ABNORMAL LOW (ref 4.22–5.81)
RDW: 15.1 % (ref 11.5–15.5)
WBC Count: 6.9 10*3/uL (ref 4.0–10.5)
nRBC: 0 % (ref 0.0–0.2)

## 2020-04-12 LAB — CMP (CANCER CENTER ONLY)
ALT: 16 U/L (ref 0–44)
AST: 14 U/L — ABNORMAL LOW (ref 15–41)
Albumin: 4.5 g/dL (ref 3.5–5.0)
Alkaline Phosphatase: 50 U/L (ref 38–126)
Anion gap: 10 (ref 5–15)
BUN: 28 mg/dL — ABNORMAL HIGH (ref 6–20)
CO2: 27 mmol/L (ref 22–32)
Calcium: 9.5 mg/dL (ref 8.9–10.3)
Chloride: 99 mmol/L (ref 98–111)
Creatinine: 1.31 mg/dL — ABNORMAL HIGH (ref 0.61–1.24)
GFR, Estimated: >60 mL/min (ref >60)
Glucose, Bld: 138 mg/dL — ABNORMAL HIGH (ref 70–99)
Potassium: 4 mmol/L (ref 3.5–5.1)
Sodium: 136 mmol/L (ref 135–145)
Total Bilirubin: 0.5 mg/dL (ref 0.3–1.2)
Total Protein: 7.8 g/dL (ref 6.5–8.1)

## 2020-04-12 LAB — LACTATE DEHYDROGENASE: LDH: 156 U/L (ref 98–192)

## 2020-04-12 NOTE — Progress Notes (Signed)
Hematology/Oncology Consultation   Name: Drew [name]      MRN: [id number]    Location: Room/bed info not found  Date: 04/12/2020 Time:3:21 PM   REFERRING PHYSICIAN: Esperanza Richters, PA-C  REASON FOR CONSULT: Acute PE and acute DVT or left peroneal vein   DIAGNOSIS: Right lower and middle lobe PEs with no evidence of right heart strain and left lower extremity DVT involving the left peroneal veins  HISTORY OF PRESENT ILLNESS: Drew [name] is a very pleasant 60 yo African American gentleman with recent diagnosis of right lower and middle lobe PEs with no evidence of right heart strain and left lower extremity DVT involving the left peroneal veins.  He has no prior history of thrombus.  Hyper coag work up done in hospital was negative.  He had gone to the ED with fatigue and SOB and during work up was found to be in artrial flutter. He had a TEE and was cardioverted.   EF was 20-25% at that time. [place] and CT angio were performed for persistent SOB and revealed PE and DVT.  He is doing well on Eliquis. His SOB has resolved. He still has some pain and stiffness in the left leg.  He has history of CHF and HTN.  He is on diuretics to reduce fluid retention which he states is something he has to stay on top of. He has chronic swelling in his lower extremities and elevates his feet as well to help reduce.  He stopped smoking in May 2020. No history of ETOH  or recreational drug use.   He has never taken testosterone supplementation.  No family history of thrombus that he knows of.  His maternal grandfather had history of stroke.  He has some mild fatigue at times and takes and break to rest when he needs to.  No fever, chills, n/v, cough, rash, dizziness, SOB, chest pain, palpitations, abdominal pain or changes in bowel or bladder habits.  He notes occasional positional numbness and tingling in his feet that resolves when he moves.  He states that his knees and lower back are "bad". He uses CBD oil to  help with knee pain and takes CBD gummy prior to any physical activity.  He is not very active at home due to his knees.  He enjoys spending time with his family and works for his Therap[employer], [employer].   ROS: All other 10 point review of systems is negative.   PAST MEDICAL HISTORY:   Past Medical History:  Diagnosis Date  . AKI (acute kidney injury) (HCC) 03/03/2020  . Allergy   . Dilated cardiomyopathy (HCC)   . HTN (hypertension)   . Hyperlipidemia 05/24/2018    ALLERGIES: Allergies  Allergen Reactions  . Tetracyclines & Related Hives and Itching      MEDICATIONS:  Current Outpatient Medications on File Prior to Visit  Medication Sig Dispense Refill  . albuterol (PROVENTIL HFA;VENTOLIN HFA) 108 (90 Base) MCG/ACT inhaler Inhale 2 puffs into the lungs every 6 (six) hours as needed for wheezing or shortness of breath. 1 Inhaler 2  . allopurinol (ZYLOPRIM) 100 MG tablet TAKE 1 TABLET(100 MG) BY MOUTH DAILY (Patient taking differently: Take 100 mg by mouth daily.) 30 tablet 6  . apixaban (ELIQUIS) 5 MG TABS tablet Take 1 tablet (5 mg total) by mouth 2 (two) times daily. 180 tablet 3  . carvedilol (COREG) 3.125 MG tablet Take 1 tablet (3.125 mg total) by mouth 2 (two) times daily with a meal. 60  tablet 2  . dapagliflozin propanediol (FARXIGA) 10 MG TABS tablet Take 1 tablet (10 mg total) by mouth daily. 30 tablet 2  . digoxin (LANOXIN) 0.125 MG tablet Take 1 tablet (0.125 mg total) by mouth daily. 30 tablet 2  . levothyroxine (SYNTHROID) 25 MCG tablet Take 1 tablet (25 mcg total) by mouth daily before breakfast. 30 tablet 11  . sacubitril-valsartan (ENTRESTO) 49-51 MG Take 1 tablet by mouth 2 (two) times daily. 180 tablet 3  . spironolactone (ALDACTONE) 25 MG tablet Take 1 tablet (25 mg total) by mouth daily. 30 tablet 1  . torsemide 40 MG TABS Take 80 mg by mouth daily. 60 tablet 2   No current facility-administered medications on file prior to visit.     PAST SURGICAL HISTORY Past  Surgical History:  Procedure Laterality Date  . CARDIOVERSION N/A 03/07/2020   Procedure: CARDIOVERSION;  Surgeon: Laurey Morale, MD;  Location: [facility] ENDOSCOPY;  Service: Cardiovascular;  Laterality: N/A;  . KNEE ARTHROSCOPY  2009  . RIGHT/LEFT HEART CATH AND CORONARY ANGIOGRAPHY N/A 05/26/2018   Procedure: RIGHT/LEFT HEART CATH AND CORONARY ANGIOGRAPHY;  Surgeon: Lyn Records, MD;  Location: [facility] INVASIVE CV LAB;  Service: Cardiovascular;  Laterality: N/A;  . TEE WITHOUT CARDIOVERSION N/A 03/07/2020   Procedure: TRANSESOPHAGEAL ECHOCARDIOGRAM (TEE);  Surgeon: Laurey Morale, MD;  Location: [facility] ENDOSCOPY;  Service: Cardiovascular;  Laterality: N/A;  . THROAT SURGERY  2005    FAMILY HISTORY: Family History  Problem Relation Age of Onset  . Hearing loss Mother   . Cancer Father   . Heart failure Other   . Heart failure Paternal Grandfather     SOCIAL HISTORY:  reports that he quit smoking about 22 months ago. His smoking use included cigarettes. He has a 20.00 pack-year smoking history. He has never used smokeless tobacco. He reports current alcohol use. He reports that he does not use drugs.  PERFORMANCE STATUS: The patient's performance status is 1 - Symptomatic but completely ambulatory  PHYSICAL EXAM: Most Recent Vital Signs: There were no vitals taken for this visit. BP 115/65 (BP Location: Left Arm, Patient Position: Sitting)   Pulse 63   Temp 98.3 F (36.8 C) (Oral)   Resp 20   Ht 5\' 10"  (1.778 m)   Wt (!) 372 lb (168.7 kg)   SpO2 98%   BMI 53.38 kg/m   General Appearance:    Alert, cooperative, no distress, appears stated age  Head:    Normocephalic, without obvious abnormality, atraumatic  Eyes:    PERRL, conjunctiva/corneas clear, EOM's intact, fundi    benign, both eyes             Throat:   Lips, mucosa, and tongue normal; teeth and gums normal  Neck:   Supple, symmetrical, trachea midline, no adenopathy;       thyroid:  No enlargement/tenderness/nodules; no  carotid   bruit or JVD  Back:     Symmetric, no curvature, ROM normal, no CVA tenderness  Lungs:     Clear to auscultation bilaterally, respirations unlabored  Chest wall:    No tenderness or deformity  Heart:    Regular rate and rhythm, S1 and S2 normal, no murmur, rub   or gallop  Abdomen:     Soft, non-tender, bowel sounds active all four quadrants,    no masses, no organomegaly        Extremities:   Extremities normal, atraumatic, no cyanosis or edema  Pulses:   2+ and symmetric all  extremities  Skin:   Skin color, texture, turgor normal, no rashes or lesions  Lymph nodes:   Cervical, supraclavicular, and axillary nodes normal  Neurologic:   CNII-XII intact. Normal strength, sensation and reflexes      throughout    LABORATORY DATA:  Results for orders placed or performed in visit on 04/12/20 (from the past 48 hour(s))  CBC with Differential (Cancer Center Only)     Status: Abnormal (Preliminary result)   Collection Time: 04/12/20  2:52 PM  Result Value Ref Range   WBC Count 6.9 4.0 - 10.5 K/uL   RBC 4.16 (L) 4.22 - 5.81 MIL/uL   Hemoglobin 12.5 (L) 13.0 - 17.0 g/dL   HCT 16.1 (L) 09.6 - 04.5 %   MCV 90.6 80.0 - 100.0 fL   MCH 30.0 26.0 - 34.0 pg   MCHC 33.2 30.0 - 36.0 g/dL   RDW 40.9 81.1 - 91.4 %   Platelet Count 254 150 - 400 K/uL   nRBC 0.0 0.0 - 0.2 %    Comment: Performed at [facility] at [facility], [address] [place], [place] [address]   Neutrophils Relative % PENDING %   Neutro Abs PENDING 1.7 - 7.7 K/uL   Band Neutrophils PENDING %   Lymphocytes Relative PENDING %   Lymphs Abs PENDING 0.7 - 4.0 K/uL   Monocytes Relative PENDING %   Monocytes Absolute PENDING 0.1 - 1.0 K/uL   Eosinophils Relative PENDING %   Eosinophils Absolute PENDING 0.0 - 0.5 K/uL   Basophils Relative PENDING %   Basophils Absolute PENDING 0.0 - 0.1 K/uL   WBC Morphology PENDING    RBC Morphology PENDING    Smear Review PENDING    Other PENDING %    nRBC PENDING 0 /100 WBC   Metamyelocytes Relative PENDING %   Myelocytes PENDING %   Promyelocytes Relative PENDING %   Blasts PENDING %   Immature Granulocytes PENDING %   Abs Immature Granulocytes PENDING 0.00 - 0.07 K/uL      RADIOGRAPHY: No results found.     PATHOLOGY: None   ASSESSMENT/PLAN: Mr. Taul is a very pleasant 60 yo African American gentleman with recent diagnosis of right lower and middle lobe PEs with no evidence of right heart strain and left lower extremity DVT involving the left peroneal veins with atrial flutter (TEE and cardioversion on 03/07/20).  He is doing well on Eliquis and his symptoms have improved.  We will have him continue his same regimen and plan to see him again in 2 months. At that time, we will repeat a CT angio and US of the left lower extremity to re-assess his response to anticoagulation.   All questions were answered and he is in agreement with the plan. The patient can contact our office with any questions or concerns. We can certainly see him sooner if needed.   The patient was discussed with Dr. Myna Hidalgo and he is in agreement with the aforementioned.   Emeline Gins, NP

## 2020-04-17 NOTE — Progress Notes (Signed)
Advanced Heart Failure Clinic Note    PCP: Mackie Pai, PA-C Primary Cardiologist: Dr Harriet Masson  HF Cardiologist: Dr. Aundra Dubin  HPI: 60 y/o AAM w/ h/o HTN, HLD, obesity, severe knee OA, tobacco use and NICM, first diagnosed in 05/2018 when admitted for acute CHF. Echo showed LVEF 20-25%. RV was moderately reduced. LHC showed normal cors. Had not had cMRI.   Had 2D echo on 02/29/20 showing EF 25-30%. RV mild-moderately reduced.   Seen at [place] (03/03/20) w/ complaints of 4 day h/o increased dyspnea w/ hypoxia and pleuritic chest pain. Noted to be in sinus tach w/ HR in the 130s. Given concern for PE, he was referred to [facility] ED where CT angio confirmed Rt middle and lower lobe PE. No evidence of RV strain. Admitted and started on IV heparin. LE Venous doppler + for Lt DVT.   Echo done (03/04/20) RV moderately enlarged w/ mild-moderately reduced systolic function. Not significantly changed from echo 02/29/20. BNP also normal at 27. He remained hemodynamically stable. Hospitalization c/b atrial flutter, s/p successful TEE with DCCV (03/07/20). He was discharged on GDMT, discharge weight 360 lbs.   Today he returns for HF follow up.Overall feeling fine. Uses eletric cart in the grocery store. Limited by knee pain and fatigue.  SOB with inclines. Denies PND/Orthopnea.Not very active. Appetite ok. Tries to eat low salt. Has been using CBD gummies for knee pain and this has increased his appetite.  No fever or chills. Weight at home has been trending up 360-->374.  pounds. Taking all medications. Currenlty not working. Out on short term disability. Works for [employer]. Lives with his wife.   ROS: All systems negative except as listed in HPI, PMH and Problem List.  SH:  Social History   Socioeconomic History  . Marital status: Married    Spouse name: Not on file  . Number of children: Not on file  . Years of education: Not on file  . Highest education level: Not on file  Occupational History  .  Not on file  Tobacco Use  . Smoking status: Former Smoker    Packs/day: 1.00    Years: 20.00    Pack years: 20.00    Types: Cigarettes    Quit date: 05/23/2018    Years since quitting: 1.9  . Smokeless tobacco: Never Used  Vaping Use  . Vaping Use: Never used  Substance and Sexual Activity  . Alcohol use: Yes    Alcohol/week: 0.0 standard drinks    Comment: 6 pack a week spread out over weekend  . Drug use: No  . Sexual activity: Yes  Other Topics Concern  . Not on file  Social History Narrative   [employer].   Married lives with wife, mother in law and daughter.  7 children.     Social Determinants of Health   Financial Resource Strain: Not on file  Food Insecurity: Not on file  Transportation Needs: Not on file  Physical Activity: Not on file  Stress: Not on file  Social Connections: Not on file  Intimate Partner Violence: Not on file   FH:  Family History  Problem Relation Age of Onset  . Hearing loss Mother   . Cancer Father   . Heart failure Other   . Heart failure Paternal Grandfather    Past Medical History:  Diagnosis Date  . AKI (acute kidney injury) ([place]) 03/03/2020  . Allergy   . Dilated cardiomyopathy ([place])   . HTN (hypertension)   . Hyperlipidemia 05/24/2018  Current Outpatient Medications  Medication Sig Dispense Refill  . albuterol (PROVENTIL HFA;VENTOLIN HFA) 108 (90 Base) MCG/ACT inhaler Inhale 2 puffs into the lungs every 6 (six) hours as needed for wheezing or shortness of breath. 1 Inhaler 2  . allopurinol (ZYLOPRIM) 100 MG tablet TAKE 1 TABLET(100 MG) BY MOUTH DAILY (Patient taking differently: Take 100 mg by mouth daily.) 30 tablet 6  . apixaban (ELIQUIS) 5 MG TABS tablet Take 1 tablet (5 mg total) by mouth 2 (two) times daily. 180 tablet 3  . carvedilol (COREG) 3.125 MG tablet Take 1 tablet (3.125 mg total) by mouth 2 (two) times daily with a meal. 60 tablet 2  . dapagliflozin propanediol (FARXIGA) 10 MG TABS tablet Take 1 tablet (10 mg total) by  mouth daily. 30 tablet 2  . digoxin (LANOXIN) 0.125 MG tablet Take 1 tablet (0.125 mg total) by mouth daily. 30 tablet 2  . levothyroxine (SYNTHROID) 25 MCG tablet Take 1 tablet (25 mcg total) by mouth daily before breakfast. 30 tablet 11  . sacubitril-valsartan (ENTRESTO) 49-51 MG Take 1 tablet by mouth 2 (two) times daily. 180 tablet 3  . spironolactone (ALDACTONE) 25 MG tablet Take 1 tablet (25 mg total) by mouth daily. 30 tablet 1  . torsemide (DEMADEX) 20 MG tablet TAKE 4 TABLETS (80 MG) BY MOUTH DAILY. (Patient taking differently: Take 80 mg by mouth daily. 1 additional tablet as needed in the evening) 120 tablet 2   No current facility-administered medications for this encounter.    Vitals:   04/18/20 1022  BP: 110/66  Pulse: 61  SpO2: 97%  Weight: (!) 169.5 kg (373 lb 9.6 oz)   Wt Readings from Last 3 Encounters:  04/18/20 (!) 169.5 kg (373 lb 9.6 oz)  04/12/20 (!) 168.7 kg (372 lb)  04/06/20 (!) 166.9 kg (368 lb)   PHYSICAL EXAM: General:  Well appearing. No resp difficulty. Walked in the clinic.  HEENT: normal Neck: supple. no JVD. Carotids 2+ bilat; no bruits. No lymphadenopathy or thryomegaly appreciated. Cor: PMI nondisplaced. Regular rate & rhythm. No rubs, gallops or murmurs. Lungs: clear Abdomen: obese, soft, nontender, nondistended. No hepatosplenomegaly. No bruits or masses. Good bowel sounds. Extremities: no cyanosis, clubbing, rash, edema Neuro: alert & orientedx3, cranial nerves grossly intact. moves all 4 extremities w/o difficulty. Affect pleasant  EKG: SR with occasional PVC 60 bpm personally reviewed.   ASSESSMENT & PLAN: 1. Right-sided PE possibly with infarction and LLE DVT: Unprovoked. Patient has no FH of VTE. He has not been sedentary, has been working full time at the [employer]. Echo in 2/14 with moderate RV dilation/moderate dysfunction on my read, echo today (2/18) with similar appearance of the RV. The RV looks worse by echo now than it did  on previous echo in 3/21. I think based on symptoms that he had the PE on 2/13.  - On Eliquis (PE treatment dosing) - He does have RV strain first noted on 2/14 echo, but PE likely occurred a number of days prior to admission. - Repeat ECHO in 8 weeks.  2. Atrial flutter: Typical flutter.  This was noted first at time of echo on 2/14. Suspect this was triggered by PE.   - S/P successful TEE/DCCV 2/21 -EKG -->SR today  - off amio w/ bradycardia. - Continue anticoagulation therapy w/ Eliquis.   - Continue Coreg 3.125 mg bid (bradycardia and soft BP)  - Continue digoxin for now given concomitant biventricular dysfunction.  - If recurrence, consider referral to EP for  AFL ablation.  - Needs to complete sleep study.  3. Chronic systolic CHF: Echo from 0/16 was reviewed, shows EF around 20% with moderate RV dysfunction and dilated RV. Cath from 5/20 with no significant CAD. Cause of CMP is uncertain, EF has been low since 2020. NICM. ?Viral myocarditis.  02/2020 cMRI LV/RV EF 42% with no LGE.  -NYHA III. Also limited by knee pain.  - Continue Coreg 3.125 mg bid.   - Continue spironolactone 25 mg daily. - Continue digoxin 0.125 to help RV function.  - Continue Entresto 24/26 mg bid.  - Volume status stable. Continue torsemide 80 mg daily. I think he is gaining weight with CBD gummies.  - Continue dapagliflozin 10 mg daily. No GU symptoms. 4. Gout:  - No recent flare.  - Continue allopurinol chronically.  - Continue colchicine. 5. Obesity: Body mass index is 53.61 kg/m.  Discussed portion control.  6. OSA Has sleep study kit at home.  Needs to complete.     Follow up 4 weeks with APP and 8 weeks with Dr Aundra Dubin and an ECHO.    Larken Urias NP-C

## 2020-04-18 ENCOUNTER — Ambulatory Visit (HOSPITAL_COMMUNITY)
Admission: RE | Admit: 2020-04-18 | Discharge: 2020-04-18 | Disposition: A | Discharge status: Home / Self Care | Payer: Federal, State, Local not specified - PPO | Source: Ambulatory Visit | Attending: Adult Health | Admitting: Adult Health

## 2020-04-18 ENCOUNTER — Other Ambulatory Visit: Payer: Self-pay

## 2020-04-18 ENCOUNTER — Encounter (HOSPITAL_COMMUNITY): Payer: Self-pay

## 2020-04-18 VITALS — BP 110/66 | HR 61 | Wt 373.6 lb

## 2020-04-18 DIAGNOSIS — Z7901 Long term (current) use of anticoagulants: Secondary | ICD-10-CM | POA: Diagnosis not present

## 2020-04-18 DIAGNOSIS — Z79899 Other long term (current) drug therapy: Secondary | ICD-10-CM | POA: Insufficient documentation

## 2020-04-18 DIAGNOSIS — R001 Bradycardia, unspecified: Secondary | ICD-10-CM | POA: Insufficient documentation

## 2020-04-18 DIAGNOSIS — Z6841 Body Mass Index (BMI) 40.0 and over, adult: Secondary | ICD-10-CM | POA: Diagnosis not present

## 2020-04-18 DIAGNOSIS — I483 Typical atrial flutter: Secondary | ICD-10-CM | POA: Insufficient documentation

## 2020-04-18 DIAGNOSIS — Z7984 Long term (current) use of oral hypoglycemic drugs: Secondary | ICD-10-CM | POA: Insufficient documentation

## 2020-04-18 DIAGNOSIS — G4733 Obstructive sleep apnea (adult) (pediatric): Secondary | ICD-10-CM | POA: Insufficient documentation

## 2020-04-18 DIAGNOSIS — I428 Other cardiomyopathies: Secondary | ICD-10-CM | POA: Diagnosis not present

## 2020-04-18 DIAGNOSIS — R0602 Shortness of breath: Secondary | ICD-10-CM | POA: Diagnosis not present

## 2020-04-18 DIAGNOSIS — I5020 Unspecified systolic (congestive) heart failure: Secondary | ICD-10-CM | POA: Diagnosis not present

## 2020-04-18 DIAGNOSIS — M25569 Pain in unspecified knee: Secondary | ICD-10-CM | POA: Diagnosis not present

## 2020-04-18 DIAGNOSIS — Z87891 Personal history of nicotine dependence: Secondary | ICD-10-CM | POA: Diagnosis not present

## 2020-04-18 DIAGNOSIS — I110 Hypertensive heart disease with heart failure: Secondary | ICD-10-CM | POA: Diagnosis not present

## 2020-04-18 DIAGNOSIS — Z8249 Family history of ischemic heart disease and other diseases of the circulatory system: Secondary | ICD-10-CM | POA: Diagnosis not present

## 2020-04-18 DIAGNOSIS — I82402 Acute embolism and thrombosis of unspecified deep veins of left lower extremity: Secondary | ICD-10-CM | POA: Insufficient documentation

## 2020-04-18 DIAGNOSIS — R5383 Other fatigue: Secondary | ICD-10-CM | POA: Insufficient documentation

## 2020-04-18 DIAGNOSIS — Z86711 Personal history of pulmonary embolism: Secondary | ICD-10-CM

## 2020-04-18 DIAGNOSIS — M109 Gout, unspecified: Secondary | ICD-10-CM | POA: Diagnosis not present

## 2020-04-18 NOTE — Patient Instructions (Signed)
It was great to see you today! No medication changes are needed at this time.  You have been referred to [facility] Card[facility] -they will be in contact to arrange orientation  Your physician recommends that you schedule a follow-up appointment in: 8 weeks with Dr Shirlee Latch and echo  Your physician has requested that you have an echocardiogram. Echocardiography is a painless test that uses sound waves to create images of your heart. It provides your doctor with information about the size and shape of your heart and how well your heart's chambers and valves are working. This procedure takes approximately one hour. There are no restrictions for this procedure.  Do the following things EVERYDAY: 1) Weigh yourself in the morning before breakfast. Write it down and keep it in a log. 2) Take your medicines as prescribed 3) Eat low salt foods--Limit salt (sodium) to 2000 mg per day.  4) Stay as active as you can everyday 5) Limit all fluids for the day to less than 2 liters  At the [facility], you and your health needs are our priority. As part of our continuing mission to provide you with exceptional heart care, we have created designated Provider Care Teams. These Care Teams include your primary Cardiologist (physician) and Advanced Practice Providers (APPs- Physician Assistants and Nurse Practitioners) who all work together to provide you with the care you need, when you need it.   You may see any of the following providers on your designated Care Team at your next follow up: Marland Kitchen Dr Arvilla Meres . Dr [name] . Dr Thornell Mule . Tonye Becket, NP . Robbie Lis, PA . Shanda Bumps [place],NP . Karle Plumber, PharmD   Please be sure to bring in all your medications bottles to every appointment.     If you have any questions or concerns before your next appointment please send [place] a message through [place] or call our office at [phone].    TO LEAVE A MESSAGE FOR  THE NURSE SELECT OPTION 2, PLEASE LEAVE A MESSAGE INCLUDING: . YOUR NAME . DATE OF BIRTH . CALL BACK NUMBER . REASON FOR CALL**this is important as we prioritize the call backs  YOU WILL RECEIVE A CALL BACK THE SAME DAY AS LONG AS YOU CALL BEFORE 4:00 PM

## 2020-04-18 NOTE — Addendum Note (Signed)
Encounter addended by: Theresia Bough, CMA on: 04/18/2020 4:32 PM  Actions taken: Order list changed

## 2020-04-19 ENCOUNTER — Ambulatory Visit: Payer: Federal, State, Local not specified - PPO | Admitting: Medical

## 2020-04-20 ENCOUNTER — Telehealth (HOSPITAL_COMMUNITY): Payer: Self-pay | Admitting: Cardiology

## 2020-04-20 NOTE — Telephone Encounter (Signed)
Pt left voicemail on triage line with a request for detailed note for work  Attempted to contact patient for details No answer unable to leave voicemail

## 2020-04-26 ENCOUNTER — Encounter (HOSPITAL_COMMUNITY): Payer: Self-pay | Admitting: *Deleted

## 2020-04-26 ENCOUNTER — Telehealth (HOSPITAL_COMMUNITY): Payer: Self-pay | Admitting: *Deleted

## 2020-04-26 NOTE — Telephone Encounter (Signed)
pts wife left VM stating she called last week to get a note for pt for work. I called [phone] to get more information no answer unable to leave vm. Also called wife Desiree at [phone] no answer left detailed VM.

## 2020-04-26 NOTE — Progress Notes (Signed)
Received referral from Dr. Shirlee Latch for this pt to participate in Cardiac Rehab with the diagnosis of Chronic Systolic Heart FailureClinical review of pt follow up appt on 4/4 with Tonye Becket NP - Heart Failure office note. Pt is making the expected progress in recovery post hospitalization.  Also reviewed follow up notes with Dr. Servando Salina and pt PCP.  Pt appropriate for scheduling for on site cardiac rehab and/or enrollment in Virtual Cardiac Rehab.  Pt Covid Risk Score is 4.  Will forward to staff for follow up initial call to review insurance benefits and advise where he is on the wait list for scheduling. Alanson Aly, BSN Cardiac and Emergency planning/management officer

## 2020-04-27 ENCOUNTER — Encounter (HOSPITAL_COMMUNITY): Payer: Self-pay | Admitting: *Deleted

## 2020-04-28 ENCOUNTER — Other Ambulatory Visit: Payer: Self-pay

## 2020-04-28 ENCOUNTER — Telehealth: Payer: Self-pay | Admitting: Medical

## 2020-04-28 ENCOUNTER — Ambulatory Visit: Payer: Federal, State, Local not specified - PPO | Admitting: Medical

## 2020-04-28 VITALS — BP 117/53 | HR 65 | Resp 20 | Ht 70.0 in | Wt 370.0 lb

## 2020-04-28 DIAGNOSIS — R739 Hyperglycemia, unspecified: Secondary | ICD-10-CM | POA: Diagnosis not present

## 2020-04-28 DIAGNOSIS — I5022 Chronic systolic (congestive) heart failure: Secondary | ICD-10-CM

## 2020-04-28 DIAGNOSIS — Z86718 Personal history of other venous thrombosis and embolism: Secondary | ICD-10-CM

## 2020-04-28 DIAGNOSIS — I10 Essential (primary) hypertension: Secondary | ICD-10-CM

## 2020-04-28 DIAGNOSIS — E039 Hypothyroidism, unspecified: Secondary | ICD-10-CM | POA: Diagnosis not present

## 2020-04-28 LAB — TSH: TSH: 25.48 u[IU]/mL — ABNORMAL HIGH (ref 0.35–4.50)

## 2020-04-28 LAB — HEMOGLOBIN A1C: Hgb A1c MFr Bld: 6.5 % (ref 4.6–6.5)

## 2020-04-28 LAB — T4, FREE: Free T4: 0.52 ng/dL — ABNORMAL LOW (ref 0.60–1.60)

## 2020-04-28 MED ORDER — ALLOPURINOL 100 MG PO TABS: ORAL_TABLET | ORAL | 6 refills | Status: DC

## 2020-04-28 NOTE — Patient Instructions (Addendum)
Your blood pressure is well controlled today. Continue current bp meds.  Hx of chf. Continue current meds for chf and keep appointment with Dr. Servando Salina cardiologist next week.  For elevated sugar in past get a1c today. Continue farxiga rx'd by Dr. Servando Salina.  For hypothyroid recheck tsh and t4 today.  For gout refilled allopurinol today.  For hx of dvt and pe continue eliquis.  Follow up date to be determined after lab review.

## 2020-04-28 NOTE — Telephone Encounter (Signed)
On last visit pt  mentioned a letter to excuse him from missing work. Not sure if he meant fmla forms? He mentioned this while discussing various issues and accidentally overlooked this by end of interview/exam. Will you call pt and clarify that request/type of letter. How many days does he need to be off? Can he ask for fmla forms?

## 2020-04-28 NOTE — Progress Notes (Signed)
Subjective:    Patient ID: Drew [name], male    DOB: Jan 16, 1960, 60 y.o.   MRN: [id number]  HPI  Pt in for follow up.  Pt since last visit has seen cardiologist. Dr. Servando Salina A/P below.  "He tells me shortness of breath has improved.  He also appears to be euvolemic.  Today I am going to increase his Entresto to 49-51 mg daily as he can tolerate this.  I am hoping with his beta-blocker and his Aldactone it can help with his cardiomyopathy.  With his diagnosis of typical atrial flutter he is now on Eliquis, carvedilol and digoxin.  This may be contributing to his depressed ejection fraction.  He will may to see our EP colleagues as he may benefit from flutter ablation.  I plan to place a monitor the patient to assess if he is is experiencing recurrence of atrial flutter.  The patient understands the need to lose weight with diet and exercise. We have discussed specific strategies for this.  Blood pressure is acceptable, continue to monitor blood pressure with the increased dose of Entresto.  The patient is in agreement with the above plan. The patient left the office in stable condition.  The patient will follow up in 4 weeks or sooner if needed."  Pt has follow up next week with Dr. Servando Salina.  Pt has also seen hematologist since last visit. That was to discuss DVT and PE history.   "He is doing well on Eliquis and his symptoms have improved.  We will have him continue his same regimen and plan to see him again in 2 months. At that time, we will repeat a CT angio and US of the left lower extremity to re-assess his response to anticoagulation."  [place] lower ext and ct angio chest pending.  Pt is also doing at home study for sleep apnea tonight.  Last visit with me thyroid studies done. Labs indicated hypothyroidism. Low dose levothyroxine started 5 weeks ago.       Review of Systems  Constitutional: Negative for chills, fatigue and fever.  Respiratory: Negative for chest  tightness, shortness of breath and wheezing.   Cardiovascular: Negative for chest pain and palpitations.  Gastrointestinal: Negative for abdominal pain, constipation and diarrhea.  Genitourinary: Negative for dysuria.  Musculoskeletal: Negative for back pain, joint swelling and neck pain.  Skin: Negative for rash.  Neurological: Negative for dizziness, light-headedness, numbness and headaches.  Hematological: Negative for adenopathy. Does not bruise/bleed easily.  Psychiatric/Behavioral: Negative for behavioral problems, confusion, dysphoric mood and suicidal ideas. The patient is not nervous/anxious.     Past Medical History:  Diagnosis Date  . AKI (acute kidney injury) (HCC) 03/03/2020  . Allergy   . Dilated cardiomyopathy (HCC)   . HTN (hypertension)   . Hyperlipidemia 05/24/2018     Social History   Socioeconomic History  . Marital status: Married    Spouse name: Not on file  . Number of children: Not on file  . Years of education: Not on file  . Highest education level: Not on file  Occupational History  . Not on file  Tobacco Use  . Smoking status: Former Smoker    Packs/day: 1.00    Years: 20.00    Pack years: 20.00    Types: Cigarettes    Quit date: 05/23/2018    Years since quitting: 1.9  . Smokeless tobacco: Never Used  Vaping Use  . Vaping Use: Never used  Substance and Sexual Activity  .  Alcohol use: Yes    Alcohol/week: 0.0 standard drinks    Comment: 6 pack a week spread out over weekend  . Drug use: No  . Sexual activity: Yes  Other Topics Concern  . Not on file  Social History Narrative   [employer].   Married lives with wife, mother in law and daughter.  7 children.     Social Determinants of Health   Financial Resource Strain: Not on file  Food Insecurity: Not on file  Transportation Needs: Not on file  Physical Activity: Not on file  Stress: Not on file  Social Connections: Not on file  Intimate Partner Violence: Not on file    Past Surgical  History:  Procedure Laterality Date  . CARDIOVERSION N/A 03/07/2020   Procedure: CARDIOVERSION;  Surgeon: Laurey Morale, MD;  Location: [facility] ENDOSCOPY;  Service: Cardiovascular;  Laterality: N/A;  . KNEE ARTHROSCOPY  2009  . RIGHT/LEFT HEART CATH AND CORONARY ANGIOGRAPHY N/A 05/26/2018   Procedure: RIGHT/LEFT HEART CATH AND CORONARY ANGIOGRAPHY;  Surgeon: Lyn Records, MD;  Location: [facility] INVASIVE CV LAB;  Service: Cardiovascular;  Laterality: N/A;  . TEE WITHOUT CARDIOVERSION N/A 03/07/2020   Procedure: TRANSESOPHAGEAL ECHOCARDIOGRAM (TEE);  Surgeon: Laurey Morale, MD;  Location: [facility] ENDOSCOPY;  Service: Cardiovascular;  Laterality: N/A;  . THROAT SURGERY  2005    Family History  Problem Relation Age of Onset  . Hearing loss Mother   . Cancer Father   . Heart failure Other   . Heart failure Paternal Grandfather     Allergies  Allergen Reactions  . Tetracyclines & Related Hives and Itching    Current Outpatient Medications on File Prior to Visit  Medication Sig Dispense Refill  . albuterol (PROVENTIL HFA;VENTOLIN HFA) 108 (90 Base) MCG/ACT inhaler Inhale 2 puffs into the lungs every 6 (six) hours as needed for wheezing or shortness of breath. 1 Inhaler 2  . allopurinol (ZYLOPRIM) 100 MG tablet TAKE 1 TABLET(100 MG) BY MOUTH DAILY (Patient taking differently: Take 100 mg by mouth daily.) 30 tablet 6  . apixaban (ELIQUIS) 5 MG TABS tablet Take 1 tablet (5 mg total) by mouth 2 (two) times daily. 180 tablet 3  . carvedilol (COREG) 3.125 MG tablet Take 1 tablet (3.125 mg total) by mouth 2 (two) times daily with a meal. 60 tablet 2  . dapagliflozin propanediol (FARXIGA) 10 MG TABS tablet Take 1 tablet (10 mg total) by mouth daily. 30 tablet 2  . digoxin (LANOXIN) 0.125 MG tablet Take 1 tablet (0.125 mg total) by mouth daily. 30 tablet 2  . levothyroxine (SYNTHROID) 25 MCG tablet Take 1 tablet (25 mcg total) by mouth daily before breakfast. 30 tablet 11  . sacubitril-valsartan (ENTRESTO)  49-51 MG Take 1 tablet by mouth 2 (two) times daily. 180 tablet 3  . spironolactone (ALDACTONE) 25 MG tablet Take 1 tablet (25 mg total) by mouth daily. 30 tablet 1  . torsemide (DEMADEX) 20 MG tablet TAKE 4 TABLETS (80 MG) BY MOUTH DAILY. (Patient taking differently: Take 80 mg by mouth daily. 1 additional tablet as needed in the evening) 120 tablet 2   No current facility-administered medications on file prior to visit.    BP (!) 117/53   Pulse 65   Resp 20   Ht 5\' 10"  (1.778 m)   Wt (!) 370 lb (167.8 kg)   SpO2 96%   BMI 53.09 kg/m       Objective:   Physical Exam   General Mental Status- Alert.  General Appearance- Not in acute distress.   Skin General: Color- Normal Color. Moisture- Normal Moisture.  Neck Carotid Arteries- Normal color. Moisture- Normal Moisture. No carotid bruits. No JVD.  Chest and Lung Exam Auscultation: Breath Sounds:-Normal.  Cardiovascular Auscultation:Rythm- Regular. Murmurs & Other Heart Sounds:Auscultation of the heart reveals- No Murmurs.  Abdomen Inspection:-Inspeection Normal. Palpation/Percussion:Note:No mass. Palpation and Percussion of the abdomen reveal- Non Tender, Non Distended + BS, no rebound or guarding.   Neurologic Cranial Nerve exam:- CN III-XII intact(No nystagmus), symmetric smile. Strength:- 5/5 equal and symmetric strength both upper and lower extremities.  Lower ext- symmetric, 1+ pedal edema. Negative homans signs.     Assessment & Plan:  Your blood pressure is well controlled today. Continue current bp meds.  Hx of chf. Continue current meds for chf and keep appointment with Dr. Servando Salina cardiologist next week.  For elevated sugar in past get a1c today. Continue farxiga rx'd by Dr. Servando Salina.  For hypothyroid recheck tsh and t4 today.  For gout refilled allopurinol today.  For hx of dvt and pe continue eliquis.  Follow up date to be determined after lab review.  Pt also mentioned a letter to excuse him from  missing work. Not sure if he meant fmla forms? He mentioned this while discussing various issues and accidentally overlooked this by end of interview/exam. Will ask staff to  Call pt and clarify that request/type of letter.  Esperanza Richters, PA-C   Time spent with patient today was  which consisted of chart revdiew, discussing diagnosis, work up treatment and documentation.

## 2020-05-02 NOTE — Telephone Encounter (Signed)
Cardio wrote patient out on 04/27/2020

## 2020-05-04 ENCOUNTER — Telehealth: Payer: Self-pay

## 2020-05-04 DIAGNOSIS — I5043 Acute on chronic combined systolic (congestive) and diastolic (congestive) heart failure: Secondary | ICD-10-CM

## 2020-05-04 NOTE — Telephone Encounter (Signed)
Call to patient who states he plans to take Sleep study test tonight.  Requested I contacted his wife to obtain Serial number.   Spoke with patient's wife (DPR) on file. Serial number obtained, PIN verified, instructions reviewed with patient and he verbalized understanding. Per [employer] no preauthorization required for [id number].

## 2020-05-06 ENCOUNTER — Telehealth: Payer: Federal, State, Local not specified - PPO | Admitting: Cardiology

## 2020-05-06 ENCOUNTER — Telehealth (INDEPENDENT_AMBULATORY_CARE_PROVIDER_SITE_OTHER): Payer: Federal, State, Local not specified - PPO | Admitting: Cardiology

## 2020-05-06 ENCOUNTER — Encounter: Payer: Self-pay | Admitting: Cardiology

## 2020-05-06 VITALS — BP 133/58 | HR 63 | Ht 70.5 in | Wt 363.0 lb

## 2020-05-06 DIAGNOSIS — E782 Mixed hyperlipidemia: Secondary | ICD-10-CM

## 2020-05-06 DIAGNOSIS — I2699 Other pulmonary embolism without acute cor pulmonale: Secondary | ICD-10-CM

## 2020-05-06 DIAGNOSIS — I483 Typical atrial flutter: Secondary | ICD-10-CM

## 2020-05-06 DIAGNOSIS — I5020 Unspecified systolic (congestive) heart failure: Secondary | ICD-10-CM | POA: Diagnosis not present

## 2020-05-06 DIAGNOSIS — I10 Essential (primary) hypertension: Secondary | ICD-10-CM | POA: Diagnosis not present

## 2020-05-06 DIAGNOSIS — I428 Other cardiomyopathies: Secondary | ICD-10-CM | POA: Diagnosis not present

## 2020-05-06 DIAGNOSIS — I420 Dilated cardiomyopathy: Secondary | ICD-10-CM

## 2020-05-06 MED ORDER — DIGOXIN 125 MCG PO TABS: 0.1250 mg | ORAL_TABLET | Freq: Every day | ORAL | 3 refills | Status: DC

## 2020-05-06 NOTE — Progress Notes (Addendum)
Virtual visit via video note.  I connected with Drew [name], on 05/06/20 by a video enabled telemedicine application and verified that I am speaking with the correct person using two identifiers.  Date:  05/06/2020   ID:  Drew [name], DOB December 02, 1960, MRN [id number]  The patient is at home. I am in the office.   PCP:  Esperanza Richters, PA-C  Cardiologist:  Thomasene Ripple, DO  Electrophysiologist:  None   Evaluation Performed: Follow-up visit  Chief Complaint: I am feeling a lot better  History of Present Illness:    Drew [name] is a 60 y.o. male with  hx of nonischemic cardiomyopathy EF on echo 20 to 25% in March 2021,heart failure with reduced ejection fraction, tobacco use, hyperlipidemia, pulmonary embolism recently diagnosed, atrial flutter now on Eliquis and digoxin and hypertension.  I saw the patient on February 05, 2020 at that time we continued his medication regimen which included Coreg 12.5 mg twice daily, Aldactone 12.5 mg daily, Entresto 24-26 mg twice daily and refill his [place]. His diuretics were also continued. We have patient get an echocardiogram.  I saw the patient on March 03, 2020 at that time he was experiencing significant shortness of breath hypoxia and tachycardia was concerned and took the patient to the emergency department to rule out pulmonary embolism.  Indeed did have pulmonary embolism which was seen on his CT of the chest.  He was hospitalized and started on Eliquis.   While in the hospital patient did undergo TEE cardioversion as he did experience atrial flutter.   At his visit in I increase his Entresto to 49/51 mg twice daily.  Since I last saw the patient he was seen at the heart failure clinic.  During that visit he was advised that the patient proceed with getting a sleep study. His medications were continue and patient was educated on the uncertainity of his cause of cardiomyopathy but various possible cause was discussed with plan for a  fu echo and 4 weeks follow up.  Today he is here for a follow up visit.   The patient does not have symptoms concerning for COVID-19 infection.   Past Medical History:  Diagnosis Date  . Acute CHF (congestive heart failure) (HCC) 05/24/2018  . Acute on chronic combined systolic and diastolic CHF (congestive heart failure) (HCC)   . Acute systolic HF (heart failure) (HCC) 06/02/2018  . AKI (acute kidney injury) (HCC) 03/03/2020  . Alcohol use 05/24/2018  . Allergy   . Chronic systolic CHF (congestive heart failure) (HCC) 03/03/2020  . Dilated cardiomyopathy (HCC)   . Essential hypertension 05/24/2018  . HFrEF (heart failure with reduced ejection fraction) (HCC) 11/20/2019  . HTN (hypertension)   . Hyperlipidemia 05/24/2018  . Hypokalemia 03/03/2020  . Knee pain, left 02/11/2014  . New onset of congestive heart failure (HCC) 05/23/2018  . Nonischemic cardiomyopathy (HCC) 11/20/2019  . Obesity, Class III, BMI 40-49.9 (morbid obesity) (HCC) 05/24/2018  . Pulmonary embolism (HCC) 03/03/2020  . Seasonal allergies 02/11/2014  . Tobacco abuse 05/24/2018  . Typical atrial flutter (HCC)   . Wellness examination 02/26/2014   Past Surgical History:  Procedure Laterality Date  . CARDIOVERSION N/A 03/07/2020   Procedure: CARDIOVERSION;  Surgeon: Laurey Morale, MD;  Location: [facility] ENDOSCOPY;  Service: Cardiovascular;  Laterality: N/A;  . KNEE ARTHROSCOPY  2009  . RIGHT/LEFT HEART CATH AND CORONARY ANGIOGRAPHY N/A 05/26/2018   Procedure: RIGHT/LEFT HEART CATH AND CORONARY ANGIOGRAPHY;  Surgeon: Lyn Records, MD;  Location:  [facility] INVASIVE CV LAB;  Service: Cardiovascular;  Laterality: N/A;  . TEE WITHOUT CARDIOVERSION N/A 03/07/2020   Procedure: TRANSESOPHAGEAL ECHOCARDIOGRAM (TEE);  Surgeon: Laurey Morale, MD;  Location: [facility] ENDOSCOPY;  Service: Cardiovascular;  Laterality: N/A;  . THROAT SURGERY  2005     Current Meds  Medication Sig  . albuterol (PROVENTIL HFA;VENTOLIN HFA) 108 (90 Base) MCG/ACT inhaler  Inhale 2 puffs into the lungs every 6 (six) hours as needed for wheezing or shortness of breath.  . allopurinol (ZYLOPRIM) 100 MG tablet TAKE 1 TABLET(100 MG) BY MOUTH DAILY  . apixaban (ELIQUIS) 5 MG TABS tablet Take 1 tablet (5 mg total) by mouth 2 (two) times daily.  . carvedilol (COREG) 3.125 MG tablet Take 1 tablet (3.125 mg total) by mouth 2 (two) times daily with a meal.  . dapagliflozin propanediol (FARXIGA) 10 MG TABS tablet Take 1 tablet (10 mg total) by mouth daily.  . digoxin (LANOXIN) 0.125 MG tablet Take 1 tablet (0.125 mg total) by mouth daily.  Marland Kitchen levothyroxine (SYNTHROID) 25 MCG tablet Take 1 tablet (25 mcg total) by mouth daily before breakfast.  . sacubitril-valsartan (ENTRESTO) 49-51 MG Take 1 tablet by mouth 2 (two) times daily.  Marland Kitchen spironolactone (ALDACTONE) 25 MG tablet Take 1 tablet (25 mg total) by mouth daily.  Marland Kitchen torsemide (DEMADEX) 20 MG tablet TAKE 4 TABLETS (80 MG) BY MOUTH DAILY. (Patient taking differently: Take 80 mg by mouth daily. 1 additional tablet as needed in the evening)     Allergies:   Tetracyclines & related   Social History   Tobacco Use  . Smoking status: Former Smoker    Packs/day: 1.00    Years: 20.00    Pack years: 20.00    Types: Cigarettes    Quit date: 05/23/2018    Years since quitting: 1.9  . Smokeless tobacco: Never Used  Vaping Use  . Vaping Use: Never used  Substance Use Topics  . Alcohol use: Yes    Alcohol/week: 0.0 standard drinks    Comment: 6 pack a week spread out over weekend  . Drug use: No     Family Hx: The patient's family history includes Cancer in his father; Hearing loss in his mother; Heart failure in his paternal grandfather and another family member.  ROS:   Please see the history of present illness.    Review of Systems  Constitution: Negative for decreased appetite, fever and weight gain.  HENT: Negative for congestion, ear discharge, hoarse voice and sore throat.   Eyes: Negative for discharge, redness,  vision loss in right eye and visual halos.  Cardiovascular: Negative for chest pain, dyspnea on exertion, leg swelling, orthopnea and palpitations.  Respiratory: Negative for cough, hemoptysis, shortness of breath and snoring.   Endocrine: Negative for heat intolerance and polyphagia.  Hematologic/Lymphatic: Negative for bleeding problem. Does not bruise/bleed easily.  Skin: Negative for flushing, nail changes, rash and suspicious lesions.  Musculoskeletal: Negative for arthritis, joint pain, muscle cramps, myalgias, neck pain and stiffness.  Gastrointestinal: Negative for abdominal pain, bowel incontinence, diarrhea and excessive appetite.  Genitourinary: Negative for decreased libido, genital sores and incomplete emptying.  Neurological: Negative for brief paralysis, focal weakness, headaches and loss of balance.  Psychiatric/Behavioral: Negative for altered mental status, depression and suicidal ideas.  Allergic/Immunologic: Negative for HIV exposure and persistent infections.    Prior CV studies:   The following studies were reviewed today:  Cardiac MR 03/08/20 FINDINGS: Limited images of the lung fields showed atelectasis at the  left base. Trivial pleural effusions.  Moderately dilated left ventricle with normal wall thickness. Diffuse hypokinesis with EF 42%. Moderately dilated right ventricle with EF 42%, mildly decreased systolic function. Mildly dilated left atrium, normal right atrial size. Trileaflet aortic valve with no significant stenosis or regurgitation. Trivial mitral regurgitation.  On delayed enhancement imaging, there was subtle mid-wall late gadolinium enhancement (LGE) in the mid inferoseptal wall the the RV insertion site.  Measurements:  LVEDV 301 mL  LVSV 126 mL LVEF 42%  RVEDV 324 mL RVSV 136 mL RVEF 42%  IMPRESSION: 1.  Moderately dilated left ventricle with EF 42%.  2.  Moderately dilated right ventricle with EF also 42%.  3. Subtle  mid inferoseptal wall LGE at the RV insertion site. This is nonspecific and suggestive of elevated filling pressures.  Transesophageal echocardiogram March 07, 2020 IMPRESSIONS  1. Left ventricular ejection fraction, by estimation, is 20 to 25%. The  left ventricle has normal function. The left ventricle demonstrates global  hypokinesis. The left ventricular internal cavity size was moderately  dilated.  2. Right ventricular systolic function is moderately reduced. The right  ventricular size is mildly enlarged.  3. Left atrial size was moderately dilated. No left atrial/left atrial  appendage thrombus was detected.  4. Right atrial size was mildly dilated.  5. The mitral valve is normal in structure. Trivial mitral valve  regurgitation. No evidence of mitral stenosis.  6. The aortic valve is tricuspid. Aortic valve regurgitation is not  visualized. No aortic stenosis is present.  7. Normal caliber thoracic aorta with minimal plaque.  8. No PFO or ASD by color doppler.   Labs/Other Tests and Data Reviewed:    EKG: None today  Recent Labs: 03/04/2020: B Natriuretic Peptide 27.0 03/16/2020: Magnesium 2.4; Pro B Natriuretic peptide (BNP) 11.0 04/12/2020: ALT 16; BUN 28; Creatinine 1.31; Hemoglobin 12.5; Platelet Count 254; Potassium 4.0; Sodium 136 04/28/2020: TSH 25.48   Recent Lipid Panel Lab Results  Component Value Date/Time   CHOL 152 05/25/2018 03:43 AM   TRIG 69 05/25/2018 03:43 AM   HDL 36 (L) 05/25/2018 03:43 AM   CHOLHDL 4.2 05/25/2018 03:43 AM   LDLCALC 102 (H) 05/25/2018 03:43 AM    Wt Readings from Last 3 Encounters:  05/06/20 (!) 363 lb (164.7 kg)  04/28/20 (!) 370 lb (167.8 kg)  04/18/20 (!) 373 lb 9.6 oz (169.5 kg)     Objective:    Vital Signs:  BP (!) 133/58   Pulse 63   Ht 5' 10.5" (1.791 m)   Wt (!) 363 lb (164.7 kg)   BMI 51.35 kg/m     ASSESSMENT & PLAN:    1. Essential hypertension 2. Nonischemic cardiomyopathy 3. Heart failure  with reduced ejection fraction 4. Typical atrial flutter 5. Hyperlipidemia 6. Morbid obesity 7. Hyperlipidemia  His blood pressure is acceptable today based on his home blood pressure readings.  Since he was seen the heart failure clinic he has lost some weight - 363 today was 373. I encourage the patient to continue on his current diuretic dose.   Hyperlipidemia-continue patient on his current statin medication. Continue the patient on his rate control agents along with his apixaban for his atrial flutter The patient understands the need to lose weight with diet and exercise. We have discussed specific strategies for this.  COVID-19 Education: The signs and symptoms of COVID-19 were discussed with the patient and how to seek care for testing (follow up with PCP or arrange E-visit).  The importance  of social distancing was discussed today.  Time:   Today, I have spent  10 minutes with the patient with telehealth technology discussing the above problems.     Medication Adjustments/Labs and Tests Ordered: Current medicines are reviewed at length with the patient today.  Concerns regarding medicines are outlined above.   Tests Ordered: No orders of the defined types were placed in this encounter.   Medication Changes: No orders of the defined types were placed in this encounter.   Follow Up: 6 months   Signed, Thomasene Ripple, DO  05/06/2020 10:58 AM    [facility]

## 2020-05-06 NOTE — Patient Instructions (Signed)
Medication Instructions:  Your physician recommends that you continue on your current medications as directed. Please refer to the Current Medication list given to you today. Refill for Digoxin sent into pharmacy.  *If you need a refill on your cardiac medications before your next appointment, please call your pharmacy*   Lab Work: None If you have labs (blood work) drawn today and your tests are completely normal, you will receive your results only by: Marland Kitchen MyChart Message (if you have MyChart) OR . A paper copy in the mail If you have any lab test that is abnormal or we need to change your treatment, we will call you to review the results.   Testing/Procedures: None   Follow-Up: At [facility], you and your health needs are our priority.  As part of our continuing mission to provide you with exceptional heart care, we have created designated Provider Care Teams.  These Care Teams include your primary Cardiologist (physician) and Advanced Practice Providers (APPs -  Physician Assistants and Nurse Practitioners) who all work together to provide you with the care you need, when you need it.  We recommend signing up for the patient portal called "MyChart".  Sign up information is provided on this After Visit Summary.  MyChart is used to connect with patients for Virtual Visits (Telemedicine).  Patients are able to view lab/test results, encounter notes, upcoming appointments, etc.  Non-urgent messages can be sent to your provider as well.   To learn more about what you can do with MyChart, go to [email or web address].    Your next appointment:   6 month(s)  The format for your next appointment:   In Person  Provider:   Thomasene Ripple, DO   Other Instructions

## 2020-05-23 ENCOUNTER — Telehealth (HOSPITAL_COMMUNITY): Payer: Self-pay | Admitting: Surgery

## 2020-05-23 NOTE — Telephone Encounter (Signed)
I called Drew [name] to let him know that the previously completed home sleep study did not record any data.  He tells me that he completed, received confirmation regarding successful completion and is unsure what may have happened.  He agrees to come by the [facility] to get another device tomorrow AM and redo the test at home.

## 2020-05-26 ENCOUNTER — Telehealth: Payer: Self-pay

## 2020-05-26 NOTE — Telephone Encounter (Signed)
Called pt to r/s his lab, ct and md visit on 06/14/20 to august due to contrast shortage- moving appts out is ok with dr Myna Hidalgo.  Pt can keep the doppler as sch.  Pt does not have a vm set up at this time.  Will try him again    Drew [name]

## 2020-05-31 ENCOUNTER — Telehealth: Payer: Self-pay

## 2020-05-31 ENCOUNTER — Other Ambulatory Visit: Payer: Self-pay | Admitting: Cardiology

## 2020-05-31 NOTE — Telephone Encounter (Signed)
S/w pt regarding 06/14/20 appts and advised of r/s due to contrast shortage.  Ok to move out per Don Perking and Dr e, message sent to imaging to r/s the scan and pt is aware of new appts and to [facility] for scan appt    Darica Goren

## 2020-05-31 NOTE — Telephone Encounter (Signed)
Refills sent to pharmacy. 

## 2020-06-02 ENCOUNTER — Telehealth (HOSPITAL_COMMUNITY): Payer: Self-pay

## 2020-06-02 NOTE — Telephone Encounter (Signed)
Called pt to see if he wanted to participate in the cardiac rehab program, pt stated that he would like to do it, but when I went over his insurance benefits pt stated that he couldn't afford it. I advised pt that if anything changed to give [place] a call back. Pt understood. Closed referral.

## 2020-06-07 ENCOUNTER — Encounter (HOSPITAL_BASED_OUTPATIENT_CLINIC_OR_DEPARTMENT_OTHER): Payer: Federal, State, Local not specified - PPO | Admitting: Cardiology

## 2020-06-07 ENCOUNTER — Other Ambulatory Visit: Payer: Self-pay

## 2020-06-07 ENCOUNTER — Ambulatory Visit (HOSPITAL_COMMUNITY)
Admission: RE | Admit: 2020-06-07 | Discharge: 2020-06-07 | Disposition: A | Discharge status: Home / Self Care | Payer: Federal, State, Local not specified - PPO | Source: Ambulatory Visit | Attending: Cardiology | Admitting: Cardiology

## 2020-06-07 ENCOUNTER — Encounter: Payer: Self-pay | Admitting: Cardiology

## 2020-06-07 ENCOUNTER — Ambulatory Visit (HOSPITAL_BASED_OUTPATIENT_CLINIC_OR_DEPARTMENT_OTHER)
Admission: RE | Admit: 2020-06-07 | Discharge: 2020-06-07 | Disposition: A | Discharge status: Home / Self Care | Payer: Federal, State, Local not specified - PPO | Source: Ambulatory Visit | Attending: Cardiology | Admitting: Cardiology

## 2020-06-07 ENCOUNTER — Encounter (HOSPITAL_COMMUNITY): Payer: Self-pay | Admitting: Cardiology

## 2020-06-07 VITALS — BP 102/60 | HR 60 | Wt 375.0 lb

## 2020-06-07 DIAGNOSIS — E785 Hyperlipidemia, unspecified: Secondary | ICD-10-CM | POA: Insufficient documentation

## 2020-06-07 DIAGNOSIS — I5022 Chronic systolic (congestive) heart failure: Secondary | ICD-10-CM | POA: Diagnosis not present

## 2020-06-07 DIAGNOSIS — Z7901 Long term (current) use of anticoagulants: Secondary | ICD-10-CM | POA: Insufficient documentation

## 2020-06-07 DIAGNOSIS — Z8249 Family history of ischemic heart disease and other diseases of the circulatory system: Secondary | ICD-10-CM | POA: Insufficient documentation

## 2020-06-07 DIAGNOSIS — I428 Other cardiomyopathies: Secondary | ICD-10-CM

## 2020-06-07 DIAGNOSIS — I110 Hypertensive heart disease with heart failure: Secondary | ICD-10-CM | POA: Insufficient documentation

## 2020-06-07 DIAGNOSIS — E039 Hypothyroidism, unspecified: Secondary | ICD-10-CM | POA: Diagnosis not present

## 2020-06-07 DIAGNOSIS — Z86718 Personal history of other venous thrombosis and embolism: Secondary | ICD-10-CM | POA: Insufficient documentation

## 2020-06-07 DIAGNOSIS — Z79899 Other long term (current) drug therapy: Secondary | ICD-10-CM | POA: Insufficient documentation

## 2020-06-07 DIAGNOSIS — Z7984 Long term (current) use of oral hypoglycemic drugs: Secondary | ICD-10-CM | POA: Insufficient documentation

## 2020-06-07 DIAGNOSIS — G4733 Obstructive sleep apnea (adult) (pediatric): Secondary | ICD-10-CM | POA: Insufficient documentation

## 2020-06-07 DIAGNOSIS — I5020 Unspecified systolic (congestive) heart failure: Secondary | ICD-10-CM

## 2020-06-07 DIAGNOSIS — M25561 Pain in right knee: Secondary | ICD-10-CM | POA: Insufficient documentation

## 2020-06-07 DIAGNOSIS — Z6841 Body Mass Index (BMI) 40.0 and over, adult: Secondary | ICD-10-CM | POA: Diagnosis not present

## 2020-06-07 DIAGNOSIS — I483 Typical atrial flutter: Secondary | ICD-10-CM

## 2020-06-07 DIAGNOSIS — M25562 Pain in left knee: Secondary | ICD-10-CM | POA: Insufficient documentation

## 2020-06-07 DIAGNOSIS — Z79890 Hormone replacement therapy (postmenopausal): Secondary | ICD-10-CM | POA: Diagnosis not present

## 2020-06-07 DIAGNOSIS — Z87891 Personal history of nicotine dependence: Secondary | ICD-10-CM | POA: Diagnosis not present

## 2020-06-07 DIAGNOSIS — I5043 Acute on chronic combined systolic (congestive) and diastolic (congestive) heart failure: Secondary | ICD-10-CM | POA: Diagnosis not present

## 2020-06-07 LAB — ECHOCARDIOGRAM COMPLETE
Area-P 1/2: 2.58 cm2
Calc EF: 48.1 %
S' Lateral: 5.1 cm
Single Plane A2C EF: 45.4 %
Single Plane A4C EF: 55.6 %

## 2020-06-07 LAB — CBC
HCT: 36.3 % — ABNORMAL LOW (ref 39.0–52.0)
Hemoglobin: 11.8 g/dL — ABNORMAL LOW (ref 13.0–17.0)
MCH: 30.7 pg (ref 26.0–34.0)
MCHC: 32.5 g/dL (ref 30.0–36.0)
MCV: 94.5 fL (ref 80.0–100.0)
Platelets: 286 10*3/uL (ref 150–400)
RBC: 3.84 MIL/uL — ABNORMAL LOW (ref 4.22–5.81)
RDW: 16.5 % — ABNORMAL HIGH (ref 11.5–15.5)
WBC: 7.5 10*3/uL (ref 4.0–10.5)
nRBC: 0 % (ref 0.0–0.2)

## 2020-06-07 LAB — BASIC METABOLIC PANEL
Anion gap: 10 (ref 5–15)
BUN: 19 mg/dL (ref 6–20)
CO2: 29 mmol/L (ref 22–32)
Calcium: 9.2 mg/dL (ref 8.9–10.3)
Chloride: 101 mmol/L (ref 98–111)
Creatinine, Ser: 1.28 mg/dL — ABNORMAL HIGH (ref 0.61–1.24)
GFR, Estimated: >60 mL/min (ref >60)
Glucose, Bld: 101 mg/dL — ABNORMAL HIGH (ref 70–99)
Potassium: 3.8 mmol/L (ref 3.5–5.1)
Sodium: 140 mmol/L (ref 135–145)

## 2020-06-07 LAB — BRAIN NATRIURETIC PEPTIDE: B Natriuretic Peptide: 8.7 pg/mL (ref 0.0–100.0)

## 2020-06-07 LAB — DIGOXIN LEVEL: Digoxin Level: 0.5 ng/mL — ABNORMAL LOW (ref 0.8–2.0)

## 2020-06-07 MED ORDER — CARVEDILOL 6.25 MG PO TABS: 6.2500 mg | ORAL_TABLET | Freq: Two times a day (BID) | ORAL | 3 refills | Status: DC

## 2020-06-07 NOTE — Progress Notes (Signed)
  Echocardiogram 2D Echocardiogram has been performed.  Drew [name] 06/07/2020, 2:07 PM

## 2020-06-07 NOTE — Patient Instructions (Addendum)
EKG done today.  Labs done today. We will contact you only if your labs are abnormal.  INCREASE Carvedilol to 6.25mg  (1 tablet) by mouth2 times daily.   We will be in contact with you regarding starting the new medication for weight loss.  No other medication changes were made. Please continue all current medications as prescribed.  You have been referred to Nutrition services. They will contact you to schedule an appointment.  You have been referred to [facility]. They will contact you to schedule an appointment.   Your physician recommends that you schedule a follow-up appointment in: 3 months  If you have any questions or concerns before your next appointment please send [place] a message through [place] or call our office at [phone].    TO LEAVE A MESSAGE FOR THE NURSE SELECT OPTION 2, PLEASE LEAVE A MESSAGE INCLUDING: . YOUR NAME . DATE OF BIRTH . CALL BACK NUMBER . REASON FOR CALL**this is important as we prioritize the call backs  YOU WILL RECEIVE A CALL BACK THE SAME DAY AS LONG AS YOU CALL BEFORE 4:00 PM   Do the following things EVERYDAY: 1) Weigh yourself in the morning before breakfast. Write it down and keep it in a log. 2) Take your medicines as prescribed 3) Eat low salt foods--Limit salt (sodium) to 2000 mg per day.  4) Stay as active as you can everyday 5) Limit all fluids for the day to less than 2 liters   At the [facility], you and your health needs are our priority. As part of our continuing mission to provide you with exceptional heart care, we have created designated Provider Care Teams. These Care Teams include your primary Cardiologist (physician) and Advanced Practice Providers (APPs- Physician Assistants and Nurse Practitioners) who all work together to provide you with the care you need, when you need it.   You may see any of the following providers on your designated Care Team at your next follow up: Marland Kitchen Dr Arvilla Meres . Dr [name] . Tonye Becket, NP . Robbie Lis, PA . Karle Plumber, PharmD   Please be sure to bring in all your medications bottles to every appointment.

## 2020-06-08 NOTE — Progress Notes (Signed)
[facility] Note    PCP: Esperanza Richters, PA-C Primary Cardiologist: Dr Servando Salina  HF Cardiologist: Dr. Shirlee Latch  HPI: 60 y.o. w/ h/o HTN, HLD, obesity, severe knee OA, tobacco use and NICM, first diagnosed in 05/2018 when admitted for acute CHF. Echo showed LVEF 20-25%. RV was moderately reduced. LHC showed normal cors.   Had 2D echo on 02/29/20 showing EF 25-30%. RV mild-moderately reduced.   Seen at [facility] (03/03/20) w/ complaints of 4 day h/o increased dyspnea w/ hypoxia and pleuritic chest pain. Noted to be in sinus tach w/ HR in the 130s. Given concern for PE, he was referred to [facility] ED where CT angio confirmed Rt middle and lower lobe PE. No evidence of RV strain. Admitted and started on IV heparin. LE Venous doppler + for Lt DVT.  Echo done (03/04/20) RV moderately enlarged w/ mild-moderately reduced systolic function. Not significantly changed from echo 02/29/20. BNP also normal at 27. He remained hemodynamically stable. Hospitalization c/b atrial flutter, s/p successful TEE with DCCV (03/07/20). He was discharged on GDMT, discharge weight 360 lbs.   Echo was done today and reviewed, EF 35-40%, global hypokinesis, mild RVE with normal function, PASP 31 mmHg.   Today he returns for HF follow up.  Weight is up 2 lbs. Main limitation remains bilateral knee pain. He remains in NSR.  No palpitations.  He is not short of breath walking on flat ground, no chest pain.  Able to cut the grass.  No orthopnea/PND.  He has completed sleep study but still waiting on results.   ECG (personally reviewed): NSR, nonspecific T wave flattening  Labs (3/22): K 4, creatinine 1.3  PMH: 1. HTN 2. Hyperlipidemia 3. Hypothyroidism 4. OA: Knees 5. Prior smoker 6. Venous thromboembolism: Left DVT with PE in 2/22.  7. Atrial flutter: 2/22, s/p TEE-guided DCCV.  8. Chronic systolic CHF: Nonischemic cardiomyopathy.  - Echo (5/20): EF 20-25%.  - LHC/RHC (5/20): No significant CAD. Mean RA  6, PA 41/13, mean PCWP 20, CI 3.21.  - TEE (2/22): EF 20-25%, moderate LV dilation, mild RV enlargement with moderately decreased systolic function.  - Cardiac MRI (2/22): Moderate LV dilation with EF 42%, moderate RV dilation with EF 42%, nonspecific RV insertion site LGE.  - Echo (5/22): EF 35-40%, global hypokinesis, mild RVE with normal function, PASP 31 mmHg.  9. Obesity 10. Gout  ROS: All systems negative except as listed in HPI, PMH and Problem List.  SH:  Social History   Socioeconomic History  . Marital status: Married    Spouse name: Not on file  . Number of children: Not on file  . Years of education: Not on file  . Highest education level: Not on file  Occupational History  . Not on file  Tobacco Use  . Smoking status: Former Smoker    Packs/day: 1.00    Years: 20.00    Pack years: 20.00    Types: Cigarettes    Quit date: 05/23/2018    Years since quitting: 2.0  . Smokeless tobacco: Never Used  Vaping Use  . Vaping Use: Never used  Substance and Sexual Activity  . Alcohol use: Yes    Alcohol/week: 0.0 standard drinks    Comment: 6 pack a week spread out over weekend  . Drug use: No  . Sexual activity: Yes  Other Topics Concern  . Not on file  Social History Narrative   [employer].   Married lives with wife, mother in Social [place] and  daughter.  7 children.     Social Determinants of Health   Financial Resource Strain: Not on file  Food Insecurity: Not on file  Transportation Needs: Not on file  Physical Activity: Not on file  Stress: Not on file  Social Connections: Not on file  Intimate Partner Violence: Not on file   FH:  Family History  Problem Relation Age of Onset  . Hearing loss Mother   . Cancer Father   . Heart failure Other   . Heart failure Paternal Grandfather    Current Outpatient Medications  Medication Sig Dispense Refill  . albuterol (PROVENTIL HFA;VENTOLIN HFA) 108 (90 Base) MCG/ACT inhaler Inhale 2 puffs into the lungs every 6 (six) hours as  needed for wheezing or shortness of breath. 1 Inhaler 2  . allopurinol (ZYLOPRIM) 100 MG tablet TAKE 1 TABLET(100 MG) BY MOUTH DAILY 30 tablet 6  . apixaban (ELIQUIS) 5 MG TABS tablet Take 1 tablet (5 mg total) by mouth 2 (two) times daily. 180 tablet 3  . dapagliflozin propanediol (FARXIGA) 10 MG TABS tablet Take 1 tablet (10 mg total) by mouth daily. 30 tablet 2  . digoxin (LANOXIN) 0.125 MG tablet Take 1 tablet (0.125 mg total) by mouth daily. 90 tablet 3  . levothyroxine (SYNTHROID) 25 MCG tablet Take 1 tablet (25 mcg total) by mouth daily before breakfast. 30 tablet 11  . sacubitril-valsartan (ENTRESTO) 49-51 MG Take 1 tablet by mouth 2 (two) times daily. 180 tablet 3  . spironolactone (ALDACTONE) 25 MG tablet TAKE 1 TABLET(25 MG) BY MOUTH DAILY 30 tablet 5  . torsemide (DEMADEX) 20 MG tablet TAKE 4 TABLETS (80 MG) BY MOUTH DAILY. 120 tablet 2  . carvedilol (COREG) 6.25 MG tablet Take 1 tablet (6.25 mg total) by mouth 2 (two) times daily with a meal. 180 tablet 3   No current facility-administered medications for this encounter.    Vitals:   06/07/20 1420  BP: 102/60  Pulse: 60  SpO2: 98%  Weight: (!) 170.1 kg (375 lb)   Wt Readings from Last 3 Encounters:  06/07/20 (!) 170.1 kg (375 lb)  05/06/20 (!) 164.7 kg (363 lb)  04/28/20 (!) 167.8 kg (370 lb)   PHYSICAL EXAM: General: NAD, obese Neck: Thick. No JVD, no thyromegaly or thyroid nodule.  Lungs: Clear to auscultation bilaterally with normal respiratory effort. CV: Nondisplaced PMI.  Heart regular S1/S2, no S3/S4, no murmur.  No peripheral edema.  No carotid bruit.  Normal pedal pulses.  Abdomen: Soft, nontender, no hepatosplenomegaly, no distention.  Skin: Intact without lesions or rashes.  Neurologic: Alert and oriented x 3.  Psych: Normal affect. Extremities: No clubbing or cyanosis.  HEENT: Normal.   ASSESSMENT & PLAN: 1. PE possibly with infarction and LLE DVT: 2/22, unprovoked. Patient has no FH of VTE. He had  not been sedentary, had been working full time at the [employer]. Echo in 2/22 with moderate RV dilation/moderate dysfunction. Echo today showed mild RV enlargement, normal RV function.  - Continue Eliquis long-term.  2. Atrial flutter: Typical flutter.  This was noted at when he was admitted for PE in 2/22. Suspect this was triggered by PE.  DCCV in 2/22. He is in NSR today, no longer taking amiodarone.  - Continue anticoagulation therapy w/ Eliquis.    - If recurrence, should have atrial flutter ablation.  - Pending sleep study results.  3. Chronic systolic CHF: Diagnosed in 2020. Cath from 5/20 with no significant CAD. Cause of CMP is uncertain, nonischemic  cardiomyopathy.?Viral myocarditis.  02/2020 cMRI LV/RV EF 42% with only nonspecific RV insertion site LGE.  Echo today showed EF 35-40% with mild RVE and normal RV function.  He is not volume overloaded on exam, NYHA class II (mainly limited by knee pain).  - Increase Coreg to 6.25 mg bid.  - Continue spironolactone 25 mg daily. - Continue digoxin 0.125, check level today.  - Continue Entresto 24/26 mg bid, does not have BP room to increase today.  - Continue torsemide 80 mg daily, BMET/BNP today.  - Continue dapagliflozin 10 mg daily.  - EF appears out of range for ICD.  Narrow QRS so not candidate for CRT.  4. Obesity: Body mass index is 53.05 kg/m.  Obesity is a major contributor to his knee pain and may contribute to his cardiomyopathy.  [facility] clinic was going to be too expensive.   - I will refer to the [facility] bariatrics program.  5. OSA: Sleep study done, waiting on report.    [name] 06/08/2020

## 2020-06-14 ENCOUNTER — Ambulatory Visit: Payer: Federal, State, Local not specified - PPO | Admitting: Family

## 2020-06-14 ENCOUNTER — Ambulatory Visit: Payer: Federal, State, Local not specified - PPO | Attending: Internal Medicine

## 2020-06-14 ENCOUNTER — Other Ambulatory Visit: Payer: Self-pay

## 2020-06-14 ENCOUNTER — Ambulatory Visit (HOSPITAL_BASED_OUTPATIENT_CLINIC_OR_DEPARTMENT_OTHER): Admission: RE | Admit: 2020-06-14 | Payer: Federal, State, Local not specified - PPO | Source: Ambulatory Visit

## 2020-06-14 ENCOUNTER — Ambulatory Visit (HOSPITAL_BASED_OUTPATIENT_CLINIC_OR_DEPARTMENT_OTHER)
Admission: RE | Admit: 2020-06-14 | Discharge: 2020-06-14 | Disposition: A | Discharge status: Home / Self Care | Payer: Federal, State, Local not specified - PPO | Source: Ambulatory Visit | Attending: Family | Admitting: Family

## 2020-06-14 ENCOUNTER — Other Ambulatory Visit: Payer: Federal, State, Local not specified - PPO

## 2020-06-14 DIAGNOSIS — I824Y2 Acute embolism and thrombosis of unspecified deep veins of left proximal lower extremity: Secondary | ICD-10-CM

## 2020-06-14 DIAGNOSIS — Z86711 Personal history of pulmonary embolism: Secondary | ICD-10-CM | POA: Diagnosis not present

## 2020-06-14 DIAGNOSIS — I2699 Other pulmonary embolism without acute cor pulmonale: Secondary | ICD-10-CM

## 2020-06-14 DIAGNOSIS — Z23 Encounter for immunization: Secondary | ICD-10-CM

## 2020-06-14 DIAGNOSIS — R600 Localized edema: Secondary | ICD-10-CM | POA: Diagnosis not present

## 2020-06-14 NOTE — Progress Notes (Signed)
   Covid-19 Vaccination Clinic  Name:  Drew [name]    MRN: [id number] DOB: May 05, 1960  06/14/2020  [name] was observed post Covid-19 immunization for 15 minutes without incident. He was provided with Vaccine Information Sheet and instruction to access the V-Safe system.   [name] was instructed to call 911 with any severe reactions post vaccine: Marland Kitchen Difficulty breathing  . Swelling of face and throat  . A fast heartbeat  . A bad rash all over body  . Dizziness and weakness   Immunizations Administered    Name Date Dose VIS Date Route   PFIZER Comrnaty(Gray TOP) Covid-19 Vaccine 06/14/2020  9:33 AM 0.3 mL 12/24/2019 Intramuscular   Manufacturer: [employer], [employer]   Lot: [id number]   NDC: [phone]

## 2020-06-15 ENCOUNTER — Encounter: Payer: Self-pay | Admitting: *Deleted

## 2020-06-15 ENCOUNTER — Telehealth: Payer: Self-pay | Admitting: *Deleted

## 2020-06-15 NOTE — Telephone Encounter (Signed)
Unable to leave message.  Voicemail has not been set up.  Will send MyChart message with results.

## 2020-06-15 NOTE — Telephone Encounter (Signed)
-----   Message from Verdie Mosher, NP sent at 06/15/2020  2:51 PM EDT ----- No evidence of DVT!!!! WOO HOO Eliquis is working!!!! Keep up the good work!!!    ----- Message ----- From: Doctor, hospital, Rad Results In Sent: 06/14/2020   9:30 AM EDT To: Verdie Mosher, NP

## 2020-06-16 ENCOUNTER — Ambulatory Visit: Payer: Federal, State, Local not specified - PPO

## 2020-06-16 DIAGNOSIS — I5043 Acute on chronic combined systolic (congestive) and diastolic (congestive) heart failure: Secondary | ICD-10-CM

## 2020-06-16 NOTE — Procedures (Signed)
Erroneous encounter

## 2020-06-16 NOTE — Procedures (Signed)
   Sleep Study Report  Patient Information Name: Drew [name]  ID: [id number] Birth Date: 1960-07-05  Age: 60  Gender:Male Study Date:06/09/2020 Referring Physician:  [name], MD  TEST DESCRIPTION: Home sleep apnea testing was completed using the WatchPat, a Type 1 device, utilizing  peripheral arterial tonometry (PAT), chest movement, actigraphy, pulse oximetry, pulse rate, body position and snore.  AHI was calculated with apnea and hypopnea using valid sleep time as the denominator. RDI includes apneas,  hypopneas, and RERAs. The data acquired and the scoring of sleep and all associated events were performed in  accordance with the recommended standards and specifications as outlined in the AASM Manual for the Scoring of  Sleep and Associated Events 2.2.0 (2015).  FINDINGS: 1. Severe Sleep Apnea with Ahi 61/hr.  2. No Central Sleep Apnea with pAHIc 2.8/hr. 3. Oxygen desaturations as low as 75%. 4. Mild snoring was present. O2 sats were < 88% for 23.1 min. 5. Total sleep time was 5 hrs and 51 min. 6. 13.5% of total sleep time was spent in REM sleep.  7. Shortened sleep onset latency at 6 min.  8. Normal REM sleep onset latency at 82 min.  9. Total awakenings were 5.   DIAGNOSIS:  Severe Obstructive Sleep Apnea (G47.33)  RECOMMENDATIONS: 1. Clinical correlation of these findings is necessary. The decision to treat obstructive sleep apnea (OSA) is usually  based on the presence of apnea symptoms or the presence of associated medical conditions such as Hypertension,  Congestive Heart Failure, Atrial Fibrillation or Obesity. The most common symptoms of OSA are snoring, gasping for  breath while sleeping, daytime sleepiness and fatigue.   2. Initiating apnea therapy is recommended given the presence of symptoms and/or associated conditions.  Recommend proceeding with one of the following:   a. Auto-CPAP therapy with a pressure range of 5-20cm H2O.   b. An oral  appliance (OA) that can be obtained from certain dentists with expertise in sleep medicine. These are  primarily of use in non-obese patients with mild and moderate disease.   c. An ENT consultation which may be useful to look for specific causes of obstruction and possible treatment  Options.   d. If patient is intolerant to PAP therapy, consider referral to ENT for evaluation for hypoglossal nerve stimulator.   3. Close follow-up is necessary to ensure success with CPAP or oral appliance therapy for maximum benefit .  4. A follow-up oximetry study on CPAP is recommended to assess the adequacy of therapy and determine the need  for supplemental oxygen or the potential need for Bi-level therapy. An arterial blood gas to determine the adequacy of  baseline ventilation and oxygenation should also be considered.  5. Healthy sleep recommendations include: adequate nightly sleep (normal 7-9 hrs/night), avoidance of caffeine after  noon and alcohol near bedtime, and maintaining a sleep environment that is cool, dark and quiet.  6. Weight loss for overweight patients is recommended. Even modest amounts of weight loss can significantly  improve the severity of sleep apnea.  7. Snoring recommendations include: weight loss where appropriate, side sleeping, and avoidance of alcohol before  Bed.  8. Operation of motor vehicle or dangerous equipment must be avoided when feeling drowsy, excessively sleepy, or  mentally fatigued.  Report prepared by: Signature: Armanda Magic, MD; [facility]; Diplomat American Board of Sleep Medicine Electronically Signed: Jun 2, 202

## 2020-06-16 NOTE — Progress Notes (Signed)
This encounter was created in error - please disregard.

## 2020-06-20 ENCOUNTER — Other Ambulatory Visit (HOSPITAL_BASED_OUTPATIENT_CLINIC_OR_DEPARTMENT_OTHER): Payer: Self-pay

## 2020-06-20 MED ORDER — PFIZER-BIONT COVID-19 VAC-TRIS 30 MCG/0.3ML IM SUSP
INTRAMUSCULAR | 0 refills | Status: DC
  Filled 2020-06-20: qty 0.3, 1d supply, fill #0

## 2020-06-28 ENCOUNTER — Other Ambulatory Visit: Payer: Self-pay | Admitting: Cardiology

## 2020-06-28 NOTE — Telephone Encounter (Signed)
Refill sent to pharmacy.   

## 2020-06-29 ENCOUNTER — Telehealth: Payer: Self-pay | Admitting: *Deleted

## 2020-06-29 DIAGNOSIS — G4733 Obstructive sleep apnea (adult) (pediatric): Secondary | ICD-10-CM

## 2020-06-29 NOTE — Telephone Encounter (Signed)
-----   Message from Quintella Reichert, MD sent at 06/16/2020  7:03 PM EDT ----- Please let patient know that they have sleep apnea.  Recommend therapeutic CPAP titration for treatment of patient's sleep disordered breathing.  If unable to perform an in lab titration then initiate ResMed auto CPAP from 4 to 15cm H2O with heated humidity and mask of choice and overnight pulse ox on CPAP.

## 2020-06-29 NOTE — Telephone Encounter (Signed)
The patient has been notified of the result and verbalized understanding.  All questions (if any) were answered. Latrelle Dodrill, CMA 06/29/2020 1:37 PM    CPAP titration sent to sleep pool

## 2020-07-04 ENCOUNTER — Telehealth: Payer: Self-pay | Admitting: *Deleted

## 2020-07-04 NOTE — Telephone Encounter (Signed)
PA request for CPAP titration faxed to [facility] @ [phone]. Pending case # [id number].

## 2020-07-14 NOTE — Telephone Encounter (Signed)
PA received from [employer]. Auth # [id number]. Valid dates 07/15/20 to 01/10/21. Message routed to [facility] for scheduling.

## 2020-07-20 NOTE — Telephone Encounter (Signed)
Patient is scheduled for lab study on 09/25/20. Patient understands his sleep study will be done at [facility] sleep lab. Patient understands he will receive a sleep packet in a week or so. Patient understands to call if he does not receive the sleep packet in a timely manner. Patient agrees with treatment and thanked me for call.

## 2020-08-02 ENCOUNTER — Other Ambulatory Visit: Payer: Self-pay

## 2020-08-02 MED ORDER — ENTRESTO 49-51 MG PO TABS: 1.0000 | ORAL_TABLET | Freq: Two times a day (BID) | ORAL | 2 refills | Status: DC

## 2020-08-02 MED ORDER — ENTRESTO 49-51 MG PO TABS
1.0000 | ORAL_TABLET | Freq: Two times a day (BID) | ORAL | 2 refills | Status: DC
Start: 2020-08-02 — End: 2020-09-07

## 2020-08-02 NOTE — Telephone Encounter (Signed)
Refill of Entresto 49-51 mg sent to [facility]

## 2020-08-03 ENCOUNTER — Telehealth: Payer: Self-pay

## 2020-08-03 NOTE — Telephone Encounter (Signed)
Entresto 49/51 approved from 07/04/20-08/03/2022. No confirmation number is attach to this approval but can be located using the patient Insurance ID number per [place] PA rep. Pharmacy is notified

## 2020-08-15 ENCOUNTER — Ambulatory Visit: Payer: Federal, State, Local not specified - PPO | Admitting: Family

## 2020-08-15 ENCOUNTER — Other Ambulatory Visit: Payer: Federal, State, Local not specified - PPO

## 2020-08-19 ENCOUNTER — Other Ambulatory Visit (HOSPITAL_BASED_OUTPATIENT_CLINIC_OR_DEPARTMENT_OTHER): Payer: Federal, State, Local not specified - PPO

## 2020-08-22 ENCOUNTER — Encounter (HOSPITAL_BASED_OUTPATIENT_CLINIC_OR_DEPARTMENT_OTHER): Payer: Self-pay

## 2020-08-22 ENCOUNTER — Inpatient Hospital Stay: Payer: Federal, State, Local not specified - PPO | Attending: Hematology & Oncology

## 2020-08-22 ENCOUNTER — Ambulatory Visit (HOSPITAL_BASED_OUTPATIENT_CLINIC_OR_DEPARTMENT_OTHER)
Admission: RE | Admit: 2020-08-22 | Discharge: 2020-08-22 | Disposition: A | Discharge status: Home / Self Care | Payer: Federal, State, Local not specified - PPO | Source: Ambulatory Visit | Attending: Family | Admitting: Family

## 2020-08-22 ENCOUNTER — Encounter: Payer: Self-pay | Admitting: Family

## 2020-08-22 ENCOUNTER — Inpatient Hospital Stay (HOSPITAL_BASED_OUTPATIENT_CLINIC_OR_DEPARTMENT_OTHER): Payer: Federal, State, Local not specified - PPO | Admitting: Family

## 2020-08-22 ENCOUNTER — Other Ambulatory Visit: Payer: Self-pay

## 2020-08-22 VITALS — BP 104/58 | HR 95 | Temp 98.4°F | Resp 20 | Ht 70.0 in | Wt 372.0 lb

## 2020-08-22 DIAGNOSIS — I2699 Other pulmonary embolism without acute cor pulmonale: Secondary | ICD-10-CM

## 2020-08-22 DIAGNOSIS — Z86711 Personal history of pulmonary embolism: Secondary | ICD-10-CM | POA: Insufficient documentation

## 2020-08-22 DIAGNOSIS — I824Y2 Acute embolism and thrombosis of unspecified deep veins of left proximal lower extremity: Secondary | ICD-10-CM | POA: Diagnosis not present

## 2020-08-22 DIAGNOSIS — I517 Cardiomegaly: Secondary | ICD-10-CM | POA: Diagnosis not present

## 2020-08-22 DIAGNOSIS — Z86718 Personal history of other venous thrombosis and embolism: Secondary | ICD-10-CM | POA: Diagnosis not present

## 2020-08-22 DIAGNOSIS — D509 Iron deficiency anemia, unspecified: Secondary | ICD-10-CM | POA: Diagnosis not present

## 2020-08-22 DIAGNOSIS — J8410 Pulmonary fibrosis, unspecified: Secondary | ICD-10-CM | POA: Diagnosis not present

## 2020-08-22 DIAGNOSIS — Z7901 Long term (current) use of anticoagulants: Secondary | ICD-10-CM | POA: Diagnosis not present

## 2020-08-22 LAB — CMP (CANCER CENTER ONLY)
ALT: 18 U/L (ref 0–44)
AST: 15 U/L (ref 15–41)
Albumin: 4.1 g/dL (ref 3.5–5.0)
Alkaline Phosphatase: 50 U/L (ref 38–126)
Anion gap: 9 (ref 5–15)
BUN: 18 mg/dL (ref 6–20)
CO2: 27 mmol/L (ref 22–32)
Calcium: 9.3 mg/dL (ref 8.9–10.3)
Chloride: 103 mmol/L (ref 98–111)
Creatinine: 1.13 mg/dL (ref 0.61–1.24)
GFR, Estimated: >60 mL/min (ref >60)
Glucose, Bld: 101 mg/dL — ABNORMAL HIGH (ref 70–99)
Potassium: 3.7 mmol/L (ref 3.5–5.1)
Sodium: 139 mmol/L (ref 135–145)
Total Bilirubin: 0.3 mg/dL (ref 0.3–1.2)
Total Protein: 6.8 g/dL (ref 6.5–8.1)

## 2020-08-22 LAB — CBC WITH DIFFERENTIAL (CANCER CENTER ONLY)
Abs Immature Granulocytes: 0.01 10*3/uL (ref 0.00–0.07)
Basophils Absolute: 0.1 10*3/uL (ref 0.0–0.1)
Basophils Relative: 1 %
Eosinophils Absolute: 0.4 10*3/uL (ref 0.0–0.5)
Eosinophils Relative: 7 %
HCT: 30.3 % — ABNORMAL LOW (ref 39.0–52.0)
Hemoglobin: 9.9 g/dL — ABNORMAL LOW (ref 13.0–17.0)
Immature Granulocytes: 0 %
Lymphocytes Relative: 26 %
Lymphs Abs: 1.7 10*3/uL (ref 0.7–4.0)
MCH: 29.6 pg (ref 26.0–34.0)
MCHC: 32.7 g/dL (ref 30.0–36.0)
MCV: 90.7 fL (ref 80.0–100.0)
Monocytes Absolute: 0.6 10*3/uL (ref 0.1–1.0)
Monocytes Relative: 10 %
Neutro Abs: 3.9 10*3/uL (ref 1.7–7.7)
Neutrophils Relative %: 56 %
Platelet Count: 310 10*3/uL (ref 150–400)
RBC: 3.34 MIL/uL — ABNORMAL LOW (ref 4.22–5.81)
RDW: 14.1 % (ref 11.5–15.5)
WBC Count: 6.7 10*3/uL (ref 4.0–10.5)
nRBC: 0 % (ref 0.0–0.2)

## 2020-08-22 LAB — LACTATE DEHYDROGENASE: LDH: 126 U/L (ref 98–192)

## 2020-08-22 MED ORDER — IOHEXOL 350 MG/ML SOLN
100.0000 mL | Freq: Once | INTRAVENOUS | Status: AC | PRN
  Administered 2020-08-22: 100 mL via INTRAVENOUS

## 2020-08-22 NOTE — Progress Notes (Signed)
Hematology and Oncology Follow Up Visit  Drew [name] [id number] [birth year] 60 y.o. 08/22/2020   Principle Diagnosis:  Right lower and middle lobe PEs with no evidence of right heart strain  Left lower extremity DVT involving the left peroneal veins  Current Therapy:   Eliquis 5 mg PO BID   Interim History:  Drew [name] is here today for follow-up and repeat CT angio. He has had fatigue and states that he is working with his PCP regarding weight loss.  He states that his mobility is effected by the size of his legs. The Demedex does help with reduction in fluid retention.  Pedal pulses are 2+.  He has been walking for exercise and also push mows his yard. He takes a break to rest when needed.  He has some lower back discomfort when standing for an extended period of time. He also has sciatic in his legs when he stretches them out.  No falls or syncope to report.  No fever, chills, n/v, cough, rash, dizziness, chest pain, palpitations, abdominal pain or changes in bowel or bladder habits.  He notes occasional red blood in his stool with straining. He states that this is rare.  No other blood loss noted. No bruising or petechiae.  He is eating a healthier diet and is doing his best to stick to his fluid restriction of 1.5L per day. His weight is 372 lbs.    ECOG Performance Status: 1 - Symptomatic but completely ambulatory  Medications:  Allergies as of 08/22/2020       Reactions   Tetracyclines & Related Hives, Itching        Medication List        Accurate as of August 22, 2020 12:44 PM. If you have any questions, ask your nurse or doctor.          STOP taking these medications    Pfizer-BioNT COVID-19 Vac-TriS Susp injection Generic drug: COVID-19 mRNA Vac-TriS Proofreader) Stopped by: Emeline Gins, NP       TAKE these medications    albuterol 108 (90 Base) MCG/ACT inhaler Commonly known as: VENTOLIN HFA Inhale 2 puffs into the lungs every 6 (six) hours as  needed for wheezing or shortness of breath.   allopurinol 100 MG tablet Commonly known as: ZYLOPRIM TAKE 1 TABLET(100 MG) BY MOUTH DAILY   apixaban 5 MG Tabs tablet Commonly known as: Eliquis Take 1 tablet (5 mg total) by mouth 2 (two) times daily.   carvedilol 6.25 MG tablet Commonly known as: COREG Take 1 tablet (6.25 mg total) by mouth 2 (two) times daily with a meal.   digoxin 0.125 MG tablet Commonly known as: LANOXIN Take 1 tablet (0.125 mg total) by mouth daily.   Entresto 49-51 MG Generic drug: sacubitril-valsartan Take 1 tablet by mouth 2 (two) times daily.   Farxiga 10 MG Tabs tablet Generic drug: dapagliflozin propanediol TAKE 1 TABLET(10 MG) BY MOUTH DAILY   levothyroxine 25 MCG tablet Commonly known as: SYNTHROID Take 1 tablet (25 mcg total) by mouth daily before breakfast.   spironolactone 25 MG tablet Commonly known as: ALDACTONE TAKE 1 TABLET(25 MG) BY MOUTH DAILY   torsemide 20 MG tablet Commonly known as: DEMADEX TAKE 4 TABLETS (80 MG) BY MOUTH DAILY.        Allergies:  Allergies  Allergen Reactions   Tetracyclines & Related Hives and Itching    Past Medical History, Surgical history, Social history, and Family History were reviewed and updated.  Review of Systems:  All other 10 point review of systems is negative.   Physical Exam:  height is 5\' 10"  (1.778 m) and weight is 372 lb (168.7 kg) (abnormal). His oral temperature is 98.4 F (36.9 C). His blood pressure is 104/58 (abnormal) and his pulse is 95. His respiration is 20 and oxygen saturation is 92%.   Wt Readings from Last 3 Encounters:  08/22/20 (!) 372 lb (168.7 kg)  06/07/20 (!) 375 lb (170.1 kg)  05/06/20 (!) 363 lb (164.7 kg)    Ocular: Sclerae unicteric, pupils equal, round and reactive to light Ear-nose-throat: Oropharynx clear, dentition fair Lymphatic: No cervical or supraclavicular adenopathy Lungs no rales or rhonchi, good excursion bilaterally Heart regular rate and  rhythm, no murmur appreciated Abd soft, nontender, positive bowel sounds MSK no focal spinal tenderness, no joint edema Neuro: non-focal, well-oriented, appropriate affect Breasts: Deferred   Lab Results  Component Value Date   WBC 6.7 08/22/2020   HGB 9.9 (L) 08/22/2020   HCT 30.3 (L) 08/22/2020   MCV 90.7 08/22/2020   PLT 310 08/22/2020   No results found for: FERRITIN, IRON, TIBC, UIBC, IRONPCTSAT Lab Results  Component Value Date   RBC 3.34 (L) 08/22/2020   No results found for: KPAFRELGTCHN, LAMBDASER, KAPLAMBRATIO No results found for: IGGSERUM, IGA, IGMSERUM No results found for: 10/22/2020, SPEI   Chemistry      Component Value Date/Time   NA 139 08/22/2020 0920   NA 146 (H) 02/05/2020 1114   K 3.7 08/22/2020 0920   CL 103 08/22/2020 0920   CO2 27 08/22/2020 0920   BUN 18 08/22/2020 0920   BUN 16 02/05/2020 1114   CREATININE 1.13 08/22/2020 0920   CREATININE 1.26 11/13/2019 1454      Component Value Date/Time   CALCIUM 9.3 08/22/2020 0920   ALKPHOS 50 08/22/2020 0920   AST 15 08/22/2020 0920   ALT 18 08/22/2020 0920   BILITOT 0.3 08/22/2020 0920       Impression and Plan: Drew [name] is a very pleasant 60 yo African American gentleman with recent diagnosis of right lower and middle lobe PEs with no evidence of right heart strain and left lower extremity DVT involving the left peroneal veins with atrial flutter (TEE and cardioversion on 03/07/20). Iron studies are pending. We will replace if needed.  He states that cariology prefers he stay on Eliquis for now.  His CT angio today showed resolution of PE's and no recurrence.  We will plan to see him again in 6 months.  He can contact our office with any questions or concerns.   03/09/20, NP 8/8/202212:44 PM

## 2020-08-23 ENCOUNTER — Encounter: Payer: Self-pay | Admitting: Family

## 2020-08-23 ENCOUNTER — Other Ambulatory Visit: Payer: Self-pay | Admitting: Family

## 2020-08-23 DIAGNOSIS — D509 Iron deficiency anemia, unspecified: Secondary | ICD-10-CM

## 2020-08-23 HISTORY — DX: Iron deficiency anemia, unspecified: D50.9

## 2020-08-23 LAB — IRON AND TIBC
Iron: 30 ug/dL — ABNORMAL LOW (ref 42–163)
Saturation Ratios: 8 % — ABNORMAL LOW (ref 20–55)
TIBC: 380 ug/dL (ref 202–409)
UIBC: 350 ug/dL (ref 117–376)

## 2020-08-23 LAB — FERRITIN: Ferritin: 23 ng/mL — ABNORMAL LOW (ref 24–336)

## 2020-08-24 ENCOUNTER — Telehealth: Payer: Self-pay | Admitting: Family

## 2020-08-24 NOTE — Telephone Encounter (Signed)
Called and spoke with patient regarding appointments added per 8/9 sch msg

## 2020-08-26 ENCOUNTER — Inpatient Hospital Stay: Payer: Federal, State, Local not specified - PPO

## 2020-08-26 ENCOUNTER — Other Ambulatory Visit: Payer: Self-pay

## 2020-08-26 VITALS — BP 108/49 | HR 65 | Temp 98.0°F | Resp 18

## 2020-08-26 DIAGNOSIS — Z7901 Long term (current) use of anticoagulants: Secondary | ICD-10-CM | POA: Diagnosis not present

## 2020-08-26 DIAGNOSIS — Z86711 Personal history of pulmonary embolism: Secondary | ICD-10-CM | POA: Diagnosis not present

## 2020-08-26 DIAGNOSIS — D509 Iron deficiency anemia, unspecified: Secondary | ICD-10-CM

## 2020-08-26 DIAGNOSIS — Z86718 Personal history of other venous thrombosis and embolism: Secondary | ICD-10-CM | POA: Diagnosis not present

## 2020-08-26 MED ORDER — SODIUM CHLORIDE 0.9 % IV SOLN
510.0000 mg | Freq: Once | INTRAVENOUS | Status: AC
  Administered 2020-08-26: 510 mg via INTRAVENOUS
  Filled 2020-08-26: qty 510

## 2020-08-26 MED ORDER — SODIUM CHLORIDE 0.9 % IV SOLN
Freq: Once | INTRAVENOUS | Status: AC
  Filled 2020-08-26: qty 250

## 2020-08-26 NOTE — Patient Instructions (Signed)

## 2020-09-07 ENCOUNTER — Encounter (HOSPITAL_COMMUNITY): Payer: Self-pay | Admitting: Cardiology

## 2020-09-07 ENCOUNTER — Other Ambulatory Visit: Payer: Self-pay

## 2020-09-07 ENCOUNTER — Ambulatory Visit (HOSPITAL_COMMUNITY)
Admission: RE | Admit: 2020-09-07 | Discharge: 2020-09-07 | Disposition: A | Discharge status: Home / Self Care | Payer: Federal, State, Local not specified - PPO | Source: Ambulatory Visit | Attending: Cardiology | Admitting: Cardiology

## 2020-09-07 VITALS — BP 118/70 | HR 73 | Wt 382.4 lb

## 2020-09-07 DIAGNOSIS — K625 Hemorrhage of anus and rectum: Secondary | ICD-10-CM | POA: Insufficient documentation

## 2020-09-07 DIAGNOSIS — I5022 Chronic systolic (congestive) heart failure: Secondary | ICD-10-CM

## 2020-09-07 DIAGNOSIS — Z79899 Other long term (current) drug therapy: Secondary | ICD-10-CM | POA: Insufficient documentation

## 2020-09-07 DIAGNOSIS — M25569 Pain in unspecified knee: Secondary | ICD-10-CM | POA: Diagnosis not present

## 2020-09-07 DIAGNOSIS — I483 Typical atrial flutter: Secondary | ICD-10-CM | POA: Diagnosis not present

## 2020-09-07 DIAGNOSIS — E66813 Obesity, class 3: Secondary | ICD-10-CM

## 2020-09-07 DIAGNOSIS — D508 Other iron deficiency anemias: Secondary | ICD-10-CM | POA: Diagnosis not present

## 2020-09-07 DIAGNOSIS — I110 Hypertensive heart disease with heart failure: Secondary | ICD-10-CM | POA: Insufficient documentation

## 2020-09-07 DIAGNOSIS — I428 Other cardiomyopathies: Secondary | ICD-10-CM | POA: Diagnosis not present

## 2020-09-07 DIAGNOSIS — Z6841 Body Mass Index (BMI) 40.0 and over, adult: Secondary | ICD-10-CM | POA: Diagnosis not present

## 2020-09-07 DIAGNOSIS — M25562 Pain in left knee: Secondary | ICD-10-CM | POA: Diagnosis not present

## 2020-09-07 DIAGNOSIS — I2699 Other pulmonary embolism without acute cor pulmonale: Secondary | ICD-10-CM

## 2020-09-07 DIAGNOSIS — Z7901 Long term (current) use of anticoagulants: Secondary | ICD-10-CM | POA: Insufficient documentation

## 2020-09-07 DIAGNOSIS — D509 Iron deficiency anemia, unspecified: Secondary | ICD-10-CM | POA: Diagnosis not present

## 2020-09-07 DIAGNOSIS — G4733 Obstructive sleep apnea (adult) (pediatric): Secondary | ICD-10-CM | POA: Diagnosis not present

## 2020-09-07 DIAGNOSIS — Z7984 Long term (current) use of oral hypoglycemic drugs: Secondary | ICD-10-CM | POA: Insufficient documentation

## 2020-09-07 DIAGNOSIS — Z8249 Family history of ischemic heart disease and other diseases of the circulatory system: Secondary | ICD-10-CM | POA: Diagnosis not present

## 2020-09-07 DIAGNOSIS — M1710 Unilateral primary osteoarthritis, unspecified knee: Secondary | ICD-10-CM | POA: Insufficient documentation

## 2020-09-07 LAB — BASIC METABOLIC PANEL
Anion gap: 8 (ref 5–15)
BUN: 12 mg/dL (ref 6–20)
CO2: 25 mmol/L (ref 22–32)
Calcium: 8.5 mg/dL — ABNORMAL LOW (ref 8.9–10.3)
Chloride: 105 mmol/L (ref 98–111)
Creatinine, Ser: 1.09 mg/dL (ref 0.61–1.24)
GFR, Estimated: >60 mL/min (ref >60)
Glucose, Bld: 108 mg/dL — ABNORMAL HIGH (ref 70–99)
Potassium: 3.5 mmol/L (ref 3.5–5.1)
Sodium: 138 mmol/L (ref 135–145)

## 2020-09-07 LAB — DIGOXIN LEVEL: Digoxin Level: 0.8 ng/mL (ref 0.8–2.0)

## 2020-09-07 LAB — BRAIN NATRIURETIC PEPTIDE: B Natriuretic Peptide: 13.2 pg/mL (ref 0.0–100.0)

## 2020-09-07 MED ORDER — ENTRESTO 97-103 MG PO TABS: 1.0000 | ORAL_TABLET | Freq: Two times a day (BID) | ORAL | 6 refills | Status: DC

## 2020-09-07 NOTE — Patient Instructions (Addendum)
Increase Entresto to 97/103 mg Twice daily   Labs done today, your results will be available in MyChart, we will contact you for abnormal readings.  Your physician recommends that you return for lab work in: 1-2 weeks  Please follow up with our heart failure pharmacist in 3-4 weeks  You have been referred to Pharmacy Clinic at [facility] for weight loss medication, they will call you for an appointment  Please call [facility]) at [phone] to register for a Bariatric Surgery Seminar  You have been referred to [facility] GI, they will call you for an appointment  You have been referred to Dr August Saucer in Orthopedics, they will call you for an appointment  Your physician recommends that you schedule a follow-up appointment in: 3 months  If you have any questions or concerns before your next appointment please send [place] a message through [facility] or call our office at [phone].    TO LEAVE A MESSAGE FOR THE NURSE SELECT OPTION 2, PLEASE LEAVE A MESSAGE INCLUDING: YOUR NAME DATE OF BIRTH CALL BACK NUMBER REASON FOR CALL**this is important as we prioritize the call backs  YOU WILL RECEIVE A CALL BACK THE SAME DAY AS LONG AS YOU CALL BEFORE 4:00 PM  At the Advanced Heart Failure Clinic, you and your health needs are our priority. As part of our continuing mission to provide you with exceptional heart care, we have created designated Provider Care Teams. These Care Teams include your primary Cardiologist (physician) and Advanced Practice Providers (APPs- Physician Assistants and Nurse Practitioners) who all work together to provide you with the care you need, when you need it.   You may see any of the following providers on your designated Care Team at your next follow up: Dr Arvilla Meres Dr [name] Dr Brandon Melnick, NP Robbie Lis, [place] Mikki Santee Karle Plumber, PharmD   Please be sure to bring in all your medications bottles to  every appointment.

## 2020-09-07 NOTE — Progress Notes (Signed)
Advanced Heart Failure Clinic Note    PCP: Esperanza Richters, PA-C Primary Cardiologist: Dr Servando Salina  HF Cardiologist: Dr. Shirlee Latch  HPI: 60 y.o. w/ h/o HTN, HLD, obesity, severe knee OA, tobacco use and NICM, first diagnosed in 05/2018 when admitted for acute CHF. Echo showed LVEF 20-25%. RV was moderately reduced. LHC showed normal cors.    Had 2D echo on 02/29/20 showing EF 25-30%. RV mild-moderately reduced.    Seen at [facility] (03/03/20) w/ complaints of 4 day h/o increased dyspnea w/ hypoxia and pleuritic chest pain. Noted to be in sinus tach w/ HR in the 130s. Given concern for PE, he was referred to [facility] ED where CT angio confirmed Rt middle and lower lobe PE. No evidence of RV strain. Admitted and started on IV heparin. LE Venous doppler + for Lt DVT.  Echo done (03/04/20) RV moderately enlarged w/ mild-moderately reduced systolic function. Not significantly changed from echo 02/29/20. BNP also normal at 27. He remained hemodynamically stable. Hospitalization c/b atrial flutter, s/p successful TEE with DCCV (03/07/20). He was discharged on GDMT, discharge weight 360 lbs.   Echo in 5/22 showed EF 35-40%, global hypokinesis, mild RVE with normal function, PASP 31 mmHg.   Today he returns for HF follow up.  Weight is up 7 lbs.  He has ongoing right knee pain from OA, hard to walk far.  He gets tired and short of breath with short distances due to the effort it takes for him to walk with knee and back pain.  He has been unable to work, primarily due to knee and back pain. No chest pain.  No lightheadedness.  He sleeps in a recliner and reports orthopnea.  He has occasional BRBPR and has been noted to have Fe deficiency anemia.  He has been diagnosed with sleep apnea, pending CPAP evaluation.   ECG (personally reviewed): NSR, nonspecific T wave changes  Labs (3/22): K 4, creatinine 1.3 Labs (8/22): K 3.7, creatinine 1.13, hgb 9.9, transferrin saturation 8%  PMH: 1. HTN 2. Hyperlipidemia 3.  Hypothyroidism 4. OA: Knees 5. Prior smoker 6. Venous thromboembolism: Left DVT with PE in 2/22.  7. Atrial flutter: 2/22, s/p TEE-guided DCCV.  8. Chronic systolic CHF: Nonischemic cardiomyopathy.  - Echo (5/20): EF 20-25%.  - LHC/RHC (5/20): No significant CAD. Mean RA 6, PA 41/13, mean PCWP 20, CI 3.21.  - TEE (2/22): EF 20-25%, moderate LV dilation, mild RV enlargement with moderately decreased systolic function.  - Cardiac MRI (2/22): Moderate LV dilation with EF 42%, moderate RV dilation with EF 42%, nonspecific RV insertion site LGE.  - Echo (5/22): EF 35-40%, global hypokinesis, mild RVE with normal function, PASP 31 mmHg.  9. Obesity 10. Gout 11. Fe deficiency anemia  ROS: All systems negative except as listed in HPI, PMH and Problem List.  SH:  Social History   Socioeconomic History   Marital status: Married    Spouse name: Not on file   Number of children: Not on file   Years of education: Not on file   Highest education level: Not on file  Occupational History   Not on file  Tobacco Use   Smoking status: Former    Packs/day: 1.00    Years: 20.00    Pack years: 20.00    Types: Cigarettes    Quit date: 05/23/2018    Years since quitting: 2.2   Smokeless tobacco: Never  Vaping Use   Vaping Use: Never used  Substance and Sexual Activity  Alcohol use: Yes    Alcohol/week: 0.0 standard drinks    Comment: 6 pack a week spread out over weekend   Drug use: No   Sexual activity: Yes  Other Topics Concern   Not on file  Social History Narrative   [employer].   Married lives with wife, mother in law and daughter.  7 children.     Social Determinants of Health   Financial Resource Strain: High Risk   Difficulty of Paying Living Expenses: Hard  Food Insecurity: No Food Insecurity   Worried About Programme researcher, broadcasting/film/video in the Last Year: Never true   Ran Out of Food in the Last Year: Never true  Transportation Needs: No Transportation Needs   Lack of Transportation  (Medical): No   Lack of Transportation (Non-Medical): No  Physical Activity: Not on file  Stress: Not on file  Social Connections: Not on file  Intimate Partner Violence: Not on file   FH:  Family History  Problem Relation Age of Onset   Hearing loss Mother    Cancer Father    Heart failure Other    Heart failure Paternal Grandfather    Current Outpatient Medications  Medication Sig Dispense Refill   albuterol (PROVENTIL HFA;VENTOLIN HFA) 108 (90 Base) MCG/ACT inhaler Inhale 2 puffs into the lungs every 6 (six) hours as needed for wheezing or shortness of breath. 1 Inhaler 2   allopurinol (ZYLOPRIM) 100 MG tablet TAKE 1 TABLET(100 MG) BY MOUTH DAILY 30 tablet 6   apixaban (ELIQUIS) 5 MG TABS tablet Take 1 tablet (5 mg total) by mouth 2 (two) times daily. 180 tablet 3   carvedilol (COREG) 6.25 MG tablet Take 1 tablet (6.25 mg total) by mouth 2 (two) times daily with a meal. 180 tablet 3   digoxin (LANOXIN) 0.125 MG tablet Take 1 tablet (0.125 mg total) by mouth daily. 90 tablet 3   FARXIGA 10 MG TABS tablet TAKE 1 TABLET(10 MG) BY MOUTH DAILY 30 tablet 2   levothyroxine (SYNTHROID) 25 MCG tablet Take 1 tablet (25 mcg total) by mouth daily before breakfast. 30 tablet 11   sacubitril-valsartan (ENTRESTO) 97-103 MG Take 1 tablet by mouth 2 (two) times daily. 60 tablet 6   spironolactone (ALDACTONE) 25 MG tablet TAKE 1 TABLET(25 MG) BY MOUTH DAILY 30 tablet 5   torsemide (DEMADEX) 20 MG tablet TAKE 4 TABLETS (80 MG) BY MOUTH DAILY. 120 tablet 2   No current facility-administered medications for this encounter.    Vitals:   09/07/20 0936  BP: 118/70  Pulse: 73  SpO2: 96%  Weight: (!) 173.5 kg (382 lb 6.4 oz)   Wt Readings from Last 3 Encounters:  09/07/20 (!) 173.5 kg (382 lb 6.4 oz)  08/22/20 (!) 168.7 kg (372 lb)  06/07/20 (!) 170.1 kg (375 lb)   PHYSICAL EXAM: General: NAD, obese Neck: No JVD, no thyromegaly or thyroid nodule.  Lungs: Clear to auscultation bilaterally with  normal respiratory effort. CV: Nondisplaced PMI.  Heart regular S1/S2, no S3/S4, no murmur.  No peripheral edema.  No carotid bruit.  Normal pedal pulses.  Abdomen: Soft, nontender, no hepatosplenomegaly, no distention.  Skin: Intact without lesions or rashes.  Neurologic: Alert and oriented x 3.  Psych: Normal affect. Extremities: No clubbing or cyanosis.  HEENT: Normal.    ASSESSMENT & PLAN: 1. PE possibly with infarction and LLE DVT: 2/22, unprovoked.  Patient has no FH of VTE.  He had not been sedentary, had been working full time at the  post office. Echo in 2/22 with moderate RV dilation/moderate dysfunction. Echo in 5/22 showed mild RV enlargement, normal RV function.  - Continue Eliquis long-term.  2. Atrial flutter: Typical flutter.  This was noted at when he was admitted for PE in 2/22.  Suspect this was triggered by PE.  DCCV in 2/22. He is in NSR today, no longer taking amiodarone.  - Continue anticoagulation therapy w/ Eliquis.    - If recurrence, should have atrial flutter ablation.   3. Chronic systolic CHF: Diagnosed in 2020.  Cath from 5/20 with no significant CAD.  Cause of CMP is uncertain, nonischemic cardiomyopathy. ?Viral myocarditis.  02/2020 cMRI LV/RV EF 42% with only nonspecific RV insertion site LGE.  Echo in 5/22 showed EF 35-40% with mild RVE and normal RV function.  He is not volume overloaded on exam, but has NYHA class III symptoms, likely worsened by knee and back pain which limit exertion as well.  - Continue Coreg 6.25 mg bid.  - Continue spironolactone 25 mg daily. - Continue digoxin 0.125, check level today.  - Increase Entresto to 97/103 bid with BMET/BNP today and BMET in 10 days.  - Continue torsemide 80 mg daily. - Continue dapagliflozin 10 mg daily.  - EF appears just out of range for ICD.  Narrow QRS so not candidate for CRT.  4. Obesity: Body mass index is 54.87 kg/m.  Obesity is a major contributor to his knee pain and may contribute to his  cardiomyopathy.  [facility]y [facility] Wellness clinic was going to be too expensive.   - I will make referral to the [facility] bariatrics program.  - I will refer to pharmacy clinic to see if we can get him semaglutide for weight loss.  5. OSA: Scheduled for CPAP titration.  6. Fe deficiency anemia: Transferrin saturation 8%, has had Fe infusion. He reports BRBPR.  - He needs referral to GI for evaluation, has never had colonoscopy.  7. Knee OA: Knee pain is a major limitor.  He is probably not a surgical candidate at this time due to morbid obesity, but he may be able to get a steroid injection to at temporarily alleviate the pain.  - Refer to orthopedics.  8. He has questions about disability, our social worker will see him today.   Followup HF pharmacist in 3-4 weeks for med titration, see me in 3 months.   [name] 09/07/2020

## 2020-09-07 NOTE — Progress Notes (Signed)
Heart and Vascular Care Navigation  09/07/2020  Drew [name] 1960-05-29 [id number]  Reason for Referral: Patient seen in HF Clinic to assist with disability and financial assistance.    Engaged with patient face to face for initial visit for Heart and Vascular Care Coordination.                                                                                                   Assessment: Patient is a 60 yo male who has been married for 12 years to his wife. He reports they live in a single family home just the two of them. He states that he has worked for the [employer] his whole life along with his wife. They have known each other since childhood but only married 12 years ago.  Patient has not worked since January, 2022 when he started not feeling well. He states he has used up all his sick time and currently has no income. His wife is also out of work although she is receiving disability from the [employer] as well as a SSD check.  Patient asking questions about the disability process and how to get started. Wife mentioned that they are struggling with finances and recently their washer and dryer broke and they have been going to the laundromat to wash clothes as they don't have the monies to repair or buy new.                                HRT/VAS Care Coordination     Patients Home Cardiology Office Heart Failure Clinic   Outpatient Care Team Social Worker   Social Worker Name: Lasandra Beech, [place] [phone]   Living arrangements for the past 2 months Single Family Home   Lives with: Self; Spouse   Patient Current Insurance Scientist, water quality   Patient Has Concern With Paying Medical Bills No   Does Patient Have Prescription Coverage? Yes   Home Assistive Devices/Equipment None   DME Agency NA   HH Agency NA       Social History:                                                                             SDOH Screenings   Alcohol Screen: Not on file   Depression (TOI7-1): Not on file  Financial Resource Strain: High Risk   Difficulty of Paying Living Expenses: Hard  Food Insecurity: No Food Insecurity   Worried About Running Out of Food in the Last Year: Never true   Ran Out of Food in the Last Year: Never true  Housing: Not on file  Physical Activity: Not on file  Social Connections: Not on file  Stress: Not on file  Tobacco Use: Medium  Risk   Smoking Tobacco Use: Former   Smokeless Tobacco Use: Never  Transportation Needs: No Regulatory affairs officer (Medical): No   Lack of Transportation (Non-Medical): No    SDOH Interventions: Financial Resources:  Financial Strain Interventions: Other (Comment) Will assist with Patient Care Funds for washer/dryer repairs  Food Insecurity:  Food Insecurity Interventions: Intervention Not Indicated  Housing Insecurity:  Housing Interventions: Intervention Not Indicated  Transportation:   Transportation Interventions: Intervention Not Indicated    Follow-up plan:  CSW provided supportive assistance and completed referral to [facility] for disability application. CSW discussed possible assistance through the patient care fund to repair their washer and dryer. Patient and wife overwhelmed with gratitude and will follow up with CSW after exploring repairs. CSW available as needed. Lasandra Beech, LCSW, CCSW-MCS [phone]

## 2020-09-08 ENCOUNTER — Other Ambulatory Visit (HOSPITAL_BASED_OUTPATIENT_CLINIC_OR_DEPARTMENT_OTHER): Payer: Self-pay

## 2020-09-08 ENCOUNTER — Telehealth: Payer: Self-pay | Admitting: Student-PharmD

## 2020-09-08 MED ORDER — OZEMPIC (0.25 OR 0.5 MG/DOSE) 2 MG/1.5ML ~~LOC~~ SOPN
PEN_INJECTOR | SUBCUTANEOUS | 1 refills | Status: DC
  Filled 2020-09-08: qty 1.5, 28d supply, fill #0

## 2020-09-08 NOTE — Telephone Encounter (Signed)
Received referral from Dr. Shirlee Latch to look into coverage for semaglutide for weight management for this patient. His insurance plan does not cover any weight loss medications [facility]) but does cover Ozempic as a tier 2 medication.   Called the patient who confirmed interest in starting Ozempic. Confirmed that he does not have any contraindications for use including personal or family history of medullary thyroid carcinoma. Have sent in Rx to [facility] as they have this in stock (experiencing shortages of Ozempic at chain pharmacies). Confirmed cost with pharmacy is $24.99 so no copay card is needed.   Scheduled him for initial visit with PharmD clinic on 9/1 to review injection technique and administer first dose in the office.

## 2020-09-15 ENCOUNTER — Other Ambulatory Visit: Payer: Self-pay

## 2020-09-15 ENCOUNTER — Ambulatory Visit (INDEPENDENT_AMBULATORY_CARE_PROVIDER_SITE_OTHER): Payer: Federal, State, Local not specified - PPO | Admitting: Pharmacist

## 2020-09-15 VITALS — Ht 71.0 in | Wt 381.0 lb

## 2020-09-15 NOTE — Progress Notes (Signed)
Patient ID: Drew [name]                 DOB: 22-Dec-1960                    MRN: [id number]     HPI: Drew [name] is a 60 y.o. male patient referred to pharmacy clinic by Dr. Aundra Dubin to initiate weight loss therapy with GLP1-RA. PMH is significant for obesity complicated by chronic medical conditions including  HTN, HLD, obesity, severe knee OA, hx PE, iron deficiency, tobacco use and NICM. Most recent BMI 53.58m/m2.  Patient presents today with his wife. He states that he eats what his wife cooks him, but it appears that there are times he snacks on chips/cookies/candy. He knows he needs to loose weight because he can barely walk. Does not exercise. States he overall does not eat much. Sometimes will only eat 1 meal a day. Sometimes 2 meals  Current weight management medications: none  Previously tried meds:none  Current meds that may affect weight: levothyroxine  Baseline weight/BMI: 381lb/53.14kg/m2  Insurance payor: BCBS FEP  Diet:  -Breakfast: nothing -Lunch: sometimes he eats sometimes he doesn't (eats 1-2 meals per day) Cobb salad from chicken fil A -Dinner: steak and eggs biscuits, baked chicken, spaghetti, t[place] peas, potatoes, green beans, broccoli rhab -Snacks: cookies, potato chips, mike and ikes -Drinks: lemonade, ice tea, grape crush soda, water  Exercise: none  Family History:  Family History  Problem Relation Age of Onset   Hearing loss Mother    Cancer Father    Heart failure Other    Heart failure Paternal Grandfather     Social History: former smoker- quite in 2020, 2 pint of vodka per week   Labs: Lab Results  Component Value Date   HGBA1C 6.5 04/28/2020    Wt Readings from Last 1 Encounters:  09/07/20 (!) 382 lb 6.4 oz (173.5 kg)    BP Readings from Last 1 Encounters:  09/07/20 118/70   Pulse Readings from Last 1 Encounters:  09/07/20 73       Component Value Date/Time   CHOL 152 05/25/2018 0343   TRIG 69 05/25/2018 0343   HDL  36 (L) 05/25/2018 0343   CHOLHDL 4.2 05/25/2018 0343   VLDL 14 05/25/2018 0343   LDLCALC 102 (H) 05/25/2018 0343    Past Medical History:  Diagnosis Date   Acute CHF (congestive heart failure) ([facility] 05/24/2018   Acute on chronic combined systolic and diastolic CHF (congestive heart failure) (HCC)    Acute systolic HF (heart failure) ([facility] 06/02/2018   AKI (acute kidney injury) ([facility] 03/03/2020   Alcohol use 05/24/2018   Allergy    Chronic systolic CHF (congestive heart failure) ([place] 03/03/2020   Dilated cardiomyopathy ([facility]    Essential hypertension 05/24/2018   HFrEF (heart failure with reduced ejection fraction) ([facility] [place] 11/20/2019   HTN (hypertension)    Hyperlipidemia 05/24/2018   Hypokalemia 03/03/2020   IDA (iron deficiency anemia) 08/23/2020   Knee pain, left 02/11/2014   New onset of congestive heart failure ([facility] 05/23/2018   Nonischemic cardiomyopathy ([facility] 11/20/2019   Obesity, Class III, BMI 40-49.9 (morbid obesity) ([facility] 05/24/2018   Pulmonary embolism ([facility] 03/03/2020   Seasonal allergies 02/11/2014   Tobacco abuse 05/24/2018   Typical atrial flutter ([place]    Wellness examination 02/26/2014    Current Outpatient Medications on File Prior to Visit  Medication Sig Dispense Refill   albuterol (PROVENTIL HFA;VENTOLIN HFA) 108 (90 Base) MCG/ACT inhaler  Inhale 2 puffs into the lungs every 6 (six) hours as needed for wheezing or shortness of breath. 1 Inhaler 2   allopurinol (ZYLOPRIM) 100 MG tablet TAKE 1 TABLET(100 MG) BY MOUTH DAILY 30 tablet 6   apixaban (ELIQUIS) 5 MG TABS tablet Take 1 tablet (5 mg total) by mouth 2 (two) times daily. 180 tablet 3   carvedilol (COREG) 6.25 MG tablet Take 1 tablet (6.25 mg total) by mouth 2 (two) times daily with a meal. 180 tablet 3   digoxin (LANOXIN) 0.125 MG tablet Take 1 tablet (0.125 mg total) by mouth daily. 90 tablet 3   FARXIGA 10 MG TABS tablet TAKE 1 TABLET(10 MG) BY MOUTH DAILY 30 tablet 2   levothyroxine (SYNTHROID) 25 MCG tablet Take 1 tablet  (25 mcg total) by mouth daily before breakfast. 30 tablet 11   sacubitril-valsartan (ENTRESTO) 97-103 MG Take 1 tablet by mouth 2 (two) times daily. 60 tablet 6   Semaglutide,0.25 or 0.5MG/DOS, (OZEMPIC, 0.25 OR 0.5 MG/DOSE,) 2 MG/1.5ML SOPN Inject 0.25 mg into the stomach once weekly for 4 weeks, then increase as tolerated to 0.5 mg weekly. 1.5 mL 1   spironolactone (ALDACTONE) 25 MG tablet TAKE 1 TABLET(25 MG) BY MOUTH DAILY 30 tablet 5   torsemide (DEMADEX) 20 MG tablet TAKE 4 TABLETS (80 MG) BY MOUTH DAILY. 120 tablet 2   No current facility-administered medications on file prior to visit.    Allergies  Allergen Reactions   Tetracyclines & Related Hives and Itching     Assessment/Plan:  1. Weight loss - Patient has not met goal of at least 5% of body weight loss with comprehensive lifestyle modifications alone in the past 3-6 months. Pharmacotherapy is appropriate to pursue as augmentation. Will start Ozempic 0.56m weekly. Confirmed patient not pregnant and no personal or family history of medullary thyroid carcinoma (MTC) or Multiple Endocrine Neoplasia syndrome type 2 (MEN 2).   Advised patient on common side effects including nausea, diarrhea, dyspepsia, decreased appetite, and fatigue. Counseled patient on reducing meal size and how to titrate medication to minimize side effects. Counseled patient to call if intolerable side effects or if experiencing dehydration, abdominal pain, or dizziness. Patient will adhere to dietary modifications and will target at least 150 minutes of moderate intensity exercise weekly.   An extensive review of patient's diet was had. It appears a good portion of his calories are coming from his liquid intake (sugary drinks and alcohol). We had a long discussion about cutting back/slowly eliminating this. Limit starches (potatoes, pasta, bread, corn) to once per week.  Encouraged to make small changes as to not overwhelm himself. Would recommend starting with  liquids changes.  Injection technique provided. Patient successfully injected 0.227mof Ozempic into his left abdomen.  I will call patient in 4 weeks to see how he is doing and remind him to increase Ozempic to 0.29m7meekly.  [facility] I believe both will benefit patient. [facility].  Follow up in 3 months in person.  Thank you,  MelRamond Dialharm.D, BCPS, CPP [id number] LanereIndian HillsC [id number]: [phone]: [phone]

## 2020-09-15 NOTE — Patient Instructions (Addendum)
Cut back on starches (corn, peas, potatoes) Please try to cut back significantly on alcohol (2 beers per week) and sugary drinks Call me at [phone] with any questions  GLP-1 Receptor Agonist Counseling Points This medication reduces your appetite and may make you feel fuller longer.  Stop eating when your body tells you that you are full. This will likely happen sooner than you are used to. Store your medication in the fridge until you are ready to use it. Inject your medication in the fatty tissue of your lower abdominal area (2 inches away from belly button) or upper outer thigh. Rotate injection sites. Each pen will last you about 1 month (the first month it will last a few weeks longer). Use a different needle with each weekly injection. Common side effects include: nausea, diarrhea/constipation, and heartburn, and are more likely to occur if you overeat.  Dosing schedule: - Week 1: Inject 0.25 once weekly - Week 2: Inject 0.25 once weekly - Week 3: Inject 0.25 once weekly - Week 4: Inject 0.25 once weekly  Tips for living a healthier life  SUGAR  Sugar is a huge problem in the modern day diet. Sugar is a big contributor to heart disease, diabetes, high triglyceride levels, fatty liver disease and obesity. Sugar is hidden in almost all packaged foods/beverages. Added sugar is extra sugar that is added beyond what is naturally found and has no nutritional benefit for your body. The American Heart Association recommends limiting added sugars to no more than 25g for women and 36 grams for men per day. There are many names for sugar including maltose, sucrose (names ending in "ose"), high fructose corn syrup, molasses, cane sugar, corn sweetener, raw sugar, syrup, honey or fruit juice concentrate.   One of the best ways to limit your added sugars is to stop drinking sweetened beverages such as soda, sweet tea, and fruit juice. There is 65g of added sugars in one 20oz bottle of Coke!  That is equal to 6 donuts.   Pay attention and read all nutrition facts labels. Below is an examples of a nutrition facts label. The #1 is showing you the total sugars where the # 2 is showing you the added sugars. This one serving has almost the max amount of added sugars per day!     EXERCISE  Exercise is good. We've all heard that. In an ideal world, we would all have time and resources to get plenty of it. When you are active, your heart pumps more efficiently and you will feel better.  Multiple studies show that even walking regularly has benefits that include living a longer life. The American Heart Association recommends 150 minutes per week of exercise (30 minutes per day most days of the week). You can do this in any increment you wish. Nine or more 10-minute walks count. So does an hour-long exercise class. Break the time apart into what will work in your life. Some of the best things you can do include walking briskly, jogging, cycling or swimming laps. Not everyone is ready to "exercise." Sometimes we need to start with just getting active. Here are some easy ways to be more active throughout the day:  Take the stairs instead of the elevator  Go for a 10-15 minute walk during your lunch break (find a friend to make it more enjoyable)  When shopping, park at the back of the parking lot  If you take public transportation, get off one stop early and walk the  extra distance  Pace around while making phone calls  Check with your doctor if you aren't sure what your limitations may be. Always remember to drink plenty of water when doing any type of exercise. Don't feel like a failure if you're not getting the 90-150 minutes per week. If you started by being a couch potato, then just a 10-minute walk each day is a huge improvement. Start with little victories and work your way up.   HEALTHY EATING TIPS  When looking to improve your eating habits, whether to lose weight, lower blood pressure  or just be healthier, it helps to know what a serving size is.   Grains 1 slice of bread,  bagel,  cup pasta or rice  Vegetables 1 cup fresh or raw vegetables,  cup cooked or canned Fruits 1 piece of medium sized fruit,  cup canned,   Meats/Proteins  cup dried       1 oz meat, 1 egg,  cup cooked beans, nuts or seeds  Dairy        Fats Individual yogurt container, 1 cup (8oz)    1 teaspoon margarine/butter or vegetable  milk or milk alternative, 1 slice of cheese          oil; 1 tablespoon mayonnaise or salad dressing                  Plan ahead: make a menu of the meals for a week then create a grocery list to go with that menu. Consider meals that easily stretch into a night of leftovers, such as stews or casseroles. Or consider making two of your favorite meal and put one in the freezer for another night. Try a night or two each week that is "meatless" or "no cook" such as salads. When you get home from the grocery store wash and prepare your vegetables and fruits. Then when you need them they are ready to go.   Tips for going to the grocery store:  Buy store or generic brands  Check the weekly ad from your store on-line or in their in-store flyer  Look at the unit price on the shelf tag to compare/contrast the costs of different items  Buy fruits/vegetables in season  Carrots, bananas and apples are low-cost, naturally healthy items  If meats or frozen vegetables are on sale, buy some extras and put in your freezer  Limit buying prepared or "ready to eat" items, even if they are pre-made salads or fruit snacks  Do not shop when you're hungry  Foods at eye level tend to be more expensive. Look on the high and low shelves for deals.  Consider shopping at the farmer's market for fresh foods in season.  Avoid the cookie and chip aisles (these are expensive, high in calories and low in nutritional value). Shop on the outside of the grocery store.  Healthy food preparations:  If you  can't get lean hamburger, be sure to drain the fat when cooking  Steam, saut (in olive oil), grill or bake foods  Experiment with different seasonings to avoid adding salt to your foods. Kosher salt, sea salt and Himalayan salt are all still salt and should be avoided. Try seasoning food with onion, garlic, thyme, rosemary, basil ect. Onion powder or garlic powder is ok. Avoid if it says salt (ie garlic salt).        Other resources: American Heart Association - MartiniMobile.it         Go to the  Healthy Living tab to get more information American Diabetes Association - www.diabetes.org         You don't have to be diabetic - check out the Food and Fitness tab

## 2020-09-16 ENCOUNTER — Telehealth: Payer: Self-pay

## 2020-09-16 NOTE — Telephone Encounter (Signed)
Call to patient reference re-referral to [facility] Described program to pt Is interested and realizes he needs to get to exercising d/t knee pain bilaterally making it hard for him to walk Does not walk with cane or walker No plans to have knees replaced Can do a daytime class, [facility] is likely closer to him however he may have difficulty accessing. Will assess at time of intake Explained next class for located at [place] will be end of Sept.  Will call him back as soon as dates/times confirmed.

## 2020-09-20 ENCOUNTER — Ambulatory Visit (HOSPITAL_COMMUNITY)
Admission: RE | Admit: 2020-09-20 | Discharge: 2020-09-20 | Disposition: A | Discharge status: Home / Self Care | Payer: Federal, State, Local not specified - PPO | Source: Ambulatory Visit | Attending: Cardiology | Admitting: Cardiology

## 2020-09-20 ENCOUNTER — Other Ambulatory Visit: Payer: Self-pay

## 2020-09-20 DIAGNOSIS — I5022 Chronic systolic (congestive) heart failure: Secondary | ICD-10-CM | POA: Diagnosis not present

## 2020-09-20 LAB — BASIC METABOLIC PANEL WITH GFR
Anion gap: 8 (ref 5–15)
BUN: 11 mg/dL (ref 6–20)
CO2: 26 mmol/L (ref 22–32)
Calcium: 8.8 mg/dL — ABNORMAL LOW (ref 8.9–10.3)
Chloride: 102 mmol/L (ref 98–111)
Creatinine, Ser: 1.16 mg/dL (ref 0.61–1.24)
GFR, Estimated: >60 mL/min
Glucose, Bld: 112 mg/dL — ABNORMAL HIGH (ref 70–99)
Potassium: 3.6 mmol/L (ref 3.5–5.1)
Sodium: 136 mmol/L (ref 135–145)

## 2020-09-25 ENCOUNTER — Ambulatory Visit (HOSPITAL_BASED_OUTPATIENT_CLINIC_OR_DEPARTMENT_OTHER): Payer: Federal, State, Local not specified - PPO | Attending: Cardiology | Admitting: Cardiology

## 2020-09-25 ENCOUNTER — Other Ambulatory Visit: Payer: Self-pay

## 2020-09-25 DIAGNOSIS — G4733 Obstructive sleep apnea (adult) (pediatric): Secondary | ICD-10-CM | POA: Diagnosis not present

## 2020-09-25 DIAGNOSIS — G4736 Sleep related hypoventilation in conditions classified elsewhere: Secondary | ICD-10-CM | POA: Insufficient documentation

## 2020-09-25 DIAGNOSIS — R0902 Hypoxemia: Secondary | ICD-10-CM | POA: Diagnosis not present

## 2020-09-28 ENCOUNTER — Telehealth: Payer: Self-pay

## 2020-09-28 ENCOUNTER — Ambulatory Visit: Payer: Federal, State, Local not specified - PPO | Admitting: Orthopedic Surgery

## 2020-09-28 ENCOUNTER — Encounter: Payer: Self-pay | Admitting: Orthopedic Surgery

## 2020-09-28 ENCOUNTER — Ambulatory Visit: Payer: Self-pay

## 2020-09-28 ENCOUNTER — Other Ambulatory Visit: Payer: Self-pay

## 2020-09-28 DIAGNOSIS — M25562 Pain in left knee: Secondary | ICD-10-CM

## 2020-09-28 DIAGNOSIS — M1712 Unilateral primary osteoarthritis, left knee: Secondary | ICD-10-CM

## 2020-09-28 NOTE — Telephone Encounter (Signed)
Can we get auth for left knee gel injection? 

## 2020-09-30 NOTE — Telephone Encounter (Signed)
Noted  

## 2020-10-01 ENCOUNTER — Encounter: Payer: Self-pay | Admitting: Orthopedic Surgery

## 2020-10-01 NOTE — Progress Notes (Signed)
Office Visit Note   Patient: Drew [name]           Date of Birth: [birth year]           MRN: [id number] Visit Date: 09/28/2020 Requested by: Laurey Morale, MD [phone] [address] [place],  [place] [address] PCP: Marisue Brooklyn  Subjective: Chief Complaint  Patient presents with   Left Knee - New Patient (Initial Visit)    HPI: Drew [name] is a 60 y.o. male who presents to the office complaining of left knee pain.  Patient complains of chronic knee pain that he localizes to the medial aspect of the knee.  He denies any radiation of pain, radicular pain, numbness/tingling.  No pain in the right knee relative to the left.  He has history of prior arthroscopy in 2009 in [place] that helped for several years.  He wears a knee brace but does not take any medication for his pain aside from the occasional aspirin.  He cannot take anti-inflammatories as he is on Eliquis for recent PEs and DVTs.  He has seen Dr. Maggie Font for this problem.  He notes that his pain increases with walking and describes a sharp pain in the medial joint line.  No recent falls or injuries.  Last injection was years ago and provided about 2 weeks of relief by his history.  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  In his free time he enjoys riding motorcycles and working on cars and playing with his grandkids..                ROS: All systems reviewed are negative as they relate to the chief complaint within the history of present illness.  Patient denies fevers or chills.  Assessment & Plan: Visit Diagnoses:  1. Left knee pain, unspecified chronicity     Plan: Patient is a 60 year old male who presents for evaluation of left knee pain.  He has severe left knee osteoarthritis particularly in the medial compartment.  He really only has medial sided  pain.  He has had previous injection several years ago that provided several weeks of relief but has never had a gel injection.  Cannot take anti-inflammatories due to him being on Eliquis for PEs and DVTs.  Discussed options available to patient.  Really the only option available to him is Tylenol for pain, therapy exercises, serial injections.  Surgery is not an option at his BMI which is currently around 53.  In order to be considered for surgery, he would have to be below 40 BMI which for his height would be around 275 pounds.  He understands and he is currently trying to lose weight and has lost about 10 pounds.  Left knee injection administered today.  Plan to preapproved him for left knee gel injection for when the cortisone injection wears off.  This patient is diagnosed with osteoarthritis of the knee(s).    Radiographs show evidence of joint space narrowing, osteophytes, subchondral sclerosis and/or subchondral cysts.  This patient has knee pain which interferes with functional and activities of daily living.    This patient has experienced inadequate response, adverse effects and/or intolerance with conservative treatments such as acetaminophen, NSAIDS, topical creams, physical therapy or regular exercise, knee bracing and/or weight loss.   This  patient has experienced inadequate response or has a contraindication to intra articular steroid injections for at least 3 months.   This patient is not scheduled to have a total knee replacement within 6 months of starting treatment with viscosupplementation.   Follow-Up Instructions: No follow-ups on file.   Orders:  Orders Placed This Encounter  Procedures   XR KNEE 3 VIEW LEFT   No orders of the defined types were placed in this encounter.     Procedures: Large Joint Inj: L knee on 09/28/2020 9:16 AM Indications: diagnostic evaluation, joint swelling and pain Details: 18 G 1.5 in needle, superolateral approach  Arthrogram:  No  Medications: 5 mL lidocaine 1 %; 40 mg methylPREDNISolone acetate 40 MG/ML; 4 mL bupivacaine 0.25 % Aspirate: 10 mL Outcome: tolerated well, no immediate complications Procedure, treatment alternatives, risks and benefits explained, specific risks discussed. Consent was given by the patient. Immediately prior to procedure a time out was called to verify the correct patient, procedure, equipment, support staff and site/side marked as required. Patient was prepped and draped in the usual sterile fashion.      Clinical Data: No additional findings.  Objective: Vital Signs: There were no vitals taken for this visit.  Physical Exam:  Constitutional: Patient appears well-developed HEENT:  Head: Normocephalic Eyes:EOM are normal Neck: Normal range of motion Cardiovascular: Normal rate Pulmonary/chest: Effort normal Neurologic: Patient is alert Skin: Skin is warm Psychiatric: Patient has normal mood and affect  Ortho Exam: Ortho exam demonstrates left knee with 0 degrees extension and 110 degrees of knee flexion.  Small effusion noted.  Tenderness over the medial joint line primarily.  No calf tenderness.  Negative Homans' sign.  No hip pain with hip range of motion.  Negative straight leg raise.  Extensor mechanism intact.  Varus alignment.  Mildly correctable varus.  Specialty Comments:  No specialty comments available.  Imaging: No results found.   PMFS History: Patient Active Problem List   Diagnosis Date Noted   IDA (iron deficiency anemia) 08/23/2020   Acute on chronic combined systolic and diastolic CHF (congestive heart failure) (HCC)    Typical atrial flutter (HCC)    Pulmonary embolism (HCC) 03/03/2020   Chronic systolic CHF (congestive heart failure) (HCC) 03/03/2020   Hypokalemia 03/03/2020   AKI (acute kidney injury) (HCC) 03/03/2020   Allergy    Dilated cardiomyopathy (HCC)    HTN (hypertension)    Nonischemic cardiomyopathy (HCC) 11/20/2019   HFrEF (heart  failure with reduced ejection fraction) (HCC) 11/20/2019   Acute systolic HF (heart failure) (HCC) 06/02/2018   Essential hypertension 05/24/2018   Hyperlipidemia 05/24/2018   Tobacco abuse 05/24/2018   Alcohol use 05/24/2018   Obesity, Class III, BMI 40-49.9 (morbid obesity) (HCC) 05/24/2018   Acute CHF (congestive heart failure) (HCC) 05/24/2018   New onset of congestive heart failure (HCC) 05/23/2018   Wellness examination 02/26/2014   Knee pain, left 02/11/2014   Seasonal allergies 02/11/2014   Past Medical History:  Diagnosis Date   Acute CHF (congestive heart failure) (HCC) 05/24/2018   Acute on chronic combined systolic and diastolic CHF (congestive heart failure) (HCC)    Acute systolic HF (heart failure) (HCC) 06/02/2018   AKI (acute kidney injury) (HCC) 03/03/2020   Alcohol use 05/24/2018   Allergy    Chronic systolic CHF (congestive heart failure) (HCC) 03/03/2020   Dilated cardiomyopathy (HCC)    Essential hypertension 05/24/2018   HFrEF (heart failure with reduced ejection fraction) (HCC) 11/20/2019   HTN (hypertension)  Hyperlipidemia 05/24/2018   Hypokalemia 03/03/2020   IDA (iron deficiency anemia) 08/23/2020   Knee pain, left 02/11/2014   New onset of congestive heart failure (HCC) 05/23/2018   Nonischemic cardiomyopathy (HCC) 11/20/2019   Obesity, Class III, BMI 40-49.9 (morbid obesity) (HCC) 05/24/2018   Pulmonary embolism (HCC) 03/03/2020   Seasonal allergies 02/11/2014   Tobacco abuse 05/24/2018   Typical atrial flutter (HCC)    Wellness examination 02/26/2014    Family History  Problem Relation Age of Onset   Hearing loss Mother    Cancer Father    Heart failure Other    Heart failure Paternal Grandfather     Past Surgical History:  Procedure Laterality Date   CARDIOVERSION N/A 03/07/2020   Procedure: CARDIOVERSION;  Surgeon: Laurey Morale, MD;  Location: [facility] ENDOSCOPY;  Service: Cardiovascular;  Laterality: N/A;   KNEE ARTHROSCOPY  2009   RIGHT/LEFT HEART CATH AND  CORONARY ANGIOGRAPHY N/A 05/26/2018   Procedure: RIGHT/LEFT HEART CATH AND CORONARY ANGIOGRAPHY;  Surgeon: Lyn Records, MD;  Location: [facility] INVASIVE CV LAB;  Service: Cardiovascular;  Laterality: N/A;   TEE WITHOUT CARDIOVERSION N/A 03/07/2020   Procedure: TRANSESOPHAGEAL ECHOCARDIOGRAM (TEE);  Surgeon: Laurey Morale, MD;  Location: [facility] ENDOSCOPY;  Service: Cardiovascular;  Laterality: N/A;   THROAT SURGERY  2005   Social History   Occupational History   Not on file  Tobacco Use   Smoking status: Former    Packs/day: 1.00    Years: 20.00    Pack years: 20.00    Types: Cigarettes    Quit date: 05/23/2018    Years since quitting: 2.3   Smokeless tobacco: Never  Vaping Use   Vaping Use: Never used  Substance and Sexual Activity   Alcohol use: Yes    Alcohol/week: 0.0 standard drinks    Comment: 6 pack a week spread out over weekend   Drug use: No   Sexual activity: Yes

## 2020-10-02 ENCOUNTER — Encounter: Payer: Self-pay | Admitting: Orthopedic Surgery

## 2020-10-02 MED ORDER — METHYLPREDNISOLONE ACETATE 40 MG/ML IJ SUSP
40.0000 mg | INTRAMUSCULAR | Status: AC | PRN
  Administered 2020-09-28: 40 mg via INTRA_ARTICULAR

## 2020-10-02 MED ORDER — LIDOCAINE HCL 1 % IJ SOLN
5.0000 mL | INTRAMUSCULAR | Status: AC | PRN
  Administered 2020-09-28: 5 mL

## 2020-10-02 MED ORDER — BUPIVACAINE HCL 0.25 % IJ SOLN
4.0000 mL | INTRAMUSCULAR | Status: AC | PRN
  Administered 2020-09-28: 4 mL via INTRA_ARTICULAR

## 2020-10-04 ENCOUNTER — Other Ambulatory Visit: Payer: Self-pay

## 2020-10-04 ENCOUNTER — Other Ambulatory Visit (HOSPITAL_COMMUNITY): Payer: Self-pay

## 2020-10-04 ENCOUNTER — Ambulatory Visit (HOSPITAL_COMMUNITY)
Admission: RE | Admit: 2020-10-04 | Discharge: 2020-10-04 | Disposition: A | Discharge status: Home / Self Care | Payer: Federal, State, Local not specified - PPO | Source: Ambulatory Visit | Attending: Cardiology | Admitting: Cardiology

## 2020-10-04 VITALS — BP 112/68 | HR 53 | Wt 374.6 lb

## 2020-10-04 DIAGNOSIS — G4733 Obstructive sleep apnea (adult) (pediatric): Secondary | ICD-10-CM | POA: Insufficient documentation

## 2020-10-04 DIAGNOSIS — Z7984 Long term (current) use of oral hypoglycemic drugs: Secondary | ICD-10-CM | POA: Insufficient documentation

## 2020-10-04 DIAGNOSIS — E785 Hyperlipidemia, unspecified: Secondary | ICD-10-CM | POA: Insufficient documentation

## 2020-10-04 DIAGNOSIS — Z720 Tobacco use: Secondary | ICD-10-CM | POA: Diagnosis not present

## 2020-10-04 DIAGNOSIS — I5022 Chronic systolic (congestive) heart failure: Secondary | ICD-10-CM | POA: Diagnosis not present

## 2020-10-04 DIAGNOSIS — M1711 Unilateral primary osteoarthritis, right knee: Secondary | ICD-10-CM | POA: Insufficient documentation

## 2020-10-04 DIAGNOSIS — D509 Iron deficiency anemia, unspecified: Secondary | ICD-10-CM | POA: Insufficient documentation

## 2020-10-04 DIAGNOSIS — Z7901 Long term (current) use of anticoagulants: Secondary | ICD-10-CM | POA: Insufficient documentation

## 2020-10-04 DIAGNOSIS — I252 Old myocardial infarction: Secondary | ICD-10-CM | POA: Diagnosis not present

## 2020-10-04 DIAGNOSIS — Z597 Insufficient social insurance and welfare support: Secondary | ICD-10-CM | POA: Diagnosis not present

## 2020-10-04 DIAGNOSIS — I2699 Other pulmonary embolism without acute cor pulmonale: Secondary | ICD-10-CM | POA: Insufficient documentation

## 2020-10-04 DIAGNOSIS — Z79899 Other long term (current) drug therapy: Secondary | ICD-10-CM | POA: Diagnosis not present

## 2020-10-04 DIAGNOSIS — Z6841 Body Mass Index (BMI) 40.0 and over, adult: Secondary | ICD-10-CM | POA: Diagnosis not present

## 2020-10-04 DIAGNOSIS — I110 Hypertensive heart disease with heart failure: Secondary | ICD-10-CM | POA: Diagnosis not present

## 2020-10-04 DIAGNOSIS — I483 Typical atrial flutter: Secondary | ICD-10-CM | POA: Diagnosis not present

## 2020-10-04 MED ORDER — TORSEMIDE 20 MG PO TABS: ORAL_TABLET | ORAL | 6 refills | Status: DC

## 2020-10-04 NOTE — Patient Instructions (Addendum)
It was a pleasure seeing you today! ? ?MEDICATIONS: ?-No medication changes today ?-Call if you have questions about your medications. ? ? ?NEXT APPOINTMENT: ?Return to clinic in 2 months with Dr. McLean. ? ?In general, to take care of your heart failure: ?-Limit your fluid intake to 2 Liters (half-gallon) per day.   ?-Limit your salt intake to ideally 2-3 grams (2000-3000 mg) per day. ?-Weigh yourself daily and record, and bring that "weight diary" to your next appointment.  (Weight gain of 2-3 pounds in 1 day typically means fluid weight.) ?-The medications for your heart are to help your heart and help you live longer.   ?-Please contact us before stopping any of your heart medications. ? ?Call the clinic at [phone] with questions or to reschedule future appointments.  ?

## 2020-10-04 NOTE — Progress Notes (Signed)
PCP: Esperanza Richters, PA-C Primary Cardiologist: Dr Servando Salina HF Cardiologist: Dr. Shirlee Latch  HPI:  60 y.o. w/ history of HTN, HLD, obesity, severe knee OA, tobacco use and NICM, first diagnosed in 05/2018 when admitted for acute CHF. Echo showed LVEF 20-25%. RV was moderately reduced. LHC showed normal cors.    Had 2D echo on 02/29/20 showing EF 25-30%. RV mild-moderately reduced.    Seen at [facility] (03/03/20) w/ complaints of 4-day history of increased dyspnea w/ hypoxia and pleuritic chest pain. Noted to be in sinus tach w/ HR in the 130s. Given concern for PE, he was referred to [facility] ED where CT angio confirmed Rt middle and lower lobe PE. No evidence of RV strain. Admitted and started on IV heparin. LE Venous doppler + for Lt DVT. Echo done (03/04/20) RV moderately enlarged w/ mild-moderately reduced systolic function. Not significantly changed from echo on 02/29/20. BNP also normal at 27. He remained hemodynamically stable. Hospitalization complicated by atrial flutter, s/p successful TEE with DCCV (03/07/20). He was discharged on GDMT, discharge weight 360 lbs.    Echo in 05/2020 showed EF 35-40%, global hypokinesis, mild RVE with normal function, PASP 31 mmHg.    He returned on 09/07/20 to Heart Failure Clinic for HF follow up. Weight was up 7 lbs. He had ongoing right knee pain from OA, hard to walk far. Stated he gets tired and short of breath with short distances due to the effort it takes for him to walk with knee and back pain. He has been unable to work, primarily due to knee and back pain. No chest pain.  No lightheadedness. He sleeps in a recliner and reports orthopnea.  He has occasional BRBPR and has been noted to have Fe deficiency anemia. He has been diagnosed with sleep apnea, pending CPAP evaluation.   Today he returns to HF clinic for pharmacist medication titration. At last visit with MD, Sherryll Burger was increased to 97/103 mg BID. Overall, feeling fine. No dizziness, lightheadedness, chest  pain or palpitations. Breathing is fine. Gets fatigued and SOB sometimes when walking primarily due to his knee pain. Received cortisone injection 09/28/20 which has helped with the pain. Taking torsemide 80 mg daily, has not needed extra doses. Trace bilateral LEE, but states this is normal for him. Left ankle swells more than right due to prior DVT. No PND/orthopnea. Sleeps in recliner to avoid walking up stairs at night. Appetite has decreased since starting Ozempic. Patient is taking and tolerating all his medications.  HF Medications: Carvedilol 6.25 mg BID Entresto 97/103 mg BID Spironolactone 25 mg daily Farxiga 10 mg daily Digoxin 0.125 mg daily Torsemide 80 mg daily  Has the patient been experiencing any side effects to the medications prescribed? No, patient is tolerating all medications.  Does the patient have any problems obtaining medications due to transportation or finances? No - Patient has [employer].   Understanding of regimen: good Understanding of indications: good Potential of compliance: good Patient understands to avoid NSAIDs. Patient understands to avoid decongestants.    Pertinent Lab Values: 09/20/20: Serum creatinine 1.16, BUN 11, Potassium 3.6, Sodium 136, Digoxin (09/07/20) 0.8   Vital Signs: Weight: 374.6 (last clinic weight: 382.4 lbs) Blood pressure: 112/68  Heart rate: 53  Assessment/Plan: 1.Chronic systolic CHF: Diagnosed in 2020. Cath from 05/2018 with no significant CAD. Cause of CMP is uncertain, nonischemic cardiomyopathy. ?Viral myocarditis. 02/2020 cMRI LV/RV EF 42% with only nonspecific RV insertion site LGE. Echo in 05/2020 showed EF 35-40% with mild RVE  and normal RV function.  -He is not volume overloaded on exam, but has NYHA class II-III symptoms likely worsened by knee and back pain which limit exertion as well.  - Continue torsemide 80 mg daily - Continue carvedilol 6.25 mg BID. Unable to increase given low HR in clinic.  - Continue  Entresto 97/103 mg BID - Continue spironolactone 25 mg daily. - Continue Farxiga 10 mg daily - Continue digoxin 0.125 mg daily - EF appears just out of range for ICD. Narrow QRS so not candidate for CRT.  - No medication changes today. We discussed possibility of adding Bidil at next clinic visit. Patient will think about this.   2. PE possibly with infarction and LLE DVT: 02/2020, unprovoked. Patient has no FH of VTE. He had not been sedentary, had been working full time at the [employer]. Echo in 02/2020 with moderate RV dilation/moderate dysfunction. Echo in 05/2020 showed mild RV enlargement, normal RV function.  - Continue Eliquis long-term.   3. Atrial flutter: Typical flutter. This was noted when he was admitted for PE in 02/2020. Suspect this was triggered by PE.  DCCV in 02/2020. He was in NSR on 09/07/20, no longer taking amiodarone.  - Continue anticoagulation therapy w/ Eliquis.    - If recurrence, should have atrial flutter ablation.    4. Obesity: Body mass index is 52.25 kg/m. Obesity is a major contributor to his knee pain and may contribute to his cardiomyopathy. [facility] Wellness clinic was going to be too expensive. Was started on Ozempic by [facility] Pharmacist. - Dr. Shirlee Latch referred him to the [facility] bariatrics program. Not a candidate for surgery. Would have to be below 40 BMI (~275 lbs)  5. OSA: Scheduled for CPAP titration.   6. Fe deficiency anemia: Transferrin saturation 8%, has had Fe infusion. He reports BRBPR.  - Referral to GI for evaluation last clinic visit, has never had colonoscopy.   7. Knee OA: Knee pain is a major limitor. He is not a surgical candidate at this time due to morbid obesity, but was able to get a cortisone injection to temporarily alleviate the pain.  - Referred to orthopedics. Received cortisone injection 09/28/20.  8. He had questions about disability, talked with a Child psychotherapist on 09/07/20. Completed referral to [facility] for disability application.  Follow-up on 12/12/20 with Dr. Jonn Shingles, PharmD, BCPS, [facility], CPP Heart Failure Clinic Pharmacist [phone]

## 2020-10-09 ENCOUNTER — Other Ambulatory Visit: Payer: Self-pay | Admitting: Cardiology

## 2020-10-09 NOTE — Procedures (Signed)
   Patient Name: Nathanyal, Ashmead Date: 09/25/2020 Gender: Male D.O.B: 1960-01-31 Age (years): 13 Referring Provider: Armanda Magic MD, ABSM Height (inches): 71 Interpreting Physician: Armanda Magic MD, ABSM Weight (lbs): 381 RPSGT: Armen Pickup BMI: 53 MRN: [id number] Neck Size: 19.50  CLINICAL INFORMATION The patient is referred for a CPAP titration to treat sleep apnea.  SLEEP STUDY TECHNIQUE As per the AASM Manual for the Scoring of Sleep and Associated Events v2.3 (April 2016) with a hypopnea requiring 4% desaturations.  The channels recorded and monitored were frontal, central and occipital EEG, electrooculogram (EOG), submentalis EMG (chin), nasal and oral airflow, thoracic and abdominal wall motion, anterior tibialis EMG, snore microphone, electrocardiogram, and pulse oximetry. Continuous positive airway pressure (CPAP) was initiated at the beginning of the study and titrated to treat sleep-disordered breathing.  MEDICATIONS Medications self-administered by patient taken the night of the study : N/A  TECHNICIAN COMMENTS Comments added by technician: Pt had one restroom visted. O2 initiated due to low sats. Patient had difficulty initiating sleep. Comments added by scorer: N/A  RESPIRATORY PARAMETERS Optimal PAP Pressure (cm): 17  AHI at Optimal Pressure (/hr):0 Overall Minimal O2 (%):71.0  Supine % at Optimal Pressure (%): 88 Minimal O2 at Optimal Pressure (%): 84.0   SLEEP ARCHITECTURE The study was initiated at 10:47:14 PM and ended at 4:45:30 AM.  Sleep onset time was 33.9 minutes and the sleep efficiency was 56.1%. The total sleep time was 201 minutes.  The patient spent 4.7% of the night in stage N1 sleep, 69.2% in stage N2 sleep, 0.0% in stage N3 and 26.1% in REM. Stage REM latency was 44.0 minutes  Wake after sleep onset was 123.3. Alpha intrusion was absent. Supine sleep was 92.54%.  CARDIAC DATA The 2 lead EKG demonstrated sinus rhythm. The mean  heart rate was 66.4 beats per minute. Other EKG findings include: PVCs.  LEG MOVEMENT DATA The total Periodic Limb Movements of Sleep (PLMS) were 0. The PLMS index was 0.0. A PLMS index of <15 is considered normal in adults.  IMPRESSIONS - The optimal PAP pressure was 17 cm of water. - Severe oxygen desaturations were observed during this titration (min O2 = 71.0%). - No snoring was audible during this study. - No cardiac abnormalities were observed during this study. - Clinically significant periodic limb movements were not noted during this study. Arousals associated with PLMs were rare.  DIAGNOSIS - Obstructive Sleep Apnea (G47.33) - Nocturnal Hypoxemia  RECOMMENDATIONS - Trial of CPAP therapy on 17 cm H2O with a Medium size Philips Respironics Full Face Mask Amara View mask and heated humidification and O2 at 2L via CPAP. - Avoid alcohol, sedatives and other CNS depressants that may worsen sleep apnea and disrupt normal sleep architecture. - Sleep hygiene should be reviewed to assess factors that may improve sleep quality. - Weight management and regular exercise should be initiated or continued. - Return to Sleep Center for re-evaluation after 4 weeks of therapy  [Electronically signed] 10/09/2020 08:28 PM  Armanda Magic MD, ABSM Diplomate, American Board of Sleep Medicine

## 2020-10-10 ENCOUNTER — Telehealth: Payer: Self-pay | Admitting: *Deleted

## 2020-10-10 NOTE — Telephone Encounter (Signed)
Prescription refill request for Eliquis received. Indication:atrial flutter Last office visit:9/22 Scr:1.1 Age: 60 Weight:169.9 kg  Prescription refilled

## 2020-10-10 NOTE — Telephone Encounter (Signed)
-----   Message from Quintella Reichert, MD sent at 10/09/2020  8:31 PM EDT ----- Please let patient know that they had a successful PAP titration and let DME know that orders are in EPIC.  Please set up 6 week OV with me.

## 2020-10-10 NOTE — Telephone Encounter (Signed)
The patient has been notified of the result and verbalized understanding.  All questions (if any) were answered. Latrelle Dodrill, CMA 10/10/2020 5:55 PM    Upon patient request DME selection is [facility] Patient understands he will be contacted by [facility] to set up his cpap. Patient understands to call if [facility] does not contact him with new setup in a timely manner. Patient understands they will be called once confirmation has been received from [facility] that they have received their new machine to schedule 10 week follow up appointment.   [facility] notified of new cpap order  Please add to airview Patient was grateful for the call and thanked me.

## 2020-10-11 ENCOUNTER — Telehealth: Payer: Self-pay | Admitting: Pharmacist

## 2020-10-11 NOTE — Telephone Encounter (Signed)
I spoke to patient who states he is doing well on ozempic 0.25mg  weekly. He has noticed some appetite suppressions. States he is in the baby step process of cutting back on sugary drinks. States he is drinking more water and has reduced his intake of sugary drinks some. He will increase to ozempic 0.5mg  on Thursday. I will call him in a few weeks to follow up.

## 2020-10-21 ENCOUNTER — Telehealth: Payer: Self-pay

## 2020-10-21 NOTE — Telephone Encounter (Signed)
VOB submitted for SynviscOnce, left knee. Pending BV.

## 2020-10-25 ENCOUNTER — Encounter (HOSPITAL_COMMUNITY): Payer: Self-pay | Admitting: *Deleted

## 2020-10-25 ENCOUNTER — Telehealth: Payer: Self-pay

## 2020-10-25 NOTE — Progress Notes (Signed)
Received signed ROI from [employer] requesting records supporting pt's claim of permanent disability. Form along with records mailed to:  [facility] Attention: Disability Retirement [address] [place], [place] [address]

## 2020-10-25 NOTE — Telephone Encounter (Signed)
Tried calling patient to schedule for gel injection with Dr. August Saucer, but no answer and no VM setup to leave a message.  Approved for SynviscOne, left knee. Buy & Bill Covered at 100% through his insurance. No Co-pay No PA required

## 2020-10-26 ENCOUNTER — Other Ambulatory Visit (HOSPITAL_BASED_OUTPATIENT_CLINIC_OR_DEPARTMENT_OTHER): Payer: Self-pay

## 2020-10-26 MED ORDER — OZEMPIC (0.25 OR 0.5 MG/DOSE) 2 MG/1.5ML ~~LOC~~ SOPN
0.5000 mg | PEN_INJECTOR | SUBCUTANEOUS | 0 refills | Status: DC
  Filled 2020-10-26: qty 1.5, 28d supply, fill #0

## 2020-10-28 DIAGNOSIS — G4733 Obstructive sleep apnea (adult) (pediatric): Secondary | ICD-10-CM | POA: Diagnosis not present

## 2020-11-04 ENCOUNTER — Encounter (HOSPITAL_COMMUNITY): Payer: Self-pay

## 2020-11-07 ENCOUNTER — Telehealth: Payer: Self-pay | Admitting: Pharmacist

## 2020-11-07 ENCOUNTER — Other Ambulatory Visit (HOSPITAL_BASED_OUTPATIENT_CLINIC_OR_DEPARTMENT_OTHER): Payer: Self-pay

## 2020-11-07 MED ORDER — OZEMPIC (1 MG/DOSE) 4 MG/3ML ~~LOC~~ SOPN
1.0000 mg | PEN_INJECTOR | SUBCUTANEOUS | 1 refills | Status: DC
  Filled 2020-11-07: qty 3, 28d supply, fill #0

## 2020-11-07 NOTE — Telephone Encounter (Signed)
Attempted to call patient to follow up on how he is doing on Ozempic 0.5mg  weekly. He is to increase to 1mg  soon.  Also want to follow up on how he is doing with decreasing sugary drinks and alcohol. Attempted to call both numbers but no answer. Will send in Rx for 1mg  pen incase I am not able to reach patient prior to when he needs to increase. Mychart message also sent

## 2020-11-08 ENCOUNTER — Other Ambulatory Visit (HOSPITAL_BASED_OUTPATIENT_CLINIC_OR_DEPARTMENT_OTHER): Payer: Self-pay

## 2020-11-11 ENCOUNTER — Telehealth: Payer: Self-pay | Admitting: Cardiology

## 2020-11-11 DIAGNOSIS — G4733 Obstructive sleep apnea (adult) (pediatric): Secondary | ICD-10-CM

## 2020-11-11 NOTE — Telephone Encounter (Signed)
What problem are you experiencing? Patient states that the CPAP is turned up to high. It is at 17 and he choked the following 3 days after wearing it. He has only been able to wear it that one time.   Who is your medical equipment company? [facility]   Please route to the sleep study assistant.

## 2020-11-20 ENCOUNTER — Other Ambulatory Visit: Payer: Self-pay | Admitting: Medical

## 2020-11-22 NOTE — Telephone Encounter (Signed)
Order placed to [facility]. Patient wears a Medium size Philips Respironics Full Airline pilot mask

## 2020-11-23 ENCOUNTER — Other Ambulatory Visit: Payer: Self-pay

## 2020-11-23 ENCOUNTER — Inpatient Hospital Stay: Payer: Federal, State, Local not specified - PPO | Attending: Hematology & Oncology

## 2020-11-23 DIAGNOSIS — Z86718 Personal history of other venous thrombosis and embolism: Secondary | ICD-10-CM | POA: Diagnosis not present

## 2020-11-23 DIAGNOSIS — Z7901 Long term (current) use of anticoagulants: Secondary | ICD-10-CM | POA: Diagnosis not present

## 2020-11-23 DIAGNOSIS — Z79899 Other long term (current) drug therapy: Secondary | ICD-10-CM | POA: Diagnosis not present

## 2020-11-23 DIAGNOSIS — Z86711 Personal history of pulmonary embolism: Secondary | ICD-10-CM | POA: Diagnosis not present

## 2020-11-23 DIAGNOSIS — I824Y2 Acute embolism and thrombosis of unspecified deep veins of left proximal lower extremity: Secondary | ICD-10-CM

## 2020-11-23 DIAGNOSIS — I2699 Other pulmonary embolism without acute cor pulmonale: Secondary | ICD-10-CM

## 2020-11-23 DIAGNOSIS — D509 Iron deficiency anemia, unspecified: Secondary | ICD-10-CM | POA: Diagnosis not present

## 2020-11-23 LAB — CMP (CANCER CENTER ONLY)
ALT: 15 U/L (ref 0–44)
AST: 13 U/L — ABNORMAL LOW (ref 15–41)
Albumin: 4.2 g/dL (ref 3.5–5.0)
Alkaline Phosphatase: 44 U/L (ref 38–126)
Anion gap: 10 (ref 5–15)
BUN: 10 mg/dL (ref 6–20)
CO2: 27 mmol/L (ref 22–32)
Calcium: 9 mg/dL (ref 8.9–10.3)
Chloride: 104 mmol/L (ref 98–111)
Creatinine: 1.04 mg/dL (ref 0.61–1.24)
GFR, Estimated: >60 mL/min (ref >60)
Glucose, Bld: 109 mg/dL — ABNORMAL HIGH (ref 70–99)
Potassium: 3.1 mmol/L — ABNORMAL LOW (ref 3.5–5.1)
Sodium: 141 mmol/L (ref 135–145)
Total Bilirubin: 0.5 mg/dL (ref 0.3–1.2)
Total Protein: 6.9 g/dL (ref 6.5–8.1)

## 2020-11-23 LAB — CBC WITH DIFFERENTIAL (CANCER CENTER ONLY)
Abs Immature Granulocytes: 0.01 10*3/uL (ref 0.00–0.07)
Basophils Absolute: 0.1 10*3/uL (ref 0.0–0.1)
Basophils Relative: 1 %
Eosinophils Absolute: 0.5 10*3/uL (ref 0.0–0.5)
Eosinophils Relative: 8 %
HCT: 35.1 % — ABNORMAL LOW (ref 39.0–52.0)
Hemoglobin: 11.1 g/dL — ABNORMAL LOW (ref 13.0–17.0)
Immature Granulocytes: 0 %
Lymphocytes Relative: 26 %
Lymphs Abs: 1.6 10*3/uL (ref 0.7–4.0)
MCH: 28 pg (ref 26.0–34.0)
MCHC: 31.6 g/dL (ref 30.0–36.0)
MCV: 88.6 fL (ref 80.0–100.0)
Monocytes Absolute: 0.4 10*3/uL (ref 0.1–1.0)
Monocytes Relative: 7 %
Neutro Abs: 3.7 10*3/uL (ref 1.7–7.7)
Neutrophils Relative %: 58 %
Platelet Count: 324 10*3/uL (ref 150–400)
RBC: 3.96 MIL/uL — ABNORMAL LOW (ref 4.22–5.81)
RDW: 17 % — ABNORMAL HIGH (ref 11.5–15.5)
WBC Count: 6.3 10*3/uL (ref 4.0–10.5)
nRBC: 0 % (ref 0.0–0.2)

## 2020-11-23 LAB — RETICULOCYTES
Immature Retic Fract: 27 % — ABNORMAL HIGH (ref 2.3–15.9)
RBC.: 3.97 MIL/uL — ABNORMAL LOW (ref 4.22–5.81)
Retic Count, Absolute: 73.8 10*3/uL (ref 19.0–186.0)
Retic Ct Pct: 1.9 % (ref 0.4–3.1)

## 2020-11-23 LAB — ANTITHROMBIN III: AntiThromb III Func: 83 % (ref 75–120)

## 2020-11-23 LAB — IRON AND TIBC
Iron: 50 ug/dL (ref 42–163)
Saturation Ratios: 13 % — ABNORMAL LOW (ref 20–55)
TIBC: 381 ug/dL (ref 202–409)
UIBC: 331 ug/dL (ref 117–376)

## 2020-11-23 LAB — FERRITIN: Ferritin: 17 ng/mL — ABNORMAL LOW (ref 24–336)

## 2020-11-24 ENCOUNTER — Other Ambulatory Visit: Payer: Self-pay

## 2020-11-24 MED ORDER — SPIRONOLACTONE 25 MG PO TABS: ORAL_TABLET | ORAL | 5 refills | Status: DC

## 2020-11-25 ENCOUNTER — Telehealth: Payer: Self-pay | Admitting: *Deleted

## 2020-11-25 NOTE — Telephone Encounter (Signed)
Per scheduling message Drew [name] - called and gave upcoming appointments - confirmed (2) doses of IV Iron ?

## 2020-11-28 ENCOUNTER — Other Ambulatory Visit: Payer: Self-pay

## 2020-11-28 ENCOUNTER — Inpatient Hospital Stay: Payer: Federal, State, Local not specified - PPO

## 2020-11-28 VITALS — BP 108/62 | HR 66 | Temp 98.5°F | Resp 17

## 2020-11-28 DIAGNOSIS — Z7901 Long term (current) use of anticoagulants: Secondary | ICD-10-CM | POA: Diagnosis not present

## 2020-11-28 DIAGNOSIS — D509 Iron deficiency anemia, unspecified: Secondary | ICD-10-CM

## 2020-11-28 DIAGNOSIS — G4733 Obstructive sleep apnea (adult) (pediatric): Secondary | ICD-10-CM | POA: Diagnosis not present

## 2020-11-28 DIAGNOSIS — Z86711 Personal history of pulmonary embolism: Secondary | ICD-10-CM | POA: Diagnosis not present

## 2020-11-28 DIAGNOSIS — Z86718 Personal history of other venous thrombosis and embolism: Secondary | ICD-10-CM | POA: Diagnosis not present

## 2020-11-28 DIAGNOSIS — Z79899 Other long term (current) drug therapy: Secondary | ICD-10-CM | POA: Diagnosis not present

## 2020-11-28 MED ORDER — SODIUM CHLORIDE 0.9 % IV SOLN: Freq: Once | INTRAVENOUS | Status: AC

## 2020-11-28 MED ORDER — SODIUM CHLORIDE 0.9 % IV SOLN
510.0000 mg | Freq: Once | INTRAVENOUS | Status: AC
  Administered 2020-11-28: 510 mg via INTRAVENOUS
  Filled 2020-11-28: qty 17

## 2020-11-28 NOTE — Patient Instructions (Signed)

## 2020-12-05 ENCOUNTER — Other Ambulatory Visit: Payer: Self-pay

## 2020-12-05 ENCOUNTER — Inpatient Hospital Stay: Payer: Federal, State, Local not specified - PPO

## 2020-12-05 VITALS — BP 112/60 | HR 62 | Temp 98.5°F | Resp 18

## 2020-12-05 DIAGNOSIS — D509 Iron deficiency anemia, unspecified: Secondary | ICD-10-CM | POA: Diagnosis not present

## 2020-12-05 DIAGNOSIS — Z79899 Other long term (current) drug therapy: Secondary | ICD-10-CM | POA: Diagnosis not present

## 2020-12-05 DIAGNOSIS — Z86711 Personal history of pulmonary embolism: Secondary | ICD-10-CM | POA: Diagnosis not present

## 2020-12-05 DIAGNOSIS — Z7901 Long term (current) use of anticoagulants: Secondary | ICD-10-CM | POA: Diagnosis not present

## 2020-12-05 DIAGNOSIS — Z86718 Personal history of other venous thrombosis and embolism: Secondary | ICD-10-CM | POA: Diagnosis not present

## 2020-12-05 MED ORDER — SODIUM CHLORIDE 0.9 % IV SOLN
510.0000 mg | Freq: Once | INTRAVENOUS | Status: AC
  Administered 2020-12-05: 510 mg via INTRAVENOUS
  Filled 2020-12-05: qty 17

## 2020-12-05 MED ORDER — SODIUM CHLORIDE 0.9 % IV SOLN: Freq: Once | INTRAVENOUS | Status: AC

## 2020-12-05 NOTE — Patient Instructions (Signed)

## 2020-12-12 ENCOUNTER — Telehealth (HOSPITAL_COMMUNITY): Payer: Self-pay | Admitting: Surgery

## 2020-12-12 ENCOUNTER — Ambulatory Visit (HOSPITAL_COMMUNITY)
Admission: RE | Admit: 2020-12-12 | Discharge: 2020-12-12 | Disposition: A | Discharge status: Home / Self Care | Payer: Federal, State, Local not specified - PPO | Source: Ambulatory Visit | Attending: Cardiology | Admitting: Cardiology

## 2020-12-12 ENCOUNTER — Other Ambulatory Visit: Payer: Self-pay

## 2020-12-12 ENCOUNTER — Encounter (HOSPITAL_COMMUNITY): Payer: Self-pay | Admitting: Cardiology

## 2020-12-12 VITALS — BP 110/78 | HR 67 | Wt 367.8 lb

## 2020-12-12 DIAGNOSIS — I110 Hypertensive heart disease with heart failure: Secondary | ICD-10-CM | POA: Diagnosis not present

## 2020-12-12 DIAGNOSIS — G4733 Obstructive sleep apnea (adult) (pediatric): Secondary | ICD-10-CM | POA: Insufficient documentation

## 2020-12-12 DIAGNOSIS — Z7901 Long term (current) use of anticoagulants: Secondary | ICD-10-CM | POA: Insufficient documentation

## 2020-12-12 DIAGNOSIS — E785 Hyperlipidemia, unspecified: Secondary | ICD-10-CM | POA: Diagnosis not present

## 2020-12-12 DIAGNOSIS — Z6841 Body Mass Index (BMI) 40.0 and over, adult: Secondary | ICD-10-CM | POA: Insufficient documentation

## 2020-12-12 DIAGNOSIS — M170 Bilateral primary osteoarthritis of knee: Secondary | ICD-10-CM | POA: Insufficient documentation

## 2020-12-12 DIAGNOSIS — I483 Typical atrial flutter: Secondary | ICD-10-CM | POA: Insufficient documentation

## 2020-12-12 DIAGNOSIS — D509 Iron deficiency anemia, unspecified: Secondary | ICD-10-CM | POA: Diagnosis not present

## 2020-12-12 DIAGNOSIS — Z86718 Personal history of other venous thrombosis and embolism: Secondary | ICD-10-CM | POA: Diagnosis not present

## 2020-12-12 DIAGNOSIS — E876 Hypokalemia: Secondary | ICD-10-CM

## 2020-12-12 DIAGNOSIS — I428 Other cardiomyopathies: Secondary | ICD-10-CM | POA: Diagnosis not present

## 2020-12-12 DIAGNOSIS — I5022 Chronic systolic (congestive) heart failure: Secondary | ICD-10-CM | POA: Insufficient documentation

## 2020-12-12 DIAGNOSIS — Z86711 Personal history of pulmonary embolism: Secondary | ICD-10-CM | POA: Diagnosis not present

## 2020-12-12 DIAGNOSIS — Z87891 Personal history of nicotine dependence: Secondary | ICD-10-CM | POA: Insufficient documentation

## 2020-12-12 LAB — DIGOXIN LEVEL: Digoxin Level: 0.7 ng/mL — ABNORMAL LOW (ref 0.8–2.0)

## 2020-12-12 LAB — BASIC METABOLIC PANEL
Anion gap: 9 (ref 5–15)
BUN: 16 mg/dL (ref 6–20)
CO2: 28 mmol/L (ref 22–32)
Calcium: 8.8 mg/dL — ABNORMAL LOW (ref 8.9–10.3)
Chloride: 100 mmol/L (ref 98–111)
Creatinine, Ser: 1.31 mg/dL — ABNORMAL HIGH (ref 0.61–1.24)
GFR, Estimated: >60 mL/min (ref >60)
Glucose, Bld: 104 mg/dL — ABNORMAL HIGH (ref 70–99)
Potassium: 2.8 mmol/L — ABNORMAL LOW (ref 3.5–5.1)
Sodium: 137 mmol/L (ref 135–145)

## 2020-12-12 MED ORDER — CARVEDILOL 12.5 MG PO TABS: 12.5000 mg | ORAL_TABLET | Freq: Two times a day (BID) | ORAL | 4 refills | Status: DC

## 2020-12-12 MED ORDER — POTASSIUM CHLORIDE CRYS ER 20 MEQ PO TBCR: 40.0000 meq | EXTENDED_RELEASE_TABLET | Freq: Every day | ORAL | 6 refills | Status: DC

## 2020-12-12 NOTE — Patient Instructions (Signed)
INCREASE Coreg 12.5mg  (1 tab) twice a day  Labs today We will only contact you if something comes back abnormal or we need to make some changes. Otherwise no news is good news!  Your physician recommends that you schedule a follow-up appointment in: 3 months with the Nurse Practitioner/Physician Assistant  Please call office at [phone] option 2 if you have any questions or concerns.   Do the following things EVERYDAY: Weigh yourself in the morning before breakfast. Write it down and keep it in a log. Take your medicines as prescribed Eat low salt foods--Limit salt (sodium) to 2000 mg per day.  Stay as active as you can everyday Limit all fluids for the day to less than 2 liters  At the [facility], you and your health needs are our priority. As part of our continuing mission to provide you with exceptional heart care, we have created designated Provider Care Teams. These Care Teams include your primary Cardiologist (physician) and Advanced Practice Providers (APPs- Physician Assistants and Nurse Practitioners) who all work together to provide you with the care you need, when you need it.   You may see any of the following providers on your designated Care Team at your next follow up: Dr Arvilla Meres Dr Carron Curie, NP Robbie Lis, [place] [facility] [place], [place] Karle Plumber, PharmD   Please be sure to bring in all your medications bottles to every appointment.

## 2020-12-12 NOTE — Telephone Encounter (Signed)
I called patient and reviewed results as well as recommendations per Dr. Shirlee Latch.  I have updated medication list, reviewed with patient to take Potassium 80 meq po today and begin 40 meq daily po tomorrow as well as sent prescription to pharmacy of choice.  Repeat lab work appt scheduled for Monday Dec 5th at 10:30AM.

## 2020-12-12 NOTE — Telephone Encounter (Signed)
-----   Message from Laurey Morale, MD sent at 12/12/2020 12:17 PM EST ----- Take KCl 80 mEq today, then start KCl 40 daily tomorrow.  BMET 1 week.

## 2020-12-12 NOTE — Progress Notes (Signed)
[facility] Note    PCP: Esperanza Richters, PA-C Primary Cardiologist: Dr Servando Salina  HF Cardiologist: Dr. Shirlee Latch  HPI: 60 y.o. w/ h/o HTN, HLD, obesity, severe knee OA, tobacco use and NICM, first diagnosed in 05/2018 when admitted for acute CHF. Echo showed LVEF 20-25%. RV was moderately reduced. LHC showed normal cors.    Had 2D echo on 02/29/20 showing EF 25-30%. RV mild-moderately reduced.    Seen at [facility] (03/03/20) w/ complaints of 4 day h/o increased dyspnea w/ hypoxia and pleuritic chest pain. Noted to be in sinus tach w/ HR in the 130s. Given concern for PE, he was referred to [facility] ED where CT angio confirmed Rt middle and lower lobe PE. No evidence of RV strain. Admitted and started on IV heparin. LE Venous doppler + for Lt DVT.  Echo done (03/04/20) RV moderately enlarged w/ mild-moderately reduced systolic function. Not significantly changed from echo 02/29/20. BNP also normal at 27. He remained hemodynamically stable. Hospitalization c/b atrial flutter, s/p successful TEE with DCCV (03/07/20). He was discharged on GDMT, discharge weight 360 lbs.   Echo in 5/22 showed EF 35-40%, global hypokinesis, mild RVE with normal function, PASP 31 mmHg.   Today he returns for HF follow up.  Weight down 15 lbs, he has started semaglutide.  He saw orthopedics and had knee injection with some improvement.  Still has activity limited by knee and back pain. No dyspnea walking into the office, but gets short of breath walking longer distances.  He used his CPAP machine once but says that the "setting is too strong" and he cannot sleep with it.   ECG (personally reviewed): NSR, nonspecific T wave changes.   Labs (3/22): K 4, creatinine 1.3 Labs (8/22): K 3.7, creatinine 1.13, hgb 9.9, transferrin saturation 8% Labs (11/22): K 3.1, creatinine 1.04, hgb 11.1  PMH: 1. HTN 2. Hyperlipidemia 3. Hypothyroidism 4. OA: Knees 5. Prior smoker 6. Venous thromboembolism: Left DVT with PE  in 2/22.  7. Atrial flutter: 2/22, s/p TEE-guided DCCV.  8. Chronic systolic CHF: Nonischemic cardiomyopathy.  - Echo (5/20): EF 20-25%.  - LHC/RHC (5/20): No significant CAD. Mean RA 6, PA 41/13, mean PCWP 20, CI 3.21.  - TEE (2/22): EF 20-25%, moderate LV dilation, mild RV enlargement with moderately decreased systolic function.  - Cardiac MRI (2/22): Moderate LV dilation with EF 42%, moderate RV dilation with EF 42%, nonspecific RV insertion site LGE.  - Echo (5/22): EF 35-40%, global hypokinesis, mild RVE with normal function, PASP 31 mmHg.  9. Obesity 10. Gout 11. Fe deficiency anemia 12. OSA  ROS: All systems negative except as listed in HPI, PMH and Problem List.  SH:  Social History   Socioeconomic History   Marital status: Married    Spouse name: Not on file   Number of children: Not on file   Years of education: Not on file   Highest education level: Not on file  Occupational History   Not on file  Tobacco Use   Smoking status: Former    Packs/day: 1.00    Years: 20.00    Pack years: 20.00    Types: Cigarettes    Quit date: 05/23/2018    Years since quitting: 2.5   Smokeless tobacco: Never  Vaping Use   Vaping Use: Never used  Substance and Sexual Activity   Alcohol use: Yes    Alcohol/week: 0.0 standard drinks    Comment: 6 pack a week spread out over weekend  Drug use: No   Sexual activity: Yes  Other Topics Concern   Not on file  Social History Narrative   [employer].   Married lives with wife, mother in law and daughter.  7 children.     Social Determinants of Health   Financial Resource Strain: High Risk   Difficulty of Paying Living Expenses: Hard  Food Insecurity: No Food Insecurity   Worried About Programme researcher, broadcasting/film/video in the Last Year: Never true   Ran Out of Food in the Last Year: Never true  Transportation Needs: No Transportation Needs   Lack of Transportation (Medical): No   Lack of Transportation (Non-Medical): No  Physical Activity: Not on  file  Stress: Not on file  Social Connections: Not on file  Intimate Partner Violence: Not on file   FH:  Family History  Problem Relation Age of Onset   Hearing loss Mother    Cancer Father    Heart failure Other    Heart failure Paternal Grandfather    Current Outpatient Medications  Medication Sig Dispense Refill   albuterol (PROVENTIL HFA;VENTOLIN HFA) 108 (90 Base) MCG/ACT inhaler Inhale 2 puffs into the lungs every 6 (six) hours as needed for wheezing or shortness of breath. 1 Inhaler 2   allopurinol (ZYLOPRIM) 100 MG tablet TAKE 1 TABLET(100 MG) BY MOUTH DAILY 90 tablet 0   apixaban (ELIQUIS) 5 MG TABS tablet TAKE 1 TABLET(5 MG) BY MOUTH TWICE DAILY 180 tablet 1   digoxin (LANOXIN) 0.125 MG tablet Take 1 tablet (0.125 mg total) by mouth daily. 90 tablet 3   FARXIGA 10 MG TABS tablet TAKE 1 TABLET(10 MG) BY MOUTH DAILY 30 tablet 2   levothyroxine (SYNTHROID) 25 MCG tablet Take 1 tablet (25 mcg total) by mouth daily before breakfast. 30 tablet 11   sacubitril-valsartan (ENTRESTO) 97-103 MG Take 1 tablet by mouth 2 (two) times daily. 60 tablet 6   Semaglutide, 1 MG/DOSE, (OZEMPIC, 1 MG/DOSE,) 4 MG/3ML SOPN Inject 1 mg into the skin once a week. 3 mL 1   spironolactone (ALDACTONE) 25 MG tablet TAKE 1 TABLET(25 MG) BY MOUTH DAILY 30 tablet 5   torsemide (DEMADEX) 20 MG tablet TAKE 4 TABLETS (80 MG) BY MOUTH DAILY. 120 tablet 6   carvedilol (COREG) 12.5 MG tablet Take 1 tablet (12.5 mg total) by mouth 2 (two) times daily with a meal. 60 tablet 4   No current facility-administered medications for this encounter.    Vitals:   12/12/20 0909  BP: 110/78  Pulse: 67  SpO2: 96%  Weight: (!) 166.8 kg (367 lb 12.8 oz)   Wt Readings from Last 3 Encounters:  12/12/20 (!) 166.8 kg (367 lb 12.8 oz)  10/04/20 (!) 169.9 kg (374 lb 9.6 oz)  09/25/20 (!) 172.8 kg (381 lb)   PHYSICAL EXAM: General: NAD, obese Neck: No JVD, no thyromegaly or thyroid nodule.  Lungs: Clear to auscultation  bilaterally with normal respiratory effort. CV: Nondisplaced PMI.  Heart regular S1/S2, no S3/S4, no murmur.  Trace edema.  No carotid bruit.  Normal pedal pulses.  Abdomen: Soft, nontender, no hepatosplenomegaly, no distention.  Skin: Intact without lesions or rashes.  Neurologic: Alert and oriented x 3.  Psych: Normal affect. Extremities: No clubbing or cyanosis.  HEENT: Normal.   ASSESSMENT & PLAN: 1. PE possibly with infarction and LLE DVT: 2/22, unprovoked.  Patient has no FH of VTE.  He had not been sedentary, had been working full time at the [employer]. Echo in 2/22  with moderate RV dilation/moderate dysfunction. Echo in 5/22 showed mild RV enlargement, normal RV function.  - Continue Eliquis long-term.  2. Atrial flutter: Typical flutter.  This was noted at when he was admitted for PE in 2/22.  Suspect this was triggered by PE.  DCCV in 2/22. He is in NSR today, no longer taking amiodarone.  - Continue anticoagulation therapy w/ Eliquis.    - If recurrence, should have atrial flutter ablation.   3. Chronic systolic CHF: Diagnosed in 2020.  Cath from 5/20 with no significant CAD.  Cause of CMP is uncertain, nonischemic cardiomyopathy. ?Viral myocarditis.  02/2020 cMRI LV/RV EF 42% with only nonspecific RV insertion site LGE.  Echo in 5/22 showed EF 35-40% with mild RVE and normal RV function.  Not volume overloaded on exam, but has NYHA class III symptoms, likely worsened by knee and back pain which limit exertion as well.  - Increase Coreg to 12.5 mg bid.  - Continue spironolactone 25 mg daily. BMET today.  - Continue digoxin 0.125, check level today.  - Continue Entresto 97/103 bid.  - Continue torsemide 80 mg daily. - Continue dapagliflozin 10 mg daily.  - EF appears just out of range for ICD.  Narrow QRS so not candidate for CRT.  4. Obesity: Body mass index is 51.3 kg/m.  Obesity is a major contributor to his knee pain and may contribute to his cardiomyopathy.  [facility] clinic was going to be too expensive.   - He has started semaglutide and has been losing weight.  5. OSA: Unable to tolerate current CPAP settings, will discuss with Dr. Mayford Knife.  6. Fe deficiency anemia: Transferrin saturation 8%, has had Fe infusion. Needs GI evaluation for colonoscopy (has not had).  7. Knee OA: Knee pain is a major limitor.  He is not a surgical candidate at this time due to morbid obesity.  - Seeing orthopedics (Dr. August Saucer).    F/u APP 3 months.    [name] 12/12/2020

## 2020-12-13 ENCOUNTER — Ambulatory Visit (INDEPENDENT_AMBULATORY_CARE_PROVIDER_SITE_OTHER): Payer: Federal, State, Local not specified - PPO | Admitting: Pharmacist

## 2020-12-13 ENCOUNTER — Other Ambulatory Visit (HOSPITAL_BASED_OUTPATIENT_CLINIC_OR_DEPARTMENT_OTHER): Payer: Self-pay

## 2020-12-13 ENCOUNTER — Telehealth: Payer: Self-pay | Admitting: *Deleted

## 2020-12-13 ENCOUNTER — Encounter: Payer: Self-pay | Admitting: Pharmacist

## 2020-12-13 VITALS — BP 114/60 | HR 64 | Ht 71.0 in | Wt 367.2 lb

## 2020-12-13 DIAGNOSIS — G4733 Obstructive sleep apnea (adult) (pediatric): Secondary | ICD-10-CM

## 2020-12-13 MED ORDER — SEMAGLUTIDE-WEIGHT MANAGEMENT 1.7 MG/0.75ML ~~LOC~~ SOAJ
1.7000 mg | SUBCUTANEOUS | 0 refills | Status: DC
  Filled 2020-12-13: qty 3, 28d supply, fill #0

## 2020-12-13 MED ORDER — SEMAGLUTIDE-WEIGHT MANAGEMENT 2.4 MG/0.75ML ~~LOC~~ SOAJ
2.4000 mg | SUBCUTANEOUS | 11 refills | Status: DC
  Filled 2020-12-13: qty 3, fill #0

## 2020-12-13 MED ORDER — OZEMPIC (2 MG/DOSE) 8 MG/3ML ~~LOC~~ SOPN
2.0000 mg | PEN_INJECTOR | SUBCUTANEOUS | 11 refills | Status: DC
  Filled 2020-12-13: qty 3, 28d supply, fill #0
  Filled 2021-01-12: qty 3, 28d supply, fill #1
  Filled 2021-02-13: qty 3, 28d supply, fill #2

## 2020-12-13 NOTE — Telephone Encounter (Signed)
-----   Message from Quintella Reichert, MD sent at 12/12/2020  4:28 PM EST ----- Coralee North  Please change patient's CPAP to auto from 4 to 20cm H2O and get a download in 2 weeks  Traci ----- Message ----- From: Laurey Morale, MD Sent: 12/12/2020   2:10 PM EST To: Quintella Reichert, MD  This patient cannot tolerate CPAP at current settings, says it "blows too hard" for him to sleep.  Needs order from you to adjust apparently.

## 2020-12-13 NOTE — Progress Notes (Signed)
Patient ID: Drew [name]                 DOB: 1960-09-18                    MRN: [id number]     HPI: Drew [name] is a 60 y.o. male patient referred to pharmacy clinic by Dr. Shirlee Latch to initiate weight loss therapy with GLP1-RA. PMH is significant for obesity complicated by chronic medical conditions including  HTN, HLD, obesity, severe knee OA, hx PE, iron deficiency, tobacco use and NICM. Most recent BMI 51.21, previous BMI 53.14mg /m2.  Patient presents today for his 3 month follow up. He is currently on ozempic 1mg  weekly. He has 1 or 2 more injections before he increased to [facility] 1.7mg  weekly. He has had no missed doses and no adverse effects. He is not exercising due to back and knee pain. Apply for disability. Hasn't worked in almost a year. He has lost 14 pounds. 3.67% of his body weight. He is eating about 2 meals a day, smaller portions. He tires not to eat dinner too late, but finds himself not eating breakfast/lunch until 11:00 and then he isn't hungry again till 9 PM. Not drinking as much lemonade and only drinking alcohol on Sunday (2-3 beers). Drinking more water.   Current weight management medications: none  Previously tried meds:none  Current meds that may affect weight: levothyroxine  Baseline weight/BMI: 381lb/53.14kg/m2  Insurance payor: [employer] FEP  Diet:  -Breakfast: nothing -Lunch: sometimes he eats sometimes he doesn't (eats 1-2 meals per day) Cobb salad from chicken fil A -Dinner: steak and eggs biscuits, baked chicken, spaghetti, Friday, peas, potatoes, green beans, broccoli rhab -Snacks: devil eggs, mike and ikes -Drinks: lemonade, water, 2-3 beers on Sunday  Exercise: none  Family History:  Family History  Problem Relation Age of Onset   Hearing loss Mother    Cancer Father    Heart failure Other    Heart failure Paternal Grandfather     Social History: former smoker- quite in 2020, 2-3 beers on Sunday  Labs: Lab Results  Component Value Date    HGBA1C 6.5 04/28/2020    Wt Readings from Last 1 Encounters:  12/12/20 (!) 367 lb 12.8 oz (166.8 kg)    BP Readings from Last 1 Encounters:  12/12/20 110/78   Pulse Readings from Last 1 Encounters:  12/12/20 67       Component Value Date/Time   CHOL 152 05/25/2018 0343   TRIG 69 05/25/2018 0343   HDL 36 (L) 05/25/2018 0343   CHOLHDL 4.2 05/25/2018 0343   VLDL 14 05/25/2018 0343   LDLCALC 102 (H) 05/25/2018 0343    Past Medical History:  Diagnosis Date   Acute CHF (congestive heart failure) (HCC) 05/24/2018   Acute on chronic combined systolic and diastolic CHF (congestive heart failure) (HCC)    Acute systolic HF (heart failure) (HCC) 06/02/2018   AKI (acute kidney injury) (HCC) 03/03/2020   Alcohol use 05/24/2018   Allergy    Chronic systolic CHF (congestive heart failure) (HCC) 03/03/2020   Dilated cardiomyopathy (HCC)    Essential hypertension 05/24/2018   HFrEF (heart failure with reduced ejection fraction) (HCC) 11/20/2019   HTN (hypertension)    Hyperlipidemia 05/24/2018   Hypokalemia 03/03/2020   IDA (iron deficiency anemia) 08/23/2020   Knee pain, left 02/11/2014   New onset of congestive heart failure (HCC) 05/23/2018   Nonischemic cardiomyopathy (HCC) 11/20/2019   Obesity, Class III, BMI 40-49.9 (  morbid obesity) (HCC) 05/24/2018   Pulmonary embolism (HCC) 03/03/2020   Seasonal allergies 02/11/2014   Tobacco abuse 05/24/2018   Typical atrial flutter (HCC)    Wellness examination 02/26/2014    Current Outpatient Medications on File Prior to Visit  Medication Sig Dispense Refill   albuterol (PROVENTIL HFA;VENTOLIN HFA) 108 (90 Base) MCG/ACT inhaler Inhale 2 puffs into the lungs every 6 (six) hours as needed for wheezing or shortness of breath. 1 Inhaler 2   allopurinol (ZYLOPRIM) 100 MG tablet TAKE 1 TABLET(100 MG) BY MOUTH DAILY 90 tablet 0   apixaban (ELIQUIS) 5 MG TABS tablet TAKE 1 TABLET(5 MG) BY MOUTH TWICE DAILY 180 tablet 1   carvedilol (COREG) 12.5 MG tablet Take 1  tablet (12.5 mg total) by mouth 2 (two) times daily with a meal. 60 tablet 4   digoxin (LANOXIN) 0.125 MG tablet Take 1 tablet (0.125 mg total) by mouth daily. 90 tablet 3   FARXIGA 10 MG TABS tablet TAKE 1 TABLET(10 MG) BY MOUTH DAILY 30 tablet 2   levothyroxine (SYNTHROID) 25 MCG tablet Take 1 tablet (25 mcg total) by mouth daily before breakfast. 30 tablet 11   potassium chloride SA (KLOR-CON) 20 MEQ tablet Take 2 tablets (40 mEq total) by mouth daily. 60 tablet 6   sacubitril-valsartan (ENTRESTO) 97-103 MG Take 1 tablet by mouth 2 (two) times daily. 60 tablet 6   Semaglutide, 1 MG/DOSE, (OZEMPIC, 1 MG/DOSE,) 4 MG/3ML SOPN Inject 1 mg into the skin once a week. 3 mL 1   spironolactone (ALDACTONE) 25 MG tablet TAKE 1 TABLET(25 MG) BY MOUTH DAILY 30 tablet 5   torsemide (DEMADEX) 20 MG tablet TAKE 4 TABLETS (80 MG) BY MOUTH DAILY. 120 tablet 6   No current facility-administered medications on file prior to visit.    Allergies  Allergen Reactions   Tetracyclines & Related Hives and Itching     Assessment/Plan:  1. Weight loss - Patient has had success with semaglutide. He has lost 3.6% of body weight. Not yet on max dose of Wegovy. He is tolerating well. We discussed the importance of being physically active. Finish out supply of Ozempic 1g weekly then increase to 1.7mg  Wegovy weekly x 4 weeks. Then increase to 2.4mg  weekly. I reviewed how to use the [facility] pen and Rx for both strengths sent to pharmacy. Follow up in 3 months in person.  Thank you,  Olene Floss, Pharm.D, BCPS, CPP [facility]  [address] [place], [place] [address]  Phone: [phone]; Fax: [phone]

## 2020-12-13 NOTE — Patient Instructions (Addendum)
Finish out your supply of Ozempic 1mg  and then start taking Wegovy 1.7mg  weekly for 4 weeks then increase to 2.4mg  weekly  https://www.[employer].com/wegovy/savings-card.html?src=100002016  GLP-1 Receptor Agonist Counseling Points This medication reduces your appetite and may make you feel fuller longer.  Stop eating when your body tells you that you are full. This will likely happen sooner than you are used to. Store your medication in the fridge until you are ready to use it. Inject your medication in the fatty tissue of your lower abdominal area (2 inches away from belly button) or upper outer thigh. Rotate injection sites. Each pen will last you about 1 month (the first month it will last a few weeks longer). Use a different needle with each weekly injection. Common side effects include: nausea, diarrhea/constipation, and heartburn, and are more likely to occur if you overeat.  Tips for living a healthier life     Building a Healthy and Balanced Diet Make most of your meal vegetables and fruits -  of your plate. Aim for color and variety, and remember that potatoes don't count as vegetables on the Healthy Eating Plate because of their negative impact on blood sugar.  Go for whole grains -  of your plate. Whole and intact grains--whole wheat, barley, wheat berries, quinoa, oats, brown rice, and foods made with them, such as whole wheat pasta--have a milder effect on blood sugar and insulin than white bread, white rice, and other refined grains.  Protein power -  of your plate. Fish, poultry, beans, and nuts are all healthy, versatile protein sources--they can be mixed into salads, and pair well with vegetables on a plate. Limit red meat, and avoid processed meats such as bacon and sausage.  Healthy plant oils - in moderation. Choose healthy vegetable oils like olive, canola, soy, corn, sunflower, peanut, and others, and avoid partially hydrogenated oils, which contain unhealthy trans  fats. Remember that low-fat does not mean "healthy."  Drink water, coffee, or tea. Skip sugary drinks, limit milk and dairy products to one to two servings per day, and limit juice to a small glass per day.  Stay active. The red figure running across the Healthy Eating Plate's placemat is a reminder that staying active is also important in weight control.  The main message of the Healthy Eating Plate is to focus on diet quality:  The type of carbohydrate in the diet is more important than the amount of carbohydrate in the diet, because some sources of carbohydrate--like vegetables (other than potatoes), fruits, whole grains, and beans--are healthier than others. The Healthy Eating Plate also advises consumers to avoid sugary beverages, a major source of calories--usually with little nutritional value--in the American diet. The Healthy Eating Plate encourages consumers to use healthy oils, and it does not set a maximum on the percentage of calories people should get each day from healthy sources of fat. In this way, the Healthy Eating Plate recommends the opposite of the low-fat message promoted for decades by the USDA.   SUGAR  Sugar is a huge problem in the modern day diet. Sugar is a big contributor to heart disease, diabetes, high triglyceride levels, fatty liver disease and obesity. Sugar is hidden in almost all packaged foods/beverages. Added sugar is extra sugar that is added beyond what is naturally found and has no nutritional benefit for your body. The American Heart Association recommends limiting added sugars to no more than 25g for women and 36 grams for men per day. There are many names  for sugar including maltose, sucrose (names ending in "ose"), high fructose corn syrup, molasses, cane sugar, corn sweetener, raw sugar, syrup, honey or fruit juice concentrate.   One of the best ways to limit your added sugars is to  stop drinking sweetened beverages such as soda, sweet tea, and fruit juice.  There is 65g of added sugars in one 20oz bottle of Coke! That is equal to 7.5 donuts.   Pay attention and read all nutrition facts labels. Below is an examples of a nutrition facts label. The #1 is showing you the total sugars where the # 2 is showing you the added sugars. This one serving has almost the max amount of added sugars per day!     20 oz Soda 65g Sugar = 7.5 Glazed Donuts  16oz Energy  Drink 54g Sugar = 6.5 Glazed Donuts  Large Sweet  Tea 38g Sugar = 4 Glazed Donuts  20oz Sports  Drink 34g Sugar = 3.5 Glazed Donuts  8oz Chocolate Milk 24g Sugar =2.5 Glazed Donuts  8oz Orange  Juice 21g Sugar = 2 Glazed Donuts  1 Juice Box 14g Sugar = 1.5 Glazed Donuts  16oz Water= NO SUGAR!!  EXERCISE  Exercise is good. We've all heard that. In an ideal world, we would all have time and resources to get plenty of it. When you are active, your heart pumps more efficiently and you will feel better.  Multiple studies show that even walking regularly has benefits that include living a longer life. The American Heart Association recommends 150 minutes per week of exercise (30 minutes per day most days of the week). You can do this in any increment you wish. Nine or more 10-minute walks count. So does an hour-long exercise class. Break the time apart into what will work in your life. Some of the best things you can do include walking briskly, jogging, cycling or swimming laps. Not everyone is ready to "exercise." Sometimes we need to start with just getting active. Here are some easy ways to be more active throughout the day:  Take the stairs instead of the elevator  Go for a 10-15 minute walk during your lunch break (find a friend to make it more enjoyable)  When shopping, park at the back of the parking lot  If you take public transportation, get off one stop early and walk the extra distance  Pace around  while making phone calls  Check with your doctor if you aren't sure what your limitations may be. Always remember to drink plenty of water when doing any type of exercise. Don't feel like a failure if you're not getting the 90-150 minutes per week. If you started by being a couch potato, then just a 10-minute walk each day is a huge improvement. Start with little victories and work your way up.   HEALTHY EATING TIPS  When looking to improve your eating habits, whether to lose weight, lower blood pressure or just be healthier, it helps to know what a serving size is.   Grains 1 slice of bread,  bagel,  cup pasta or rice  Vegetables 1 cup fresh or raw vegetables,  cup cooked or canned Fruits 1 piece of medium sized fruit,  cup canned,   Meats/Proteins  cup dried       1 oz meat, 1 egg,  cup cooked beans, nuts or seeds  Dairy        Fats Individual yogurt container, 1 cup (8oz)    1 teaspoon  margarine/butter or vegetable  milk or milk alternative, 1 slice of cheese          oil; 1 tablespoon mayonnaise or salad dressing                  Plan ahead: make a menu of the meals for a week then create a grocery list to go with that menu. Consider meals that easily stretch into a night of leftovers, such as stews or casseroles. Or consider making two of your favorite meal and put one in the freezer for another night. Try a night or two each week that is "meatless" or "no cook" such as salads. When you get home from the grocery store wash and prepare your vegetables and fruits. Then when you need them they are ready to go.   Tips for going to the grocery store:  Buy store or generic brands  Check the weekly ad from your store on-line or in their in-store flyer  Look at the unit price on the shelf tag to compare/contrast the costs of different items  Buy fruits/vegetables in season  Carrots, bananas and apples are low-cost, naturally healthy items  If meats or frozen vegetables are on sale, buy  some extras and put in your freezer  Limit buying prepared or "ready to eat" items, even if they are pre-made salads or fruit snacks  Do not shop when you're hungry  Foods at eye level tend to be more expensive. Look on the high and low shelves for deals.  Consider shopping at the farmer's market for fresh foods in season.  Avoid the cookie and chip aisles (these are expensive, high in calories and low in nutritional value). Shop on the outside of the grocery store.  Healthy food preparations:  If you can't get lean hamburger, be sure to drain the fat when cooking  Steam, saut (in olive oil), grill or bake foods  Experiment with different seasonings to avoid adding salt to your foods. Kosher salt, sea salt and Himalayan salt are all still salt and should be avoided. Try seasoning food with onion, garlic, thyme, rosemary, basil ect. Onion powder or garlic powder is ok. Avoid if it says salt (ie garlic salt).

## 2020-12-13 NOTE — Telephone Encounter (Signed)
Order placed to [facility] to change patient's CPAP to auto from 4 to 20cm H2O and get a download in 2 weeks

## 2020-12-19 ENCOUNTER — Other Ambulatory Visit: Payer: Self-pay

## 2020-12-19 ENCOUNTER — Ambulatory Visit (HOSPITAL_COMMUNITY)
Admission: RE | Admit: 2020-12-19 | Discharge: 2020-12-19 | Disposition: A | Discharge status: Home / Self Care | Payer: Federal, State, Local not specified - PPO | Source: Ambulatory Visit | Attending: Cardiology | Admitting: Cardiology

## 2020-12-19 DIAGNOSIS — E876 Hypokalemia: Secondary | ICD-10-CM | POA: Insufficient documentation

## 2020-12-19 LAB — BASIC METABOLIC PANEL
Anion gap: 7 (ref 5–15)
BUN: 10 mg/dL (ref 6–20)
CO2: 28 mmol/L (ref 22–32)
Calcium: 9 mg/dL (ref 8.9–10.3)
Chloride: 104 mmol/L (ref 98–111)
Creatinine, Ser: 1.17 mg/dL (ref 0.61–1.24)
GFR, Estimated: >60 mL/min (ref >60)
Glucose, Bld: 98 mg/dL (ref 70–99)
Potassium: 3.5 mmol/L (ref 3.5–5.1)
Sodium: 139 mmol/L (ref 135–145)

## 2020-12-27 ENCOUNTER — Telehealth: Payer: Federal, State, Local not specified - PPO | Admitting: Cardiology

## 2020-12-28 DIAGNOSIS — G4733 Obstructive sleep apnea (adult) (pediatric): Secondary | ICD-10-CM | POA: Diagnosis not present

## 2021-01-04 ENCOUNTER — Other Ambulatory Visit: Payer: Self-pay | Admitting: Medical

## 2021-01-13 ENCOUNTER — Encounter: Payer: Self-pay | Admitting: Pharmacist

## 2021-01-13 ENCOUNTER — Other Ambulatory Visit (HOSPITAL_BASED_OUTPATIENT_CLINIC_OR_DEPARTMENT_OTHER): Payer: Self-pay

## 2021-01-23 ENCOUNTER — Telehealth: Payer: Self-pay | Admitting: *Deleted

## 2021-01-23 NOTE — Telephone Encounter (Signed)
Drew [name] you have a video appt with dr Radford Pax on Thursday and your cpap stopped 10/28/20. I wanted to speak to you about that. Please call Gae Bon at [phone] up to 6 pm.

## 2021-01-26 ENCOUNTER — Encounter: Payer: Self-pay | Admitting: Cardiology

## 2021-01-26 ENCOUNTER — Telehealth (INDEPENDENT_AMBULATORY_CARE_PROVIDER_SITE_OTHER): Payer: Federal, State, Local not specified - PPO | Admitting: Cardiology

## 2021-01-26 VITALS — BP 130/67 | HR 57 | Ht 71.0 in | Wt 364.0 lb

## 2021-01-26 DIAGNOSIS — I10 Essential (primary) hypertension: Secondary | ICD-10-CM | POA: Diagnosis not present

## 2021-01-26 DIAGNOSIS — G4733 Obstructive sleep apnea (adult) (pediatric): Secondary | ICD-10-CM | POA: Diagnosis not present

## 2021-01-26 NOTE — Addendum Note (Signed)
Addended by: Theresia Majors on: 01/26/2021 08:45 AM   Modules accepted: Orders

## 2021-01-26 NOTE — Progress Notes (Signed)
Virtual Visit via Video Note   This visit type was conducted due to national recommendations for restrictions regarding the COVID-19 Pandemic (e.g. social distancing) in an effort to limit this patient's exposure and mitigate transmission in our community.  Due to his co-morbid illnesses, this patient is at least at moderate risk for complications without adequate follow up.  This format is felt to be most appropriate for this patient at this time.  All issues noted in this document were discussed and addressed.  A limited physical exam was performed with this format.  Please refer to the patient's chart for his consent to telehealth for [facility].    Date:  01/26/2021   ID:  Drew [name], DOB 11-27-1960, MRN [id number] The patient was identified using 2 identifiers.  Patient Location: Home Provider Location: Home Office   PCP:  Saguier, Ramon Dredge, PA-C   [facility] Providers Cardiologist:  Thomasene Ripple, DO     Evaluation Performed:  Follow-Up Visit  Chief Complaint:  OSA  History of Present Illness:    Drew [name] is a 61 y.o. male with chronic combined systolic CHF, dilated cardiomyopathy, hypertension, hyperlipidemia, obesity, nonischemic cardiomyopathy and pulmonary embolism who was referred by Dr. Shirlee Latch for evaluation of possible obstructive sleep apnea.  He underwent home sleep study showing severe obstructive sleep apnea with an AHI of 61/h and no significant central sleep apnea.  He had nocturnal hypoxemia with O2 sats less than 88% for 23 minutes of total sleep time.  He underwent CPAP titration to 17 cm H2O as well as O2 at 2 L nightly via CPAP.  He is now here for follow-up.  He tells me he tried to start the CPAP and the pressure was too high so he stopped using it. He says that when he started using it he would take the mask off and would start choking really bad and had to use some Robitussin.  He tried it last night and did not have those problems. He says  that he only sleeps about 2-4 hours at night and then sleeps during the day and says that has sleep schedule is very messed up.  He only used it 3 times since starting in September and was changed to auto CPAP in December and used it last night and things went well.  He denied any mouth or nose dryness or nasal congestion or cough this am.    The patient does not have symptoms concerning for COVID-19 infection (fever, chills, cough, or new shortness of breath).    Past Medical History:  Diagnosis Date   Acute CHF (congestive heart failure) (HCC) 05/24/2018   Acute on chronic combined systolic and diastolic CHF (congestive heart failure) (HCC)    Acute systolic HF (heart failure) (HCC) 06/02/2018   AKI (acute kidney injury) (HCC) 03/03/2020   Alcohol use 05/24/2018   Allergy    Chronic systolic CHF (congestive heart failure) (HCC) 03/03/2020   Dilated cardiomyopathy (HCC)    Essential hypertension 05/24/2018   HFrEF (heart failure with reduced ejection fraction) (HCC) 11/20/2019   HTN (hypertension)    Hyperlipidemia 05/24/2018   Hypokalemia 03/03/2020   IDA (iron deficiency anemia) 08/23/2020   Knee pain, left 02/11/2014   New onset of congestive heart failure (HCC) 05/23/2018   Nonischemic cardiomyopathy (HCC) 11/20/2019   Obesity, Class III, BMI 40-49.9 (morbid obesity) (HCC) 05/24/2018   Pulmonary embolism (HCC) 03/03/2020   Seasonal allergies 02/11/2014   Tobacco abuse 05/24/2018   Typical atrial flutter (  HCC)    Wellness examination 02/26/2014   Past Surgical History:  Procedure Laterality Date   CARDIOVERSION N/A 03/07/2020   Procedure: CARDIOVERSION;  Surgeon: Laurey Morale, MD;  Location: [facility] ENDOSCOPY;  Service: Cardiovascular;  Laterality: N/A;   KNEE ARTHROSCOPY  2009   RIGHT/LEFT HEART CATH AND CORONARY ANGIOGRAPHY N/A 05/26/2018   Procedure: RIGHT/LEFT HEART CATH AND CORONARY ANGIOGRAPHY;  Surgeon: Lyn Records, MD;  Location: [facility] INVASIVE CV LAB;  Service: Cardiovascular;  Laterality: N/A;    TEE WITHOUT CARDIOVERSION N/A 03/07/2020   Procedure: TRANSESOPHAGEAL ECHOCARDIOGRAM (TEE);  Surgeon: Laurey Morale, MD;  Location: [facility] ENDOSCOPY;  Service: Cardiovascular;  Laterality: N/A;   THROAT SURGERY  2005     Current Meds  Medication Sig   albuterol (PROVENTIL HFA;VENTOLIN HFA) 108 (90 Base) MCG/ACT inhaler Inhale 2 puffs into the lungs every 6 (six) hours as needed for wheezing or shortness of breath.   allopurinol (ZYLOPRIM) 100 MG tablet TAKE 1 TABLET(100 MG) BY MOUTH DAILY   apixaban (ELIQUIS) 5 MG TABS tablet TAKE 1 TABLET(5 MG) BY MOUTH TWICE DAILY   carvedilol (COREG) 12.5 MG tablet Take 1 tablet (12.5 mg total) by mouth 2 (two) times daily with a meal.   digoxin (LANOXIN) 0.125 MG tablet Take 1 tablet (0.125 mg total) by mouth daily.   FARXIGA 10 MG TABS tablet TAKE 1 TABLET(10 MG) BY MOUTH DAILY   levothyroxine (SYNTHROID) 25 MCG tablet TAKE 1 TABLET BY MOUTH EVERY DAY BEFORE BREAKFAST   potassium chloride SA (KLOR-CON) 20 MEQ tablet Take 2 tablets (40 mEq total) by mouth daily.   sacubitril-valsartan (ENTRESTO) 97-103 MG Take 1 tablet by mouth 2 (two) times daily.   Semaglutide, 2 MG/DOSE, (OZEMPIC, 2 MG/DOSE,) 8 MG/3ML SOPN Inject 2 mg into the skin once a week.   spironolactone (ALDACTONE) 25 MG tablet TAKE 1 TABLET(25 MG) BY MOUTH DAILY   torsemide (DEMADEX) 20 MG tablet TAKE 4 TABLETS (80 MG) BY MOUTH DAILY.     Allergies:   Tetracyclines & related   Social History   Tobacco Use   Smoking status: Former    Packs/day: 1.00    Years: 20.00    Pack years: 20.00    Types: Cigarettes    Quit date: 05/23/2018    Years since quitting: 2.6   Smokeless tobacco: Never  Vaping Use   Vaping Use: Never used  Substance Use Topics   Alcohol use: Yes    Alcohol/week: 0.0 standard drinks    Comment: 6 pack a week spread out over weekend   Drug use: No     Family Hx: The patient's family history includes Cancer in his father; Hearing loss in his mother; Heart  failure in his paternal grandfather and another family member.  ROS:   Please see the history of present illness.     All other systems reviewed and are negative.   Prior CV studies:   The following studies were reviewed today:  HST, CPAP titration  Labs/Other Tests and Data Reviewed:    EKG:    Recent Labs: 03/16/2020: Magnesium 2.4; Pro B Natriuretic peptide (BNP) 11.0 04/28/2020: TSH 25.48 09/07/2020: B Natriuretic Peptide 13.2 11/23/2020: ALT 15; Hemoglobin 11.1; Platelet Count 324 12/19/2020: BUN 10; Creatinine, Ser 1.17; Potassium 3.5; Sodium 139   Recent Lipid Panel Lab Results  Component Value Date/Time   CHOL 152 05/25/2018 03:43 AM   TRIG 69 05/25/2018 03:43 AM   HDL 36 (L) 05/25/2018 03:43 AM   CHOLHDL 4.2 05/25/2018  03:43 AM   LDLCALC 102 (H) 05/25/2018 03:43 AM    Wt Readings from Last 3 Encounters:  01/26/21 (!) 364 lb (165.1 kg)  12/13/20 (!) 367 lb 3.2 oz (166.6 kg)  12/12/20 (!) 367 lb 12.8 oz (166.8 kg)     Risk Assessment/Calculations:          Objective:    Vital Signs:  BP 130/67    Pulse (!) 57    Ht 5\' 11"  (1.803 m)    Wt (!) 364 lb (165.1 kg)    BMI 50.77 kg/m    VITAL SIGNS:  reviewed GEN:  no acute distress EYES:  sclerae anicteric, EOMI - Extraocular Movements Intact RESPIRATORY:  normal respiratory effort, symmetric expansion CARDIOVASCULAR:  no peripheral edema SKIN:  no rash, lesions or ulcers. MUSCULOSKELETAL:  no obvious deformities. NEURO:  alert and oriented x 3, no obvious focal deficit PSYCH:  normal affect  ASSESSMENT & PLAN:    OSA -HST showed severe obstructive sleep apnea with an AHI of 61/h and no significant central sleep apnea.  He had nocturnal hypoxemia with O2 sats less than 88% for 23 minutes of total sleep time.   -He underwent CPAP titration to 17 cm H2O as well as O2 at 2 L nightly via CPAP.  -he was changed to auto CPAP and used it last night and things went well -I have encouraged him to use it at night and  also during the day when he naps -I will see him back in 4 weeks -he was supposed to be set up with O2 at night with the CPAP which was not done so I will check with the DME-he is concerned that it may not be covered right now as he is applying for disability -I will get an overnight pulse ox to see if he has hypoxemia on the CPAP  2.  HTN -BP is adequately controlled on exam today. -Continue prescription drug management with carvedilol 12.5 mg twice daily, spironolactone 25 mg daily and Entresto 97-103 mg twice daily with as needed refills  COVID-19 Education: The signs and symptoms of COVID-19 were discussed with the patient and how to seek care for testing (follow up with PCP or arrange E-visit).  The importance of social distancing was discussed today.  Time:   Today, I have spent 20 minutes with the patient with telehealth technology discussing the above problems.     Medication Adjustments/Labs and Tests Ordered: Current medicines are reviewed at length with the patient today.  Concerns regarding medicines are outlined above.   Tests Ordered: No orders of the defined types were placed in this encounter.   Medication Changes: No orders of the defined types were placed in this encounter.   Follow Up:  Virtual Visit  in 4 weeks  Signed, , MD  01/26/2021 8:30 AM    [facility]

## 2021-01-26 NOTE — Patient Instructions (Addendum)
Medication Instructions:  Your physician recommends that you continue on your current medications as directed. Please refer to the Current Medication list given to you today.  *If you need a refill on your cardiac medications before your next appointment, please call your pharmacy*  Testing/Procedures: Your provider has recommended that you wear an overnight pulse oximeter while using CPAP.   Follow-Up: At [facility], you and your health needs are our priority.  As part of our continuing mission to provide you with exceptional heart care, we have created designated Provider Care Teams.  These Care Teams include your primary Cardiologist (physician) and Advanced Practice Providers (APPs -  Physician Assistants and Nurse Practitioners) who all work together to provide you with the care you need, when you need it.  Your next appointment:   2/14 at 11:00am  The format for your next appointment:   Virtual Visit   Provider:   Armanda Magic, MD

## 2021-01-28 DIAGNOSIS — G4733 Obstructive sleep apnea (adult) (pediatric): Secondary | ICD-10-CM | POA: Diagnosis not present

## 2021-02-13 ENCOUNTER — Encounter: Payer: Self-pay | Admitting: Family

## 2021-02-13 ENCOUNTER — Other Ambulatory Visit (HOSPITAL_BASED_OUTPATIENT_CLINIC_OR_DEPARTMENT_OTHER): Payer: Self-pay

## 2021-02-14 DIAGNOSIS — G4733 Obstructive sleep apnea (adult) (pediatric): Secondary | ICD-10-CM

## 2021-02-16 ENCOUNTER — Other Ambulatory Visit: Payer: Self-pay | Admitting: Medical

## 2021-02-22 ENCOUNTER — Inpatient Hospital Stay: Payer: Federal, State, Local not specified - PPO | Admitting: Family

## 2021-02-22 ENCOUNTER — Encounter: Payer: Self-pay | Admitting: Family

## 2021-02-22 ENCOUNTER — Inpatient Hospital Stay: Payer: Federal, State, Local not specified - PPO | Attending: Hematology & Oncology

## 2021-02-22 ENCOUNTER — Other Ambulatory Visit: Payer: Self-pay

## 2021-02-22 VITALS — BP 124/61 | HR 62 | Temp 99.0°F | Resp 18 | Wt 365.0 lb

## 2021-02-22 DIAGNOSIS — I824Y2 Acute embolism and thrombosis of unspecified deep veins of left proximal lower extremity: Secondary | ICD-10-CM

## 2021-02-22 DIAGNOSIS — Z86718 Personal history of other venous thrombosis and embolism: Secondary | ICD-10-CM | POA: Insufficient documentation

## 2021-02-22 DIAGNOSIS — D508 Other iron deficiency anemias: Secondary | ICD-10-CM | POA: Insufficient documentation

## 2021-02-22 DIAGNOSIS — D509 Iron deficiency anemia, unspecified: Secondary | ICD-10-CM | POA: Diagnosis not present

## 2021-02-22 DIAGNOSIS — I2699 Other pulmonary embolism without acute cor pulmonale: Secondary | ICD-10-CM | POA: Diagnosis not present

## 2021-02-22 DIAGNOSIS — Z86711 Personal history of pulmonary embolism: Secondary | ICD-10-CM | POA: Insufficient documentation

## 2021-02-22 LAB — CBC WITH DIFFERENTIAL (CANCER CENTER ONLY)
Abs Immature Granulocytes: 0.02 10*3/uL (ref 0.00–0.07)
Basophils Absolute: 0.1 10*3/uL (ref 0.0–0.1)
Basophils Relative: 1 %
Eosinophils Absolute: 0.5 10*3/uL (ref 0.0–0.5)
Eosinophils Relative: 7 %
HCT: 38.9 % — ABNORMAL LOW (ref 39.0–52.0)
Hemoglobin: 13 g/dL (ref 13.0–17.0)
Immature Granulocytes: 0 %
Lymphocytes Relative: 30 %
Lymphs Abs: 1.9 10*3/uL (ref 0.7–4.0)
MCH: 30.4 pg (ref 26.0–34.0)
MCHC: 33.4 g/dL (ref 30.0–36.0)
MCV: 90.9 fL (ref 80.0–100.0)
Monocytes Absolute: 0.5 10*3/uL (ref 0.1–1.0)
Monocytes Relative: 7 %
Neutro Abs: 3.6 10*3/uL (ref 1.7–7.7)
Neutrophils Relative %: 55 %
Platelet Count: 264 10*3/uL (ref 150–400)
RBC: 4.28 MIL/uL (ref 4.22–5.81)
RDW: 15.9 % — ABNORMAL HIGH (ref 11.5–15.5)
WBC Count: 6.5 10*3/uL (ref 4.0–10.5)
nRBC: 0 % (ref 0.0–0.2)

## 2021-02-22 LAB — CMP (CANCER CENTER ONLY)
ALT: 14 U/L (ref 0–44)
AST: 13 U/L — ABNORMAL LOW (ref 15–41)
Albumin: 4.3 g/dL (ref 3.5–5.0)
Alkaline Phosphatase: 53 U/L (ref 38–126)
Anion gap: 7 (ref 5–15)
BUN: 9 mg/dL (ref 6–20)
CO2: 24 mmol/L (ref 22–32)
Calcium: 9.1 mg/dL (ref 8.9–10.3)
Chloride: 107 mmol/L (ref 98–111)
Creatinine: 1.01 mg/dL (ref 0.61–1.24)
GFR, Estimated: >60 mL/min (ref >60)
Glucose, Bld: 90 mg/dL (ref 70–99)
Potassium: 4.1 mmol/L (ref 3.5–5.1)
Sodium: 138 mmol/L (ref 135–145)
Total Bilirubin: 0.4 mg/dL (ref 0.3–1.2)
Total Protein: 7.2 g/dL (ref 6.5–8.1)

## 2021-02-22 MED ORDER — APIXABAN 5 MG PO TABS: ORAL_TABLET | ORAL | 1 refills | Status: DC

## 2021-02-22 NOTE — Progress Notes (Signed)
Hematology and Oncology Follow Up Visit  TIERNEY HEMMERLING [id number] 06-Oct-1960 61 y.o. 02/22/2021   Principle Diagnosis:  Right lower and middle lobe PEs with no evidence of right heart strain  Left lower extremity DVT involving the left peroneal veins   Current Therapy:        Eliquis 5 mg PO BID - lifelong   Interim History:  Mr. Fowlks is here today for follow-up. He is doing well and has no complaints at this time.  He has not noted any blood loss. No bruising or petechiae.  No fever, chills, n/v, cough, rash, dizziness, SOB, chest pain, palpitations, abdominal pain or changes in bowel or bladder habits.  He has tingling in his legs at times with chronic back issues and pain.  No falls or syncope to report.  The swelling in his lower extremities is unchanged from baseline. He continues to take his Demedex as prescribed.  He has a good appetite and is hydrating properly throughout the day. Weight is 365 lbs.   ECOG Performance Status: 1 - Symptomatic but completely ambulatory  Medications:  Allergies as of 02/22/2021       Reactions   Tetracyclines & Related Hives, Itching        Medication List        Accurate as of February 22, 2021 11:57 AM. If you have any questions, ask your nurse or doctor.          albuterol 108 (90 Base) MCG/ACT inhaler Commonly known as: VENTOLIN HFA Inhale 2 puffs into the lungs every 6 (six) hours as needed for wheezing or shortness of breath.   allopurinol 100 MG tablet Commonly known as: ZYLOPRIM TAKE 1 TABLET(100 MG) BY MOUTH DAILY   apixaban 5 MG Tabs tablet Commonly known as: Eliquis TAKE 1 TABLET(5 MG) BY MOUTH TWICE DAILY   carvedilol 12.5 MG tablet Commonly known as: COREG Take 1 tablet (12.5 mg total) by mouth 2 (two) times daily with a meal.   digoxin 0.125 MG tablet Commonly known as: LANOXIN Take 1 tablet (0.125 mg total) by mouth daily.   Entresto 97-103 MG Generic drug: sacubitril-valsartan Take 1 tablet by  mouth 2 (two) times daily.   Farxiga 10 MG Tabs tablet Generic drug: dapagliflozin propanediol TAKE 1 TABLET(10 MG) BY MOUTH DAILY   levothyroxine 25 MCG tablet Commonly known as: SYNTHROID TAKE 1 TABLET BY MOUTH EVERY DAY BEFORE BREAKFAST   Ozempic (2 MG/DOSE) 8 MG/3ML Sopn Generic drug: Semaglutide (2 MG/DOSE) Inject 2 mg into the skin once a week.   potassium chloride SA 20 MEQ tablet Commonly known as: KLOR-CON M Take 2 tablets (40 mEq total) by mouth daily.   spironolactone 25 MG tablet Commonly known as: ALDACTONE TAKE 1 TABLET(25 MG) BY MOUTH DAILY   torsemide 20 MG tablet Commonly known as: DEMADEX TAKE 4 TABLETS (80 MG) BY MOUTH DAILY.        Allergies:  Allergies  Allergen Reactions   Tetracyclines & Related Hives and Itching    Past Medical History, Surgical history, Social history, and Family History were reviewed and updated.  Review of Systems: All other 10 point review of systems is negative.   Physical Exam:  weight is 365 lb (165.6 kg) (abnormal). His oral temperature is 99 F (37.2 C). His blood pressure is 124/61 and his pulse is 62. His respiration is 18 and oxygen saturation is 96%.   Wt Readings from Last 3 Encounters:  02/22/21 (!) 365 lb (165.6 kg)  01/26/21 Marland Kitchen)  364 lb (165.1 kg)  12/13/20 (!) 367 lb 3.2 oz (166.6 kg)    Ocular: Sclerae unicteric, pupils equal, round and reactive to light Ear-nose-throat: Oropharynx clear, dentition fair Lymphatic: No cervical or supraclavicular adenopathy Lungs no rales or rhonchi, good excursion bilaterally Heart regular rate and rhythm, no murmur appreciated Abd soft, nontender, positive bowel sounds MSK no focal spinal tenderness, no joint edema Neuro: non-focal, well-oriented, appropriate affect Breasts: Deferred   Lab Results  Component Value Date   WBC 6.5 02/22/2021   HGB 13.0 02/22/2021   HCT 38.9 (L) 02/22/2021   MCV 90.9 02/22/2021   PLT 264 02/22/2021   Lab Results  Component  Value Date   FERRITIN 17 (L) 11/23/2020   IRON 50 11/23/2020   TIBC 381 11/23/2020   UIBC 331 11/23/2020   IRONPCTSAT 13 (L) 11/23/2020   Lab Results  Component Value Date   RETICCTPCT 1.9 11/23/2020   RBC 4.28 02/22/2021   No results found for: KPAFRELGTCHN, LAMBDASER, KAPLAMBRATIO No results found for: IGGSERUM, IGA, IGMSERUM No results found for: Odetta Pink, SPEI   Chemistry      Component Value Date/Time   NA 138 02/22/2021 1117   NA 146 (H) 02/05/2020 1114   K 4.1 02/22/2021 1117   CL 107 02/22/2021 1117   CO2 24 02/22/2021 1117   BUN 9 02/22/2021 1117   BUN 16 02/05/2020 1114   CREATININE 1.01 02/22/2021 1117   CREATININE 1.26 11/13/2019 1454      Component Value Date/Time   CALCIUM 9.1 02/22/2021 1117   ALKPHOS 53 02/22/2021 1117   AST 13 (L) 02/22/2021 1117   ALT 14 02/22/2021 1117   BILITOT 0.4 02/22/2021 1117       Impression and Plan: Mr. Swett is a very pleasant 61 yo African American gentleman with history of right lower and middle lobe PEs with no evidence of right heart strain and left lower extremity DVT involving the left peroneal veins with atrial flutter (TEE and cardioversion on 03/07/20). DVT and PE have resolved on follow-up scans in 2022.  So far, he continues to do well on Eliquis long term and there has bo evidence of recurrence. Prescription refilled with [facility].  Iron studies are pending.  Follow-up in 6 months.   Lottie Dawson, NP 2/8/202311:57 AM

## 2021-02-23 ENCOUNTER — Telehealth: Payer: Self-pay | Admitting: *Deleted

## 2021-02-23 LAB — IRON AND IRON BINDING CAPACITY (CC-WL,HP ONLY)
Iron: 49 ug/dL (ref 45–182)
Saturation Ratios: 13 % — ABNORMAL LOW (ref 17.9–39.5)
TIBC: 372 ug/dL (ref 250–450)
UIBC: 323 ug/dL (ref 117–376)

## 2021-02-23 LAB — FERRITIN: Ferritin: 32 ng/mL (ref 24–336)

## 2021-02-23 NOTE — Telephone Encounter (Signed)
-----   Message from Josph Macho, MD sent at 02/23/2021 10:15 AM EST ----- Please call and let him know that the iron is on the low side.  He needs a dose of IV iron.  Please set this up.

## 2021-02-23 NOTE — Telephone Encounter (Signed)
As noted below by Dr. Myna Hidalgo, I informed the patient that his iron level is on the low side. You need to get one dose of IV iron. He verbalized understanding. LOS sent to scheduling.

## 2021-02-24 ENCOUNTER — Telehealth: Payer: Self-pay | Admitting: Family

## 2021-02-24 NOTE — Telephone Encounter (Signed)
Called to schedule one dose of IV iron (Feraheme 510 mg) per 2/9 sch msg, unable to reach patient and unable to leave voicemail , vm not set-up

## 2021-02-28 ENCOUNTER — Telehealth: Payer: Self-pay | Admitting: *Deleted

## 2021-02-28 ENCOUNTER — Telehealth (INDEPENDENT_AMBULATORY_CARE_PROVIDER_SITE_OTHER): Payer: Federal, State, Local not specified - PPO | Admitting: Cardiology

## 2021-02-28 ENCOUNTER — Encounter: Payer: Self-pay | Admitting: Cardiology

## 2021-02-28 ENCOUNTER — Other Ambulatory Visit: Payer: Self-pay

## 2021-02-28 VITALS — Ht 71.0 in

## 2021-02-28 DIAGNOSIS — I10 Essential (primary) hypertension: Secondary | ICD-10-CM

## 2021-02-28 DIAGNOSIS — G4733 Obstructive sleep apnea (adult) (pediatric): Secondary | ICD-10-CM | POA: Diagnosis not present

## 2021-02-28 MED ORDER — MELATONIN 3 MG PO TABS: 3.0000 mg | ORAL_TABLET | Freq: Every day | ORAL | 3 refills | Status: AC

## 2021-02-28 NOTE — Patient Instructions (Signed)
Medication Instructions:  Your physician has recommended you make the following change in your medication:  1) START taking melatonin 3 mg nightly ~45 minutes prior to going to sleep   *If you need a refill on your cardiac medications before your next appointment, please call your pharmacy*  Follow-Up: At [facility], you and your health needs are our priority.  As part of our continuing mission to provide you with exceptional heart care, we have created designated Provider Care Teams.  These Care Teams include your primary Cardiologist (physician) and Advanced Practice Providers (APPs -  Physician Assistants and Nurse Practitioners) who all work together to provide you with the care you need, when you need it.  Your next appointment:   3 month(s)  The format for your next appointment:   Virtual Visit   Provider:   Armanda Magic, MD

## 2021-02-28 NOTE — Progress Notes (Signed)
Virtual Visit via Video Note   This visit type was conducted due to national recommendations for restrictions regarding the COVID-19 Pandemic (e.g. social distancing) in an effort to limit this patient's exposure and mitigate transmission in our community.  Due to his co-morbid illnesses, this patient is at least at moderate risk for complications without adequate follow up.  This format is felt to be most appropriate for this patient at this time.  All issues noted in this document were discussed and addressed.  A limited physical exam was performed with this format.  Please refer to the patient's chart for his consent to telehealth for [facility].    Date:  02/28/2021   ID:  Drew [name], DOB Sep 07, 1960, MRN [id number] The patient was identified using 2 identifiers.  Patient Location: Home Provider Location: Home Office   PCP:  Saguier, Ramon Dredge, PA-C   [facility] Providers Cardiologist:  Thomasene Ripple, DO     Evaluation Performed:  Follow-Up Visit  Chief Complaint:  OSA  History of Present Illness:    Drew [name] is a 61 y.o. male with chronic combined systolic CHF, dilated cardiomyopathy, hypertension, hyperlipidemia, obesity, nonischemic cardiomyopathy and pulmonary embolism who was referred by Dr. Shirlee Latch for evaluation of possible obstructive sleep apnea.  He underwent home sleep study showing severe obstructive sleep apnea with an AHI of 61/h and no significant central sleep apnea.  He had nocturnal hypoxemia with O2 sats less than 88% for 23 minutes of total sleep time.  He underwent CPAP titration to 17 cm H2O as well as O2 at 2 L nightly via CPAP.  Unfortunately he did not tolerate the pressure and wake up at night and have to take the mask off and will start choking.  He was switched to auto CPAP from 4 to 20 cm H2O.  He is now back for follow-up.  He is only sleeps for 3-4 hours at a time and is not compliant with his device. He says that he never has slept more  than 3-4 hours at night for years but does nap for 2 hours in the afternoon.  He tolerates the mask and feels the pressure is adequate.  He denies any significant mouth or nasal dryness or nasal congestion.  He does not think that he snores.   He has not tried any sleep aides and used to work nights for a long time so he has never been able to sleep well at night since then.   The patient does not have symptoms concerning for COVID-19 infection (fever, chills, cough, or new shortness of breath).    Past Medical History:  Diagnosis Date   Acute CHF (congestive heart failure) (HCC) 05/24/2018   Acute on chronic combined systolic and diastolic CHF (congestive heart failure) (HCC)    Acute systolic HF (heart failure) (HCC) 06/02/2018   AKI (acute kidney injury) (HCC) 03/03/2020   Alcohol use 05/24/2018   Allergy    Chronic systolic CHF (congestive heart failure) (HCC) 03/03/2020   Dilated cardiomyopathy (HCC)    Essential hypertension 05/24/2018   HFrEF (heart failure with reduced ejection fraction) (HCC) 11/20/2019   HTN (hypertension)    Hyperlipidemia 05/24/2018   Hypokalemia 03/03/2020   IDA (iron deficiency anemia) 08/23/2020   Knee pain, left 02/11/2014   New onset of congestive heart failure (HCC) 05/23/2018   Nonischemic cardiomyopathy (HCC) 11/20/2019   Obesity, Class III, BMI 40-49.9 (morbid obesity) (HCC) 05/24/2018   Pulmonary embolism (HCC) 03/03/2020   Seasonal allergies  02/11/2014   Tobacco abuse 05/24/2018   Typical atrial flutter (HCC)    Wellness examination 02/26/2014   Past Surgical History:  Procedure Laterality Date   CARDIOVERSION N/A 03/07/2020   Procedure: CARDIOVERSION;  Surgeon: Laurey Morale, MD;  Location: [facility] ENDOSCOPY;  Service: Cardiovascular;  Laterality: N/A;   KNEE ARTHROSCOPY  2009   RIGHT/LEFT HEART CATH AND CORONARY ANGIOGRAPHY N/A 05/26/2018   Procedure: RIGHT/LEFT HEART CATH AND CORONARY ANGIOGRAPHY;  Surgeon: Lyn Records, MD;  Location: [facility] INVASIVE CV LAB;  Service:  Cardiovascular;  Laterality: N/A;   TEE WITHOUT CARDIOVERSION N/A 03/07/2020   Procedure: TRANSESOPHAGEAL ECHOCARDIOGRAM (TEE);  Surgeon: Laurey Morale, MD;  Location: [facility] ENDOSCOPY;  Service: Cardiovascular;  Laterality: N/A;   THROAT SURGERY  2005     Current Meds  Medication Sig   albuterol (PROVENTIL HFA;VENTOLIN HFA) 108 (90 Base) MCG/ACT inhaler Inhale 2 puffs into the lungs every 6 (six) hours as needed for wheezing or shortness of breath.   allopurinol (ZYLOPRIM) 100 MG tablet TAKE 1 TABLET(100 MG) BY MOUTH DAILY   apixaban (ELIQUIS) 5 MG TABS tablet TAKE 1 TABLET(5 MG) BY MOUTH TWICE DAILY   carvedilol (COREG) 12.5 MG tablet Take 1 tablet (12.5 mg total) by mouth 2 (two) times daily with a meal.   digoxin (LANOXIN) 0.125 MG tablet Take 1 tablet (0.125 mg total) by mouth daily.   FARXIGA 10 MG TABS tablet TAKE 1 TABLET(10 MG) BY MOUTH DAILY   levothyroxine (SYNTHROID) 25 MCG tablet TAKE 1 TABLET BY MOUTH EVERY DAY BEFORE BREAKFAST   potassium chloride SA (KLOR-CON) 20 MEQ tablet Take 2 tablets (40 mEq total) by mouth daily.   sacubitril-valsartan (ENTRESTO) 97-103 MG Take 1 tablet by mouth 2 (two) times daily.   Semaglutide, 2 MG/DOSE, (OZEMPIC, 2 MG/DOSE,) 8 MG/3ML SOPN Inject 2 mg into the skin once a week.   spironolactone (ALDACTONE) 25 MG tablet TAKE 1 TABLET(25 MG) BY MOUTH DAILY   torsemide (DEMADEX) 20 MG tablet TAKE 4 TABLETS (80 MG) BY MOUTH DAILY.     Allergies:   Tetracyclines & related   Social History   Tobacco Use   Smoking status: Former    Packs/day: 1.00    Years: 20.00    Pack years: 20.00    Types: Cigarettes    Quit date: 05/23/2018    Years since quitting: 2.7   Smokeless tobacco: Never  Vaping Use   Vaping Use: Never used  Substance Use Topics   Alcohol use: Yes    Alcohol/week: 0.0 standard drinks    Comment: 6 pack a week spread out over weekend   Drug use: No     Family Hx: The patient's family history includes Cancer in his father;  Hearing loss in his mother; Heart failure in his paternal grandfather and another family member.  ROS:   Please see the history of present illness.     All other systems reviewed and are negative.   Prior CV studies:   The following studies were reviewed today:  HST, CPAP titration  Labs/Other Tests and Data Reviewed:    EKG: Not performed  Recent Labs: 03/16/2020: Magnesium 2.4; Pro B Natriuretic peptide (BNP) 11.0 04/28/2020: TSH 25.48 09/07/2020: B Natriuretic Peptide 13.2 02/22/2021: ALT 14; BUN 9; Creatinine 1.01; Hemoglobin 13.0; Platelet Count 264; Potassium 4.1; Sodium 138   Recent Lipid Panel Lab Results  Component Value Date/Time   CHOL 152 05/25/2018 03:43 AM   TRIG 69 05/25/2018 03:43 AM   HDL 36 (  L) 05/25/2018 03:43 AM   CHOLHDL 4.2 05/25/2018 03:43 AM   LDLCALC 102 (H) 05/25/2018 03:43 AM    Wt Readings from Last 3 Encounters:  02/22/21 (!) 365 lb (165.6 kg)  01/26/21 (!) 364 lb (165.1 kg)  12/13/20 (!) 367 lb 3.2 oz (166.6 kg)     Risk Assessment/Calculations:          Objective:    Vital Signs:  Ht 5\' 11"  (1.803 m)    BMI 50.91 kg/m   Well nourished, well developed male in no acute distress. Well appearing, alert and conversant, regular work of breathing,  good skin color  Eyes- anicteric mouth- oral mucosa is pink  neuro- grossly intact skin- no apparent rash or lesions or cyanosis  ASSESSMENT & PLAN:     OSA - The patient is tolerating PAP therapy well without any problems. The PAP download performed by his DME was personally reviewed and interpreted by me today and showed an AHI of 1.2 /hr on auto CPAP  with 3% compliance in using more than 4 hours nightly.  The patient has been using and benefiting from PAP use and will continue to benefit from therapy.  -encouraged him to be more compliant with his device -will try melatonin to help with sleep -Encouraged him to use his device when he naps during the day   2.  HTN -BP is adequately  controlled at home -Continue prescription drug management carvedilol 12.5 mg twice daily, spironolactone 25 mg daily and Entresto 97-23 mg twice daily with as needed refills  3.  Insomnia - he used to work nights for years and so he has never been able to get back into routine nightly sleep. - I have recommended that he take Melatonin 3mg  nightly 45 min before going to sleep  COVID-19 Education: The signs and symptoms of COVID-19 were discussed with the patient and how to seek care for testing (follow up with PCP or arrange E-visit).  The importance of social distancing was discussed today.  Time:   Today, I have spent 15 minutes with the patient with telehealth technology discussing the above problems.     Medication Adjustments/Labs and Tests Ordered: Current medicines are reviewed at length with the patient today.  Concerns regarding medicines are outlined above.   Tests Ordered: No orders of the defined types were placed in this encounter.   Medication Changes: No orders of the defined types were placed in this encounter.   Follow Up:  Virtual Visit  in 3 months  Signed, Armanda Magic, MD  02/28/2021 11:04 AM    [facility]

## 2021-02-28 NOTE — Addendum Note (Signed)
Addended by: Antonieta Iba on: 02/28/2021 11:14 AM   Modules accepted: Orders

## 2021-02-28 NOTE — Telephone Encounter (Signed)
Per 02/22/21 los - called and gave upcoming appointments - confirmed

## 2021-03-01 ENCOUNTER — Other Ambulatory Visit: Payer: Self-pay

## 2021-03-01 ENCOUNTER — Inpatient Hospital Stay: Payer: Federal, State, Local not specified - PPO

## 2021-03-01 VITALS — BP 91/54 | HR 57 | Temp 98.4°F | Resp 18

## 2021-03-01 DIAGNOSIS — D509 Iron deficiency anemia, unspecified: Secondary | ICD-10-CM | POA: Diagnosis not present

## 2021-03-01 DIAGNOSIS — Z86711 Personal history of pulmonary embolism: Secondary | ICD-10-CM | POA: Diagnosis not present

## 2021-03-01 DIAGNOSIS — D508 Other iron deficiency anemias: Secondary | ICD-10-CM | POA: Diagnosis not present

## 2021-03-01 DIAGNOSIS — Z86718 Personal history of other venous thrombosis and embolism: Secondary | ICD-10-CM | POA: Diagnosis not present

## 2021-03-01 MED ORDER — SODIUM CHLORIDE 0.9 % IV SOLN: Freq: Once | INTRAVENOUS | Status: AC

## 2021-03-01 MED ORDER — SODIUM CHLORIDE 0.9 % IV SOLN
510.0000 mg | Freq: Once | INTRAVENOUS | Status: AC
  Administered 2021-03-01: 510 mg via INTRAVENOUS
  Filled 2021-03-01: qty 17

## 2021-03-01 NOTE — Patient Instructions (Signed)

## 2021-03-07 ENCOUNTER — Ambulatory Visit: Payer: Federal, State, Local not specified - PPO | Admitting: Pharmacist

## 2021-03-07 ENCOUNTER — Other Ambulatory Visit (HOSPITAL_BASED_OUTPATIENT_CLINIC_OR_DEPARTMENT_OTHER): Payer: Self-pay

## 2021-03-07 ENCOUNTER — Other Ambulatory Visit: Payer: Self-pay

## 2021-03-07 VITALS — BP 120/78 | HR 62 | Wt 364.8 lb

## 2021-03-07 LAB — LIPID PANEL
Chol/HDL Ratio: 3.9 ratio (ref 0.0–5.0)
Cholesterol, Total: 177 mg/dL (ref 100–199)
HDL: 45 mg/dL (ref >39)
LDL Chol Calc (NIH): 115 mg/dL — ABNORMAL HIGH (ref 0–99)
Triglycerides: 95 mg/dL (ref 0–149)
VLDL Cholesterol Cal: 17 mg/dL (ref 5–40)

## 2021-03-07 LAB — HEMOGLOBIN A1C
Est. average glucose Bld gHb Est-mCnc: 111 mg/dL
Hgb A1c MFr Bld: 5.5 % (ref 4.8–5.6)

## 2021-03-07 MED ORDER — WEGOVY 2.4 MG/0.75ML ~~LOC~~ SOAJ
2.4000 mg | SUBCUTANEOUS | 3 refills | Status: DC
  Filled 2021-03-07: qty 9, 84d supply, fill #0
  Filled 2021-06-15: qty 9, 84d supply, fill #1
  Filled 2021-06-23: qty 3, 28d supply, fill #1

## 2021-03-07 NOTE — Patient Instructions (Signed)
It was nice to see you today  After you give your last Ozempic injection, change over to [facility] 2.4mg  subcutaneous injection once weekly  Continue taking your other medications  We'll check an updated A1c and cholesterol panel today  You can sign up online for a copay card for [facility] to bring the price down to $0

## 2021-03-07 NOTE — Progress Notes (Signed)
Patient ID: Drew [name]                 DOB: June 24, 1960                    MRN: [id number]     HPI: Drew [name] is a 61 y.o. male patient referred to pharmacy clinic by Dr Shirlee Latch for GLP1-RA follow up for weight loss. PMH is significant for obesity complicated by chronic medical conditions including  HTN, HLD, obesity, severe knee OA, hx PE, iron deficiency, tobacco use and NICM. He started on Ozempic 6 months ago and presents today for follow up.  In 2022 his insurance plan did not cover Wegovy so pt was started on Ozempic. I submitted a prior authorization this morning and for the 2023 year his plan does cover Wegovy. Received prior authorization approval through 09/03/21. Pt reports tolerating Ozempic well. Denies side effects. Med has been curbing his appetite. Usually eats 1 meal a day - his wife bakes meals. Doesn't fry any food, eats chicken primarily, occasionally beef. Eats salads frequently as well. Snacks on fruit, not eating chips anymore. Drinks water, [place] lemonade tea - stretches 4 out to last him a week and a half. His wife watches her sugar intake, she's prediabetic and on Ozempic as well. Walking some but knee pain limits mobility. Hoping for knee surgery.  Current weight management medications: Ozempic 2mg  weekly  Previously tried meds: none  Current meds that may affect weight: levothyroxine  Baseline weight/BMI: 381 lbs/53.14  Insurance payor: [employer] FEP  Diet:  -Breakfast: nothing -Lunch: sometimes he eats sometimes he doesn't (eats 1-2 meals per day) Cobb salad from chicken fil A -Dinner: steak and eggs biscuits, baked chicken, spaghetti, , peas, potatoes, green beans, broccoli rhab -Snacks: devil eggs, mike and ikes -Drinks: lemonade, water, 2-3 beers on Sunday  Exercise: Not exercising due to back and knee pain.  Family History: Paternal grandfather with heart failure, father with cancer, mother with hearing loss.  Social History: Former smoker,  quit in 2020, 2-3 beers on Sundays  Labs: Lab Results  Component Value Date   HGBA1C 6.5 04/28/2020    Wt Readings from Last 1 Encounters:  02/22/21 (!) 365 lb (165.6 kg)    BP Readings from Last 1 Encounters:  03/01/21 (!) 91/54   Pulse Readings from Last 1 Encounters:  03/01/21 (!) 57       Component Value Date/Time   CHOL 152 05/25/2018 0343   TRIG 69 05/25/2018 0343   HDL 36 (L) 05/25/2018 0343   CHOLHDL 4.2 05/25/2018 0343   VLDL 14 05/25/2018 0343   LDLCALC 102 (H) 05/25/2018 0343    Past Medical History:  Diagnosis Date   Acute CHF (congestive heart failure) (HCC) 05/24/2018   Acute on chronic combined systolic and diastolic CHF (congestive heart failure) (HCC)    Acute systolic HF (heart failure) (HCC) 06/02/2018   AKI (acute kidney injury) (HCC) 03/03/2020   Alcohol use 05/24/2018   Allergy    Chronic systolic CHF (congestive heart failure) (HCC) 03/03/2020   Dilated cardiomyopathy (HCC)    Essential hypertension 05/24/2018   HFrEF (heart failure with reduced ejection fraction) (HCC) 11/20/2019   HTN (hypertension)    Hyperlipidemia 05/24/2018   Hypokalemia 03/03/2020   IDA (iron deficiency anemia) 08/23/2020   Knee pain, left 02/11/2014   New onset of congestive heart failure (HCC) 05/23/2018   Nonischemic cardiomyopathy (HCC) 11/20/2019   Obesity, Class III, BMI 40-49.9 (morbid obesity) (  HCC) 05/24/2018   Pulmonary embolism (HCC) 03/03/2020   Seasonal allergies 02/11/2014   Tobacco abuse 05/24/2018   Typical atrial flutter (HCC)    Wellness examination 02/26/2014    Current Outpatient Medications on File Prior to Visit  Medication Sig Dispense Refill   albuterol (PROVENTIL HFA;VENTOLIN HFA) 108 (90 Base) MCG/ACT inhaler Inhale 2 puffs into the lungs every 6 (six) hours as needed for wheezing or shortness of breath. 1 Inhaler 2   allopurinol (ZYLOPRIM) 100 MG tablet TAKE 1 TABLET(100 MG) BY MOUTH DAILY 90 tablet 0   apixaban (ELIQUIS) 5 MG TABS tablet TAKE 1 TABLET(5 MG)  BY MOUTH TWICE DAILY 180 tablet 1   carvedilol (COREG) 12.5 MG tablet Take 1 tablet (12.5 mg total) by mouth 2 (two) times daily with a meal. 60 tablet 4   digoxin (LANOXIN) 0.125 MG tablet Take 1 tablet (0.125 mg total) by mouth daily. 90 tablet 3   FARXIGA 10 MG TABS tablet TAKE 1 TABLET(10 MG) BY MOUTH DAILY 30 tablet 2   levothyroxine (SYNTHROID) 25 MCG tablet TAKE 1 TABLET BY MOUTH EVERY DAY BEFORE BREAKFAST 90 tablet 2   melatonin 3 MG TABS tablet Take 1 tablet (3 mg total) by mouth at bedtime. 90 tablet 3   potassium chloride SA (KLOR-CON) 20 MEQ tablet Take 2 tablets (40 mEq total) by mouth daily. 60 tablet 6   sacubitril-valsartan (ENTRESTO) 97-103 MG Take 1 tablet by mouth 2 (two) times daily. 60 tablet 6   Semaglutide, 2 MG/DOSE, (OZEMPIC, 2 MG/DOSE,) 8 MG/3ML SOPN Inject 2 mg into the skin once a week. 3 mL 11   spironolactone (ALDACTONE) 25 MG tablet TAKE 1 TABLET(25 MG) BY MOUTH DAILY 30 tablet 5   torsemide (DEMADEX) 20 MG tablet TAKE 4 TABLETS (80 MG) BY MOUTH DAILY. 120 tablet 6   No current facility-administered medications on file prior to visit.    Allergies  Allergen Reactions   Tetracyclines & Related Hives and Itching     Assessment/Plan:  1. Weight loss - Pt has been on GLP1RA therapy now for 6 months. Has lost 16 lbs (4.2% of his body weight), lower than expected response to medication. Mainly eating 1 meal a day as his appetite has been suppressed on medication. Discussed eating smaller more frequent snacks throughout the day. Exercise is limited by knee pain but he is walking as able. Will change pt from Ozempic 2mg  to [facility] 2.4mg  once weekly as his insurance plan now covers [facility] for 2023 year. Counseled pt on injection technique of Wegovy pens. Checking updated lipid panel and A1c today. A1c previously 6.5% before GLP1RA therapy was started, anticipate notable improvement in this. Follow up with PharmD as needed.  Joyel Chenette E. Lareina Espino, PharmD, BCACP, CPP [facility] [address], [place], [place] Phone: [phone]; Fax: [phone] 03/07/2021 9:48 AM

## 2021-03-14 ENCOUNTER — Encounter (HOSPITAL_COMMUNITY): Payer: Federal, State, Local not specified - PPO

## 2021-03-27 NOTE — Progress Notes (Incomplete)
Advanced Heart Failure Clinic Note    PCP: Esperanza Richters, PA-C Primary Cardiologist: Dr Servando Salina  HF Cardiologist: Dr. Shirlee Latch  HPI: 61 y.o. w/ h/o HTN, HLD, obesity, severe knee OA, tobacco use and NICM, first diagnosed in 05/2018 when admitted for acute CHF. Echo showed LVEF 20-25%. RV was moderately reduced. LHC showed normal cors.    Had 2D echo on 02/29/20 showing EF 25-30%. RV mild-moderately reduced.    Seen at [facility] (03/03/20) w/ complaints of 4 day h/o increased dyspnea w/ hypoxia and pleuritic chest pain. Noted to be in sinus tach w/ HR in the 130s. Given concern for PE, he was referred to [facility] ED where CT angio confirmed Rt middle and lower lobe PE. No evidence of RV strain. Admitted and started on IV heparin. LE Venous doppler + for Lt DVT.  Echo done (03/04/20) RV moderately enlarged w/ mild-moderately reduced systolic function. Not significantly changed from echo 02/29/20. BNP also normal at 27. He remained hemodynamically stable. Hospitalization c/b atrial flutter, s/p successful TEE with DCCV (03/07/20). He was discharged on GDMT, discharge weight 360 lbs.   Echo in 5/22 showed EF 35-40%, global hypokinesis, mild RVE with normal function, PASP 31 mmHg.   Today he returns for HF follow up.  Weight down 15 lbs, he has started semaglutide.  He saw orthopedics and had knee injection with some improvement.  Still has activity limited by knee and back pain. No dyspnea walking into the office, but gets short of breath walking longer distances.  He used his CPAP machine once but says that the "setting is too strong" and he cannot sleep with it.   ECG (personally reviewed): NSR, nonspecific T wave changes.   Labs (3/22): K 4, creatinine 1.3 Labs (8/22): K 3.7, creatinine 1.13, hgb 9.9, transferrin saturation 8% Labs (11/22): K 3.1, creatinine 1.04, hgb 11.1  PMH: 1. HTN 2. Hyperlipidemia 3. Hypothyroidism 4. OA: Knees 5. Prior smoker 6. Venous thromboembolism: Left DVT with PE  in 2/22.  7. Atrial flutter: 2/22, s/p TEE-guided DCCV.  8. Chronic systolic CHF: Nonischemic cardiomyopathy.  - Echo (5/20): EF 20-25%.  - LHC/RHC (5/20): No significant CAD. Mean RA 6, PA 41/13, mean PCWP 20, CI 3.21.  - TEE (2/22): EF 20-25%, moderate LV dilation, mild RV enlargement with moderately decreased systolic function.  - Cardiac MRI (2/22): Moderate LV dilation with EF 42%, moderate RV dilation with EF 42%, nonspecific RV insertion site LGE.  - Echo (5/22): EF 35-40%, global hypokinesis, mild RVE with normal function, PASP 31 mmHg.  9. Obesity 10. Gout 11. Fe deficiency anemia 12. OSA  ROS: All systems negative except as listed in HPI, PMH and Problem List.  SH:  Social History   Socioeconomic History   Marital status: Married    Spouse name: Not on file   Number of children: Not on file   Years of education: Not on file   Highest education level: Not on file  Occupational History   Not on file  Tobacco Use   Smoking status: Former    Packs/day: 1.00    Years: 20.00    Pack years: 20.00    Types: Cigarettes    Quit date: 05/23/2018    Years since quitting: 2.8   Smokeless tobacco: Never  Vaping Use   Vaping Use: Never used  Substance and Sexual Activity   Alcohol use: Yes    Alcohol/week: 0.0 standard drinks    Comment: 6 pack a week spread out over weekend  Drug use: No   Sexual activity: Yes  Other Topics Concern   Not on file  Social History Narrative   [employer].   Married lives with wife, mother in law and daughter.  7 children.     Social Determinants of Health   Financial Resource Strain: High Risk   Difficulty of Paying Living Expenses: Hard  Food Insecurity: No Food Insecurity   Worried About Programme researcher, broadcasting/film/video in the Last Year: Never true   Ran Out of Food in the Last Year: Never true  Transportation Needs: No Transportation Needs   Lack of Transportation (Medical): No   Lack of Transportation (Non-Medical): No  Physical Activity: Not on  file  Stress: Not on file  Social Connections: Not on file  Intimate Partner Violence: Not on file   FH:  Family History  Problem Relation Age of Onset   Hearing loss Mother    Cancer Father    Heart failure Other    Heart failure Paternal Grandfather    Current Outpatient Medications  Medication Sig Dispense Refill   albuterol (PROVENTIL HFA;VENTOLIN HFA) 108 (90 Base) MCG/ACT inhaler Inhale 2 puffs into the lungs every 6 (six) hours as needed for wheezing or shortness of breath. 1 Inhaler 2   allopurinol (ZYLOPRIM) 100 MG tablet TAKE 1 TABLET(100 MG) BY MOUTH DAILY 90 tablet 0   apixaban (ELIQUIS) 5 MG TABS tablet TAKE 1 TABLET(5 MG) BY MOUTH TWICE DAILY 180 tablet 1   carvedilol (COREG) 12.5 MG tablet Take 1 tablet (12.5 mg total) by mouth 2 (two) times daily with a meal. 60 tablet 4   digoxin (LANOXIN) 0.125 MG tablet Take 1 tablet (0.125 mg total) by mouth daily. 90 tablet 3   FARXIGA 10 MG TABS tablet TAKE 1 TABLET(10 MG) BY MOUTH DAILY 30 tablet 2   levothyroxine (SYNTHROID) 25 MCG tablet TAKE 1 TABLET BY MOUTH EVERY DAY BEFORE BREAKFAST 90 tablet 2   melatonin 3 MG TABS tablet Take 1 tablet (3 mg total) by mouth at bedtime. 90 tablet 3   potassium chloride SA (KLOR-CON) 20 MEQ tablet Take 2 tablets (40 mEq total) by mouth daily. 60 tablet 6   sacubitril-valsartan (ENTRESTO) 97-103 MG Take 1 tablet by mouth 2 (two) times daily. 60 tablet 6   spironolactone (ALDACTONE) 25 MG tablet TAKE 1 TABLET(25 MG) BY MOUTH DAILY 30 tablet 5   torsemide (DEMADEX) 20 MG tablet TAKE 4 TABLETS (80 MG) BY MOUTH DAILY. 120 tablet 6   WEGOVY 2.4 MG/0.75ML SOAJ Inject 2.4 mg into the skin once a week. 9 mL 3   No current facility-administered medications for this visit.    There were no vitals filed for this visit.  Wt Readings from Last 3 Encounters:  03/07/21 (!) 165.5 kg (364 lb 12.8 oz)  02/22/21 (!) 165.6 kg (365 lb)  01/26/21 (!) 165.1 kg (364 lb)   PHYSICAL EXAM: General: NAD,  obese Neck: No JVD, no thyromegaly or thyroid nodule.  Lungs: Clear to auscultation bilaterally with normal respiratory effort. CV: Nondisplaced PMI.  Heart regular S1/S2, no S3/S4, no murmur.  Trace edema.  No carotid bruit.  Normal pedal pulses.  Abdomen: Soft, nontender, no hepatosplenomegaly, no distention.  Skin: Intact without lesions or rashes.  Neurologic: Alert and oriented x 3.  Psych: Normal affect. Extremities: No clubbing or cyanosis.  HEENT: Normal.   ASSESSMENT & PLAN: 1. PE possibly with infarction and LLE DVT: 2/22, unprovoked.  Patient has no FH of VTE.  He  had not been sedentary, had been working full time at the [employer]. Echo in 2/22 with moderate RV dilation/moderate dysfunction. Echo in 5/22 showed mild RV enlargement, normal RV function.  - Continue Eliquis long-term.  2. Atrial flutter: Typical flutter.  This was noted at when he was admitted for PE in 2/22.  Suspect this was triggered by PE.  DCCV in 2/22. He is in NSR today, no longer taking amiodarone.  - Continue anticoagulation therapy w/ Eliquis.    - If recurrence, should have atrial flutter ablation.   3. Chronic systolic CHF: Diagnosed in 2020.  Cath from 5/20 with no significant CAD.  Cause of CMP is uncertain, nonischemic cardiomyopathy. ?Viral myocarditis.  02/2020 cMRI LV/RV EF 42% with only nonspecific RV insertion site LGE.  Echo in 5/22 showed EF 35-40% with mild RVE and normal RV function.  Not volume overloaded on exam, but has NYHA class III symptoms, likely worsened by knee and back pain which limit exertion as well.  - Increase Coreg to 12.5 mg bid.  - Continue spironolactone 25 mg daily. BMET today.  - Continue digoxin 0.125, check level today.  - Continue Entresto 97/103 bid.  - Continue torsemide 80 mg daily. - Continue dapagliflozin 10 mg daily.  - EF appears just out of range for ICD.  Narrow QRS so not candidate for CRT.  4. Obesity: There is no height or weight on file to calculate BMI.   Obesity is a major contributor to his knee pain and may contribute to his cardiomyopathy.  [facility] was going to be too expensive.   - He has started semaglutide and has been losing weight.  5. OSA: Unable to tolerate current CPAP settings, will discuss with Dr. Mayford Knife.  6. Fe deficiency anemia: Transferrin saturation 8%, has had Fe infusion. Needs GI evaluation for colonoscopy (has not had).  7. Knee OA: Knee pain is a major limitor.  He is not a surgical candidate at this time due to morbid obesity.  - Seeing orthopedics (Dr. August Saucer).    F/u APP 3 months.    [name] [facility] 03/27/2021

## 2021-03-28 ENCOUNTER — Other Ambulatory Visit: Payer: Self-pay

## 2021-03-28 ENCOUNTER — Ambulatory Visit (HOSPITAL_COMMUNITY)
Admission: RE | Admit: 2021-03-28 | Discharge: 2021-03-28 | Disposition: A | Discharge status: Home / Self Care | Payer: Federal, State, Local not specified - PPO | Source: Ambulatory Visit | Attending: Family Medicine | Admitting: Family Medicine

## 2021-03-28 ENCOUNTER — Encounter (HOSPITAL_COMMUNITY): Payer: Self-pay

## 2021-03-28 VITALS — BP 114/76 | HR 66 | Wt 350.0 lb

## 2021-03-28 DIAGNOSIS — M1710 Unilateral primary osteoarthritis, unspecified knee: Secondary | ICD-10-CM | POA: Diagnosis not present

## 2021-03-28 DIAGNOSIS — I5022 Chronic systolic (congestive) heart failure: Secondary | ICD-10-CM

## 2021-03-28 DIAGNOSIS — I2699 Other pulmonary embolism without acute cor pulmonale: Secondary | ICD-10-CM

## 2021-03-28 DIAGNOSIS — D508 Other iron deficiency anemias: Secondary | ICD-10-CM

## 2021-03-28 DIAGNOSIS — I428 Other cardiomyopathies: Secondary | ICD-10-CM | POA: Diagnosis not present

## 2021-03-28 DIAGNOSIS — D509 Iron deficiency anemia, unspecified: Secondary | ICD-10-CM | POA: Insufficient documentation

## 2021-03-28 DIAGNOSIS — I483 Typical atrial flutter: Secondary | ICD-10-CM

## 2021-03-28 DIAGNOSIS — Z79899 Other long term (current) drug therapy: Secondary | ICD-10-CM | POA: Diagnosis not present

## 2021-03-28 DIAGNOSIS — Z6841 Body Mass Index (BMI) 40.0 and over, adult: Secondary | ICD-10-CM | POA: Insufficient documentation

## 2021-03-28 DIAGNOSIS — I110 Hypertensive heart disease with heart failure: Secondary | ICD-10-CM | POA: Insufficient documentation

## 2021-03-28 DIAGNOSIS — G4733 Obstructive sleep apnea (adult) (pediatric): Secondary | ICD-10-CM

## 2021-03-28 DIAGNOSIS — M25562 Pain in left knee: Secondary | ICD-10-CM

## 2021-03-28 DIAGNOSIS — Z7901 Long term (current) use of anticoagulants: Secondary | ICD-10-CM | POA: Diagnosis not present

## 2021-03-28 LAB — BASIC METABOLIC PANEL
Anion gap: 8 (ref 5–15)
BUN: 18 mg/dL (ref 6–20)
CO2: 25 mmol/L (ref 22–32)
Calcium: 9 mg/dL (ref 8.9–10.3)
Chloride: 105 mmol/L (ref 98–111)
Creatinine, Ser: 1.28 mg/dL — ABNORMAL HIGH (ref 0.61–1.24)
GFR, Estimated: >60 mL/min (ref >60)
Glucose, Bld: 102 mg/dL — ABNORMAL HIGH (ref 70–99)
Potassium: 3.9 mmol/L (ref 3.5–5.1)
Sodium: 138 mmol/L (ref 135–145)

## 2021-03-28 LAB — DIGOXIN LEVEL: Digoxin Level: 0.3 ng/mL — ABNORMAL LOW (ref 0.8–2.0)

## 2021-03-28 NOTE — Patient Instructions (Addendum)
Thank you for coming in today ? ?Labs were done today, if any labs are abnormal the clinic will call you ? ?Your physician recommends that you schedule a follow-up appointment in:  ?3 months with Dr. Shirlee Latch with echocardiogram ? ?Your physician has requested that you have an echocardiogram. Echocardiogram is a painless test that uses sound waves to create images of your heart. It provides your doctor with information about the size and shape of your heart and how well your heart?s chambers and valves are working. This procedure takes approximately one hour. There are no restrictions for this procedure. ?  ?At the Advanced Heart Failure Clinic, you and your health needs are our priority. As part of our continuing mission to provide you with exceptional heart care, we have created designated Provider Care Teams. These Care Teams include your primary Cardiologist (physician) and Advanced Practice Providers (APPs- Physician Assistants and Nurse Practitioners) who all work together to provide you with the care you need, when you need it.  ? ?You may see any of the following providers on your designated Care Team at your next follow up: ?Dr Arvilla Meres ?Dr [name] ?Tonye Becket, NP ?Robbie Lis, PA ?Jessica Milford,NP ?Anna Genre, PA ?Karle Plumber, PharmD ? ? ?Please be sure to bring in all your medications bottles to every appointment.  ? ?If you have any questions or concerns before your next appointment please send [place] a message through [place] or call our office at [phone].   ? ?TO LEAVE A MESSAGE FOR THE NURSE SELECT OPTION 2, PLEASE LEAVE A MESSAGE INCLUDING: ?YOUR NAME ?DATE OF BIRTH ?CALL BACK NUMBER ?REASON FOR CALL**this is important as we prioritize the call backs ? ?YOU WILL RECEIVE A CALL BACK THE SAME DAY AS LONG AS YOU CALL BEFORE 4:00 PM ? ? ?

## 2021-04-01 ENCOUNTER — Other Ambulatory Visit: Payer: Self-pay | Admitting: Cardiovascular Disease

## 2021-04-01 DIAGNOSIS — I2699 Other pulmonary embolism without acute cor pulmonale: Secondary | ICD-10-CM

## 2021-04-01 DIAGNOSIS — I824Y2 Acute embolism and thrombosis of unspecified deep veins of left proximal lower extremity: Secondary | ICD-10-CM

## 2021-04-03 NOTE — Telephone Encounter (Signed)
Prescription refill request for Eliquis received. ?Indication:Aflutter ?Last office visit:2/23 ?Scr:1.2 ?Age: 61 ?Weight:158.8 kg ? ?Prescription refilled ? ?

## 2021-04-07 ENCOUNTER — Other Ambulatory Visit: Payer: Self-pay

## 2021-04-07 MED ORDER — DAPAGLIFLOZIN PROPANEDIOL 10 MG PO TABS: ORAL_TABLET | ORAL | 3 refills | Status: DC

## 2021-04-11 ENCOUNTER — Telehealth: Payer: Self-pay

## 2021-04-11 ENCOUNTER — Other Ambulatory Visit: Payer: Self-pay

## 2021-04-11 MED ORDER — DAPAGLIFLOZIN PROPANEDIOL 10 MG PO TABS: ORAL_TABLET | ORAL | 3 refills | Status: DC

## 2021-04-11 NOTE — Telephone Encounter (Signed)
Prior Auth for Drew [name] approved thru cover my meds . Letter of approval processed to scan ?

## 2021-04-28 DIAGNOSIS — G4733 Obstructive sleep apnea (adult) (pediatric): Secondary | ICD-10-CM | POA: Diagnosis not present

## 2021-05-09 ENCOUNTER — Other Ambulatory Visit (HOSPITAL_COMMUNITY): Payer: Self-pay | Admitting: Cardiology

## 2021-05-23 NOTE — Telephone Encounter (Signed)
[facility] Misty Stanley) says his insurance does not require compliance meeting for billing in the first 90 days but he will have to get back on his therapy when he returns for his supplies and his heath benefit. ?

## 2021-05-28 DIAGNOSIS — G4733 Obstructive sleep apnea (adult) (pediatric): Secondary | ICD-10-CM | POA: Diagnosis not present

## 2021-06-01 ENCOUNTER — Telehealth: Payer: Self-pay | Admitting: *Deleted

## 2021-06-01 NOTE — Telephone Encounter (Signed)
Received documents for a medical records request from [facility] [place] - sent request to the [facility] through the intraoffice mail.

## 2021-06-15 ENCOUNTER — Other Ambulatory Visit (HOSPITAL_BASED_OUTPATIENT_CLINIC_OR_DEPARTMENT_OTHER): Payer: Self-pay

## 2021-06-15 ENCOUNTER — Encounter: Payer: Self-pay | Admitting: Family

## 2021-06-16 ENCOUNTER — Encounter: Payer: Self-pay | Admitting: Family

## 2021-06-23 ENCOUNTER — Other Ambulatory Visit: Payer: Self-pay | Admitting: Medical

## 2021-06-23 ENCOUNTER — Other Ambulatory Visit (HOSPITAL_BASED_OUTPATIENT_CLINIC_OR_DEPARTMENT_OTHER): Payer: Self-pay

## 2021-06-23 ENCOUNTER — Encounter: Payer: Self-pay | Admitting: Family

## 2021-06-23 ENCOUNTER — Other Ambulatory Visit: Payer: Self-pay

## 2021-06-23 MED ORDER — DIGOXIN 125 MCG PO TABS
0.1250 mg | ORAL_TABLET | Freq: Every day | ORAL | 0 refills | Status: DC
  Filled 2021-06-23: qty 30, 30d supply, fill #0
  Filled 2021-07-07: qty 90, 90d supply, fill #0

## 2021-06-26 ENCOUNTER — Other Ambulatory Visit (HOSPITAL_BASED_OUTPATIENT_CLINIC_OR_DEPARTMENT_OTHER): Payer: Self-pay

## 2021-06-27 ENCOUNTER — Encounter: Payer: Self-pay | Admitting: Family

## 2021-06-27 ENCOUNTER — Other Ambulatory Visit (HOSPITAL_BASED_OUTPATIENT_CLINIC_OR_DEPARTMENT_OTHER): Payer: Self-pay

## 2021-06-28 DIAGNOSIS — G4733 Obstructive sleep apnea (adult) (pediatric): Secondary | ICD-10-CM | POA: Diagnosis not present

## 2021-06-29 ENCOUNTER — Other Ambulatory Visit (HOSPITAL_COMMUNITY): Payer: Self-pay

## 2021-06-30 ENCOUNTER — Telehealth: Payer: Federal, State, Local not specified - PPO | Admitting: Cardiology

## 2021-06-30 ENCOUNTER — Encounter: Payer: Self-pay | Admitting: Family

## 2021-06-30 ENCOUNTER — Encounter: Payer: Self-pay | Admitting: Pharmacist

## 2021-06-30 NOTE — Telephone Encounter (Signed)
This encounter was created in error - please disregard.

## 2021-07-05 ENCOUNTER — Other Ambulatory Visit (HOSPITAL_BASED_OUTPATIENT_CLINIC_OR_DEPARTMENT_OTHER): Payer: Self-pay

## 2021-07-07 ENCOUNTER — Telehealth: Payer: Self-pay | Admitting: Pharmacist

## 2021-07-07 ENCOUNTER — Encounter (HOSPITAL_BASED_OUTPATIENT_CLINIC_OR_DEPARTMENT_OTHER): Payer: Self-pay

## 2021-07-07 ENCOUNTER — Other Ambulatory Visit (HOSPITAL_BASED_OUTPATIENT_CLINIC_OR_DEPARTMENT_OTHER): Payer: Self-pay

## 2021-07-07 MED ORDER — WEGOVY 2.4 MG/0.75ML ~~LOC~~ SOAJ
2.4000 mg | SUBCUTANEOUS | 3 refills | Status: DC
  Filled 2021-07-07: qty 3, 28d supply, fill #0
  Filled 2021-08-01: qty 3, 28d supply, fill #1
  Filled 2021-08-24: qty 3, 28d supply, fill #2
  Filled 2021-09-21: qty 3, 28d supply, fill #3
  Filled 2021-10-21: qty 3, 28d supply, fill #4
  Filled 2021-11-17: qty 3, 28d supply, fill #5
  Filled 2021-12-15: qty 3, 28d supply, fill #6
  Filled 2022-01-11: qty 3, 28d supply, fill #7
  Filled 2022-02-07: qty 3, 28d supply, fill #8
  Filled 2022-03-05 (×2): qty 3, 28d supply, fill #9
  Filled 2022-04-04: qty 3, 28d supply, fill #10
  Filled 2022-05-03: qty 3, 28d supply, fill #11

## 2021-07-10 ENCOUNTER — Other Ambulatory Visit (HOSPITAL_COMMUNITY): Payer: Self-pay

## 2021-07-10 ENCOUNTER — Other Ambulatory Visit (HOSPITAL_BASED_OUTPATIENT_CLINIC_OR_DEPARTMENT_OTHER): Payer: Self-pay

## 2021-07-10 MED ORDER — DIGOXIN 125 MCG PO TABS
0.1250 mg | ORAL_TABLET | Freq: Every day | ORAL | 2 refills | Status: DC
  Filled 2021-07-10: qty 90, 90d supply, fill #0

## 2021-07-11 ENCOUNTER — Other Ambulatory Visit (HOSPITAL_BASED_OUTPATIENT_CLINIC_OR_DEPARTMENT_OTHER): Payer: Self-pay

## 2021-07-11 ENCOUNTER — Encounter (HOSPITAL_COMMUNITY): Payer: Self-pay | Admitting: Cardiology

## 2021-07-11 ENCOUNTER — Ambulatory Visit (HOSPITAL_COMMUNITY)
Admission: RE | Admit: 2021-07-11 | Discharge: 2021-07-11 | Disposition: A | Discharge status: Home / Self Care | Payer: Federal, State, Local not specified - PPO | Source: Ambulatory Visit | Attending: Family Medicine | Admitting: Family Medicine

## 2021-07-11 ENCOUNTER — Ambulatory Visit (HOSPITAL_BASED_OUTPATIENT_CLINIC_OR_DEPARTMENT_OTHER)
Admission: RE | Admit: 2021-07-11 | Discharge: 2021-07-11 | Disposition: A | Discharge status: Home / Self Care | Payer: Federal, State, Local not specified - PPO | Source: Ambulatory Visit | Attending: Cardiology | Admitting: Cardiology

## 2021-07-11 VITALS — BP 104/80 | HR 66 | Wt 335.2 lb

## 2021-07-11 DIAGNOSIS — I110 Hypertensive heart disease with heart failure: Secondary | ICD-10-CM | POA: Insufficient documentation

## 2021-07-11 DIAGNOSIS — Z6841 Body Mass Index (BMI) 40.0 and over, adult: Secondary | ICD-10-CM | POA: Diagnosis not present

## 2021-07-11 DIAGNOSIS — F17200 Nicotine dependence, unspecified, uncomplicated: Secondary | ICD-10-CM | POA: Diagnosis not present

## 2021-07-11 DIAGNOSIS — I483 Typical atrial flutter: Secondary | ICD-10-CM | POA: Insufficient documentation

## 2021-07-11 DIAGNOSIS — I5022 Chronic systolic (congestive) heart failure: Secondary | ICD-10-CM | POA: Diagnosis not present

## 2021-07-11 DIAGNOSIS — E669 Obesity, unspecified: Secondary | ICD-10-CM | POA: Insufficient documentation

## 2021-07-11 DIAGNOSIS — G4733 Obstructive sleep apnea (adult) (pediatric): Secondary | ICD-10-CM | POA: Diagnosis not present

## 2021-07-11 DIAGNOSIS — I428 Other cardiomyopathies: Secondary | ICD-10-CM | POA: Diagnosis not present

## 2021-07-11 DIAGNOSIS — I2699 Other pulmonary embolism without acute cor pulmonale: Secondary | ICD-10-CM | POA: Diagnosis not present

## 2021-07-11 DIAGNOSIS — Z7901 Long term (current) use of anticoagulants: Secondary | ICD-10-CM | POA: Insufficient documentation

## 2021-07-11 DIAGNOSIS — Z79899 Other long term (current) drug therapy: Secondary | ICD-10-CM | POA: Insufficient documentation

## 2021-07-11 DIAGNOSIS — M179 Osteoarthritis of knee, unspecified: Secondary | ICD-10-CM | POA: Diagnosis not present

## 2021-07-11 LAB — BASIC METABOLIC PANEL
Anion gap: 9 (ref 5–15)
BUN: 20 mg/dL (ref 6–20)
CO2: 23 mmol/L (ref 22–32)
Calcium: 9 mg/dL (ref 8.9–10.3)
Chloride: 107 mmol/L (ref 98–111)
Creatinine, Ser: 1.17 mg/dL (ref 0.61–1.24)
GFR, Estimated: >60 mL/min (ref >60)
Glucose, Bld: 97 mg/dL (ref 70–99)
Potassium: 3.9 mmol/L (ref 3.5–5.1)
Sodium: 139 mmol/L (ref 135–145)

## 2021-07-11 LAB — ECHOCARDIOGRAM COMPLETE
Area-P 1/2: 3.79 cm2
S' Lateral: 4.9 cm

## 2021-07-11 MED ORDER — DIGOXIN 125 MCG PO TABS
0.1250 mg | ORAL_TABLET | Freq: Every day | ORAL | 3 refills | Status: DC
  Filled 2021-07-11: qty 90, 90d supply, fill #0
  Filled 2021-10-21: qty 30, 30d supply, fill #1
  Filled 2021-12-15: qty 30, 30d supply, fill #2
  Filled 2022-02-07: qty 30, 30d supply, fill #3
  Filled 2022-04-04: qty 30, 30d supply, fill #4
  Filled 2022-06-12: qty 30, 30d supply, fill #5

## 2021-07-11 MED ORDER — POTASSIUM CHLORIDE CRYS ER 20 MEQ PO TBCR
40.0000 meq | EXTENDED_RELEASE_TABLET | Freq: Every day | ORAL | 6 refills | Status: DC
  Filled 2021-07-11: qty 60, 30d supply, fill #0
  Filled 2021-09-21: qty 60, 30d supply, fill #1
  Filled 2021-10-21: qty 60, 30d supply, fill #2
  Filled 2021-12-27: qty 60, 30d supply, fill #3
  Filled 2022-04-04: qty 60, 30d supply, fill #4

## 2021-07-11 MED ORDER — CARVEDILOL 12.5 MG PO TABS
18.7500 mg | ORAL_TABLET | Freq: Two times a day (BID) | ORAL | 4 refills | Status: DC
  Filled 2021-07-11: qty 180, 60d supply, fill #0
  Filled 2021-10-21: qty 180, 60d supply, fill #1
  Filled 2021-12-27: qty 180, 60d supply, fill #2
  Filled 2022-04-04: qty 180, 60d supply, fill #3
  Filled 2022-06-27: qty 180, 60d supply, fill #4

## 2021-07-11 NOTE — Progress Notes (Signed)
Advanced Heart Failure Clinic Note    PCP: Esperanza Richters, PA-C Primary Cardiologist: Dr Servando Salina  HF Cardiologist: Dr. Shirlee Latch  HPI: 61 y.o. w/ h/o HTN, HLD, obesity, severe knee OA, tobacco use and NICM, first diagnosed in 05/2018 when admitted for acute CHF. Echo showed LVEF 20-25%. RV was moderately reduced. LHC showed normal cors.    Had 2D echo on 02/29/20 showing EF 25-30%. RV mild-moderately reduced.    Seen at [facility] (03/03/20) w/ complaints of 4 day h/o increased dyspnea w/ hypoxia and pleuritic chest pain. Noted to be in sinus tach w/ HR in the 130s. Given concern for PE, he was referred to [facility] ED where CT angio confirmed Rt middle and lower lobe PE. No evidence of RV strain. Admitted and started on IV heparin. LE Venous doppler + for Lt DVT.  Echo done (03/04/20) RV moderately enlarged w/ mild-moderately reduced systolic function. Not significantly changed from echo 02/29/20. BNP also normal at 27. He remained hemodynamically stable. Hospitalization c/b atrial flutter, s/p successful TEE with DCCV (03/07/20). He was discharged on GDMT, discharge weight 360 lbs.   Echo in 5/22 showed EF 35-40%, global hypokinesis, mild RVE with normal function, PASP 31 mmHg. Echo was done today and reviewed, EF 35-40% with diffuse hypokinesis, grade 2 diastolic dysfunction, mild LVH, moderate LV enlargement, mild RV enlargement, mildly decreased RV systolic function.   Today he returns for HF follow up.  Weight is down another 15 lbs, still on semaglutide.  He is short of breath carrying a heavy load or walking up stairs.  No dyspnea walking on flat ground.  No orthopnea/PND.  No chest pain.  Still with knee pain.   Labs (3/22): K 4, creatinine 1.3 Labs (8/22): K 3.7, creatinine 1.13, hgb 9.9, transferrin saturation 8% Labs (11/22): K 3.1, creatinine 1.04, hgb 11.1 Labs (2/23): LDL 115 Labs (3/23): digoxin 0.3, K 3.9, creationine 1.28  PMH: 1. HTN 2. Hyperlipidemia 3. Hypothyroidism 4. OA:  Knees 5. Prior smoker 6. Venous thromboembolism: Left DVT with PE in 2/22.  7. Atrial flutter: 2/22, s/p TEE-guided DCCV.  8. Chronic systolic CHF: Nonischemic cardiomyopathy.  - Echo (5/20): EF 20-25%.  - LHC/RHC (5/20): No significant CAD. Mean RA 6, PA 41/13, mean PCWP 20, CI 3.21.  - TEE (2/22): EF 20-25%, moderate LV dilation, mild RV enlargement with moderately decreased systolic function.  - Cardiac MRI (2/22): Moderate LV dilation with EF 42%, moderate RV dilation with EF 42%, nonspecific RV insertion site LGE.  - Echo (5/22): EF 35-40%, global hypokinesis, mild RVE with normal function, PASP 31 mmHg.  - Echo (6/23): EF 35-40% with diffuse hypokinesis, grade 2 diastolic dysfunction, mild LVH, moderate LV enlargement, mild RV enlargement, mildly decreased RV systolic function. 9. Obesity 10. Gout 11. Fe deficiency anemia 12. OSA  ROS: All systems negative except as listed in HPI, PMH and Problem List.  SH:  Social History   Socioeconomic History   Marital status: Married    Spouse name: Not on file   Number of children: Not on file   Years of education: Not on file   Highest education level: Not on file  Occupational History   Not on file  Tobacco Use   Smoking status: Former    Packs/day: 1.00    Years: 20.00    Total pack years: 20.00    Types: Cigarettes    Quit date: 05/23/2018    Years since quitting: 3.1   Smokeless tobacco: Never  Vaping Use  Vaping Use: Never used  Substance and Sexual Activity   Alcohol use: Yes    Alcohol/week: 0.0 standard drinks of alcohol    Comment: 6 pack a week spread out over weekend   Drug use: No   Sexual activity: Yes  Other Topics Concern   Not on file  Social History Narrative   [employer].   Married lives with wife, mother in law and daughter.  7 children.     Social Determinants of Health   Financial Resource Strain: High Risk (09/07/2020)   Overall Financial Resource Strain (CARDIA)    Difficulty of Paying Living  Expenses: Hard  Food Insecurity: No Food Insecurity (09/07/2020)   Hunger Vital Sign    Worried About Running Out of Food in the Last Year: Never true    Ran Out of Food in the Last Year: Never true  Transportation Needs: No Transportation Needs (09/07/2020)   PRAPARE - Administrator, Civil Service (Medical): No    Lack of Transportation (Non-Medical): No  Physical Activity: Unknown (05/24/2018)   Exercise Vital Sign    Days of Exercise per Week: Patient refused    Minutes of Exercise per Session: Patient refused  Stress: Not on file  Social Connections: Unknown (05/24/2018)   Social Connection and Isolation Panel [NHANES]    Frequency of Communication with Friends and Family: Patient refused    Frequency of Social Gatherings with Friends and Family: Patient refused    Attends Religious Services: Patient refused    Active Member of Clubs or Organizations: Patient refused    Attends Banker Meetings: Patient refused    Marital Status: Patient refused  Intimate Partner Violence: Unknown (05/24/2018)   Humiliation, Afraid, Rape, and Kick questionnaire    Fear of Current or Ex-Partner: Patient refused    Emotionally Abused: Patient refused    Physically Abused: Patient refused    Sexually Abused: Patient refused   FH:  Family History  Problem Relation Age of Onset   Hearing loss Mother    Cancer Father    Heart failure Other    Heart failure Paternal Grandfather    Current Outpatient Medications  Medication Sig Dispense Refill   albuterol (PROVENTIL HFA;VENTOLIN HFA) 108 (90 Base) MCG/ACT inhaler Inhale 2 puffs into the lungs every 6 (six) hours as needed for wheezing or shortness of breath. 1 Inhaler 2   allopurinol (ZYLOPRIM) 100 MG tablet TAKE 1 TABLET(100 MG) BY MOUTH DAILY 90 tablet 0   dapagliflozin propanediol (FARXIGA) 10 MG TABS tablet TAKE 1 TABLET(10 MG) BY MOUTH DAILY 90 tablet 3   ELIQUIS 5 MG TABS tablet TAKE 1 TABLET(5 MG) BY MOUTH TWICE DAILY 180  tablet 1   levothyroxine (SYNTHROID) 25 MCG tablet TAKE 1 TABLET BY MOUTH EVERY DAY BEFORE BREAKFAST 90 tablet 2   melatonin 3 MG TABS tablet Take 1 tablet (3 mg total) by mouth at bedtime. 90 tablet 3   sacubitril-valsartan (ENTRESTO) 97-103 MG Take 1 tablet by mouth 2 (two) times daily. 60 tablet 6   spironolactone (ALDACTONE) 25 MG tablet TAKE 1 TABLET(25 MG) BY MOUTH DAILY 30 tablet 5   torsemide (DEMADEX) 20 MG tablet TAKE 4 TABLETS(80 MG) BY MOUTH DAILY 120 tablet 6   WEGOVY 2.4 MG/0.75ML SOAJ Inject 2.4 mg into the skin once a week. 9 mL 3   carvedilol (COREG) 12.5 MG tablet Take 1 & 1/2 tablets (18.75 mg total) by mouth 2 (two) times daily with a meal. 180 tablet 4  digoxin (LANOXIN) 0.125 MG tablet Take 1 tablet (0.125 mg total) by mouth daily. 90 tablet 3   potassium chloride SA (KLOR-CON M) 20 MEQ tablet Take 2 tablets (40 mEq total) by mouth daily. 60 tablet 6   No current facility-administered medications for this encounter.    Vitals:   07/11/21 1016  BP: 104/80  Pulse: 66  SpO2: 97%  Weight: (!) 152 kg (335 lb 3.2 oz)   Wt Readings from Last 3 Encounters:  07/11/21 (!) 152 kg (335 lb 3.2 oz)  03/28/21 (!) 158.8 kg (350 lb)  03/07/21 (!) 165.5 kg (364 lb 12.8 oz)   PHYSICAL EXAM: General: NAD Neck: No JVD, no thyromegaly or thyroid nodule.  Lungs: Clear to auscultation bilaterally with normal respiratory effort. CV: Nondisplaced PMI.  Heart regular S1/S2, no S3/S4, no murmur.  No peripheral edema.  No carotid bruit.  Normal pedal pulses.  Abdomen: Soft, nontender, no hepatosplenomegaly, no distention.  Skin: Intact without lesions or rashes.  Neurologic: Alert and oriented x 3.  Psych: Normal affect. Extremities: No clubbing or cyanosis.  HEENT: Normal.   ASSESSMENT & PLAN: 1. PE possibly with infarction and LLE DVT: 2/22, unprovoked.  Patient has no FH of VTE.  He had not been sedentary, had been working full time at the post office. Echo in 2/22 with moderate  RV dilation/moderate dysfunction. Echo in 5/22 showed mild RV enlargement, normal RV function.  - Continue Eliquis long-term.  2. Atrial flutter: Typical flutter.  This was noted at when he was admitted for PE in 2/22.  Suspect this was triggered by PE.  DCCV in 2/22. He is in NSR today, no longer taking amiodarone.  - Continue anticoagulation therapy w/ Eliquis.    - If recurrence, should have atrial flutter ablation.   3. Chronic systolic CHF: Diagnosed in 2020.  Cath from 5/20 with no significant CAD.  Cause of CMP is uncertain, nonischemic cardiomyopathy. ?Viral myocarditis.  02/2020 cMRI LV/RV EF 42% with only nonspecific RV insertion site LGE.  Echo in 5/22 showed EF 35-40% with mild RVE and normal RV function.  Echo today with EF 35-40%, mild RV dysfunction.  Not volume overloaded on exam, but has NYHA class III symptoms.  - Increase Coreg to 18.75 mg bid.  - Continue spironolactone 25 mg daily. BMET today.  - Continue digoxin 0.125, check level today.  - Continue Entresto 97/103 bid.  - Continue torsemide 80 mg daily. - Continue dapagliflozin 10 mg daily.  - EF appears just out of range for ICD.  Narrow QRS so not candidate for CRT.  4. Obesity: Body mass index is 46.75 kg/m.  Obesity is a major contributor to his knee pain and may contribute to his cardiomyopathy.   - He has started semaglutide and has been losing weight.  - Arrange for [employer] PREP class to increase physical activity.  5. OSA: Use CPAP.  6. Knee OA: Knee pain is a major limitor.  He is not a surgical candidate at this time due to morbid obesity.  - Seeing orthopedics (Dr. August Saucer) and working on weight loss.    F/u APP 3 months.    Drew [name] 07/11/2021

## 2021-07-13 ENCOUNTER — Encounter: Payer: Self-pay | Admitting: Pharmacist

## 2021-07-13 NOTE — Telephone Encounter (Signed)
This encounter was created in error - please disregard.

## 2021-07-14 ENCOUNTER — Telehealth: Payer: Self-pay

## 2021-07-14 NOTE — Telephone Encounter (Signed)
Called to discuss and explain PREP program, talked with him and his wife, they both want to attend at [facility]; next class in August 1, every T/Th 10-11:15; will contact in July to set up assessment visit.

## 2021-07-20 ENCOUNTER — Other Ambulatory Visit: Payer: Self-pay | Admitting: Cardiology

## 2021-07-21 ENCOUNTER — Other Ambulatory Visit: Payer: Self-pay | Admitting: Cardiology

## 2021-07-28 ENCOUNTER — Telehealth: Payer: Self-pay

## 2021-07-28 NOTE — Telephone Encounter (Signed)
Tried calling again about upcoming PREP classes and schedule, does not have VM box set up on phone; sent another text message

## 2021-07-30 IMAGING — CT CT ANGIO CHEST
2 of 8 series · 18 of 36 positions shown · IV contrast (Omnipaque)
Comparison: Single-view of the chest today.

CLINICAL DATA: Shortness of breath for 4 days.

EXAM:
CT ANGIOGRAPHY CHEST WITH CONTRAST
TECHNIQUE: Multidetector CT imaging of the chest was performed using the
standard protocol during bolus administration of intravenous
contrast. Multiplanar CT image reconstructions and MIPs were
obtained to evaluate the vascular anatomy.
CONTRAST:  100 mL OMNIPAQUE IOHEXOL 350 MG/ML SOLN

[Series 6: pe coronal mpr · coronal · 0.62mm/px · 1 of 151 slices shown]
[im 76/151  mediastinal]
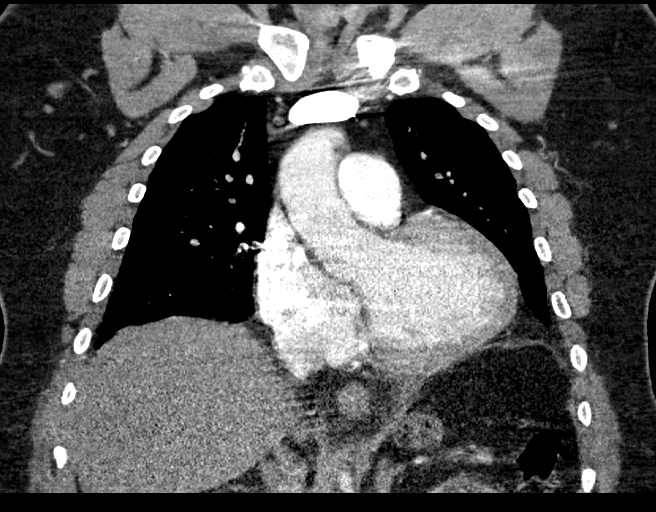

[Series 10: pe thins · axial · 0.84mm/px · z∈[-311,-41]mm · 17 of 302 slices shown]
[im 16/302  lung]
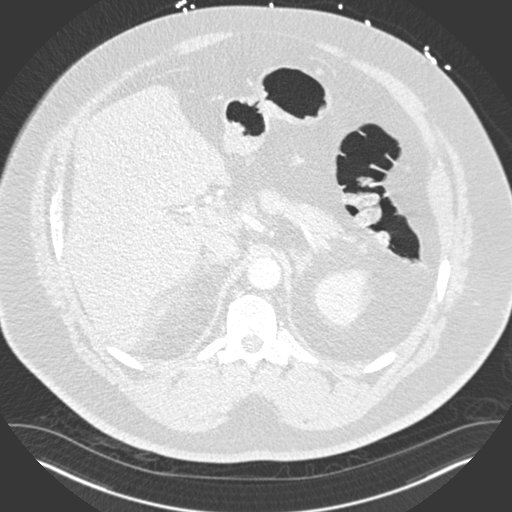
[im 32/302  mediastinal]
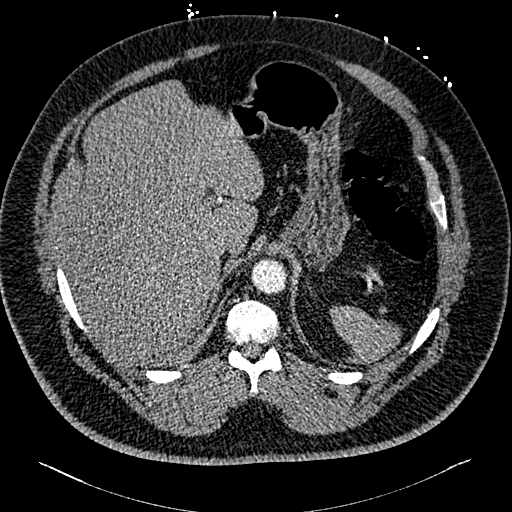
[im 48/302  lung]
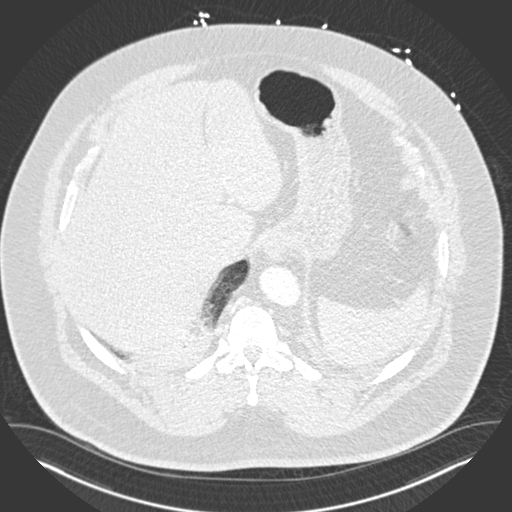
[im 64/302  mediastinal]
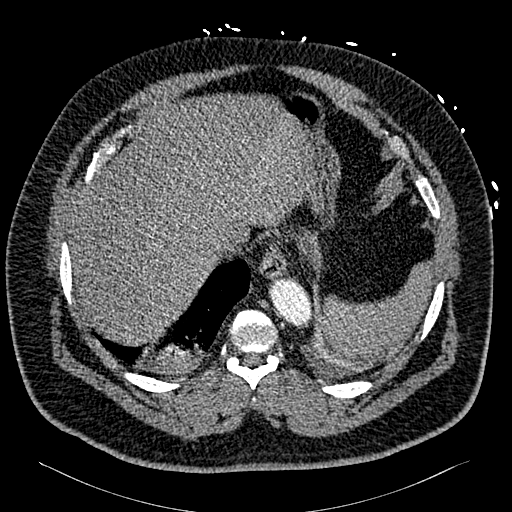
[im 80/302  lung]
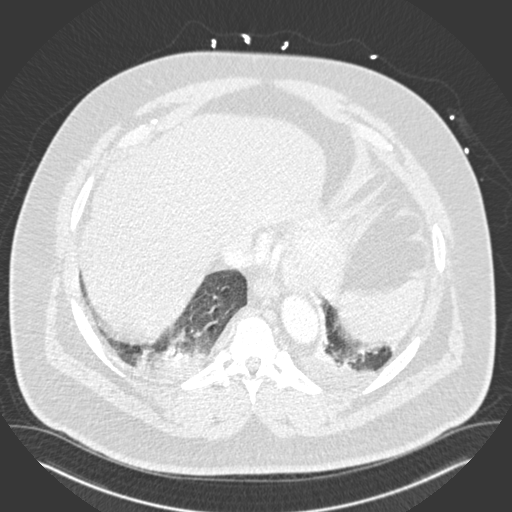
[im 96/302  mediastinal]
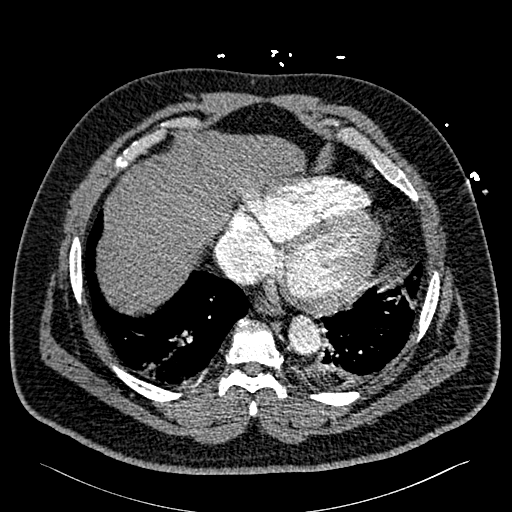
[im 111/302  lung]
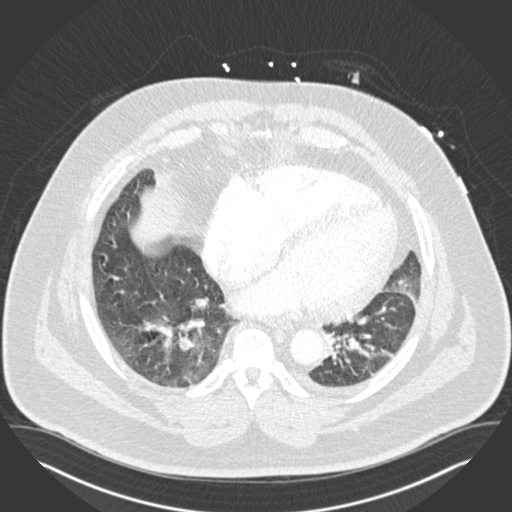
[im 127/302  mediastinal]
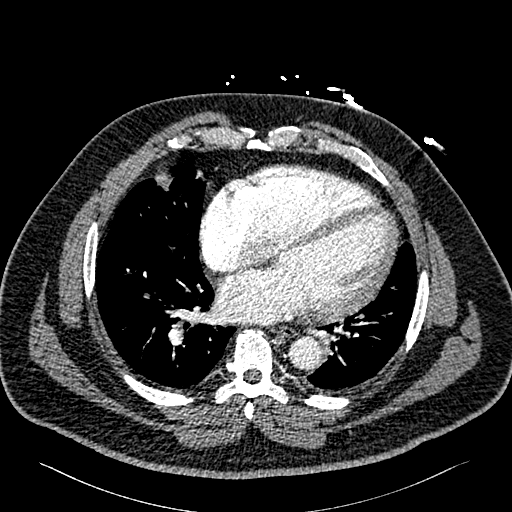
[im 159/302  lung]
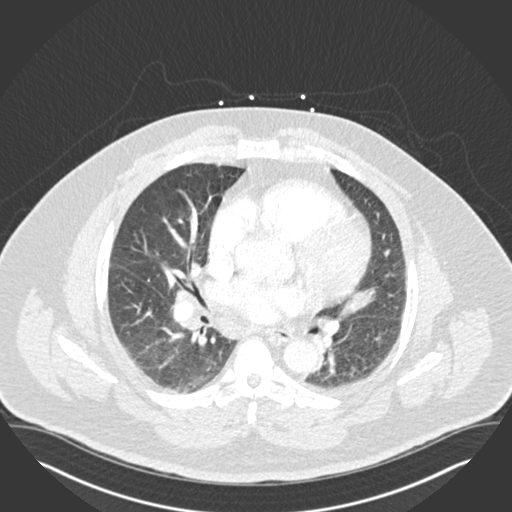
[im 175/302  mediastinal]
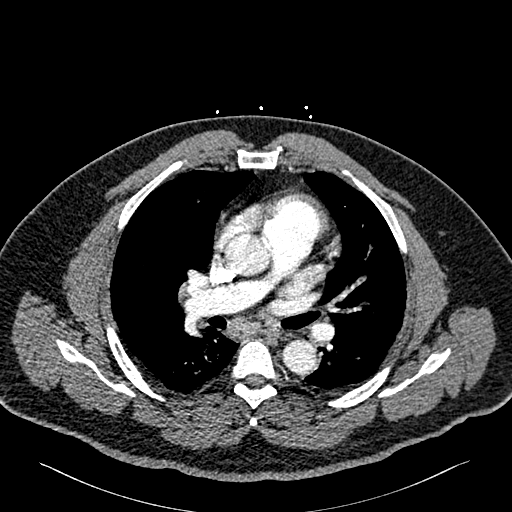
[im 191/302  lung]
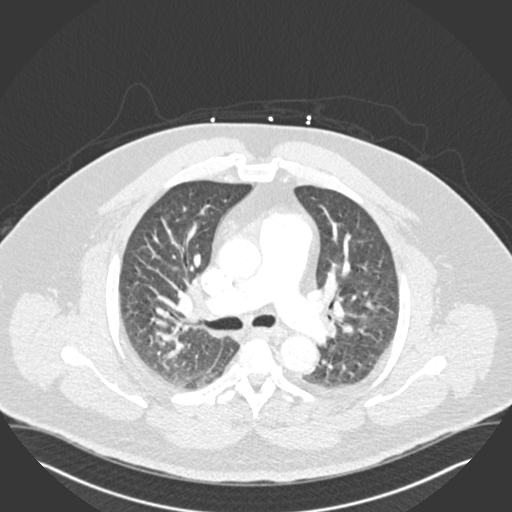
[im 206/302  mediastinal]
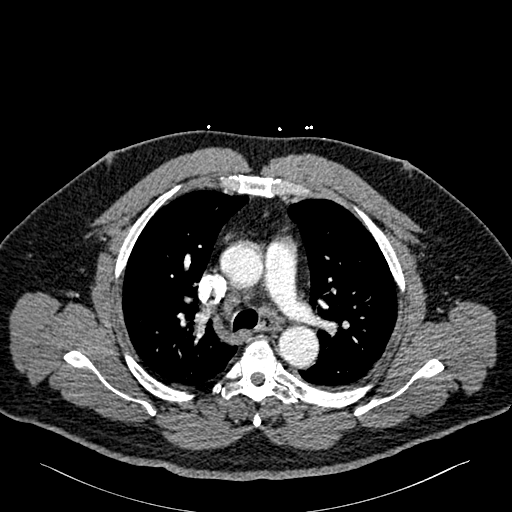
[im 222/302  lung]
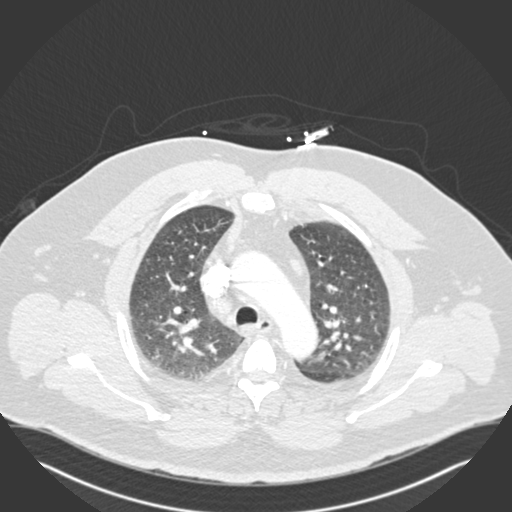
[im 238/302  mediastinal]
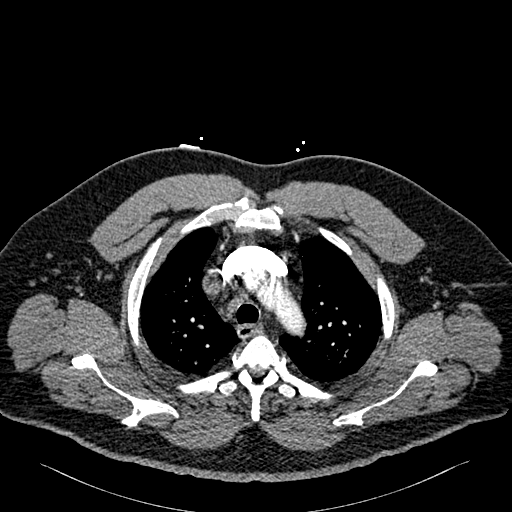
[im 254/302  lung]
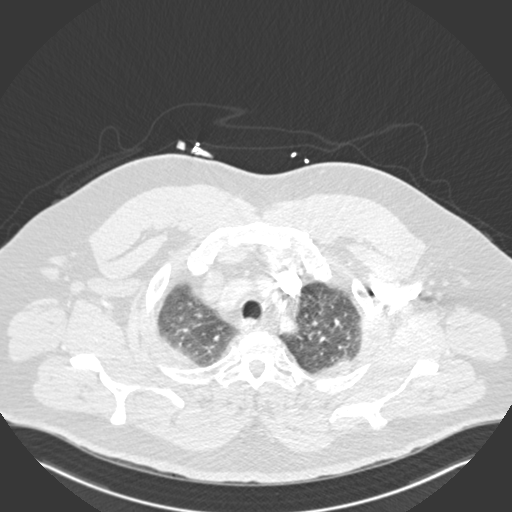
[im 270/302  mediastinal]
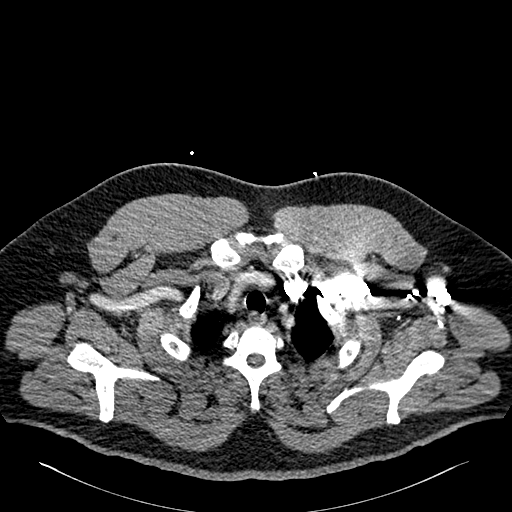
[im 286/302  lung]
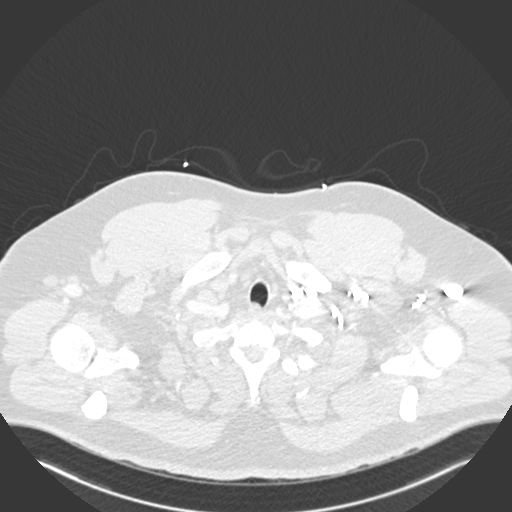

[18 of 36 positions shown; findings below may reference images not displayed]

FINDINGS: Cardiovascular: Pulmonary emboli are seen in interlobar pulmonary
artery branches bilaterally. There is no evidence of right heart
strain. Mild cardiomegaly. No pericardial effusion.

Mediastinum/Nodes: Small bilateral mediastinal and hilar lymph nodes
are identified. No pathologic lymphadenopathy by CT size criteria.
0.9 cm low attenuating lesion in the right lobe of the thyroid is
noted. Not clinically significant; no follow-up imaging recommended
(ref: [HOSPITAL]. [DATE]): 143-50).

Lungs/Pleura: Small bilateral pleural effusions. Wedge-shaped
opacities are seen in the right lower and right middle lobes
worrisome for pulmonary infarcts. There is also some dependent
atelectasis.

Upper Abdomen: There is fatty infiltration of the liver. Left renal
cyst is partially imaged.

Musculoskeletal: No acute or focal abnormality.

Review of the MIP images confirms the above findings.
IMPRESSION: The examination is positive for pulmonary emboli. There is no
evidence of right heart strain but wedge-shaped opacities in the
right middle and lower lobes are worrisome for pulmonary infarcts.

Very small bilateral pleural effusions.

Cardiomegaly.

Fatty infiltration of the liver.

Critical Value/emergent results were called by telephone at the time
of interpretation on 03/03/2020 at [DATE] to provider LUSINE JIM
, who verbally acknowledged these results.

## 2021-08-01 ENCOUNTER — Other Ambulatory Visit (HOSPITAL_BASED_OUTPATIENT_CLINIC_OR_DEPARTMENT_OTHER): Payer: Self-pay

## 2021-08-01 ENCOUNTER — Telehealth (INDEPENDENT_AMBULATORY_CARE_PROVIDER_SITE_OTHER): Payer: Federal, State, Local not specified - PPO | Admitting: Cardiology

## 2021-08-01 ENCOUNTER — Other Ambulatory Visit: Payer: Self-pay | Admitting: Cardiology

## 2021-08-01 VITALS — Ht 71.0 in | Wt 328.8 lb

## 2021-08-01 DIAGNOSIS — G4733 Obstructive sleep apnea (adult) (pediatric): Secondary | ICD-10-CM

## 2021-08-01 DIAGNOSIS — I10 Essential (primary) hypertension: Secondary | ICD-10-CM

## 2021-08-01 DIAGNOSIS — G4709 Other insomnia: Secondary | ICD-10-CM | POA: Diagnosis not present

## 2021-08-01 NOTE — Progress Notes (Signed)
Virtual Visit via Video Note   This visit type was conducted due to national recommendations for restrictions regarding the COVID-19 Pandemic (e.g. social distancing) in an effort to limit this patient's exposure and mitigate transmission in our community.  Due to his co-morbid illnesses, this patient is at least at moderate risk for complications without adequate follow up.  This format is felt to be most appropriate for this patient at this time.  All issues noted in this document were discussed and addressed.  A limited physical exam was performed with this format.  Please refer to the patient's chart for his consent to telehealth for [name].    Date:  08/01/2021   ID:  Drew [name], DOB 03/13/1960, MRN [id number] The patient was identified using 2 identifiers.  Patient Location: Home Provider Location: Home Office   PCP:  Saguier, Ramon Dredge, PA-C   [facility] Providers Cardiologist:  Thomasene Ripple, DO     Evaluation Performed:  Follow-Up Visit  Chief Complaint:  OSA  History of Present Illness:    Drew [name] is a 61 y.o. male with chronic combined systolic CHF, dilated cardiomyopathy, hypertension, hyperlipidemia, obesity, nonischemic cardiomyopathy and pulmonary embolism who was referred by Dr. Shirlee Latch for evaluation of possible obstructive sleep apnea.  He underwent home sleep study showing severe obstructive sleep apnea with an AHI of 61/h and no significant central sleep apnea.  He had nocturnal hypoxemia with O2 sats less than 88% for 23 minutes of total sleep time.  He underwent CPAP titration to 17 cm H2O as well as O2 at 2 L nightly via CPAP.  Unfortunately he did not tolerate the pressure and would wake up at night and have to take the mask off and will start choking.  He was switched to auto CPAP from 4 to 20 cm H2O.   He is doing well with his CPAP device and thinks that he has gotten used to it.  He tolerates the mask and feels the pressure is adequate.   Since going on CPAP he feels rested in the am and has no significant daytime sleepiness.  He denies any significant mouth or nasal dryness or nasal congestion.  He does not think that he snores.     Past Medical History:  Diagnosis Date   Acute CHF (congestive heart failure) (HCC) 05/24/2018   Acute on chronic combined systolic and diastolic CHF (congestive heart failure) (HCC)    Acute systolic HF (heart failure) (HCC) 06/02/2018   AKI (acute kidney injury) (HCC) 03/03/2020   Alcohol use 05/24/2018   Allergy    Chronic systolic CHF (congestive heart failure) (HCC) 03/03/2020   Dilated cardiomyopathy (HCC)    Essential hypertension 05/24/2018   HFrEF (heart failure with reduced ejection fraction) (HCC) 11/20/2019   HTN (hypertension)    Hyperlipidemia 05/24/2018   Hypokalemia 03/03/2020   IDA (iron deficiency anemia) 08/23/2020   Knee pain, left 02/11/2014   New onset of congestive heart failure (HCC) 05/23/2018   Nonischemic cardiomyopathy (HCC) 11/20/2019   Obesity, Class III, BMI 40-49.9 (morbid obesity) (HCC) 05/24/2018   Pulmonary embolism (HCC) 03/03/2020   Seasonal allergies 02/11/2014   Tobacco abuse 05/24/2018   Typical atrial flutter (HCC)    Wellness examination 02/26/2014   Past Surgical History:  Procedure Laterality Date   CARDIOVERSION N/A 03/07/2020   Procedure: CARDIOVERSION;  Surgeon: Laurey Morale, MD;  Location: [facility] ENDOSCOPY;  Service: Cardiovascular;  Laterality: N/A;   KNEE ARTHROSCOPY  2009   RIGHT/LEFT HEART  CATH AND CORONARY ANGIOGRAPHY N/A 05/26/2018   Procedure: RIGHT/LEFT HEART CATH AND CORONARY ANGIOGRAPHY;  Surgeon: Lyn Records, MD;  Location: [facility] INVASIVE CV LAB;  Service: Cardiovascular;  Laterality: N/A;   TEE WITHOUT CARDIOVERSION N/A 03/07/2020   Procedure: TRANSESOPHAGEAL ECHOCARDIOGRAM (TEE);  Surgeon: Laurey Morale, MD;  Location: [facility] ENDOSCOPY;  Service: Cardiovascular;  Laterality: N/A;   THROAT SURGERY  2005     Current Meds  Medication Sig   albuterol  (PROVENTIL HFA;VENTOLIN HFA) 108 (90 Base) MCG/ACT inhaler Inhale 2 puffs into the lungs every 6 (six) hours as needed for wheezing or shortness of breath.   allopurinol (ZYLOPRIM) 100 MG tablet TAKE 1 TABLET(100 MG) BY MOUTH DAILY   carvedilol (COREG) 12.5 MG tablet Take 1 & 1/2 tablets (18.75 mg total) by mouth 2 (two) times daily with a meal.   dapagliflozin propanediol (FARXIGA) 10 MG TABS tablet TAKE 1 TABLET(10 MG) BY MOUTH DAILY   digoxin (LANOXIN) 0.125 MG tablet Take 1 tablet (0.125 mg total) by mouth daily.   ELIQUIS 5 MG TABS tablet TAKE 1 TABLET(5 MG) BY MOUTH TWICE DAILY   levothyroxine (SYNTHROID) 25 MCG tablet TAKE 1 TABLET BY MOUTH EVERY DAY BEFORE BREAKFAST   melatonin 3 MG TABS tablet Take 1 tablet (3 mg total) by mouth at bedtime.   potassium chloride SA (KLOR-CON M) 20 MEQ tablet Take 2 tablets (40 mEq total) by mouth daily.   sacubitril-valsartan (ENTRESTO) 97-103 MG Take 1 tablet by mouth 2 (two) times daily.   spironolactone (ALDACTONE) 25 MG tablet TAKE 1 TABLET(25 MG) BY MOUTH DAILY   torsemide (DEMADEX) 20 MG tablet TAKE 4 TABLETS(80 MG) BY MOUTH DAILY   WEGOVY 2.4 MG/0.75ML SOAJ Inject 2.4 mg into the skin once a week.     Allergies:   Tetracyclines & related   Social History   Tobacco Use   Smoking status: Former    Packs/day: 1.00    Years: 20.00    Total pack years: 20.00    Types: Cigarettes    Quit date: 05/23/2018    Years since quitting: 3.1   Smokeless tobacco: Never  Vaping Use   Vaping Use: Never used  Substance Use Topics   Alcohol use: Yes    Alcohol/week: 0.0 standard drinks of alcohol    Comment: 6 pack a week spread out over weekend   Drug use: No     Family Hx: The patient's family history includes Cancer in his father; Hearing loss in his mother; Heart failure in his paternal grandfather and another family member.  ROS:   Please see the history of present illness.     All other systems reviewed and are negative.   Prior CV  studies:   The following studies were reviewed today:  HST, CPAP titration  Labs/Other Tests and Data Reviewed:    EKG: Not performed  Recent Labs: 09/07/2020: B Natriuretic Peptide 13.2 02/22/2021: ALT 14; Hemoglobin 13.0; Platelet Count 264 07/11/2021: BUN 20; Creatinine, Ser 1.17; Potassium 3.9; Sodium 139   Recent Lipid Panel Lab Results  Component Value Date/Time   CHOL 177 03/07/2021 09:41 AM   TRIG 95 03/07/2021 09:41 AM   HDL 45 03/07/2021 09:41 AM   CHOLHDL 3.9 03/07/2021 09:41 AM   CHOLHDL 4.2 05/25/2018 03:43 AM   LDLCALC 115 (H) 03/07/2021 09:41 AM    Wt Readings from Last 3 Encounters:  08/01/21 (!) 328 lb 12.8 oz (149.1 kg)  07/11/21 (!) 335 lb 3.2 oz (152 kg)  03/28/21 Marland Kitchen)  350 lb (158.8 kg)     Risk Assessment/Calculations:          Objective:    Vital Signs:  Ht 5\' 11"  (1.803 m)   Wt (!) 328 lb 12.8 oz (149.1 kg)   BMI 45.86 kg/m   Well nourished, well developed male in no acute distress. Well appearing, alert and conversant, regular work of breathing,  good skin color  Eyes- anicteric mouth- oral mucosa is pink  neuro- grossly intact skin- no apparent rash or lesions or cyanosis  ASSESSMENT & PLAN:     OSA - The patient is tolerating PAP therapy well without any problems. The PAP download performed by his DME was personally reviewed and interpreted by me today and showed an AHI of 1.5/hr on auto CPAP from 4 to 20 cm H2O with 3% compliance in using more than 4 hours nightly.  The patient has been using and benefiting from PAP use and will continue to benefit from therapy.  -encouraged him to be more compliant with his device>>he promised Dr. that he would start using it more    2.  HTN -BP controlled at home -continue prescription drug management with Carvedilol 12.5mg  BID, spiro 25mg  daily and Entresto 97-103mg  BID with PRN refills -I have personally reviewed and interpreted outside labs performed by patient's PCP which showed  SCr 1.17 and  K+ 3.9 on 06/2021   3.  Insomnia - he used to work nights for years and so he has never been able to get back into routine nightly sleep. - he bought that melatonin but has not used it yet  Time:   Today, I have spent 10 minutes with the patient with telehealth technology discussing the above problems.     Medication Adjustments/Labs and Tests Ordered: Current medicines are reviewed at length with the patient today.  Concerns regarding medicines are outlined above.   Tests Ordered: No orders of the defined types were placed in this encounter.   Medication Changes: No orders of the defined types were placed in this encounter.   Follow Up:  Virtual Visit  in 1 year  Signed, , MD  08/01/2021 1:44 PM    [facility]

## 2021-08-01 NOTE — Patient Instructions (Signed)

## 2021-08-10 ENCOUNTER — Encounter: Payer: Self-pay | Admitting: Cardiology

## 2021-08-10 ENCOUNTER — Other Ambulatory Visit: Payer: Self-pay | Admitting: Cardiology

## 2021-08-22 ENCOUNTER — Inpatient Hospital Stay: Payer: Federal, State, Local not specified - PPO | Attending: Hematology & Oncology

## 2021-08-22 ENCOUNTER — Other Ambulatory Visit: Payer: Self-pay

## 2021-08-22 ENCOUNTER — Inpatient Hospital Stay (HOSPITAL_BASED_OUTPATIENT_CLINIC_OR_DEPARTMENT_OTHER): Payer: Federal, State, Local not specified - PPO | Admitting: Family

## 2021-08-22 ENCOUNTER — Encounter: Payer: Self-pay | Admitting: Family

## 2021-08-22 VITALS — BP 100/47 | HR 64 | Temp 98.0°F | Resp 18 | Ht 71.0 in | Wt 330.1 lb

## 2021-08-22 DIAGNOSIS — I2699 Other pulmonary embolism without acute cor pulmonale: Secondary | ICD-10-CM

## 2021-08-22 DIAGNOSIS — Z86718 Personal history of other venous thrombosis and embolism: Secondary | ICD-10-CM | POA: Diagnosis not present

## 2021-08-22 DIAGNOSIS — I824Y2 Acute embolism and thrombosis of unspecified deep veins of left proximal lower extremity: Secondary | ICD-10-CM

## 2021-08-22 DIAGNOSIS — Z86711 Personal history of pulmonary embolism: Secondary | ICD-10-CM | POA: Diagnosis not present

## 2021-08-22 DIAGNOSIS — K921 Melena: Secondary | ICD-10-CM | POA: Diagnosis not present

## 2021-08-22 DIAGNOSIS — D509 Iron deficiency anemia, unspecified: Secondary | ICD-10-CM

## 2021-08-22 DIAGNOSIS — Z7901 Long term (current) use of anticoagulants: Secondary | ICD-10-CM | POA: Diagnosis not present

## 2021-08-22 DIAGNOSIS — Z79899 Other long term (current) drug therapy: Secondary | ICD-10-CM | POA: Diagnosis not present

## 2021-08-22 LAB — CBC WITH DIFFERENTIAL (CANCER CENTER ONLY)
Abs Immature Granulocytes: 0.02 10*3/uL (ref 0.00–0.07)
Basophils Absolute: 0.1 10*3/uL (ref 0.0–0.1)
Basophils Relative: 1 %
Eosinophils Absolute: 0.4 10*3/uL (ref 0.0–0.5)
Eosinophils Relative: 6 %
HCT: 34.6 % — ABNORMAL LOW (ref 39.0–52.0)
Hemoglobin: 11.7 g/dL — ABNORMAL LOW (ref 13.0–17.0)
Immature Granulocytes: 0 %
Lymphocytes Relative: 29 %
Lymphs Abs: 1.9 10*3/uL (ref 0.7–4.0)
MCH: 32.2 pg (ref 26.0–34.0)
MCHC: 33.8 g/dL (ref 30.0–36.0)
MCV: 95.3 fL (ref 80.0–100.0)
Monocytes Absolute: 0.5 10*3/uL (ref 0.1–1.0)
Monocytes Relative: 7 %
Neutro Abs: 3.7 10*3/uL (ref 1.7–7.7)
Neutrophils Relative %: 57 %
Platelet Count: 230 10*3/uL (ref 150–400)
RBC: 3.63 MIL/uL — ABNORMAL LOW (ref 4.22–5.81)
RDW: 13.6 % (ref 11.5–15.5)
WBC Count: 6.5 10*3/uL (ref 4.0–10.5)
nRBC: 0 % (ref 0.0–0.2)

## 2021-08-22 LAB — CMP (CANCER CENTER ONLY)
ALT: 10 U/L (ref 0–44)
AST: 10 U/L — ABNORMAL LOW (ref 15–41)
Albumin: 4.4 g/dL (ref 3.5–5.0)
Alkaline Phosphatase: 38 U/L (ref 38–126)
Anion gap: 9 (ref 5–15)
BUN: 12 mg/dL (ref 6–20)
CO2: 24 mmol/L (ref 22–32)
Calcium: 9.3 mg/dL (ref 8.9–10.3)
Chloride: 106 mmol/L (ref 98–111)
Creatinine: 0.97 mg/dL (ref 0.61–1.24)
GFR, Estimated: >60 mL/min (ref >60)
Glucose, Bld: 93 mg/dL (ref 70–99)
Potassium: 4 mmol/L (ref 3.5–5.1)
Sodium: 139 mmol/L (ref 135–145)
Total Bilirubin: 0.4 mg/dL (ref 0.3–1.2)
Total Protein: 6.9 g/dL (ref 6.5–8.1)

## 2021-08-22 LAB — IRON AND IRON BINDING CAPACITY (CC-WL,HP ONLY)
Iron: 69 ug/dL (ref 45–182)
Saturation Ratios: 20 % (ref 17.9–39.5)
TIBC: 343 ug/dL (ref 250–450)
UIBC: 274 ug/dL (ref 117–376)

## 2021-08-22 LAB — FERRITIN: Ferritin: 41 ng/mL (ref 24–336)

## 2021-08-22 NOTE — Progress Notes (Signed)
Hematology and Oncology Follow Up Visit  EZELL POKE [id number] 12/19/1960 61 y.o. 08/22/2021   Principle Diagnosis:  Right lower and middle lobe PEs with no evidence of right heart strain  Left lower extremity DVT involving the left peroneal veins  Iron deficiency anemia    Current Therapy:        Eliquis 5 mg PO BID - lifelong IV iron as indicated   Interim History:  Mr. Halm is here today for follow-up. He is doing well and has no new complaints at this time.  He now has insurance through his wife and would like a referral to GI. He has had blood in his stool at times. No other blood loss noted.  No bruising or petechiae.  Hgb is 11.7, MCV 95, platelets 230 and WBC count 6.5.  No fever, chills, n/v, cough, rash, dizziness, SOB, chest pain, palpitations, abdominal pain or changes in bowel or bladder habits.  No swelling or tenderness in his extremities at this time.  He has positional numbness and tingling in his lower extremities when sitting in certain chairs. This resolves once he is able to move.  No falls or syncope.  Appetite and hydration are good. Weight is 330 lbs.   ECOG Performance Status: 1 - Symptomatic but completely ambulatory  Medications:  Allergies as of 08/22/2021       Reactions   Tetracyclines & Related Hives, Itching        Medication List        Accurate as of August 22, 2021 10:52 AM. If you have any questions, ask your nurse or doctor.          albuterol 108 (90 Base) MCG/ACT inhaler Commonly known as: VENTOLIN HFA Inhale 2 puffs into the lungs every 6 (six) hours as needed for wheezing or shortness of breath.   allopurinol 100 MG tablet Commonly known as: ZYLOPRIM TAKE 1 TABLET(100 MG) BY MOUTH DAILY   carvedilol 12.5 MG tablet Commonly known as: COREG Take 1 & 1/2 tablets (18.75 mg total) by mouth 2 (two) times daily with a meal.   dapagliflozin propanediol 10 MG Tabs tablet Commonly known as: Farxiga TAKE 1 TABLET(10 MG) BY  MOUTH DAILY   digoxin 0.125 MG tablet Commonly known as: LANOXIN Take 1 tablet (0.125 mg total) by mouth daily.   Eliquis 5 MG Tabs tablet Generic drug: apixaban TAKE 1 TABLET(5 MG) BY MOUTH TWICE DAILY   Entresto 97-103 MG Generic drug: sacubitril-valsartan Take 1 tablet by mouth 2 (two) times daily.   levothyroxine 25 MCG tablet Commonly known as: SYNTHROID TAKE 1 TABLET BY MOUTH EVERY DAY BEFORE BREAKFAST   melatonin 3 MG Tabs tablet Take 1 tablet (3 mg total) by mouth at bedtime.   potassium chloride SA 20 MEQ tablet Commonly known as: KLOR-CON M Take 2 tablets (40 mEq total) by mouth daily.   spironolactone 25 MG tablet Commonly known as: ALDACTONE TAKE 1 TABLET(25 MG) BY MOUTH DAILY   torsemide 20 MG tablet Commonly known as: DEMADEX TAKE 4 TABLETS(80 MG) BY MOUTH DAILY   Wegovy 2.4 MG/0.75ML Soaj Generic drug: Semaglutide-Weight Management Inject 2.4 mg into the skin once a week.        Allergies:  Allergies  Allergen Reactions   Tetracyclines & Related Hives and Itching    Past Medical History, Surgical history, Social history, and Family History were reviewed and updated.  Review of Systems: All other 10 point review of systems is negative.   Physical Exam:  vitals were not taken for this visit.   Wt Readings from Last 3 Encounters:  08/01/21 (!) 328 lb 12.8 oz (149.1 kg)  07/11/21 (!) 335 lb 3.2 oz (152 kg)  03/28/21 (!) 350 lb (158.8 kg)    Ocular: Sclerae unicteric, pupils equal, round and reactive to light Ear-nose-throat: Oropharynx clear, dentition fair Lymphatic: No cervical or supraclavicular adenopathy Lungs no rales or rhonchi, good excursion bilaterally Heart regular rate and rhythm, no murmur appreciated Abd soft, nontender, positive bowel sounds MSK no focal spinal tenderness, no joint edema Neuro: non-focal, well-oriented, appropriate affect Breasts: Deferred   Lab Results  Component Value Date   WBC 6.5 08/22/2021   HGB  11.7 (L) 08/22/2021   HCT 34.6 (L) 08/22/2021   MCV 95.3 08/22/2021   PLT 230 08/22/2021   Lab Results  Component Value Date   FERRITIN 32 02/22/2021   IRON 49 02/22/2021   TIBC 372 02/22/2021   UIBC 323 02/22/2021   IRONPCTSAT 13 (L) 02/22/2021   Lab Results  Component Value Date   RETICCTPCT 1.9 11/23/2020   RBC 3.63 (L) 08/22/2021   No results found for: "KPAFRELGTCHN", "LAMBDASER", "KAPLAMBRATIO" No results found for: "IGGSERUM", "IGA", "IGMSERUM" No results found for: "TOTALPROTELP", "ALBUMINELP", "A1GS", "A2GS", "BETS", "BETA2SER", "GAMS", "MSPIKE", "SPEI"   Chemistry      Component Value Date/Time   NA 139 07/11/2021 1109   NA 146 (H) 02/05/2020 1114   K 3.9 07/11/2021 1109   CL 107 07/11/2021 1109   CO2 23 07/11/2021 1109   BUN 20 07/11/2021 1109   BUN 16 02/05/2020 1114   CREATININE 1.17 07/11/2021 1109   CREATININE 1.01 02/22/2021 1117   CREATININE 1.26 11/13/2019 1454      Component Value Date/Time   CALCIUM 9.0 07/11/2021 1109   ALKPHOS 53 02/22/2021 1117   AST 13 (L) 02/22/2021 1117   ALT 14 02/22/2021 1117   BILITOT 0.4 02/22/2021 1117       Impression and Plan: Mr. Panek is a very pleasant 61 yo African American gentleman with history of right lower and middle lobe PEs with no evidence of right heart strain and left lower extremity DVT involving the left peroneal veins with atrial flutter (TEE and cardioversion on 03/07/20). DVT and PE have resolved on follow-up scans in 2022.  GI referral placed.  Continue to regimen with Eliquis, lifelong.  Iron studies are pending. We will replace if needed.  Follow-up in 6 months.   Eileen Stanford, NP 8/8/202310:52 AM

## 2021-08-23 ENCOUNTER — Telehealth: Payer: Self-pay | Admitting: *Deleted

## 2021-08-23 NOTE — Telephone Encounter (Signed)
Per 08/22/21 los - called and gave upcoming appointments - confirmed

## 2021-08-25 ENCOUNTER — Other Ambulatory Visit (HOSPITAL_BASED_OUTPATIENT_CLINIC_OR_DEPARTMENT_OTHER): Payer: Self-pay

## 2021-08-31 ENCOUNTER — Other Ambulatory Visit (HOSPITAL_BASED_OUTPATIENT_CLINIC_OR_DEPARTMENT_OTHER): Payer: Self-pay

## 2021-08-31 ENCOUNTER — Other Ambulatory Visit: Payer: Self-pay

## 2021-08-31 DIAGNOSIS — I824Y2 Acute embolism and thrombosis of unspecified deep veins of left proximal lower extremity: Secondary | ICD-10-CM

## 2021-08-31 DIAGNOSIS — I2699 Other pulmonary embolism without acute cor pulmonale: Secondary | ICD-10-CM

## 2021-08-31 MED ORDER — LEVOTHYROXINE SODIUM 25 MCG PO TABS
25.0000 ug | ORAL_TABLET | Freq: Every day | ORAL | 2 refills | Status: DC
  Filled 2021-08-31 – 2021-10-21 (×3): qty 90, 90d supply, fill #0

## 2021-08-31 MED ORDER — DAPAGLIFLOZIN PROPANEDIOL 10 MG PO TABS
10.0000 mg | ORAL_TABLET | Freq: Every day | ORAL | 3 refills | Status: DC
  Filled 2021-08-31 – 2021-10-21 (×2): qty 90, 90d supply, fill #0
  Filled 2022-04-04: qty 90, 90d supply, fill #1
  Filled 2022-08-30: qty 90, 90d supply, fill #2

## 2021-08-31 MED ORDER — ALBUTEROL SULFATE HFA 108 (90 BASE) MCG/ACT IN AERS
2.0000 | INHALATION_SPRAY | Freq: Four times a day (QID) | RESPIRATORY_TRACT | 2 refills | Status: DC | PRN
  Filled 2021-08-31: qty 6.7, 30d supply, fill #0
  Filled 2021-12-27: qty 6.7, 30d supply, fill #1
  Filled 2022-03-05 (×2): qty 6.7, 30d supply, fill #2

## 2021-08-31 MED ORDER — SPIRONOLACTONE 25 MG PO TABS
25.0000 mg | ORAL_TABLET | Freq: Every day | ORAL | 2 refills | Status: DC
  Filled 2021-08-31: qty 90, 90d supply, fill #0

## 2021-08-31 MED ORDER — ENTRESTO 97-103 MG PO TABS
1.0000 | ORAL_TABLET | Freq: Two times a day (BID) | ORAL | 2 refills | Status: DC
  Filled 2021-08-31: qty 180, 90d supply, fill #0
  Filled 2021-12-27: qty 180, 90d supply, fill #1
  Filled 2022-04-04: qty 180, 90d supply, fill #2

## 2021-08-31 MED ORDER — APIXABAN 5 MG PO TABS
5.0000 mg | ORAL_TABLET | Freq: Two times a day (BID) | ORAL | 2 refills | Status: DC
  Filled 2021-08-31: qty 60, 30d supply, fill #0
  Filled 2021-10-21: qty 60, 30d supply, fill #1
  Filled 2021-12-27: qty 60, 30d supply, fill #2
  Filled 2022-04-04: qty 60, 30d supply, fill #3
  Filled 2022-08-30: qty 60, 30d supply, fill #4

## 2021-08-31 NOTE — Telephone Encounter (Signed)
Prescription refill request for Eliquis received. Indication:Aflutter Last office visit:7/23 Scr:0.9 Age: 61 Weight:149.7 kg  Prescription refilled

## 2021-09-01 ENCOUNTER — Encounter: Payer: Self-pay | Admitting: Family

## 2021-09-01 ENCOUNTER — Other Ambulatory Visit (HOSPITAL_BASED_OUTPATIENT_CLINIC_OR_DEPARTMENT_OTHER): Payer: Self-pay

## 2021-09-04 ENCOUNTER — Other Ambulatory Visit (HOSPITAL_BASED_OUTPATIENT_CLINIC_OR_DEPARTMENT_OTHER): Payer: Self-pay

## 2021-09-04 ENCOUNTER — Encounter: Payer: Self-pay | Admitting: Family

## 2021-09-05 ENCOUNTER — Other Ambulatory Visit (HOSPITAL_BASED_OUTPATIENT_CLINIC_OR_DEPARTMENT_OTHER): Payer: Self-pay

## 2021-09-21 ENCOUNTER — Other Ambulatory Visit (HOSPITAL_BASED_OUTPATIENT_CLINIC_OR_DEPARTMENT_OTHER): Payer: Self-pay

## 2021-09-25 ENCOUNTER — Other Ambulatory Visit (HOSPITAL_BASED_OUTPATIENT_CLINIC_OR_DEPARTMENT_OTHER): Payer: Self-pay

## 2021-09-28 ENCOUNTER — Other Ambulatory Visit (HOSPITAL_BASED_OUTPATIENT_CLINIC_OR_DEPARTMENT_OTHER): Payer: Self-pay

## 2021-10-10 ENCOUNTER — Other Ambulatory Visit: Payer: Self-pay | Admitting: Cardiology

## 2021-10-11 ENCOUNTER — Encounter (HOSPITAL_COMMUNITY): Payer: Self-pay

## 2021-10-11 ENCOUNTER — Ambulatory Visit (HOSPITAL_COMMUNITY)
Admission: RE | Admit: 2021-10-11 | Discharge: 2021-10-11 | Disposition: A | Discharge status: Home / Self Care | Payer: Federal, State, Local not specified - PPO | Source: Ambulatory Visit | Attending: Family Medicine | Admitting: Family Medicine

## 2021-10-11 VITALS — BP 116/74 | HR 53 | Wt 321.8 lb

## 2021-10-11 DIAGNOSIS — I82402 Acute embolism and thrombosis of unspecified deep veins of left lower extremity: Secondary | ICD-10-CM | POA: Insufficient documentation

## 2021-10-11 DIAGNOSIS — Z79899 Other long term (current) drug therapy: Secondary | ICD-10-CM | POA: Diagnosis not present

## 2021-10-11 DIAGNOSIS — I483 Typical atrial flutter: Secondary | ICD-10-CM | POA: Insufficient documentation

## 2021-10-11 DIAGNOSIS — Z6841 Body Mass Index (BMI) 40.0 and over, adult: Secondary | ICD-10-CM | POA: Diagnosis not present

## 2021-10-11 DIAGNOSIS — I5022 Chronic systolic (congestive) heart failure: Secondary | ICD-10-CM | POA: Diagnosis not present

## 2021-10-11 DIAGNOSIS — R000 Tachycardia, unspecified: Secondary | ICD-10-CM | POA: Insufficient documentation

## 2021-10-11 DIAGNOSIS — D508 Other iron deficiency anemias: Secondary | ICD-10-CM

## 2021-10-11 DIAGNOSIS — D509 Iron deficiency anemia, unspecified: Secondary | ICD-10-CM | POA: Diagnosis not present

## 2021-10-11 DIAGNOSIS — I110 Hypertensive heart disease with heart failure: Secondary | ICD-10-CM | POA: Diagnosis not present

## 2021-10-11 DIAGNOSIS — R001 Bradycardia, unspecified: Secondary | ICD-10-CM | POA: Diagnosis not present

## 2021-10-11 DIAGNOSIS — I428 Other cardiomyopathies: Secondary | ICD-10-CM | POA: Insufficient documentation

## 2021-10-11 DIAGNOSIS — Z7984 Long term (current) use of oral hypoglycemic drugs: Secondary | ICD-10-CM | POA: Insufficient documentation

## 2021-10-11 DIAGNOSIS — Z7901 Long term (current) use of anticoagulants: Secondary | ICD-10-CM | POA: Insufficient documentation

## 2021-10-11 DIAGNOSIS — I2699 Other pulmonary embolism without acute cor pulmonale: Secondary | ICD-10-CM | POA: Diagnosis not present

## 2021-10-11 DIAGNOSIS — G4733 Obstructive sleep apnea (adult) (pediatric): Secondary | ICD-10-CM

## 2021-10-11 DIAGNOSIS — E669 Obesity, unspecified: Secondary | ICD-10-CM | POA: Diagnosis not present

## 2021-10-11 DIAGNOSIS — M25562 Pain in left knee: Secondary | ICD-10-CM

## 2021-10-11 LAB — BASIC METABOLIC PANEL
Anion gap: 6 (ref 5–15)
BUN: 9 mg/dL (ref 6–20)
CO2: 25 mmol/L (ref 22–32)
Calcium: 8.9 mg/dL (ref 8.9–10.3)
Chloride: 107 mmol/L (ref 98–111)
Creatinine, Ser: 0.81 mg/dL (ref 0.61–1.24)
GFR, Estimated: >60 mL/min (ref >60)
Glucose, Bld: 86 mg/dL (ref 70–99)
Potassium: 3.7 mmol/L (ref 3.5–5.1)
Sodium: 138 mmol/L (ref 135–145)

## 2021-10-11 LAB — BRAIN NATRIURETIC PEPTIDE: B Natriuretic Peptide: 28.5 pg/mL (ref 0.0–100.0)

## 2021-10-11 NOTE — Progress Notes (Signed)
Advanced Heart Failure Clinic Note    PCP: Mackie Pai, PA-C Primary Cardiologist: Dr Harriet Masson  HF Cardiologist: Dr. Aundra Dubin  HPI: 61 y.o. w/ h/o HTN, HLD, obesity, severe knee OA, tobacco use and NICM, first diagnosed in 05/2018 when admitted for acute CHF. Echo showed LVEF 20-25%. RV was moderately reduced. LHC showed normal cors.    Had 2D echo on 02/29/20 showing EF 25-30%. RV mild-moderately reduced.    Seen at [place] (03/03/20) w/ complaints of 4 day h/o increased dyspnea w/ hypoxia and pleuritic chest pain. Noted to be in sinus tach w/ HR in the 130s. Given concern for PE, he was referred to [facility] ED where CT angio confirmed Rt middle and lower lobe PE. No evidence of RV strain. Admitted and started on IV heparin. LE Venous doppler + for Lt DVT.  Echo done (03/04/20) RV moderately enlarged w/ mild-moderately reduced systolic function. Not significantly changed from echo 02/29/20. BNP also normal at 27. He remained hemodynamically stable. Hospitalization c/b atrial flutter, s/p successful TEE with DCCV (03/07/20). He was discharged on GDMT, discharge weight 360 lbs.   Echo in 5/22 showed EF 35-40%, global hypokinesis, mild RVE with normal function, PASP 31 mmHg. Echo 6/23 showed EF 35-40% with diffuse hypokinesis, grade 2 diastolic dysfunction, mild LVH, moderate LV enlargement, mild RV enlargement, mildly decreased RV systolic function.   Follow up 6/23, NYHA III and volume stable. Coreg increased to 18.75 bid. Referred to [facility]P at [facility].  Today he returns for HF follow up. Overall feeling fine. He has dyspnea when he walks further distances on flat ground, limited by left knee pain. Denies palpitations, CP, dizziness, edema, or PND/Orthopnea. Appetite ok. No fever or chills. Weight down another 14 lbs on Wegovy. Taking all medications. Not wearing CPAP. He has 7 children and 13 grandchildren. His father recently transitioned to hospice so he has deferred PREP program for now. Has occasional  BRBPR, referred to GI last month at Hematology visit.  ECG (personally reviewed): sinus bradycardia, 53 bpm  Labs (3/22): K 4, creatinine 1.3 Labs (8/22): K 3.7, creatinine 1.13, hgb 9.9, transferrin saturation 8% Labs (11/22): K 3.1, creatinine 1.04, hgb 11.1 Labs (2/23): LDL 115 Labs (3/23): K 3.9, creatinine 1.28, digoxin 0.3 Labs (8/23): K 4.0, creatinine 0.97, hgb 11.7, transferrin saturation 20%  PMH: 1. HTN 2. Hyperlipidemia 3. Hypothyroidism 4. OA: Knees 5. Prior smoker 6. Venous thromboembolism: Left DVT with PE in 2/22.  7. Atrial flutter: 2/22, s/p TEE-guided DCCV.  8. Chronic systolic CHF: Nonischemic cardiomyopathy.  - Echo (5/20): EF 20-25%.  - LHC/RHC (5/20): No significant CAD. Mean RA 6, PA 41/13, mean PCWP 20, CI 3.21.  - TEE (2/22): EF 20-25%, moderate LV dilation, mild RV enlargement with moderately decreased systolic function.  - Cardiac MRI (2/22): Moderate LV dilation with EF 42%, moderate RV dilation with EF 42%, nonspecific RV insertion site LGE.  - Echo (5/22): EF 35-40%, global hypokinesis, mild RVE with normal function, PASP 31 mmHg.  - Echo (6/23): EF 35-40% with diffuse hypokinesis, grade 2 diastolic dysfunction, mild LVH, moderate LV enlargement, mild RV enlargement, mildly decreased RV systolic function. 9. Obesity 10. Gout 11. Fe deficiency anemia 12. OSA  ROS: All systems negative except as listed in HPI, PMH and Problem List.  SH:  Social History   Socioeconomic History   Marital status: Married    Spouse name: Not on file   Number of children: Not on file   Years of education: Not on file  Highest education level: Not on file  Occupational History   Not on file  Tobacco Use   Smoking status: Former    Packs/day: 1.00    Years: 20.00    Total pack years: 20.00    Types: Cigarettes    Quit date: 05/23/2018    Years since quitting: 3.3   Smokeless tobacco: Never  Vaping Use   Vaping Use: Never used  Substance and Sexual Activity    Alcohol use: Yes    Alcohol/week: 0.0 standard drinks of alcohol    Comment: 6 pack a week spread out over weekend   Drug use: No   Sexual activity: Yes  Other Topics Concern   Not on file  Social History Narrative   [employer].   Married lives with wife, mother in law and daughter.  7 children.     Social Determinants of Health   Financial Resource Strain: High Risk (09/07/2020)   Overall Financial Resource Strain (CARDIA)    Difficulty of Paying Living Expenses: Hard  Food Insecurity: No Food Insecurity (09/07/2020)   Hunger Vital Sign    Worried About Running Out of Food in the Last Year: Never true    Ran Out of Food in the Last Year: Never true  Transportation Needs: No Transportation Needs (09/07/2020)   PRAPARE - Hydrologist (Medical): No    Lack of Transportation (Non-Medical): No  Physical Activity: Unknown (05/24/2018)   Exercise Vital Sign    Days of Exercise per Week: Patient refused    Minutes of Exercise per Session: Patient refused  Stress: Not on file  Social Connections: Unknown (05/24/2018)   Social Connection and Isolation Panel [NHANES]    Frequency of Communication with Friends and Family: Patient refused    Frequency of Social Gatherings with Friends and Family: Patient refused    Attends Religious Services: Patient refused    Active Member of Clubs or Organizations: Patient refused    Attends Archivist Meetings: Patient refused    Marital Status: Patient refused  Intimate Partner Violence: Unknown (05/24/2018)   Humiliation, Afraid, Rape, and Kick questionnaire    Fear of Current or Ex-Partner: Patient refused    Emotionally Abused: Patient refused    Physically Abused: Patient refused    Sexually Abused: Patient refused   FH:  Family History  Problem Relation Age of Onset   Hearing loss Mother    Cancer Father    Heart failure Other    Heart failure Paternal Grandfather    Current Outpatient Medications   Medication Sig Dispense Refill   albuterol (VENTOLIN HFA) 108 (90 Base) MCG/ACT inhaler Inhale 2 puffs into the lungs every 6 (six) hours as needed for wheezing or shortness of breath. 6.7 g 2   allopurinol (ZYLOPRIM) 100 MG tablet TAKE 1 TABLET(100 MG) BY MOUTH DAILY 90 tablet 0   apixaban (ELIQUIS) 5 MG TABS tablet Take 1 tablet (5 mg total) by mouth 2 (two) times daily. 180 tablet 2   carvedilol (COREG) 12.5 MG tablet Take 1 & 1/2 tablets (18.75 mg total) by mouth 2 (two) times daily with a meal. 180 tablet 4   dapagliflozin propanediol (FARXIGA) 10 MG TABS tablet Take 1 tablet (10 mg total) by mouth daily. 90 tablet 3   digoxin (LANOXIN) 0.125 MG tablet Take 1 tablet (0.125 mg total) by mouth daily. 90 tablet 3   levothyroxine (SYNTHROID) 25 MCG tablet Take 1 tablet (25 mcg total) by mouth daily before breakfast.  90 tablet 2   melatonin 3 MG TABS tablet Take 1 tablet (3 mg total) by mouth at bedtime. (Patient taking differently: Take 3 mg by mouth at bedtime. As needed) 90 tablet 3   potassium chloride SA (KLOR-CON M) 20 MEQ tablet Take 2 tablets (40 mEq total) by mouth daily. 60 tablet 6   sacubitril-valsartan (ENTRESTO) 97-103 MG Take 1 tablet by mouth 2 (two) times daily. 180 tablet 2   spironolactone (ALDACTONE) 25 MG tablet TAKE 1 TABLET(25 MG) BY MOUTH DAILY 30 tablet 0   torsemide (DEMADEX) 20 MG tablet TAKE 4 TABLETS(80 MG) BY MOUTH DAILY 120 tablet 6   WEGOVY 2.4 MG/0.75ML SOAJ Inject 2.4 mg into the skin once a week. 9 mL 3   No current facility-administered medications for this encounter.   BP 116/74   Pulse (!) 53   Wt (!) 146 kg (321 lb 12.8 oz)   SpO2 97%   BMI 44.88 kg/m   Wt Readings from Last 3 Encounters:  10/11/21 (!) 146 kg (321 lb 12.8 oz)  08/22/21 (!) 149.7 kg (330 lb 1.9 oz)  08/01/21 (!) 149.1 kg (328 lb 12.8 oz)   PHYSICAL EXAM: General:  NAD. No resp difficulty, walked into clinic HEENT: Normal Neck: Supple. No JVD. Carotids 2+ bilat; no bruits. No  lymphadenopathy or thryomegaly appreciated. Cor: PMI nondisplaced. Regular rate & rhythm. No rubs, gallops or murmurs. Lungs: Clear Abdomen: Obese, soft, nontender, nondistended. No hepatosplenomegaly. No bruits or masses. Good bowel sounds. Extremities: No cyanosis, clubbing, rash, edema Neuro: Alert & oriented x 3, cranial nerves grossly intact. Moves all 4 extremities w/o difficulty. Affect pleasant.  ASSESSMENT & PLAN: 1. PE possibly with infarction and LLE DVT: 2/22, unprovoked.  Patient has no FH of VTE.  He had not been sedentary, had been working full time at the [employer]. Echo in 2/22 with moderate RV dilation/moderate dysfunction. Echo in 5/22 showed mild RV enlargement, normal RV function.  - Continue Eliquis long-term.  2. Atrial flutter: Typical flutter.  This was noted at when he was admitted for PE in 2/22.  Suspect this was triggered by PE.  DCCV in 2/22. He is sinus bradycardia today, no longer taking amiodarone.  - Continue anticoagulation therapy w/ Eliquis.    - If recurrence, should have atrial flutter ablation.   3. Chronic systolic CHF: Diagnosed in 2020.  Cath from 5/20 with no significant CAD.  Cause of CMP is uncertain, nonischemic cardiomyopathy. ?Viral myocarditis.  02/2020 cMRI LV/RV EF 42% with only nonspecific RV insertion site LGE.  Echo in 5/22 showed EF 35-40% with mild RVE and normal RV function.  Echo 6/23 showed EF 35-40%, mild RV dysfunction.  Not volume overloaded on exam, but has NYHA class II-early III symptoms, limited by knee OA.  - Continue Coreg 18.75 mg bid. HR 53 today, asymptomatic. - Continue spironolactone 25 mg daily. BMET/BNP today.  - Continue digoxin 0.125, check level next visit as he has taken medication today. - Continue Entresto 97/103 bid.  - Continue torsemide 80 mg daily. - Continue dapagliflozin 10 mg daily. No GU issues. - EF appears just out of range for ICD.  Narrow QRS so not candidate for CRT.  4. Obesity: Body mass index is  44.88 kg/m.  Obesity is a major contributor to his knee pain and may contribute to his cardiomyopathy.  - He is on semaglutide and has been losing weight, he is down another 14 lbs. - Previously arranged for [employer] PREP class to  increase physical activity but his father recently transitioned to hospice. He would like to defer for now.  5. OSA: Encouraged better compliance with CPAP.  6. Knee OA: Knee pain is a major limitor.  He is not a surgical candidate at this time due to morbid obesity.  - Seeing orthopedics (Dr. Marlou Sa) and working on weight loss.   7. Anemia: Iron deficiency. He is followed by hematology. Has had recent BRBPR. He has been referred to GI but has not yet received a call. Will look into this today. He needs life-long anticoagulation with Eliquis and has never had a colonoscopy. Recent CBC and iron studies stable.  Follow up in 4 months with Dr. Aundra Dubin.   Maricela Bo [facility] FNP-BC 10/11/2021

## 2021-10-11 NOTE — Addendum Note (Signed)
Encounter addended by: Shonna Chock, CMA on: 5/63/1497 9:49 AM  Actions taken: Order list changed, Diagnosis association updated

## 2021-10-11 NOTE — Patient Instructions (Addendum)
EKG done today.  Labs done today. We will contact you only if your labs are abnormal.  No medication changes were made. Please continue all current medications as prescribed.  Your physician recommends that you schedule a follow-up appointment in: 4 months with Dr. Aundra Dubin. Please contact our office in December to schedule a January appointment.   If you have any questions or concerns before your next appointment please send [place] a message through [place] or call our office at [phone].    TO LEAVE A MESSAGE FOR THE NURSE SELECT OPTION 2, PLEASE LEAVE A MESSAGE INCLUDING: YOUR NAME DATE OF BIRTH CALL BACK NUMBER REASON FOR CALL**this is important as we prioritize the call backs  YOU WILL RECEIVE A CALL BACK THE SAME DAY AS LONG AS YOU CALL BEFORE 4:00 PM   Do the following things EVERYDAY: Weigh yourself in the morning before breakfast. Write it down and keep it in a log. Take your medicines as prescribed Eat low salt foods--Limit salt (sodium) to 2000 mg per day.  Stay as active as you can everyday Limit all fluids for the day to less than 2 liters   At the [facility], you and your health needs are our priority. As part of our continuing mission to provide you with exceptional heart care, we have created designated Provider Care Teams. These Care Teams include your primary Cardiologist (physician) and Advanced Practice Providers (APPs- Physician Assistants and Nurse Practitioners) who all work together to provide you with the care you need, when you need it.   You may see any of the following providers on your designated Care Team at your next follow up: Dr Glori Bickers Dr Haynes Kerns, NP Lyda Jester, [place] Audry Riles, PharmD   Please be sure to bring in all your medications bottles to every appointment.

## 2021-10-23 ENCOUNTER — Other Ambulatory Visit (HOSPITAL_BASED_OUTPATIENT_CLINIC_OR_DEPARTMENT_OTHER): Payer: Self-pay

## 2021-10-23 ENCOUNTER — Encounter: Payer: Self-pay | Admitting: Family

## 2021-10-24 ENCOUNTER — Other Ambulatory Visit (HOSPITAL_BASED_OUTPATIENT_CLINIC_OR_DEPARTMENT_OTHER): Payer: Self-pay

## 2021-10-29 ENCOUNTER — Other Ambulatory Visit: Payer: Self-pay | Admitting: Cardiology

## 2021-10-31 MED ORDER — SPIRONOLACTONE 25 MG PO TABS: 25.0000 mg | ORAL_TABLET | Freq: Every day | ORAL | 0 refills | Status: DC

## 2021-11-10 ENCOUNTER — Other Ambulatory Visit: Payer: Self-pay | Admitting: Medical

## 2021-11-17 ENCOUNTER — Other Ambulatory Visit: Payer: Self-pay | Admitting: Cardiology

## 2021-11-17 ENCOUNTER — Other Ambulatory Visit (HOSPITAL_BASED_OUTPATIENT_CLINIC_OR_DEPARTMENT_OTHER): Payer: Self-pay

## 2021-11-17 ENCOUNTER — Other Ambulatory Visit: Payer: Self-pay | Admitting: Medical

## 2021-11-17 MED ORDER — ALLOPURINOL 100 MG PO TABS
100.0000 mg | ORAL_TABLET | Freq: Every day | ORAL | 0 refills | Status: DC
  Filled 2021-11-17: qty 30, 30d supply, fill #0

## 2021-11-17 MED ORDER — SPIRONOLACTONE 25 MG PO TABS
25.0000 mg | ORAL_TABLET | Freq: Every day | ORAL | 0 refills | Status: DC
  Filled 2021-11-17: qty 30, 30d supply, fill #0

## 2021-11-17 NOTE — Telephone Encounter (Signed)
I have called the pt since he has not been seen since 04/2020. I have scheduled him for an appt for med refill follow up for Monday 11/20/21 with his PCP. He stated understanding.

## 2021-11-20 ENCOUNTER — Encounter: Payer: Self-pay | Admitting: Medical

## 2021-11-20 ENCOUNTER — Ambulatory Visit: Payer: Federal, State, Local not specified - PPO | Admitting: Medical

## 2021-11-20 VITALS — BP 120/60 | HR 62 | Temp 98.0°F | Resp 18 | Ht 71.0 in | Wt 319.4 lb

## 2021-11-20 DIAGNOSIS — I10 Essential (primary) hypertension: Secondary | ICD-10-CM

## 2021-11-20 DIAGNOSIS — M1000 Idiopathic gout, unspecified site: Secondary | ICD-10-CM | POA: Diagnosis not present

## 2021-11-20 DIAGNOSIS — E039 Hypothyroidism, unspecified: Secondary | ICD-10-CM | POA: Diagnosis not present

## 2021-11-20 DIAGNOSIS — I5022 Chronic systolic (congestive) heart failure: Secondary | ICD-10-CM | POA: Diagnosis not present

## 2021-11-20 NOTE — Patient Instructions (Addendum)
Chf- clinically well controlled. Continue coreg, eliquis, entresto, spirinolactone and torsemide.  For dvt- continue eliquis.  For hypothyroid- continue levothyroxine 25 mcg daily.  For gout- refilled your allopurinol. Will go ahead send in next refill.  Htn- bp well controlled on recheck.  Most of labs done by cardiologist. But on review no recent thyroid studies. Will go ahead and get tsh, t4 and add on uric acid.  GI MD office to call for your screening colonoscopy. I recommend you go ahead and call [phone].  Follow up date 6 months or sooner if needed.

## 2021-11-20 NOTE — Addendum Note (Signed)
Addended by: Kelle Darting A on: 11/20/2021 03:46 PM   Modules accepted: Orders

## 2021-11-20 NOTE — Progress Notes (Signed)
Subjective:    Patient ID: Drew [name], male    DOB: May 03, 1960, 61 y.o.   MRN: [id number]  HPI   Pt in for follow up for   Last A/P below in "  "Your blood pressure is well controlled today. Continue current bp meds.   Hx of chf. Continue current meds for chf and keep appointment with Dr. Harriet Masson cardiologist next week.   For elevated sugar in past get a1c today. Continue farxiga rx'd by Dr. Harriet Masson.   For hypothyroid recheck tsh and t4 today.   For gout refilled allopurinol today.   For hx of dvt and pe continue eliquis"  Pt mild elevated today rechedk and bp well controlled.  Chf- follow up with cardiologist  Saw cardiologist 10-11-21. On Coreg, eliquis, entresto, spirinolactone and toresemide.  Hypothyroid- on levothyroine 25 mcg daily.  Gout- on allopurinol daily. No recent gout flares.  Pt has been on wegovy and farxiga prescribed by cardiologist.      Review of Systems  Constitutional:  Negative for chills, fatigue and fever.  HENT:  Negative for congestion and drooling.   Respiratory:  Negative for cough, chest tightness, shortness of breath and wheezing.   Cardiovascular:  Negative for chest pain and palpitations.  Gastrointestinal:  Negative for abdominal pain, blood in stool and diarrhea.  Genitourinary:  Negative for enuresis, frequency and hematuria.  Musculoskeletal:  Negative for back pain.  Skin:  Negative for rash.  Neurological:  Negative for dizziness, seizures, speech difficulty, weakness and headaches.  Hematological:  Negative for adenopathy. Does not bruise/bleed easily.  Psychiatric/Behavioral:  Negative for behavioral problems, decreased concentration and dysphoric mood.     Past Medical History:  Diagnosis Date   Acute CHF (congestive heart failure) ([place]) 05/24/2018   Acute on chronic combined systolic and diastolic CHF (congestive heart failure) ([facility])    Acute systolic HF (heart failure) ([place]) 06/02/2018   AKI (acute kidney injury) ([place])  03/03/2020   Alcohol use 05/24/2018   Allergy    Chronic systolic CHF (congestive heart failure) ([place]) 03/03/2020   Dilated cardiomyopathy ([place])    Essential hypertension 05/24/2018   HFrEF (heart failure with reduced ejection fraction) ([place]) 11/20/2019   HTN (hypertension)    Hyperlipidemia 05/24/2018   Hypokalemia 03/03/2020   IDA (iron deficiency anemia) 08/23/2020   Knee pain, left 02/11/2014   New onset of congestive heart failure ([place]) 05/23/2018   Nonischemic cardiomyopathy ([place]) 11/20/2019   Obesity, Class III, BMI 40-49.9 (morbid obesity) ([place]) 05/24/2018   Pulmonary embolism ([place]) 03/03/2020   Seasonal allergies 02/11/2014   Tobacco abuse 05/24/2018   Typical atrial flutter ([place])    Wellness examination 02/26/2014     Social History   Socioeconomic History   Marital status: Married    Spouse name: Not on file   Number of children: Not on file   Years of education: Not on file   Highest education level: Not on file  Occupational History   Not on file  Tobacco Use   Smoking status: Former    Packs/day: 1.00    Years: 20.00    Total pack years: 20.00    Types: Cigarettes    Quit date: 05/23/2018    Years since quitting: 3.4   Smokeless tobacco: Never  Vaping Use   Vaping Use: Never used  Substance and Sexual Activity   Alcohol use: Yes    Alcohol/week: 0.0 standard drinks of alcohol    Comment: 6 pack a week spread out over weekend  Drug use: No   Sexual activity: Yes  Other Topics Concern   Not on file  Social History Narrative   [employer].   Married lives with wife, mother in law and daughter.  7 children.     Social Determinants of Health   Financial Resource Strain: High Risk (09/07/2020)   Overall Financial Resource Strain (CARDIA)    Difficulty of Paying Living Expenses: Hard  Food Insecurity: No Food Insecurity (09/07/2020)   Hunger Vital Sign    Worried About Running Out of Food in the Last Year: Never true    Ran Out of Food in the Last Year: Never true  Transportation  Needs: No Transportation Needs (09/07/2020)   PRAPARE - Hydrologist (Medical): No    Lack of Transportation (Non-Medical): No  Physical Activity: Unknown (05/24/2018)   Exercise Vital Sign    Days of Exercise per Week: Patient refused    Minutes of Exercise per Session: Patient refused  Stress: Not on file  Social Connections: Unknown (05/24/2018)   Social Connection and Isolation Panel [NHANES]    Frequency of Communication with Friends and Family: Patient refused    Frequency of Social Gatherings with Friends and Family: Patient refused    Attends Religious Services: Patient refused    Active Member of Clubs or Organizations: Patient refused    Attends Archivist Meetings: Patient refused    Marital Status: Patient refused  Intimate Partner Violence: Unknown (05/24/2018)   Humiliation, Afraid, Rape, and Kick questionnaire    Fear of Current or Ex-Partner: Patient refused    Emotionally Abused: Patient refused    Physically Abused: Patient refused    Sexually Abused: Patient refused    Past Surgical History:  Procedure Laterality Date   CARDIOVERSION N/A 03/07/2020   Procedure: CARDIOVERSION;  Surgeon: Larey Dresser, MD;  Location: [facility];  Service: Cardiovascular;  Laterality: N/A;   KNEE ARTHROSCOPY  2009   RIGHT/LEFT HEART CATH AND CORONARY ANGIOGRAPHY N/A 05/26/2018   Procedure: RIGHT/LEFT HEART CATH AND CORONARY ANGIOGRAPHY;  Surgeon: Belva Crome, MD;  Location: [facility];  Service: Cardiovascular;  Laterality: N/A;   TEE WITHOUT CARDIOVERSION N/A 03/07/2020   Procedure: TRANSESOPHAGEAL ECHOCARDIOGRAM (TEE);  Surgeon: Larey Dresser, MD;  Location: [facility] ENDOSCOPY;  Service: Cardiovascular;  Laterality: N/A;   THROAT SURGERY  2005    Family History  Problem Relation Age of Onset   Hearing loss Mother    Cancer Father    Heart failure Other    Heart failure Paternal Grandfather     Allergies  Allergen Reactions    Tetracyclines & Related Hives and Itching    Current Outpatient Medications on File Prior to Visit  Medication Sig Dispense Refill   albuterol (VENTOLIN HFA) 108 (90 Base) MCG/ACT inhaler Inhale 2 puffs into the lungs every 6 (six) hours as needed for wheezing or shortness of breath. 6.7 g 2   allopurinol (ZYLOPRIM) 100 MG tablet Take 1 tablet (100 mg total) by mouth daily. 30 tablet 0   apixaban (ELIQUIS) 5 MG TABS tablet Take 1 tablet (5 mg total) by mouth 2 (two) times daily. 180 tablet 2   carvedilol (COREG) 12.5 MG tablet Take 1 & 1/2 tablets (18.75 mg total) by mouth 2 (two) times daily with a meal. 180 tablet 4   dapagliflozin propanediol (FARXIGA) 10 MG TABS tablet Take 1 tablet (10 mg total) by mouth daily. 90 tablet 3   digoxin (LANOXIN) 0.125 MG  tablet Take 1 tablet (0.125 mg total) by mouth daily. 90 tablet 3   levothyroxine (SYNTHROID) 25 MCG tablet TAKE 1 TABLET BY MOUTH EVERY DAY BEFORE BREAKFAST 90 tablet 2   melatonin 3 MG TABS tablet Take 1 tablet (3 mg total) by mouth at bedtime. (Patient taking differently: Take 3 mg by mouth at bedtime. As needed) 90 tablet 3   potassium chloride SA (KLOR-CON M) 20 MEQ tablet Take 2 tablets (40 mEq total) by mouth daily. 60 tablet 6   sacubitril-valsartan (ENTRESTO) 97-103 MG Take 1 tablet by mouth 2 (two) times daily. 180 tablet 2   spironolactone (ALDACTONE) 25 MG tablet Take 1 tablet (25 mg total) by mouth daily. 30 tablet 0   torsemide (DEMADEX) 20 MG tablet TAKE 4 TABLETS(80 MG) BY MOUTH DAILY 120 tablet 6   WEGOVY 2.4 MG/0.75ML SOAJ Inject 2.4 mg into the skin once a week. 9 mL 3   No current facility-administered medications on file prior to visit.    BP 120/60   Pulse 62   Temp 98 F (36.7 C)   Resp 18   Ht 5\' 11"  (1.803 m)   Wt (!) 319 lb 6.4 oz (144.9 kg)   SpO2 95%   BMI 44.55 kg/m        Objective:   Physical Exam  General- No acute distress. Pleasant patient. Neck- Full range of motion, no jvd Lungs- Clear,  even and unlabored. Heart- regular rate and rhythm. Neurologic- CNII- XII grossly intact.   Lower ext- no pedal edema. Calfs symmetric. No swollen.      Assessment & Plan:   Patient Instructions  Chf- clinically well controlled. Continue coreg, eliquis, entresto, spirinolactone and torsemide.  For dvt- continue eliquis.  For hypothyroid- continue levothyroxine 25 mcg daily.  For gout- refilled your allopurinol. Will go ahead send in next refill.  Htn- bp well controlled on recheck.  Most of labs done by cardiologist. But on review no recent thyroid studies. Will go ahead and get tsh, t4 and add on uric acid.     Follow up 6 months or sooner if needed/  Mackie Pai, PA-C

## 2021-11-21 LAB — URIC ACID: Uric Acid, Serum: 4.9 mg/dL (ref 4.0–7.8)

## 2021-11-21 LAB — TSH: TSH: 4.42 u[IU]/mL (ref 0.35–5.50)

## 2021-11-21 LAB — T4, FREE: Free T4: 0.89 ng/dL (ref 0.60–1.60)

## 2021-12-04 ENCOUNTER — Encounter: Payer: Self-pay | Admitting: Emergency Medicine

## 2021-12-15 ENCOUNTER — Other Ambulatory Visit (HOSPITAL_BASED_OUTPATIENT_CLINIC_OR_DEPARTMENT_OTHER): Payer: Self-pay

## 2021-12-18 ENCOUNTER — Other Ambulatory Visit: Payer: Self-pay | Admitting: Cardiology

## 2021-12-27 ENCOUNTER — Other Ambulatory Visit: Payer: Self-pay | Admitting: Cardiology

## 2021-12-27 ENCOUNTER — Other Ambulatory Visit: Payer: Self-pay | Admitting: Medical

## 2021-12-27 ENCOUNTER — Other Ambulatory Visit (HOSPITAL_BASED_OUTPATIENT_CLINIC_OR_DEPARTMENT_OTHER): Payer: Self-pay

## 2021-12-27 MED ORDER — ALLOPURINOL 100 MG PO TABS
100.0000 mg | ORAL_TABLET | Freq: Every day | ORAL | 0 refills | Status: DC
Start: 2021-12-27 — End: 2022-03-05
  Filled 2021-12-27: qty 30, 30d supply, fill #0

## 2021-12-28 ENCOUNTER — Other Ambulatory Visit: Payer: Self-pay

## 2021-12-28 ENCOUNTER — Other Ambulatory Visit (HOSPITAL_BASED_OUTPATIENT_CLINIC_OR_DEPARTMENT_OTHER): Payer: Self-pay

## 2022-01-11 ENCOUNTER — Other Ambulatory Visit (HOSPITAL_BASED_OUTPATIENT_CLINIC_OR_DEPARTMENT_OTHER): Payer: Self-pay

## 2022-01-11 ENCOUNTER — Other Ambulatory Visit: Payer: Self-pay | Admitting: Cardiology

## 2022-01-11 ENCOUNTER — Other Ambulatory Visit: Payer: Self-pay

## 2022-01-11 MED ORDER — SPIRONOLACTONE 25 MG PO TABS
25.0000 mg | ORAL_TABLET | Freq: Every day | ORAL | 0 refills | Status: DC
  Filled 2022-01-11: qty 90, 90d supply, fill #0

## 2022-02-23 ENCOUNTER — Encounter: Payer: Self-pay | Admitting: Family

## 2022-02-23 ENCOUNTER — Other Ambulatory Visit: Payer: Self-pay

## 2022-02-23 ENCOUNTER — Inpatient Hospital Stay: Payer: Federal, State, Local not specified - PPO | Admitting: Family

## 2022-02-23 ENCOUNTER — Inpatient Hospital Stay: Payer: Federal, State, Local not specified - PPO | Attending: Hematology & Oncology

## 2022-02-23 VITALS — BP 129/76 | HR 60 | Temp 98.5°F | Resp 19 | Ht 71.0 in | Wt 315.1 lb

## 2022-02-23 DIAGNOSIS — D509 Iron deficiency anemia, unspecified: Secondary | ICD-10-CM

## 2022-02-23 DIAGNOSIS — I824Y2 Acute embolism and thrombosis of unspecified deep veins of left proximal lower extremity: Secondary | ICD-10-CM

## 2022-02-23 DIAGNOSIS — Z86718 Personal history of other venous thrombosis and embolism: Secondary | ICD-10-CM | POA: Diagnosis not present

## 2022-02-23 DIAGNOSIS — I2699 Other pulmonary embolism without acute cor pulmonale: Secondary | ICD-10-CM

## 2022-02-23 DIAGNOSIS — Z86711 Personal history of pulmonary embolism: Secondary | ICD-10-CM | POA: Insufficient documentation

## 2022-02-23 DIAGNOSIS — Z7901 Long term (current) use of anticoagulants: Secondary | ICD-10-CM | POA: Insufficient documentation

## 2022-02-23 LAB — CMP (CANCER CENTER ONLY)
ALT: 9 U/L (ref 0–44)
AST: 11 U/L — ABNORMAL LOW (ref 15–41)
Albumin: 4.3 g/dL (ref 3.5–5.0)
Alkaline Phosphatase: 37 U/L — ABNORMAL LOW (ref 38–126)
Anion gap: 11 (ref 5–15)
BUN: 12 mg/dL (ref 6–20)
CO2: 23 mmol/L (ref 22–32)
Calcium: 8.9 mg/dL (ref 8.9–10.3)
Chloride: 106 mmol/L (ref 98–111)
Creatinine: 0.96 mg/dL (ref 0.61–1.24)
GFR, Estimated: >60 mL/min (ref >60)
Glucose, Bld: 89 mg/dL (ref 70–99)
Potassium: 4.2 mmol/L (ref 3.5–5.1)
Sodium: 140 mmol/L (ref 135–145)
Total Bilirubin: 0.5 mg/dL (ref 0.3–1.2)
Total Protein: 7.5 g/dL (ref 6.5–8.1)

## 2022-02-23 LAB — CBC WITH DIFFERENTIAL (CANCER CENTER ONLY)
Abs Immature Granulocytes: 0.01 10*3/uL (ref 0.00–0.07)
Basophils Absolute: 0.1 10*3/uL (ref 0.0–0.1)
Basophils Relative: 1 %
Eosinophils Absolute: 0.4 10*3/uL (ref 0.0–0.5)
Eosinophils Relative: 7 %
HCT: 31.8 % — ABNORMAL LOW (ref 39.0–52.0)
Hemoglobin: 9.8 g/dL — ABNORMAL LOW (ref 13.0–17.0)
Immature Granulocytes: 0 %
Lymphocytes Relative: 29 %
Lymphs Abs: 1.5 10*3/uL (ref 0.7–4.0)
MCH: 26.3 pg (ref 26.0–34.0)
MCHC: 30.8 g/dL (ref 30.0–36.0)
MCV: 85.5 fL (ref 80.0–100.0)
Monocytes Absolute: 0.5 10*3/uL (ref 0.1–1.0)
Monocytes Relative: 10 %
Neutro Abs: 2.7 10*3/uL (ref 1.7–7.7)
Neutrophils Relative %: 53 %
Platelet Count: 292 10*3/uL (ref 150–400)
RBC: 3.72 MIL/uL — ABNORMAL LOW (ref 4.22–5.81)
RDW: 17.2 % — ABNORMAL HIGH (ref 11.5–15.5)
WBC Count: 5.2 10*3/uL (ref 4.0–10.5)
nRBC: 0 % (ref 0.0–0.2)

## 2022-02-23 LAB — IRON AND IRON BINDING CAPACITY (CC-WL,HP ONLY)
Iron: 21 ug/dL — ABNORMAL LOW (ref 45–182)
Saturation Ratios: 5 % — ABNORMAL LOW (ref 17.9–39.5)
TIBC: 449 ug/dL (ref 250–450)
UIBC: 428 ug/dL — ABNORMAL HIGH (ref 117–376)

## 2022-02-23 LAB — FERRITIN: Ferritin: 8 ng/mL — ABNORMAL LOW (ref 24–336)

## 2022-02-23 NOTE — Progress Notes (Signed)
Hematology and Oncology Follow Up Visit  Drew [name] [id number] Jan 24, 1960 62 y.o. 02/23/2022   Principle Diagnosis:  Right lower and middle lobe PEs with no evidence of right heart strain  Left lower extremity DVT involving the left peroneal veins  Iron deficiency anemia    Current Therapy:        Eliquis 5 mg PO BID - lifelong IV iron as indicated   Interim History:  Drew [name] is here today for follow-up. He is doing quite well and has no complaints at this time.  He is doing well on Eliquis and taking as prescribed.  He states that he occasionally has blood in his stool . No other blood loss noted.  No abnormal bruising, no petechiae.  No fever, chills, n/v, cough, rash, dizziness, chest pain, palpitations, abdominal pain or changes in bowel or bladder habits.  No swelling, tenderness, numbness or tingling in his extremities at this time.  No falls or syncope.  Appetite and hydration are good. Patient states he is doing well on Wegovy Weight is stable at 315 lbs.   ECOG Performance Status: 1 - Symptomatic but completely ambulatory  Medications:  Allergies as of 02/23/2022       Reactions   Tetracyclines & Related Hives, Itching        Medication List        Accurate as of February 23, 2022 10:11 AM. If you have any questions, ask your nurse or doctor.          albuterol 108 (90 Base) MCG/ACT inhaler Commonly known as: VENTOLIN HFA Inhale 2 puffs into the lungs every 6 (six) hours as needed for wheezing or shortness of breath.   allopurinol 100 MG tablet Commonly known as: ZYLOPRIM Take 1 tablet (100 mg total) by mouth daily.   carvedilol 12.5 MG tablet Commonly known as: COREG Take 1 & 1/2 tablets (18.75 mg total) by mouth 2 (two) times daily with a meal.   digoxin 0.125 MG tablet Commonly known as: LANOXIN Take 1 tablet (0.125 mg total) by mouth daily.   Eliquis 5 MG Tabs tablet Generic drug: apixaban Take 1 tablet (5 mg total) by mouth 2 (two)  times daily.   Entresto 97-103 MG Generic drug: sacubitril-valsartan Take 1 tablet by mouth 2 (two) times daily.   Farxiga 10 MG Tabs tablet Generic drug: dapagliflozin propanediol Take 1 tablet (10 mg total) by mouth daily.   levothyroxine 25 MCG tablet Commonly known as: SYNTHROID TAKE 1 TABLET BY MOUTH EVERY DAY BEFORE BREAKFAST   melatonin 3 MG Tabs tablet Take 1 tablet (3 mg total) by mouth at bedtime. What changed: additional instructions   potassium chloride SA 20 MEQ tablet Commonly known as: KLOR-CON M Take 2 tablets (40 mEq total) by mouth daily.   spironolactone 25 MG tablet Commonly known as: ALDACTONE Take 1 tablet (25 mg total) by mouth daily.   torsemide 20 MG tablet Commonly known as: DEMADEX TAKE 4 TABLETS(80 MG) BY MOUTH DAILY   Wegovy 2.4 MG/0.75ML Soaj Generic drug: Semaglutide-Weight Management Inject 2.4 mg into the skin once a week.        Allergies:  Allergies  Allergen Reactions   Tetracyclines & Related Hives and Itching    Past Medical History, Surgical history, Social history, and Family History were reviewed and updated.  Review of Systems: All other 10 point review of systems is negative.   Physical Exam:  vitals were not taken for this visit.   Wt Readings from  Last 3 Encounters:  11/20/21 (!) 319 lb 6.4 oz (144.9 kg)  10/11/21 (!) 321 lb 12.8 oz (146 kg)  08/22/21 (!) 330 lb 1.9 oz (149.7 kg)    Ocular: Sclerae unicteric, pupils equal, round and reactive to light Ear-nose-throat: Oropharynx clear, dentition fair Lymphatic: No cervical or supraclavicular adenopathy Lungs no rales or rhonchi, good excursion bilaterally Heart regular rate and rhythm, no murmur appreciated Abd soft, nontender, positive bowel sounds MSK no focal spinal tenderness, no joint edema Neuro: non-focal, well-oriented, appropriate affect Breasts: Deferred   Lab Results  Component Value Date   WBC 6.5 08/22/2021   HGB 11.7 (L) 08/22/2021   HCT  34.6 (L) 08/22/2021   MCV 95.3 08/22/2021   PLT 230 08/22/2021   Lab Results  Component Value Date   FERRITIN 41 08/22/2021   IRON 69 08/22/2021   TIBC 343 08/22/2021   UIBC 274 08/22/2021   IRONPCTSAT 20 08/22/2021   Lab Results  Component Value Date   RETICCTPCT 1.9 11/23/2020   RBC 3.63 (L) 08/22/2021   No results found for: "KPAFRELGTCHN", "LAMBDASER", "KAPLAMBRATIO" No results found for: "IGGSERUM", "IGA", "IGMSERUM" No results found for: "TOTALPROTELP", "ALBUMINELP", "A1GS", "A2GS", "BETS", "BETA2SER", "GAMS", "MSPIKE", "SPEI"   Chemistry      Component Value Date/Time   NA 138 10/11/2021 0907   NA 146 (H) 02/05/2020 1114   K 3.7 10/11/2021 0907   CL 107 10/11/2021 0907   CO2 25 10/11/2021 0907   BUN 9 10/11/2021 0907   BUN 16 02/05/2020 1114   CREATININE 0.81 10/11/2021 0907   CREATININE 0.97 08/22/2021 1036   CREATININE 1.26 11/13/2019 1454      Component Value Date/Time   CALCIUM 8.9 10/11/2021 0907   ALKPHOS 38 08/22/2021 1036   AST 10 (L) 08/22/2021 1036   ALT 10 08/22/2021 1036   BILITOT 0.4 08/22/2021 1036       Impression and Plan: Drew [name] is a very pleasant 62 yo African American gentleman with history of right lower and middle lobe PEs with no evidence of right heart strain and left lower extremity DVT involving the left peroneal veins with atrial flutter (TEE and cardioversion on 03/07/20). DVT and PE have resolved on follow-up scans in 2022.  GI referral placed.  Continue to regimen with Eliquis, lifelong.  Iron studies are pending.  Follow-up in 6 months.   Drew Dawson, NP 2/9/202410:11 AM

## 2022-02-26 ENCOUNTER — Inpatient Hospital Stay: Payer: Federal, State, Local not specified - PPO

## 2022-02-26 VITALS — BP 125/67 | HR 61 | Temp 98.3°F | Resp 18 | Wt 315.0 lb

## 2022-02-26 DIAGNOSIS — Z86711 Personal history of pulmonary embolism: Secondary | ICD-10-CM | POA: Diagnosis not present

## 2022-02-26 DIAGNOSIS — Z86718 Personal history of other venous thrombosis and embolism: Secondary | ICD-10-CM | POA: Diagnosis not present

## 2022-02-26 DIAGNOSIS — Z7901 Long term (current) use of anticoagulants: Secondary | ICD-10-CM | POA: Diagnosis not present

## 2022-02-26 DIAGNOSIS — D509 Iron deficiency anemia, unspecified: Secondary | ICD-10-CM

## 2022-02-26 MED ORDER — SODIUM CHLORIDE 0.9 % IV SOLN
510.0000 mg | Freq: Once | INTRAVENOUS | Status: AC
  Administered 2022-02-26: 510 mg via INTRAVENOUS
  Filled 2022-02-26: qty 17

## 2022-02-26 MED ORDER — SODIUM CHLORIDE 0.9 % IV SOLN: Freq: Once | INTRAVENOUS | Status: AC

## 2022-02-26 NOTE — Progress Notes (Signed)
Pat unable to stay full 30 minutes after treatment. Vss pt discharged.

## 2022-02-26 NOTE — Patient Instructions (Signed)

## 2022-03-05 ENCOUNTER — Other Ambulatory Visit (HOSPITAL_COMMUNITY): Payer: Self-pay

## 2022-03-05 ENCOUNTER — Other Ambulatory Visit: Payer: Self-pay | Admitting: Medical

## 2022-03-05 ENCOUNTER — Inpatient Hospital Stay: Payer: Federal, State, Local not specified - PPO

## 2022-03-05 ENCOUNTER — Other Ambulatory Visit (HOSPITAL_BASED_OUTPATIENT_CLINIC_OR_DEPARTMENT_OTHER): Payer: Self-pay

## 2022-03-05 ENCOUNTER — Other Ambulatory Visit: Payer: Self-pay

## 2022-03-05 VITALS — BP 129/70 | HR 60 | Temp 98.4°F | Resp 17

## 2022-03-05 DIAGNOSIS — Z86711 Personal history of pulmonary embolism: Secondary | ICD-10-CM | POA: Diagnosis not present

## 2022-03-05 DIAGNOSIS — D509 Iron deficiency anemia, unspecified: Secondary | ICD-10-CM | POA: Diagnosis not present

## 2022-03-05 DIAGNOSIS — Z7901 Long term (current) use of anticoagulants: Secondary | ICD-10-CM | POA: Diagnosis not present

## 2022-03-05 DIAGNOSIS — Z86718 Personal history of other venous thrombosis and embolism: Secondary | ICD-10-CM | POA: Diagnosis not present

## 2022-03-05 MED ORDER — ALLOPURINOL 100 MG PO TABS
100.0000 mg | ORAL_TABLET | Freq: Every day | ORAL | 0 refills | Status: DC
  Filled 2022-03-05 (×2): qty 30, 30d supply, fill #0

## 2022-03-05 MED ORDER — SODIUM CHLORIDE 0.9 % IV SOLN
510.0000 mg | Freq: Once | INTRAVENOUS | Status: AC
  Administered 2022-03-05: 510 mg via INTRAVENOUS
  Filled 2022-03-05: qty 17

## 2022-03-05 MED ORDER — SODIUM CHLORIDE 0.9 % IV SOLN: Freq: Once | INTRAVENOUS | Status: AC

## 2022-03-05 NOTE — Patient Instructions (Signed)

## 2022-03-09 ENCOUNTER — Other Ambulatory Visit (HOSPITAL_COMMUNITY): Payer: Self-pay

## 2022-04-04 ENCOUNTER — Other Ambulatory Visit: Payer: Self-pay | Admitting: Medical

## 2022-04-05 ENCOUNTER — Other Ambulatory Visit: Payer: Self-pay

## 2022-04-05 ENCOUNTER — Other Ambulatory Visit (HOSPITAL_BASED_OUTPATIENT_CLINIC_OR_DEPARTMENT_OTHER): Payer: Self-pay

## 2022-04-05 MED ORDER — ALLOPURINOL 100 MG PO TABS
100.0000 mg | ORAL_TABLET | Freq: Every day | ORAL | 0 refills | Status: DC
  Filled 2022-04-05: qty 30, 30d supply, fill #0

## 2022-04-11 ENCOUNTER — Other Ambulatory Visit (HOSPITAL_BASED_OUTPATIENT_CLINIC_OR_DEPARTMENT_OTHER): Payer: Self-pay

## 2022-05-03 ENCOUNTER — Other Ambulatory Visit: Payer: Self-pay | Admitting: Cardiology

## 2022-05-03 ENCOUNTER — Other Ambulatory Visit (HOSPITAL_BASED_OUTPATIENT_CLINIC_OR_DEPARTMENT_OTHER): Payer: Self-pay

## 2022-05-03 ENCOUNTER — Other Ambulatory Visit: Payer: Self-pay

## 2022-05-03 MED ORDER — SPIRONOLACTONE 25 MG PO TABS
25.0000 mg | ORAL_TABLET | Freq: Every day | ORAL | 0 refills | Status: DC
  Filled 2022-05-03: qty 90, 90d supply, fill #0

## 2022-05-18 ENCOUNTER — Inpatient Hospital Stay: Payer: Federal, State, Local not specified - PPO | Admitting: Hematology & Oncology

## 2022-05-18 ENCOUNTER — Ambulatory Visit: Payer: Federal, State, Local not specified - PPO | Admitting: Hematology & Oncology

## 2022-05-18 ENCOUNTER — Other Ambulatory Visit: Payer: Federal, State, Local not specified - PPO

## 2022-05-18 ENCOUNTER — Encounter: Payer: Self-pay | Admitting: Hematology & Oncology

## 2022-05-18 ENCOUNTER — Inpatient Hospital Stay: Payer: Federal, State, Local not specified - PPO | Attending: Hematology & Oncology

## 2022-05-18 VITALS — BP 125/69 | HR 59 | Temp 98.2°F | Resp 20 | Ht 71.0 in | Wt 309.8 lb

## 2022-05-18 DIAGNOSIS — I824Y2 Acute embolism and thrombosis of unspecified deep veins of left proximal lower extremity: Secondary | ICD-10-CM

## 2022-05-18 DIAGNOSIS — I2782 Chronic pulmonary embolism: Secondary | ICD-10-CM

## 2022-05-18 DIAGNOSIS — I2609 Other pulmonary embolism with acute cor pulmonale: Secondary | ICD-10-CM

## 2022-05-18 DIAGNOSIS — Z86711 Personal history of pulmonary embolism: Secondary | ICD-10-CM | POA: Insufficient documentation

## 2022-05-18 DIAGNOSIS — Z7901 Long term (current) use of anticoagulants: Secondary | ICD-10-CM | POA: Insufficient documentation

## 2022-05-18 DIAGNOSIS — D509 Iron deficiency anemia, unspecified: Secondary | ICD-10-CM | POA: Diagnosis not present

## 2022-05-18 DIAGNOSIS — Z86718 Personal history of other venous thrombosis and embolism: Secondary | ICD-10-CM | POA: Diagnosis not present

## 2022-05-18 LAB — CMP (CANCER CENTER ONLY)
ALT: 7 U/L (ref 0–44)
AST: 10 U/L — ABNORMAL LOW (ref 15–41)
Albumin: 3.9 g/dL (ref 3.5–5.0)
Alkaline Phosphatase: 42 U/L (ref 38–126)
Anion gap: 7 (ref 5–15)
BUN: 13 mg/dL (ref 6–20)
CO2: 23 mmol/L (ref 22–32)
Calcium: 8.5 mg/dL — ABNORMAL LOW (ref 8.9–10.3)
Chloride: 106 mmol/L (ref 98–111)
Creatinine: 0.94 mg/dL (ref 0.61–1.24)
GFR, Estimated: >60 mL/min (ref >60)
Glucose, Bld: 84 mg/dL (ref 70–99)
Potassium: 3.7 mmol/L (ref 3.5–5.1)
Sodium: 136 mmol/L (ref 135–145)
Total Bilirubin: 0.3 mg/dL (ref 0.3–1.2)
Total Protein: 6.9 g/dL (ref 6.5–8.1)

## 2022-05-18 LAB — CBC WITH DIFFERENTIAL (CANCER CENTER ONLY)
Abs Immature Granulocytes: 0.02 10*3/uL (ref 0.00–0.07)
Basophils Absolute: 0.1 10*3/uL (ref 0.0–0.1)
Basophils Relative: 1 %
Eosinophils Absolute: 0.4 10*3/uL (ref 0.0–0.5)
Eosinophils Relative: 7 %
HCT: 34.8 % — ABNORMAL LOW (ref 39.0–52.0)
Hemoglobin: 11.8 g/dL — ABNORMAL LOW (ref 13.0–17.0)
Immature Granulocytes: 0 %
Lymphocytes Relative: 27 %
Lymphs Abs: 1.6 10*3/uL (ref 0.7–4.0)
MCH: 31.4 pg (ref 26.0–34.0)
MCHC: 33.9 g/dL (ref 30.0–36.0)
MCV: 92.6 fL (ref 80.0–100.0)
Monocytes Absolute: 0.5 10*3/uL (ref 0.1–1.0)
Monocytes Relative: 8 %
Neutro Abs: 3.2 10*3/uL (ref 1.7–7.7)
Neutrophils Relative %: 57 %
Platelet Count: 232 10*3/uL (ref 150–400)
RBC: 3.76 MIL/uL — ABNORMAL LOW (ref 4.22–5.81)
RDW: 18.5 % — ABNORMAL HIGH (ref 11.5–15.5)
WBC Count: 5.8 10*3/uL (ref 4.0–10.5)
nRBC: 0 % (ref 0.0–0.2)

## 2022-05-18 LAB — IRON AND IRON BINDING CAPACITY (CC-WL,HP ONLY)
Iron: 61 ug/dL (ref 45–182)
Saturation Ratios: 18 % (ref 17.9–39.5)
TIBC: 333 ug/dL (ref 250–450)
UIBC: 272 ug/dL (ref 117–376)

## 2022-05-18 LAB — FERRITIN: Ferritin: 22 ng/mL — ABNORMAL LOW (ref 24–336)

## 2022-05-18 NOTE — Progress Notes (Signed)
Hematology and Oncology Follow Up Visit  Drew [name] [id number] 1960-08-03 62 y.o. 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  This is the first time that I am seeing him.  He is originally from [place].  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  He has blood clot back in 2022.  He is on anticoagulation.  He is on Eliquis for life.  He has never had a colonoscopy.  He clearly needs to have a colonoscopy.  He has had iron deficiency in the past.  He has responded well to IV iron.  He is feeling okay.  He has had no cough or shortness of breath.  He has had no nausea or vomiting.  There is been no change in bowel or bladder habits.  He has had no rashes.  There is been no obvious bleeding.  Thankfully, I do not think he has had problems with COVID.  Overall, I was his performance status is probably ECOG 0.     Medications:  Allergies as of 05/18/2022       Reactions   Tetracyclines & Related Hives, Itching        Medication List        Accurate as of May 18, 2022  9:52 AM. If you have any questions, ask your nurse or doctor.          albuterol 108 (90 Base) MCG/ACT inhaler Commonly known as: VENTOLIN HFA Inhale 2 puffs into the lungs every 6 (six) hours as needed for wheezing or shortness of breath.   allopurinol 100 MG tablet Commonly known as: ZYLOPRIM Take 1 tablet (100 mg total) by mouth daily.   carvedilol 12.5 MG tablet Commonly known as: COREG Take 1 & 1/2 tablets (18.75 mg total) by mouth 2 (two) times daily with a meal.   digoxin 0.125 MG tablet Commonly known as: LANOXIN Take 1 tablet (0.125 mg  total) by mouth daily.   Eliquis 5 MG Tabs tablet Generic drug: apixaban Take 1 tablet (5 mg total) by mouth 2 (two) times daily.   Entresto 97-103 MG Generic drug: sacubitril-valsartan Take 1 tablet by mouth 2 (two) times daily.   Farxiga 10 MG Tabs tablet Generic drug: dapagliflozin propanediol Take 1 tablet (10 mg total) by mouth daily.   levothyroxine 25 MCG tablet Commonly known as: SYNTHROID TAKE 1 TABLET BY MOUTH EVERY DAY BEFORE BREAKFAST   melatonin 3 MG Tabs tablet Take 1 tablet (3 mg total) by mouth at bedtime. What changed: additional instructions   potassium chloride SA 20 MEQ tablet Commonly known as: KLOR-CON M Take 2 tablets (40 mEq total) by mouth daily.   spironolactone 25 MG tablet Commonly known as: ALDACTONE Take 1 tablet (25 mg total) by mouth daily.   torsemide 20 MG tablet Commonly known as: DEMADEX TAKE 4 TABLETS(80 MG) BY MOUTH DAILY   Wegovy 2.4 MG/0.75ML Soaj Generic drug: Semaglutide-Weight Management Inject 2.4 mg into the skin once a week.        Allergies:  Allergies  Allergen Reactions   Tetracyclines &  Related Hives and Itching    Past Medical History, Surgical history, Social history, and Family History were reviewed and updated.  Review of Systems: All other 10 point review of systems is negative.   Physical Exam:  height is 5\' 11"  (1.803 m) and weight is 309 lb 12.8 oz (140.5 kg) (abnormal). His oral temperature is 98.2 F (36.8 C). His blood pressure is 125/69 and his pulse is 59 (abnormal). His respiration is 20 and oxygen saturation is 99%.   Wt Readings from Last 3 Encounters:  05/18/22 (!) 309 lb 12.8 oz (140.5 kg)  02/26/22 (!) 315 lb (142.9 kg)  02/23/22 (!) 315 lb 1.9 oz (142.9 kg)    Ocular: Sclerae unicteric, pupils equal, round and reactive to light Ear-nose-throat: Oropharynx clear, dentition fair Lymphatic: No cervical or supraclavicular adenopathy Lungs no rales or rhonchi, good excursion  bilaterally Heart regular rate and rhythm, no murmur appreciated Abd soft, nontender, positive bowel sounds MSK no focal spinal tenderness, no joint edema Neuro: non-focal, well-oriented, appropriate affect Breasts: Deferred   Lab Results  Component Value Date   WBC 5.8 05/18/2022   HGB 11.8 (L) 05/18/2022   HCT 34.8 (L) 05/18/2022   MCV 92.6 05/18/2022   PLT 232 05/18/2022   Lab Results  Component Value Date   FERRITIN 8 (L) 02/23/2022   IRON 21 (L) 02/23/2022   TIBC 449 02/23/2022   UIBC 428 (H) 02/23/2022   IRONPCTSAT 5 (L) 02/23/2022   Lab Results  Component Value Date   RETICCTPCT 1.9 11/23/2020   RBC 3.76 (L) 05/18/2022   No results found for: "KPAFRELGTCHN", "LAMBDASER", "KAPLAMBRATIO" No results found for: "IGGSERUM", "IGA", "IGMSERUM" No results found for: "TOTALPROTELP", "ALBUMINELP", "A1GS", "A2GS", "BETS", "BETA2SER", "GAMS", "MSPIKE", "SPEI"   Chemistry      Component Value Date/Time   NA 136 05/18/2022 0854   NA 146 (H) 02/05/2020 1114   K 3.7 05/18/2022 0854   CL 106 05/18/2022 0854   CO2 23 05/18/2022 0854   BUN 13 05/18/2022 0854   BUN 16 02/05/2020 1114   CREATININE 0.94 05/18/2022 0854   CREATININE 1.26 11/13/2019 1454      Component Value Date/Time   CALCIUM 8.5 (L) 05/18/2022 0854   ALKPHOS 42 05/18/2022 0854   AST 10 (L) 05/18/2022 0854   ALT 7 05/18/2022 0854   BILITOT 0.3 05/18/2022 0854       Impression and Plan: Drew [name] is a very pleasant 62 yo African American gentleman with history of right lower and middle lobe PEs with no evidence of right heart strain and left lower extremity DVT involving the left peroneal veins with atrial flutter (TEE and cardioversion on 03/07/20). DVT and PE have resolved on follow-up scans in 2022.   Everything looks fine from my point of view.  Again, he needs to have Gastroenterology see him for a colonoscopy.  I think we will probably get him back in 6 months.  He responded well to IV iron.     Josph Macho, MD 5/3/20249:52 AM

## 2022-05-21 ENCOUNTER — Other Ambulatory Visit (HOSPITAL_BASED_OUTPATIENT_CLINIC_OR_DEPARTMENT_OTHER): Payer: Self-pay

## 2022-05-21 ENCOUNTER — Encounter (HOSPITAL_BASED_OUTPATIENT_CLINIC_OR_DEPARTMENT_OTHER): Payer: Self-pay | Admitting: Cardiology

## 2022-05-21 ENCOUNTER — Encounter: Payer: Self-pay | Admitting: Medical

## 2022-05-21 ENCOUNTER — Other Ambulatory Visit (HOSPITAL_COMMUNITY): Payer: Self-pay

## 2022-05-21 MED ORDER — TORSEMIDE 20 MG PO TABS
80.0000 mg | ORAL_TABLET | Freq: Every day | ORAL | 0 refills | Status: AC
  Filled 2022-05-21: qty 60, 15d supply, fill #0

## 2022-05-21 MED ORDER — LEVOTHYROXINE SODIUM 25 MCG PO TABS
25.0000 ug | ORAL_TABLET | Freq: Every day | ORAL | 0 refills | Status: DC
  Filled 2022-05-21: qty 30, 30d supply, fill #0

## 2022-05-21 NOTE — Addendum Note (Signed)
Addended by: Conrad Kirby D on: 05/21/2022 11:43 AM   Modules accepted: Orders

## 2022-05-31 ENCOUNTER — Other Ambulatory Visit: Payer: Self-pay | Admitting: Cardiology

## 2022-06-01 ENCOUNTER — Other Ambulatory Visit (HOSPITAL_BASED_OUTPATIENT_CLINIC_OR_DEPARTMENT_OTHER): Payer: Self-pay

## 2022-06-01 MED ORDER — WEGOVY 2.4 MG/0.75ML ~~LOC~~ SOAJ
2.4000 mg | SUBCUTANEOUS | 0 refills | Status: DC
  Filled 2022-06-01: qty 3, 28d supply, fill #0
  Filled 2022-06-27: qty 3, 28d supply, fill #1
  Filled 2022-07-29 – 2022-08-02 (×2): qty 3, 28d supply, fill #2

## 2022-06-12 ENCOUNTER — Other Ambulatory Visit: Payer: Self-pay | Admitting: Cardiology

## 2022-06-14 ENCOUNTER — Other Ambulatory Visit (HOSPITAL_BASED_OUTPATIENT_CLINIC_OR_DEPARTMENT_OTHER): Payer: Self-pay

## 2022-07-17 ENCOUNTER — Other Ambulatory Visit (HOSPITAL_BASED_OUTPATIENT_CLINIC_OR_DEPARTMENT_OTHER): Payer: Self-pay

## 2022-07-17 ENCOUNTER — Other Ambulatory Visit: Payer: Self-pay | Admitting: Medical

## 2022-07-17 ENCOUNTER — Other Ambulatory Visit (HOSPITAL_COMMUNITY): Payer: Self-pay | Admitting: Cardiology

## 2022-07-17 MED ORDER — ALLOPURINOL 100 MG PO TABS
100.0000 mg | ORAL_TABLET | Freq: Every day | ORAL | 0 refills | Status: DC
  Filled 2022-07-17: qty 30, 30d supply, fill #0

## 2022-07-17 MED ORDER — DIGOXIN 125 MCG PO TABS
0.1250 mg | ORAL_TABLET | Freq: Every day | ORAL | 0 refills | Status: DC
  Filled 2022-07-17: qty 90, 90d supply, fill #0

## 2022-07-27 ENCOUNTER — Encounter: Payer: Self-pay | Admitting: Family

## 2022-07-27 ENCOUNTER — Telehealth: Payer: Self-pay

## 2022-07-27 ENCOUNTER — Other Ambulatory Visit (HOSPITAL_COMMUNITY): Payer: Self-pay

## 2022-07-27 NOTE — Telephone Encounter (Signed)
Pharmacy Patient Advocate Encounter   Received notification from CoverMyMeds that prior authorization for [facility] is required/requested.   Insurance verification completed.   The patient is insured through [facility] .   PA submitted to [facility] via CoverMyMeds Key/confirmation #/EOC [facility] Status is pending

## 2022-07-29 ENCOUNTER — Other Ambulatory Visit: Payer: Self-pay | Admitting: Medical

## 2022-07-30 ENCOUNTER — Other Ambulatory Visit (HOSPITAL_BASED_OUTPATIENT_CLINIC_OR_DEPARTMENT_OTHER): Payer: Self-pay

## 2022-07-30 MED ORDER — LEVOTHYROXINE SODIUM 25 MCG PO TABS
25.0000 ug | ORAL_TABLET | Freq: Every day | ORAL | 0 refills | Status: DC
  Filled 2022-07-30: qty 30, 30d supply, fill #0

## 2022-07-30 NOTE — Telephone Encounter (Signed)
Pharmacy Patient Advocate Encounter  Received notification from [facility] that Prior Authorization for Marcelline Deist has been APPROVED from 06/27/22 to 07/27/23.Marland Kitchen

## 2022-07-31 ENCOUNTER — Other Ambulatory Visit (HOSPITAL_BASED_OUTPATIENT_CLINIC_OR_DEPARTMENT_OTHER): Payer: Self-pay

## 2022-08-02 ENCOUNTER — Other Ambulatory Visit (HOSPITAL_BASED_OUTPATIENT_CLINIC_OR_DEPARTMENT_OTHER): Payer: Self-pay

## 2022-08-06 ENCOUNTER — Telehealth (HOSPITAL_COMMUNITY): Payer: Self-pay

## 2022-08-06 ENCOUNTER — Other Ambulatory Visit (HOSPITAL_COMMUNITY): Payer: Self-pay

## 2022-08-06 ENCOUNTER — Other Ambulatory Visit (HOSPITAL_BASED_OUTPATIENT_CLINIC_OR_DEPARTMENT_OTHER): Payer: Self-pay

## 2022-08-06 NOTE — Telephone Encounter (Signed)
Patient Advocate Encounter  Prior authorization for Reginal Lutes has been submitted and approved. Test billing returns $24.99 for 28 day supply (with [employer] eVoucher)  Key: [id number] Effective: 07/07/2022 to 08/06/2023  Burnell Blanks, CPhT Rx Patient Advocate Phone: [phone]

## 2022-08-24 ENCOUNTER — Encounter: Payer: Self-pay | Admitting: Family

## 2022-08-24 ENCOUNTER — Other Ambulatory Visit: Payer: Self-pay

## 2022-08-24 ENCOUNTER — Inpatient Hospital Stay: Payer: Medicare Other | Attending: Hematology & Oncology

## 2022-08-24 ENCOUNTER — Inpatient Hospital Stay: Payer: Medicare Other | Admitting: Family

## 2022-08-24 VITALS — BP 107/68 | HR 62 | Temp 98.5°F | Resp 18 | Ht 71.0 in | Wt 295.1 lb

## 2022-08-24 DIAGNOSIS — D509 Iron deficiency anemia, unspecified: Secondary | ICD-10-CM

## 2022-08-24 DIAGNOSIS — I2609 Other pulmonary embolism with acute cor pulmonale: Secondary | ICD-10-CM | POA: Diagnosis not present

## 2022-08-24 DIAGNOSIS — Z86711 Personal history of pulmonary embolism: Secondary | ICD-10-CM | POA: Diagnosis present

## 2022-08-24 DIAGNOSIS — Z7901 Long term (current) use of anticoagulants: Secondary | ICD-10-CM | POA: Insufficient documentation

## 2022-08-24 DIAGNOSIS — Z86718 Personal history of other venous thrombosis and embolism: Secondary | ICD-10-CM | POA: Diagnosis present

## 2022-08-24 DIAGNOSIS — I2782 Chronic pulmonary embolism: Secondary | ICD-10-CM | POA: Diagnosis not present

## 2022-08-24 DIAGNOSIS — I824Y2 Acute embolism and thrombosis of unspecified deep veins of left proximal lower extremity: Secondary | ICD-10-CM

## 2022-08-24 LAB — CBC WITH DIFFERENTIAL (CANCER CENTER ONLY)
Abs Immature Granulocytes: 0.01 10*3/uL (ref 0.00–0.07)
Basophils Absolute: 0.1 10*3/uL (ref 0.0–0.1)
Basophils Relative: 1 %
Eosinophils Absolute: 0.3 10*3/uL (ref 0.0–0.5)
Eosinophils Relative: 6 %
HCT: 36.6 % — ABNORMAL LOW (ref 39.0–52.0)
Hemoglobin: 12.2 g/dL — ABNORMAL LOW (ref 13.0–17.0)
Immature Granulocytes: 0 %
Lymphocytes Relative: 27 %
Lymphs Abs: 1.6 10*3/uL (ref 0.7–4.0)
MCH: 32.2 pg (ref 26.0–34.0)
MCHC: 33.3 g/dL (ref 30.0–36.0)
MCV: 96.6 fL (ref 80.0–100.0)
Monocytes Absolute: 0.5 10*3/uL (ref 0.1–1.0)
Monocytes Relative: 8 %
Neutro Abs: 3.4 10*3/uL (ref 1.7–7.7)
Neutrophils Relative %: 58 %
Platelet Count: 255 10*3/uL (ref 150–400)
RBC: 3.79 MIL/uL — ABNORMAL LOW (ref 4.22–5.81)
RDW: 13.1 % (ref 11.5–15.5)
WBC Count: 5.9 10*3/uL (ref 4.0–10.5)
nRBC: 0 % (ref 0.0–0.2)

## 2022-08-24 LAB — CMP (CANCER CENTER ONLY)
ALT: 7 U/L (ref 0–44)
AST: 11 U/L — ABNORMAL LOW (ref 15–41)
Albumin: 4.2 g/dL (ref 3.5–5.0)
Alkaline Phosphatase: 43 U/L (ref 38–126)
Anion gap: 8 (ref 5–15)
BUN: 14 mg/dL (ref 6–20)
CO2: 26 mmol/L (ref 22–32)
Calcium: 9.1 mg/dL (ref 8.9–10.3)
Chloride: 105 mmol/L (ref 98–111)
Creatinine: 1 mg/dL (ref 0.61–1.24)
GFR, Estimated: >60 mL/min (ref >60)
Glucose, Bld: 92 mg/dL (ref 70–99)
Potassium: 4.3 mmol/L (ref 3.5–5.1)
Sodium: 139 mmol/L (ref 135–145)
Total Bilirubin: 0.4 mg/dL (ref 0.3–1.2)
Total Protein: 7.4 g/dL (ref 6.5–8.1)

## 2022-08-24 LAB — IRON AND IRON BINDING CAPACITY (CC-WL,HP ONLY)
Iron: 54 ug/dL (ref 45–182)
Saturation Ratios: 16 % — ABNORMAL LOW (ref 17.9–39.5)
TIBC: 344 ug/dL (ref 250–450)
UIBC: 290 ug/dL (ref 117–376)

## 2022-08-24 LAB — FERRITIN: Ferritin: 24 ng/mL (ref 24–336)

## 2022-08-24 NOTE — Progress Notes (Signed)
Hematology and Oncology Follow Up Visit  Drew [name] [id number] 1960-05-06 62 y.o. 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 He is doing quite well and has no complaints at this time.  He is now on Wegovy and has lost another 14 lbs since we saw him last.  He is feeling much better. He also notes that his diuretic has significantly helped with fluid reduction and swelling.  No pain or swelling in his extremities. No numbness or tingling.  He has not noted any blood in his stool for over a month. He states that his Gastroenterologist gave him the number to call and schedule his colonoscopy. He is currently caring for his father to he is trying to schedule around that.  No bruising or petechiae.  No fever, chills, n/v, cough, rash, dizziness, SOB, chest pain, palpitations, abdominal pain or changes in bowel or bladder habits.  Appetite is curbed with the [facility]. He is staying well hydrated throughout the day.   ECOG Performance Status: 1 - Symptomatic but completely ambulatory  Medications:  Allergies as of 08/24/2022       Reactions   Tetracyclines & Related Hives, Itching        Medication List        Accurate as of August 24, 2022 10:16 AM. If you have any questions, ask your nurse or doctor.          albuterol 108 (90 Base) MCG/ACT inhaler Commonly known as: VENTOLIN HFA Inhale 2 puffs into the lungs every 6 (six) hours as needed for wheezing or shortness of breath.   allopurinol 100 MG tablet Commonly known as: ZYLOPRIM Take 1 tablet (100 mg total) by mouth daily.   carvedilol 12.5 MG tablet Commonly known as: COREG Take 1 & 1/2 tablets (18.75 mg total) by mouth  2 (two) times daily with a meal.   digoxin 0.125 MG tablet Commonly known as: LANOXIN Take 1 tablet (0.125 mg total) by mouth daily. NEEDS FOLLOW UP APPOINTMENT FOR MORE REFILLS   Eliquis 5 MG Tabs tablet Generic drug: apixaban Take 1 tablet (5 mg total) by mouth 2 (two) times daily.   Entresto 97-103 MG Generic drug: sacubitril-valsartan Take 1 tablet by mouth 2 (two) times daily.   Farxiga 10 MG Tabs tablet Generic drug: dapagliflozin propanediol Take 1 tablet (10 mg total) by mouth daily.   levothyroxine 25 MCG tablet Commonly known as: SYNTHROID Take 1 tablet (25 mcg total) by mouth daily before breakfast. *Need appointment for future refills.*   melatonin 3 MG Tabs tablet Take 1 tablet (3 mg total) by mouth at bedtime. What changed: additional instructions   potassium chloride SA 20 MEQ tablet Commonly known as: KLOR-CON M Take 2 tablets (40 mEq total) by mouth daily.   spironolactone 25 MG tablet Commonly known as: ALDACTONE Take 1 tablet (25 mg total) by mouth daily.   torsemide 20 MG tablet Commonly known as: DEMADEX Take 4 tablets (80 mg total) by mouth daily. *Need appointment.*   Wegovy 2.4 MG/0.75ML Soaj Generic drug: Semaglutide-Weight Management Inject 2.4 mg into the skin once a week.  Allergies:  Allergies  Allergen Reactions   Tetracyclines & Related Hives and Itching    Past Medical History, Surgical history, Social history, and Family History were reviewed and updated.  Review of Systems: All other 10 point review of systems is negative.   Physical Exam:  vitals were not taken for this visit.   Wt Readings from Last 3 Encounters:  05/18/22 (!) 309 lb 12.8 oz (140.5 kg)  02/26/22 (!) 315 lb (142.9 kg)  02/23/22 (!) 315 lb 1.9 oz (142.9 kg)    Ocular: Sclerae unicteric, pupils equal, round and reactive to light Ear-nose-throat: Oropharynx clear, dentition fair Lymphatic: No cervical or supraclavicular adenopathy Lungs no  rales or rhonchi, good excursion bilaterally Heart regular rate and rhythm, no murmur appreciated Abd soft, nontender, positive bowel sounds MSK no focal spinal tenderness, no joint edema Neuro: non-focal, well-oriented, appropriate affect Breasts: Deferred   Lab Results  Component Value Date   WBC 5.8 05/18/2022   HGB 11.8 (L) 05/18/2022   HCT 34.8 (L) 05/18/2022   MCV 92.6 05/18/2022   PLT 232 05/18/2022   Lab Results  Component Value Date   FERRITIN 22 (L) 05/18/2022   IRON 61 05/18/2022   TIBC 333 05/18/2022   UIBC 272 05/18/2022   IRONPCTSAT 18 05/18/2022   Lab Results  Component Value Date   RETICCTPCT 1.9 11/23/2020   RBC 3.76 (L) 05/18/2022   No results found for: "KPAFRELGTCHN", "LAMBDASER", "KAPLAMBRATIO" No results found for: "IGGSERUM", "IGA", "IGMSERUM" No results found for: "TOTALPROTELP", "ALBUMINELP", "A1GS", "A2GS", "BETS", "BETA2SER", "GAMS", "MSPIKE", "SPEI"   Chemistry      Component Value Date/Time   NA 136 05/18/2022 0854   NA 146 (H) 02/05/2020 1114   K 3.7 05/18/2022 0854   CL 106 05/18/2022 0854   CO2 23 05/18/2022 0854   BUN 13 05/18/2022 0854   BUN 16 02/05/2020 1114   CREATININE 0.94 05/18/2022 0854   CREATININE 1.26 11/13/2019 1454      Component Value Date/Time   CALCIUM 8.5 (L) 05/18/2022 0854   ALKPHOS 42 05/18/2022 0854   AST 10 (L) 05/18/2022 0854   ALT 7 05/18/2022 0854   BILITOT 0.3 05/18/2022 0854       Impression and Plan: Drew [name] is a very pleasant 62 yo African American gentleman with history of right lower and middle lobe PEs with no evidence of right heart strain and left lower extremity DVT involving the left peroneal veins with atrial flutter (TEE and cardioversion on 03/07/20). DVT and PE have resolved on follow-up scans in 2022.   He is on lifelong anticoagulation with Eliquis. No changes.  He has also had some mild IDA.  Iron studies are pending.  Follow-up in 4 months.   Eileen Stanford, NP 8/9/202410:16 AM

## 2022-08-30 ENCOUNTER — Other Ambulatory Visit: Payer: Self-pay | Admitting: Medical

## 2022-08-30 ENCOUNTER — Other Ambulatory Visit (HOSPITAL_COMMUNITY): Payer: Self-pay | Admitting: Cardiology

## 2022-08-30 ENCOUNTER — Other Ambulatory Visit: Payer: Self-pay | Admitting: Cardiology

## 2022-08-31 ENCOUNTER — Other Ambulatory Visit (HOSPITAL_BASED_OUTPATIENT_CLINIC_OR_DEPARTMENT_OTHER): Payer: Self-pay

## 2022-08-31 ENCOUNTER — Other Ambulatory Visit: Payer: Self-pay

## 2022-08-31 ENCOUNTER — Encounter (HOSPITAL_BASED_OUTPATIENT_CLINIC_OR_DEPARTMENT_OTHER): Payer: Self-pay

## 2022-08-31 MED ORDER — ALLOPURINOL 100 MG PO TABS
100.0000 mg | ORAL_TABLET | Freq: Every day | ORAL | 0 refills | Status: DC
  Filled 2022-08-31: qty 30, 30d supply, fill #0

## 2022-08-31 MED ORDER — SPIRONOLACTONE 25 MG PO TABS
25.0000 mg | ORAL_TABLET | Freq: Every day | ORAL | 0 refills | Status: DC
  Filled 2022-08-31: qty 90, 90d supply, fill #0

## 2022-08-31 MED ORDER — CARVEDILOL 12.5 MG PO TABS
18.7500 mg | ORAL_TABLET | Freq: Two times a day (BID) | ORAL | 0 refills | Status: DC
  Filled 2022-08-31: qty 180, 60d supply, fill #0

## 2022-08-31 MED ORDER — WEGOVY 2.4 MG/0.75ML ~~LOC~~ SOAJ
2.4000 mg | SUBCUTANEOUS | 0 refills | Status: DC
  Filled 2022-08-31: qty 6, 56d supply, fill #0
  Filled 2022-10-24: qty 3, 28d supply, fill #1

## 2022-09-03 ENCOUNTER — Other Ambulatory Visit (HOSPITAL_BASED_OUTPATIENT_CLINIC_OR_DEPARTMENT_OTHER): Payer: Self-pay

## 2022-09-03 MED ORDER — CHLORHEXIDINE GLUCONATE 0.12 % MT SOLN
15.0000 mL | Freq: Two times a day (BID) | OROMUCOSAL | 0 refills | Status: DC
  Filled 2022-09-03: qty 473, 16d supply, fill #0

## 2022-09-03 MED ORDER — AMOXICILLIN 875 MG PO TABS
875.0000 mg | ORAL_TABLET | Freq: Two times a day (BID) | ORAL | 0 refills | Status: DC
  Filled 2022-09-03: qty 10, 5d supply, fill #0

## 2022-09-03 MED ORDER — IBUPROFEN 600 MG PO TABS
600.0000 mg | ORAL_TABLET | Freq: Four times a day (QID) | ORAL | 0 refills | Status: DC | PRN
  Filled 2022-09-03: qty 20, 5d supply, fill #0

## 2022-10-23 ENCOUNTER — Encounter: Payer: Self-pay | Admitting: Family

## 2022-10-24 ENCOUNTER — Other Ambulatory Visit: Payer: Self-pay | Admitting: Medical

## 2022-10-24 ENCOUNTER — Other Ambulatory Visit (HOSPITAL_BASED_OUTPATIENT_CLINIC_OR_DEPARTMENT_OTHER): Payer: Self-pay

## 2022-10-24 MED ORDER — LEVOTHYROXINE SODIUM 25 MCG PO TABS
25.0000 ug | ORAL_TABLET | Freq: Every day | ORAL | 0 refills | Status: DC
  Filled 2022-10-24: qty 30, 30d supply, fill #0

## 2022-10-24 MED ORDER — ALLOPURINOL 100 MG PO TABS
100.0000 mg | ORAL_TABLET | Freq: Every day | ORAL | 0 refills | Status: DC
  Filled 2022-10-24: qty 30, 30d supply, fill #0

## 2022-11-29 ENCOUNTER — Other Ambulatory Visit: Payer: Self-pay | Admitting: Medical

## 2022-11-29 ENCOUNTER — Other Ambulatory Visit: Payer: Self-pay | Admitting: Cardiology

## 2022-11-29 ENCOUNTER — Other Ambulatory Visit (HOSPITAL_COMMUNITY): Payer: Self-pay | Admitting: Cardiology

## 2022-11-30 ENCOUNTER — Other Ambulatory Visit: Payer: Self-pay

## 2022-11-30 ENCOUNTER — Other Ambulatory Visit (HOSPITAL_BASED_OUTPATIENT_CLINIC_OR_DEPARTMENT_OTHER): Payer: Self-pay

## 2022-11-30 MED ORDER — ALLOPURINOL 100 MG PO TABS
100.0000 mg | ORAL_TABLET | Freq: Every day | ORAL | 0 refills | Status: DC
  Filled 2022-11-30: qty 30, 30d supply, fill #0

## 2022-11-30 NOTE — Telephone Encounter (Signed)
This is a CHF pt 

## 2022-12-03 ENCOUNTER — Other Ambulatory Visit (HOSPITAL_BASED_OUTPATIENT_CLINIC_OR_DEPARTMENT_OTHER): Payer: Self-pay

## 2022-12-03 MED ORDER — SPIRONOLACTONE 25 MG PO TABS
25.0000 mg | ORAL_TABLET | Freq: Every day | ORAL | 0 refills | Status: DC
  Filled 2022-12-03: qty 90, 90d supply, fill #0

## 2022-12-03 MED ORDER — ENTRESTO 97-103 MG PO TABS
1.0000 | ORAL_TABLET | Freq: Two times a day (BID) | ORAL | 2 refills | Status: DC
  Filled 2022-12-03: qty 180, 90d supply, fill #0
  Filled 2023-01-31 – 2023-09-05 (×2): qty 180, 90d supply, fill #1

## 2022-12-03 MED ORDER — DIGOXIN 125 MCG PO TABS
0.1250 mg | ORAL_TABLET | Freq: Every day | ORAL | 0 refills | Status: DC
  Filled 2022-12-03: qty 90, 90d supply, fill #0

## 2022-12-03 MED ORDER — DAPAGLIFLOZIN PROPANEDIOL 10 MG PO TABS
10.0000 mg | ORAL_TABLET | Freq: Every day | ORAL | 3 refills | Status: DC
  Filled 2022-12-03: qty 90, 90d supply, fill #0
  Filled 2023-01-31 – 2023-10-10 (×2): qty 90, 90d supply, fill #1

## 2022-12-03 MED ORDER — CARVEDILOL 12.5 MG PO TABS
18.7500 mg | ORAL_TABLET | Freq: Two times a day (BID) | ORAL | 0 refills | Status: DC
  Filled 2022-12-03: qty 180, 60d supply, fill #0

## 2022-12-03 MED ORDER — WEGOVY 2.4 MG/0.75ML ~~LOC~~ SOAJ
2.4000 mg | SUBCUTANEOUS | 0 refills | Status: DC
  Filled 2022-12-03: qty 3, 28d supply, fill #0

## 2022-12-03 NOTE — Telephone Encounter (Signed)
Has f/u with HF clinic in December

## 2022-12-24 ENCOUNTER — Encounter: Payer: Self-pay | Admitting: Medical Oncology

## 2022-12-24 ENCOUNTER — Inpatient Hospital Stay (HOSPITAL_BASED_OUTPATIENT_CLINIC_OR_DEPARTMENT_OTHER): Payer: Medicare Other | Admitting: Medical Oncology

## 2022-12-24 ENCOUNTER — Ambulatory Visit: Payer: Medicare Other | Admitting: Medical

## 2022-12-24 ENCOUNTER — Inpatient Hospital Stay: Payer: Medicare Other | Attending: Hematology & Oncology

## 2022-12-24 VITALS — BP 138/76 | HR 58 | Temp 98.3°F | Resp 19 | Ht 71.0 in | Wt 285.1 lb

## 2022-12-24 DIAGNOSIS — Z86711 Personal history of pulmonary embolism: Secondary | ICD-10-CM | POA: Diagnosis present

## 2022-12-24 DIAGNOSIS — I2609 Other pulmonary embolism with acute cor pulmonale: Secondary | ICD-10-CM

## 2022-12-24 DIAGNOSIS — Z7901 Long term (current) use of anticoagulants: Secondary | ICD-10-CM | POA: Insufficient documentation

## 2022-12-24 DIAGNOSIS — Z86718 Personal history of other venous thrombosis and embolism: Secondary | ICD-10-CM | POA: Diagnosis not present

## 2022-12-24 DIAGNOSIS — D509 Iron deficiency anemia, unspecified: Secondary | ICD-10-CM | POA: Diagnosis not present

## 2022-12-24 DIAGNOSIS — I824Y2 Acute embolism and thrombosis of unspecified deep veins of left proximal lower extremity: Secondary | ICD-10-CM

## 2022-12-24 LAB — CBC WITH DIFFERENTIAL (CANCER CENTER ONLY)
Abs Immature Granulocytes: 0 10*3/uL (ref 0.00–0.07)
Basophils Absolute: 0 10*3/uL (ref 0.0–0.1)
Basophils Relative: 1 %
Eosinophils Absolute: 0.3 10*3/uL (ref 0.0–0.5)
Eosinophils Relative: 5 %
HCT: 35.2 % — ABNORMAL LOW (ref 39.0–52.0)
Hemoglobin: 12 g/dL — ABNORMAL LOW (ref 13.0–17.0)
Immature Granulocytes: 0 %
Lymphocytes Relative: 29 %
Lymphs Abs: 1.5 10*3/uL (ref 0.7–4.0)
MCH: 33.6 pg (ref 26.0–34.0)
MCHC: 34.1 g/dL (ref 30.0–36.0)
MCV: 98.6 fL (ref 80.0–100.0)
Monocytes Absolute: 0.4 10*3/uL (ref 0.1–1.0)
Monocytes Relative: 7 %
Neutro Abs: 3 10*3/uL (ref 1.7–7.7)
Neutrophils Relative %: 58 %
Platelet Count: 211 10*3/uL (ref 150–400)
RBC: 3.57 MIL/uL — ABNORMAL LOW (ref 4.22–5.81)
RDW: 13.1 % (ref 11.5–15.5)
WBC Count: 5.1 10*3/uL (ref 4.0–10.5)
nRBC: 0 % (ref 0.0–0.2)

## 2022-12-24 LAB — CMP (CANCER CENTER ONLY)
ALT: 7 U/L (ref 0–44)
AST: 11 U/L — ABNORMAL LOW (ref 15–41)
Albumin: 4.1 g/dL (ref 3.5–5.0)
Alkaline Phosphatase: 41 U/L (ref 38–126)
Anion gap: 10 (ref 5–15)
BUN: 11 mg/dL (ref 6–20)
CO2: 24 mmol/L (ref 22–32)
Calcium: 8.7 mg/dL — ABNORMAL LOW (ref 8.9–10.3)
Chloride: 106 mmol/L (ref 98–111)
Creatinine: 0.84 mg/dL (ref 0.61–1.24)
GFR, Estimated: >60 mL/min (ref >60)
Glucose, Bld: 80 mg/dL (ref 70–99)
Potassium: 3.5 mmol/L (ref 3.5–5.1)
Sodium: 140 mmol/L (ref 135–145)
Total Bilirubin: 0.8 mg/dL (ref <1.2)
Total Protein: 6.9 g/dL (ref 6.5–8.1)

## 2022-12-24 LAB — FERRITIN: Ferritin: 20 ng/mL — ABNORMAL LOW (ref 24–336)

## 2022-12-24 LAB — IRON AND IRON BINDING CAPACITY (CC-WL,HP ONLY)
Iron: 94 ug/dL (ref 45–182)
Saturation Ratios: 25 % (ref 17.9–39.5)
TIBC: 375 ug/dL (ref 250–450)
UIBC: 281 ug/dL (ref 117–376)

## 2022-12-24 NOTE — Progress Notes (Signed)
Hematology and Oncology Follow Up Visit  Drew [name] [id number] November 24, 1960 62 y.o. 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   He states that he is doing well. He has no concerns today.   He continues to be on Wegovy which he has done well with.   Overall he is feeling well. He also notes that his diuretic has significantly helped with fluid reduction and swelling.  No pain or swelling in his extremities. No numbness or tingling.  No recent blood in stool. 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  No bruising or petechiae.  No fever, chills, n/v, cough, rash, dizziness, SOB, chest pain, palpitations, abdominal pain or changes in bowel or bladder habits.   ECOG Performance Status: 1 - Symptomatic but completely ambulatory  Wt Readings from Last 3 Encounters:  12/24/22 285 lb 1.9 oz (129.3 kg)  08/24/22 295 lb 1.9 oz (133.9 kg)  05/18/22 (!) 309 lb 12.8 oz (140.5 kg)     Medications:  Allergies as of 12/24/2022       Reactions   Tetracyclines & Related Hives, Itching        Medication List        Accurate as of December 24, 2022 12:07 PM. If you have any questions, ask your nurse or doctor.          STOP taking these medications    amoxicillin 875 MG tablet Commonly known as: AMOXIL Stopped by: Rushie Chestnut   chlorhexidine 0.12 % solution Commonly known as: PERIDEX Stopped by: Rushie Chestnut   ibuprofen 600 MG tablet Commonly known as: ADVIL Stopped by: Rushie Chestnut       TAKE these medications    albuterol 108 (90 Base) MCG/ACT inhaler Commonly known as: VENTOLIN HFA Inhale 2  puffs into the lungs every 6 (six) hours as needed for wheezing or shortness of breath.   allopurinol 100 MG tablet Commonly known as: ZYLOPRIM Take 1 tablet (100 mg total) by mouth daily.   carvedilol 12.5 MG tablet Commonly known as: COREG Take 1.5 tablets (18.75 mg total) by mouth 2 (two) times daily with a meal. *NEED FOLLOW UP APPOINTMENT FOR MORE REFILLS.*   digoxin 0.125 MG tablet Commonly known as: LANOXIN Take 1 tablet (0.125 mg total) by mouth daily. *NEED FOLLOW UP APPOINTMENT FOR MORE REFILLS.*   Eliquis 5 MG Tabs tablet Generic drug: apixaban Take 1 tablet (5 mg total) by mouth 2 (two) times daily.   Entresto 97-103 MG Generic drug: sacubitril-valsartan Take 1 tablet by mouth 2 (two) times daily.   Farxiga 10 MG Tabs tablet Generic drug: dapagliflozin propanediol Take 1 tablet (10 mg total) by mouth daily.   levothyroxine 25 MCG tablet Commonly known as: SYNTHROID Take 1 tablet (25 mcg total) by mouth daily before breakfast. *Need appointment for future refills.*   melatonin 3 MG Tabs tablet Take 1 tablet (3 mg total) by mouth at bedtime. What changed: additional instructions   potassium chloride SA 20 MEQ tablet Commonly known as: KLOR-CON M Take 2 tablets (40 mEq total) by mouth  daily.   spironolactone 25 MG tablet Commonly known as: ALDACTONE Take 1 tablet (25 mg total) by mouth daily.*NEED FOLLOW UP APPOINTMENT FOR MORE REFILLS.*   torsemide 20 MG tablet Commonly known as: DEMADEX Take 4 tablets (80 mg total) by mouth daily. *Need appointment.*   Wegovy 2.4 MG/0.75ML Soaj Generic drug: Semaglutide-Weight Management Inject 2.4 mg into the skin once a week.        Allergies:  Allergies  Allergen Reactions   Tetracyclines & Related Hives and Itching    Past Medical History, Surgical history, Social history, and Family History were reviewed and updated.  Review of Systems: All other 10 point review of systems is negative.   Physical  Exam:  height is 5\' 11"  (1.803 m) and weight is 285 lb 1.9 oz (129.3 kg). His oral temperature is 98.3 F (36.8 C). His blood pressure is 138/76 and his pulse is 58 (abnormal). His respiration is 19 and oxygen saturation is 96%.   Wt Readings from Last 3 Encounters:  12/24/22 285 lb 1.9 oz (129.3 kg)  08/24/22 295 lb 1.9 oz (133.9 kg)  05/18/22 (!) 309 lb 12.8 oz (140.5 kg)    Ocular: Sclerae unicteric, pupils equal, round and reactive to light Ear-nose-throat: Oropharynx clear, dentition fair Lymphatic: No cervical or supraclavicular adenopathy Lungs no rales or rhonchi, good excursion bilaterally Heart regular rate and rhythm, no murmur appreciated Abd soft, nontender, positive bowel sounds MSK no focal spinal tenderness, no joint edema Neuro: non-focal, well-oriented, appropriate affect  Lab Results  Component Value Date   WBC 5.1 12/24/2022   HGB 12.0 (L) 12/24/2022   HCT 35.2 (L) 12/24/2022   MCV 98.6 12/24/2022   PLT 211 12/24/2022   Lab Results  Component Value Date   FERRITIN 24 08/24/2022   IRON 54 08/24/2022   TIBC 344 08/24/2022   UIBC 290 08/24/2022   IRONPCTSAT 16 (L) 08/24/2022   Lab Results  Component Value Date   RETICCTPCT 1.9 11/23/2020   RBC 3.57 (L) 12/24/2022   No results found for: "KPAFRELGTCHN", "LAMBDASER", "KAPLAMBRATIO" No results found for: "IGGSERUM", "IGA", "IGMSERUM" No results found for: "TOTALPROTELP", "ALBUMINELP", "A1GS", "A2GS", "BETS", "BETA2SER", "GAMS", "MSPIKE", "SPEI"   Chemistry      Component Value Date/Time   NA 140 12/24/2022 0947   NA 146 (H) 02/05/2020 1114   K 3.5 12/24/2022 0947   CL 106 12/24/2022 0947   CO2 24 12/24/2022 0947   BUN 11 12/24/2022 0947   BUN 16 02/05/2020 1114   CREATININE 0.84 12/24/2022 0947   CREATININE 1.26 11/13/2019 1454      Component Value Date/Time   CALCIUM 8.7 (L) 12/24/2022 0947   ALKPHOS 41 12/24/2022 0947   AST 11 (L) 12/24/2022 0947   ALT 7 12/24/2022 0947   BILITOT 0.8  12/24/2022 0947     Encounter Diagnoses  Name Primary?   Personal history of venous thrombosis and embolism Yes   Current use of long term anticoagulation    Iron deficiency anemia, unspecified iron deficiency anemia type     Impression and Plan: Drew [name] is a very pleasant 62 yo African American gentleman with history of right lower and middle lobe PEs with no evidence of right heart strain and left lower extremity DVT involving the left peroneal veins with atrial flutter (TEE and cardioversion on 03/07/20). DVT and PE have resolved on follow-up scans in 2022.    He is on lifelong anticoagulation with Eliquis. No changes at this time.  He could use  some iron so we will get this scheduled today. Feraheme x 2   RTC 6 months APP, labs (CBC, CMP, iron, ferritin)  Rushie Chestnut, PA-C 12/9/202412:07 PM

## 2022-12-27 ENCOUNTER — Other Ambulatory Visit: Payer: Self-pay | Admitting: Medical Oncology

## 2022-12-31 ENCOUNTER — Ambulatory Visit: Payer: Medicare Other | Admitting: Medical

## 2023-01-01 ENCOUNTER — Inpatient Hospital Stay: Payer: Medicare Other

## 2023-01-01 VITALS — BP 136/70 | HR 64 | Temp 98.3°F | Resp 19

## 2023-01-01 DIAGNOSIS — D509 Iron deficiency anemia, unspecified: Secondary | ICD-10-CM

## 2023-01-01 MED ORDER — SODIUM CHLORIDE 0.9 % IV SOLN
510.0000 mg | Freq: Once | INTRAVENOUS | Status: AC
  Administered 2023-01-01: 510 mg via INTRAVENOUS
  Filled 2023-01-01: qty 17

## 2023-01-01 MED ORDER — SODIUM CHLORIDE 0.9 % IV SOLN
Freq: Once | INTRAVENOUS | Status: AC
Start: 2023-01-01 — End: 2023-01-01

## 2023-01-01 NOTE — Patient Instructions (Signed)
 Ferumoxytol Injection What is this medication? FERUMOXYTOL (FER ue MOX i tol) treats low levels of iron in your body (iron deficiency anemia). Iron is a mineral that plays an important role in making red blood cells, which carry oxygen from your lungs to the rest of your body. This medicine may be used for other purposes; ask your health care provider or pharmacist if you have questions. COMMON BRAND NAME(S): Feraheme What should I tell my care team before I take this medication? They need to know if you have any of these conditions: Anemia not caused by low iron levels High levels of iron in the blood Magnetic resonance imaging (MRI) test scheduled An unusual or allergic reaction to iron, other medications, foods, dyes, or preservatives Pregnant or trying to get pregnant Breastfeeding How should I use this medication? This medication is injected into a vein. It is given by your care team in a hospital or clinic setting. Talk to your care team the use of this medication in children. Special care may be needed. Overdosage: If you think you have taken too much of this medicine contact a poison control center or emergency room at once. NOTE: This medicine is only for you. Do not share this medicine with others. What if I miss a dose? It is important not to miss your dose. Call your care team if you are unable to keep an appointment. What may interact with this medication? Other iron products This list may not describe all possible interactions. Give your health care provider a list of all the medicines, herbs, non-prescription drugs, or dietary supplements you use. Also tell them if you smoke, drink alcohol, or use illegal drugs. Some items may interact with your medicine. What should I watch for while using this medication? Visit your care team regularly. Tell your care team if your symptoms do not start to get better or if they get worse. You may need blood work done while you are taking this  medication. You may need to follow a special diet. Talk to your care team. Foods that contain iron include: whole grains/cereals, dried fruits, beans, or peas, leafy green vegetables, and organ meats (liver, kidney). What side effects may I notice from receiving this medication? Side effects that you should report to your care team as soon as possible: Allergic reactions--skin rash, itching, hives, swelling of the face, lips, tongue, or throat Low blood pressure--dizziness, feeling faint or lightheaded, blurry vision Shortness of breath Side effects that usually do not require medical attention (report to your care team if they continue or are bothersome): Flushing Headache Joint pain Muscle pain Nausea Pain, redness, or irritation at injection site This list may not describe all possible side effects. Call your doctor for medical advice about side effects. You may report side effects to FDA at 1-800-FDA-1088. Where should I keep my medication? This medication is given in a hospital or clinic. It will not be stored at home. NOTE: This sheet is a summary. It may not cover all possible information. If you have questions about this medicine, talk to your doctor, pharmacist, or health care provider.  2024 Elsevier/Gold Standard (2022-06-08 00:00:00)

## 2023-01-02 ENCOUNTER — Ambulatory Visit (INDEPENDENT_AMBULATORY_CARE_PROVIDER_SITE_OTHER): Payer: Medicare Other | Admitting: Medical

## 2023-01-02 ENCOUNTER — Other Ambulatory Visit: Payer: Self-pay | Admitting: Cardiology

## 2023-01-02 VITALS — BP 116/66 | HR 64 | Resp 18 | Ht 71.0 in | Wt 284.4 lb

## 2023-01-02 DIAGNOSIS — Z1211 Encounter for screening for malignant neoplasm of colon: Secondary | ICD-10-CM

## 2023-01-02 DIAGNOSIS — I5022 Chronic systolic (congestive) heart failure: Secondary | ICD-10-CM | POA: Diagnosis not present

## 2023-01-02 DIAGNOSIS — E669 Obesity, unspecified: Secondary | ICD-10-CM

## 2023-01-02 DIAGNOSIS — E039 Hypothyroidism, unspecified: Secondary | ICD-10-CM | POA: Diagnosis not present

## 2023-01-02 DIAGNOSIS — Z87891 Personal history of nicotine dependence: Secondary | ICD-10-CM

## 2023-01-02 DIAGNOSIS — I10 Essential (primary) hypertension: Secondary | ICD-10-CM | POA: Diagnosis not present

## 2023-01-02 DIAGNOSIS — M1000 Idiopathic gout, unspecified site: Secondary | ICD-10-CM

## 2023-01-02 DIAGNOSIS — R350 Frequency of micturition: Secondary | ICD-10-CM | POA: Diagnosis not present

## 2023-01-02 DIAGNOSIS — Z122 Encounter for screening for malignant neoplasm of respiratory organs: Secondary | ICD-10-CM

## 2023-01-02 LAB — T4, FREE: Free T4: 0.77 ng/dL (ref 0.60–1.60)

## 2023-01-02 LAB — PSA: PSA: 0.71 ng/mL (ref 0.10–4.00)

## 2023-01-02 LAB — TSH: TSH: 3.86 u[IU]/mL (ref 0.35–5.50)

## 2023-01-02 LAB — URIC ACID: Uric Acid, Serum: 4.7 mg/dL (ref 4.0–7.8)

## 2023-01-02 NOTE — Progress Notes (Signed)
Subjective:    Patient ID: Drew [name], male    DOB: [birth year], 62 y.o.   MRN: [id number]  HPI  Pt in for follow up. Last saw pt more than a year ago.  "Chf- clinically well controlled. Continue coreg, eliquis, entresto, spirinolactone and torsemide.   For dvt- continue eliquis.   For hypothyroid- continue levothyroxine 25 mcg daily.   For gout- refilled your allopurinol. Will go ahead send in next refill.   Htn- bp well controlled on recheck."   Discussed the use of AI scribe software for clinical note transcription with the patient, who gave verbal consent to proceed.   History of Present Illness   The patient, with a history of congestive heart failure, presents after a significant weight loss of 100 pounds, achieved with the aid of Wegovy. He reports feeling significantly better and more active, engaging in yard work and dog walking as his primary forms of exercise. Despite the weight loss, he has not experienced any changes in his medication regimen, which includes Coreg, Eliquis, Entresto, Spironolactone, and Torsemide. He notes a decreased need for Torsemide due to reduced fluid intake.  The patient recently underwent an iron infusion and is scheduled for another next week. He also reports a history of gout, which is well-controlled with Allopurinol, and he has not experienced any recent flares. He has a history of thyroid disease, managed with Levothyroxine, and his last thyroid function tests were a year ago.  The patient has a history of deep vein thrombosis, managed with Eliquis. He also reports a history of smoking for approximately 25-30 years, at a pack a day, but quit in May 2020. He has not had a recent pulmonary consultation or lung cancer screening, despite his smoking history.  The patient's father is currently in hospice care for lung cancer, which has limited the patient's ability to attend to his own health needs, including scheduling a colonoscopy. He has  not received a flu vaccine but has received three doses of the COVID-19 vaccine.          Review of Systems  Constitutional:  Negative for chills, fatigue and fever.  HENT:  Negative for congestion and ear pain.   Respiratory:  Negative for cough, chest tightness, shortness of breath and wheezing.   Cardiovascular:  Negative for chest pain and palpitations.  Gastrointestinal:  Negative for abdominal pain, constipation, diarrhea and nausea.  Genitourinary:  Negative for dysuria, flank pain and hematuria.  Musculoskeletal:  Negative for back pain and myalgias.  Skin:  Negative for rash.  Neurological:  Negative for dizziness, speech difficulty, weakness, numbness and headaches.  Hematological:  Negative for adenopathy. Does not bruise/bleed easily.  Psychiatric/Behavioral:  Negative for behavioral problems and dysphoric mood.     Past Medical History:  Diagnosis Date   Acute CHF (congestive heart failure) (HCC) 05/24/2018   Acute on chronic combined systolic and diastolic CHF (congestive heart failure) (HCC)    Acute systolic HF (heart failure) (HCC) 06/02/2018   AKI (acute kidney injury) (HCC) 03/03/2020   Alcohol use 05/24/2018   Allergy    Chronic systolic CHF (congestive heart failure) (HCC) 03/03/2020   Dilated cardiomyopathy (HCC)    Essential hypertension 05/24/2018   HFrEF (heart failure with reduced ejection fraction) (HCC) 11/20/2019   HTN (hypertension)    Hyperlipidemia 05/24/2018   Hypokalemia 03/03/2020   IDA (iron deficiency anemia) 08/23/2020   Knee pain, left 02/11/2014   New onset of congestive heart failure (HCC) 05/23/2018   Nonischemic  cardiomyopathy (HCC) 11/20/2019   Obesity, Class III, BMI 40-49.9 (morbid obesity) (HCC) 05/24/2018   Pulmonary embolism (HCC) 03/03/2020   Seasonal allergies 02/11/2014   Tobacco abuse 05/24/2018   Typical atrial flutter (HCC)    Wellness examination 02/26/2014     Social History   Socioeconomic History   Marital status: Married    Spouse  name: Not on file   Number of children: Not on file   Years of education: Not on file   Highest education level: Not on file  Occupational History   Not on file  Tobacco Use   Smoking status: Former    Current packs/day: 0.00    Average packs/day: 1 pack/day for 20.0 years (20.0 ttl pk-yrs)    Types: Cigarettes    Start date: 05/23/1998    Quit date: 05/23/2018    Years since quitting: 4.6   Smokeless tobacco: Never  Vaping Use   Vaping status: Never Used  Substance and Sexual Activity   Alcohol use: Yes    Alcohol/week: 0.0 standard drinks of alcohol    Comment: 6 pack a week spread out over weekend   Drug use: No   Sexual activity: Yes  Other Topics Concern   Not on file  Social History Narrative   [employer].   Married lives with wife, mother in law and daughter.  7 children.     Social Drivers of Health   Financial Resource Strain: High Risk (09/07/2020)   Overall Financial Resource Strain (CARDIA)    Difficulty of Paying Living Expenses: Hard  Food Insecurity: No Food Insecurity (09/07/2020)   Hunger Vital Sign    Worried About Running Out of Food in the Last Year: Never true    Ran Out of Food in the Last Year: Never true  Transportation Needs: No Transportation Needs (09/07/2020)   PRAPARE - Administrator, Civil Service (Medical): No    Lack of Transportation (Non-Medical): No  Physical Activity: Unknown (05/24/2018)   Exercise Vital Sign    Days of Exercise per Week: Patient declined    Minutes of Exercise per Session: Patient declined  Stress: Not on file  Social Connections: Unknown (05/24/2018)   Social Connection and Isolation Panel [NHANES]    Frequency of Communication with Friends and Family: Patient declined    Frequency of Social Gatherings with Friends and Family: Patient declined    Attends Religious Services: Patient declined    Active Member of Clubs or Organizations: Patient declined    Attends Banker Meetings: Patient declined     Marital Status: Patient declined  Intimate Partner Violence: Unknown (05/24/2018)   Humiliation, Afraid, Rape, and Kick questionnaire    Fear of Current or Ex-Partner: Patient declined    Emotionally Abused: Patient declined    Physically Abused: Patient declined    Sexually Abused: Patient declined    Past Surgical History:  Procedure Laterality Date   CARDIOVERSION N/A 03/07/2020   Procedure: CARDIOVERSION;  Surgeon: Laurey Morale, MD;  Location: [facility] ENDOSCOPY;  Service: Cardiovascular;  Laterality: N/A;   KNEE ARTHROSCOPY  2009   RIGHT/LEFT HEART CATH AND CORONARY ANGIOGRAPHY N/A 05/26/2018   Procedure: RIGHT/LEFT HEART CATH AND CORONARY ANGIOGRAPHY;  Surgeon: Lyn Records, MD;  Location: [facility] INVASIVE CV LAB;  Service: Cardiovascular;  Laterality: N/A;   TEE WITHOUT CARDIOVERSION N/A 03/07/2020   Procedure: TRANSESOPHAGEAL ECHOCARDIOGRAM (TEE);  Surgeon: Laurey Morale, MD;  Location: [facility] ENDOSCOPY;  Service: Cardiovascular;  Laterality: N/A;   THROAT SURGERY  2005    Family History  Problem Relation Age of Onset   Hearing loss Mother    Cancer Father    Heart failure Other    Heart failure Paternal Grandfather     Allergies  Allergen Reactions   Tetracyclines & Related Hives and Itching    Current Outpatient Medications on File Prior to Visit  Medication Sig Dispense Refill   albuterol (VENTOLIN HFA) 108 (90 Base) MCG/ACT inhaler Inhale 2 puffs into the lungs every 6 (six) hours as needed for wheezing or shortness of breath. 6.7 g 2   allopurinol (ZYLOPRIM) 100 MG tablet Take 1 tablet (100 mg total) by mouth daily. 30 tablet 0   apixaban (ELIQUIS) 5 MG TABS tablet Take 1 tablet (5 mg total) by mouth 2 (two) times daily. 180 tablet 2   carvedilol (COREG) 12.5 MG tablet Take 1.5 tablets (18.75 mg total) by mouth 2 (two) times daily with a meal. *NEED FOLLOW UP APPOINTMENT FOR MORE REFILLS.* 180 tablet 0   dapagliflozin propanediol (FARXIGA) 10 MG TABS tablet Take 1 tablet (10  mg total) by mouth daily. 90 tablet 3   digoxin (LANOXIN) 0.125 MG tablet Take 1 tablet (0.125 mg total) by mouth daily. *NEED FOLLOW UP APPOINTMENT FOR MORE REFILLS.* 90 tablet 0   levothyroxine (SYNTHROID) 25 MCG tablet Take 1 tablet (25 mcg total) by mouth daily before breakfast. *Need appointment for future refills.* 30 tablet 0   melatonin 3 MG TABS tablet Take 1 tablet (3 mg total) by mouth at bedtime. (Patient taking differently: Take 3 mg by mouth at bedtime. As needed) 90 tablet 3   potassium chloride SA (KLOR-CON M) 20 MEQ tablet Take 2 tablets (40 mEq total) by mouth daily. 60 tablet 6   sacubitril-valsartan (ENTRESTO) 97-103 MG Take 1 tablet by mouth 2 (two) times daily. 180 tablet 2   spironolactone (ALDACTONE) 25 MG tablet Take 1 tablet (25 mg total) by mouth daily.*NEED FOLLOW UP APPOINTMENT FOR MORE REFILLS.* 90 tablet 0   torsemide (DEMADEX) 20 MG tablet Take 4 tablets (80 mg total) by mouth daily. *Need appointment.* 60 tablet 0   WEGOVY 2.4 MG/0.75ML SOAJ Inject 2.4 mg into the skin once a week. 3 mL 0   No current facility-administered medications on file prior to visit.    BP 116/66   Pulse 64   Resp 18   Ht 5\' 11"  (1.803 m)   Wt 284 lb 6.4 oz (129 kg)   SpO2 95%   BMI 39.67 kg/m        Objective:   Physical Exam  General Mental Status- Alert. General Appearance- Not in acute distress.   Skin General: Color- Normal Color. Moisture- Normal Moisture.  Neck Carotid Arteries- Normal color. Moisture- Normal Moisture. No carotid bruits. No JVD.  Chest and Lung Exam Auscultation: Breath Sounds:-Normal.  Cardiovascular Auscultation:Rythm- Regular. Murmurs & Other Heart Sounds:Auscultation of the heart reveals- No Murmurs.  Abdomen Inspection:-Inspeection Normal. Palpation/Percussion:Note:No mass. Palpation and Percussion of the abdomen reveal- Non Tender, Non Distended + BS, no rebound or guarding.   Neurologic Cranial Nerve exam:- CN III-XII intact(No  nystagmus), symmetric smile. Strength:- 5/5 equal and symmetric strength both upper and lower extremities.       Assessment & Plan:   Assessment and Plan    Obesity Significant weight loss of approximately 100 lbs with [id number]. Improved exercise tolerance. -Continue Wegovy as prescribed by cardiologist.  Congestive Heart Failure Stable on Coreg, Eliquis, Entresto, Spironolactone, and Torsemide. No recent symptoms of  fluid overload. -Continue current medications. -Refill Torsemide as needed. -Plan to follow up with cardiologist.  Hypothyroidism On Levothyroxine, but TSH and T4 not checked in a year. -Order TSH and T4 today.  Gout No recent flares, taking Allopurinol 100mg  for prevention. -Continue Allopurinol 100mg .  Hypertension Well controlled, likely improved with weight loss. -Continue current antihypertensive regimen.  General Health Maintenance -Order PSA today. -Consider COVID vaccine booster. -Refer for colonoscopy, as it has been delayed. -Refer to pulmonology for lung cancer screening due to history of smoking.   Follow up in 6 months or sooner if needed

## 2023-01-02 NOTE — Patient Instructions (Signed)
Obesity Significant weight loss of approximately 100 lbs with [id number]. Improved exercise tolerance. -Continue Wegovy as prescribed by cardiologist.  Congestive Heart Failure Stable on Coreg, Eliquis, Entresto, Spironolactone, and Torsemide. No recent symptoms of fluid overload. -Continue current medications. -Refill Torsemide as needed. -Plan to follow up with cardiologist.  Hypothyroidism On Levothyroxine, but TSH and T4 not checked in a year. -Order TSH and T4 today.  Gout No recent flares, taking Allopurinol 100mg  for prevention. -Continue Allopurinol 100mg .  Hypertension Well controlled, likely improved with weight loss. -Continue current antihypertensive regimen.  General Health Maintenance -Order PSA today. -Consider COVID vaccine booster. -Refer for colonoscopy, as it has been delayed. -Refer to pulmonology for lung cancer screening due to history of smoking.   Follow up in 6 months or sooner if needed

## 2023-01-03 ENCOUNTER — Encounter: Payer: Self-pay | Admitting: Medical

## 2023-01-03 ENCOUNTER — Other Ambulatory Visit (HOSPITAL_BASED_OUTPATIENT_CLINIC_OR_DEPARTMENT_OTHER): Payer: Self-pay

## 2023-01-03 MED ORDER — WEGOVY 2.4 MG/0.75ML ~~LOC~~ SOAJ
2.4000 mg | SUBCUTANEOUS | 0 refills | Status: DC
  Filled 2023-01-03: qty 3, 28d supply, fill #0

## 2023-01-04 ENCOUNTER — Other Ambulatory Visit (HOSPITAL_BASED_OUTPATIENT_CLINIC_OR_DEPARTMENT_OTHER): Payer: Self-pay

## 2023-01-08 ENCOUNTER — Inpatient Hospital Stay: Payer: Medicare Other

## 2023-01-08 VITALS — BP 97/67 | HR 64 | Temp 98.1°F | Resp 18

## 2023-01-08 DIAGNOSIS — D509 Iron deficiency anemia, unspecified: Secondary | ICD-10-CM | POA: Diagnosis not present

## 2023-01-08 MED ORDER — SODIUM CHLORIDE 0.9 % IV SOLN: Freq: Once | INTRAVENOUS | Status: AC

## 2023-01-08 MED ORDER — FERUMOXYTOL INJECTION 510 MG/17 ML
510.0000 mg | Freq: Once | INTRAVENOUS | Status: AC
  Administered 2023-01-08: 510 mg via INTRAVENOUS
  Filled 2023-01-08: qty 17

## 2023-01-31 ENCOUNTER — Other Ambulatory Visit (HOSPITAL_BASED_OUTPATIENT_CLINIC_OR_DEPARTMENT_OTHER): Payer: Self-pay

## 2023-01-31 ENCOUNTER — Other Ambulatory Visit: Payer: Self-pay | Admitting: Medical

## 2023-01-31 ENCOUNTER — Other Ambulatory Visit (HOSPITAL_COMMUNITY): Payer: Self-pay | Admitting: Cardiology

## 2023-01-31 ENCOUNTER — Other Ambulatory Visit: Payer: Self-pay | Admitting: Cardiology

## 2023-01-31 ENCOUNTER — Encounter: Payer: Self-pay | Admitting: Family

## 2023-01-31 DIAGNOSIS — I824Y2 Acute embolism and thrombosis of unspecified deep veins of left proximal lower extremity: Secondary | ICD-10-CM

## 2023-01-31 DIAGNOSIS — I2699 Other pulmonary embolism without acute cor pulmonale: Secondary | ICD-10-CM

## 2023-01-31 MED ORDER — SPIRONOLACTONE 25 MG PO TABS
25.0000 mg | ORAL_TABLET | Freq: Every day | ORAL | 0 refills | Status: DC
  Filled 2023-01-31 – 2023-07-15 (×2): qty 90, 90d supply, fill #0

## 2023-01-31 MED ORDER — CARVEDILOL 12.5 MG PO TABS
18.7500 mg | ORAL_TABLET | Freq: Two times a day (BID) | ORAL | 0 refills | Status: DC
  Filled 2023-01-31: qty 180, 60d supply, fill #0

## 2023-01-31 MED ORDER — DIGOXIN 125 MCG PO TABS
0.1250 mg | ORAL_TABLET | Freq: Every day | ORAL | 0 refills | Status: DC
  Filled 2023-01-31 – 2023-09-19 (×4): qty 90, 90d supply, fill #0

## 2023-01-31 MED ORDER — LEVOTHYROXINE SODIUM 25 MCG PO TABS
25.0000 ug | ORAL_TABLET | Freq: Every day | ORAL | 0 refills | Status: DC
  Filled 2023-01-31: qty 30, 30d supply, fill #0

## 2023-01-31 MED ORDER — POTASSIUM CHLORIDE CRYS ER 20 MEQ PO TBCR
40.0000 meq | EXTENDED_RELEASE_TABLET | Freq: Every day | ORAL | 1 refills | Status: DC
  Filled 2023-01-31 – 2023-04-01 (×2): qty 60, 30d supply, fill #0
  Filled 2023-09-05: qty 60, 30d supply, fill #1

## 2023-01-31 MED ORDER — WEGOVY 2.4 MG/0.75ML ~~LOC~~ SOAJ
2.4000 mg | SUBCUTANEOUS | 0 refills | Status: DC
  Filled 2023-01-31: qty 3, 28d supply, fill #0

## 2023-01-31 MED ORDER — ALLOPURINOL 100 MG PO TABS
100.0000 mg | ORAL_TABLET | Freq: Every day | ORAL | 0 refills | Status: DC
  Filled 2023-01-31: qty 30, 30d supply, fill #0

## 2023-01-31 NOTE — Telephone Encounter (Signed)
Prescription refill request for Eliquis received. Indication: Aflutter Last office visit: 10/11/21 (CHF)  Scr: 0.84 (12/24/22)  Age: 63 Weight: 129kg  Office visit overdue. Message sent to schedulers.

## 2023-01-31 NOTE — Telephone Encounter (Signed)
Pt's pharmacy is requesting a refill on Albuterol inhaler. Would Dr. Mayford Knife like to refill this medication? Please address

## 2023-02-04 ENCOUNTER — Encounter: Payer: Self-pay | Admitting: Family

## 2023-02-04 ENCOUNTER — Other Ambulatory Visit (HOSPITAL_BASED_OUTPATIENT_CLINIC_OR_DEPARTMENT_OTHER): Payer: Self-pay

## 2023-02-04 MED ORDER — APIXABAN 5 MG PO TABS
5.0000 mg | ORAL_TABLET | Freq: Two times a day (BID) | ORAL | 0 refills | Status: DC
  Filled 2023-02-04: qty 60, 30d supply, fill #0

## 2023-02-07 MED ORDER — ALBUTEROL SULFATE HFA 108 (90 BASE) MCG/ACT IN AERS
2.0000 | INHALATION_SPRAY | Freq: Four times a day (QID) | RESPIRATORY_TRACT | 2 refills | Status: AC | PRN
  Filled 2023-02-07: qty 6.7, 25d supply, fill #0
  Filled 2023-05-29: qty 6.7, 25d supply, fill #1

## 2023-02-08 ENCOUNTER — Other Ambulatory Visit (HOSPITAL_BASED_OUTPATIENT_CLINIC_OR_DEPARTMENT_OTHER): Payer: Self-pay

## 2023-02-14 NOTE — Progress Notes (Signed)
Cardiology Office Note    Patient Name: Drew [name] Date of Encounter: 02/15/2023  Primary Care Provider:  Esperanza Richters, PA-C Primary Cardiologist:  Thomasene Ripple, DO Primary Electrophysiologist: None   Past Medical History    Past Medical History:  Diagnosis Date   Acute CHF (congestive heart failure) (HCC) 05/24/2018   Acute on chronic combined systolic and diastolic CHF (congestive heart failure) (HCC)    Acute systolic HF (heart failure) (HCC) 06/02/2018   AKI (acute kidney injury) (HCC) 03/03/2020   Alcohol use 05/24/2018   Allergy    Chronic systolic CHF (congestive heart failure) (HCC) 03/03/2020   Dilated cardiomyopathy (HCC)    Essential hypertension 05/24/2018   HFrEF (heart failure with reduced ejection fraction) (HCC) 11/20/2019   HTN (hypertension)    Hyperlipidemia 05/24/2018   Hypokalemia 03/03/2020   IDA (iron deficiency anemia) 08/23/2020   Knee pain, left 02/11/2014   New onset of congestive heart failure (HCC) 05/23/2018   Nonischemic cardiomyopathy (HCC) 11/20/2019   Obesity, Class III, BMI 40-49.9 (morbid obesity) (HCC) 05/24/2018   Pulmonary embolism (HCC) 03/03/2020   Seasonal allergies 02/11/2014   Tobacco abuse 05/24/2018   Typical atrial flutter (HCC)    Wellness examination 02/26/2014    History of Present Illness  Drew [name] is a 63 y.o. male with a PMH of HFrEF, HTN, HLD, NICM, OSA (on CPAP), atrial flutter (on Eliquis), IDA, prior tobacco abuse, hypo-thyroidism, DVT with PE (2022), obesity who presents today for 1 year follow-up.  Drew [name] was seen initially in 2020, as an inpatient for newly diagnosed cardiomyopathy.  He reported 3 weeks of worsening shortness of breath and underwent a 2D echo EF of 20-25% with severely dilated LV and moderate concentric LVH with diffuse hypokinesis.  He underwent a R/LHC for further evaluation that showed widely patent coronary arteries with mild to moderate PHT no evidence of right heart failure.  He was started on GDMT  with Entresto, carvedilol and Lasix.  Repeat 2D echo was completed on 02/29/2020 that showed EF still severely decreased at 20-25% and he was seen in complaints of 4 days of dyspnea with pleuritic chest pain and sinus tachycardia.  He was referred to the ED where CT angio confirm right middle PE with no evidence of RV strain.  He was admitted and started on IV heparin and lower extremity venous Doppler was positive for left DVT.  He also developed atrial flutter and underwent TEE with DCCV on 03/07/2020 and Eliquis. He was last seen on 10/11/2021 in the advanced heart failure clinic.  He completed an updated TTE on 06/2021 with EF showing 35-40% diffuse hypokinesis and grade 2 DD and moderate LV enlargement.  During his visit he reported feeling well and acknowledge some dyspnea when further distances. He was found to have decreased weight with Wegovy.He was sinus bradycardia with narrow QRS and for CRT therapy. He is currently followed by Dr. Mayford Knife for management of CPAP and was last seen on 08/01/2021.  He is also followed by hematology/oncology for management of IDA.  Drew [name] presents today for overdue follow-up.  He reports significant weight loss of almost 100 pounds, which has improved his mobility and overall health. The patient has been on medications including digoxin and Eliquis, but admits to occasional non-adherence. T his blood pressure today was stable at 134/78 and heart rate was 60 bpm.  He  also reports issues with fluid retention, managed with as-needed torsemide. The patient has been mindful of his fluid and  salt intake, which has helped manage his fluid retention. The patient also has a history of sleep apnea but has not been using his CPAP machine regularly due to irregular sleep patterns.  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 Patient denies chest pain, palpitations, dyspnea, PND, orthopnea, nausea, vomiting, dizziness, syncope,  edema, weight gain, or early satiety.   Review of Systems  Please see the history of present illness.    All other systems reviewed and are otherwise negative except as noted above.  Physical Exam    Wt Readings from Last 3 Encounters:  02/15/23 285 lb 12.8 oz (129.6 kg)  01/02/23 284 lb 6.4 oz (129 kg)  12/24/22 285 lb 1.9 oz (129.3 kg)   VS: Vitals:   02/15/23 0845  BP: 134/78  Pulse: (!) 59  SpO2: 97%  ,Body mass index is 39.86 kg/m. GEN: Well nourished, well developed in no acute distress Neck: No JVD; No carotid bruits Pulmonary: Clear to auscultation without rales, wheezing or rhonchi  Cardiovascular: Normal rate. Regular rhythm. Normal S1. Normal S2.   Murmurs: There is no murmur.  ABDOMEN: Soft, non-tender, non-distended EXTREMITIES:  No edema; No deformity   EKG/LABS/ Recent Cardiac Studies   ECG personally reviewed by me today -sinus rhythm with rate of 60 bpm premature SVT complex and no acute changes   Risk Assessment/Calculations:    CHA2DS2-VASc Score = 3   This indicates a 3.2% annual risk of stroke. The patient's score is based upon: CHF History: 1 HTN History: 1 Diabetes History: 1 Stroke History: 0 Vascular Disease History: 0 Age Score: 0 Gender Score: 0         Lab Results  Component Value Date   WBC 5.1 12/24/2022   HGB 12.0 (L) 12/24/2022   HCT 35.2 (L) 12/24/2022   MCV 98.6 12/24/2022   PLT 211 12/24/2022   Lab Results  Component Value Date   CREATININE 0.84 12/24/2022   BUN 11 12/24/2022   NA 140 12/24/2022   K 3.5 12/24/2022   CL 106 12/24/2022   CO2 24 12/24/2022   Lab Results  Component Value Date   CHOL 177 03/07/2021   HDL 45 03/07/2021   LDLCALC 115 (H) 03/07/2021   TRIG 95 03/07/2021   CHOLHDL 3.9 03/07/2021    Lab Results  Component Value Date   HGBA1C 5.5 03/07/2021   Assessment & Plan    1.  HFrEF: -Patient's last 2D echo completed on 07/11/2021 with EF of 35-40% and LV global hypokinesis with mild LVH  and grade 2 DD. -Today patient is euvolemic on examination today and reports compliance with fluid and salt restrictions. -ContinueCurrent GDMT with carvedilol 18.75 mg twice daily, Farxiga grams, digoxin 0.125 mg, Entresto 97/103 mg, spironolactone 25 mg daily as needed torsemide 20 mg. -Check Digoxin level today.  2.  History of atrial flutter: -Diagnosed in 2022 in the setting of pulmonary embolism -Patient is currently on carvedilol 18.75 mg twice daily -Patient's hemoglobin was 12 and creatinine is 0.88 -Continue Eliquis 5 mg twice daily  3.  History of pulmonary embolism: -Diagnosed in 2022 and on chronic Eliquis -Patient reports some inconsistency with taking his Eliquis and we discussed the importance of medication as prescribed to reduce risk of blood clots.  4.  Obesity: -Patient's BMI is 39.86 kg/m -Patient was advised and encouraged to increase physical activity as tolerated  5. Obstructive Sleep Apnea Non-compliant with CPAP use due  to irregular sleep schedule. No current symptoms of daytime fatigue. -Encouraged to resume CPAP use when possible.  Disposition: Follow-up with Thomasene Ripple, DO or APP in 6 months   Signed, Napoleon Form, Leodis Rains, NP 02/15/2023, 11:33 AM [facility]

## 2023-02-15 ENCOUNTER — Other Ambulatory Visit: Payer: Self-pay

## 2023-02-15 ENCOUNTER — Encounter: Payer: Self-pay | Admitting: Nurse Practitioner

## 2023-02-15 ENCOUNTER — Ambulatory Visit: Payer: Medicare Other | Attending: Nurse Practitioner | Admitting: Nurse Practitioner

## 2023-02-15 VITALS — BP 134/78 | HR 59 | Ht 71.0 in | Wt 285.8 lb

## 2023-02-15 DIAGNOSIS — I5022 Chronic systolic (congestive) heart failure: Secondary | ICD-10-CM | POA: Insufficient documentation

## 2023-02-15 DIAGNOSIS — I2699 Other pulmonary embolism without acute cor pulmonale: Secondary | ICD-10-CM | POA: Insufficient documentation

## 2023-02-15 DIAGNOSIS — Z79899 Other long term (current) drug therapy: Secondary | ICD-10-CM | POA: Diagnosis present

## 2023-02-15 DIAGNOSIS — I483 Typical atrial flutter: Secondary | ICD-10-CM | POA: Insufficient documentation

## 2023-02-15 NOTE — Patient Instructions (Addendum)
Medication Instructions:  Your physician has recommended you make the following change in your medication:  Take torsemide as needed.  Lab Work: Digoxin level  If you have labs (blood work) drawn today and your tests are completely normal, you will receive your results only by: MyChart Message (if you have MyChart) OR A paper copy in the mail If you have any lab test that is abnormal or we need to change your treatment, we will call you to review the results.  Testing/Procedures: None ordered.  Follow-Up: At [facility], you and your health needs are our priority.  As part of our continuing mission to provide you with exceptional heart care, we have created designated Provider Care Teams.  These Care Teams include your primary Cardiologist (physician) and Advanced Practice Providers (APPs -  Physician Assistants and Nurse Practitioners) who all work together to provide you with the care you need, when you need it.   Your next appointment:   6 month  The format for your next appointment:   In Person  Provider:   Dr Servando Salina

## 2023-02-16 LAB — DIGOXIN LEVEL: Digoxin, Serum: <0.4 ng/mL — ABNORMAL LOW (ref 0.5–0.9)

## 2023-02-19 ENCOUNTER — Telehealth: Payer: Self-pay | Admitting: *Deleted

## 2023-02-19 DIAGNOSIS — I5021 Acute systolic (congestive) heart failure: Secondary | ICD-10-CM

## 2023-02-19 DIAGNOSIS — Z79899 Other long term (current) drug therapy: Secondary | ICD-10-CM

## 2023-02-19 DIAGNOSIS — I5020 Unspecified systolic (congestive) heart failure: Secondary | ICD-10-CM

## 2023-02-19 DIAGNOSIS — Z5181 Encounter for therapeutic drug level monitoring: Secondary | ICD-10-CM

## 2023-02-19 NOTE — Telephone Encounter (Signed)
 The patient has been notified of the result and verbalized understanding.  All questions (if any) were answered.  Pt states he is going to set his alarm clock for 5 am (tomorrow morning), and take his dose of digoxin , and come into our lab tomorrow 2/5 at noon.   Reiterated to the pt that he needs to set the alarm for 5 am, then come into the lab tomorrow around noon to have his dig level rechecked.   Also reminded him on how important it is to take his digoxin  as prescribed daily with no missed doses, to avoid poor therapeutic response.   Pt aware I will place the repeat dig level in the system for then and release this so the lab can see.  Informed the pt that I will make Jackee Alberts NP aware of this plan.  Pt verbalized understanding and agrees with this plan.

## 2023-02-19 NOTE — Telephone Encounter (Signed)
-----   Message from Wyn Raddle, Jackee Shove sent at 02/17/2023  2:55 PM EST ----- Please let patient know that his digoxin  level is 0.4 which is considered below the therapeutic range for heart failure management, where a target serum level of 0.5 to 0.9 ng/mL is recommended. Please advise that his levels can be altered if he has missed doses prior to checking his levels  It is recommended that levels are checked 6-8 hours after last dose. Please arrange for recheck of digoxin  level to be completed 8 hours after last dose to ensure accuracy.  -Please also advise patient to not miss any doses to avoid possible toxicity from medication or poor therapeutic response.  Jackee Wyn, NP

## 2023-02-21 LAB — DIGOXIN LEVEL: Digoxin, Serum: <0.4 ng/mL — ABNORMAL LOW (ref 0.5–0.9)

## 2023-02-22 ENCOUNTER — Telehealth: Payer: Self-pay | Admitting: Acute Care

## 2023-02-22 ENCOUNTER — Other Ambulatory Visit: Payer: Self-pay | Admitting: Emergency Medicine

## 2023-02-22 DIAGNOSIS — Z122 Encounter for screening for malignant neoplasm of respiratory organs: Secondary | ICD-10-CM

## 2023-02-22 DIAGNOSIS — Z87891 Personal history of nicotine dependence: Secondary | ICD-10-CM

## 2023-02-22 NOTE — Telephone Encounter (Signed)
 Lung Cancer Screening Narrative/Criteria Questionnaire (Cigarette Smokers Only- No Cigars/Pipes/vapes)   Drew [name]   SDMV:03/11/2023 at 9:00 with [facility]       02/16/1960   LDCT: 03/12/2023 at 9:40 at [facility]     63 y.o.   Phone: [phone]  Lung Screening Narrative (confirm age 19-77 yrs Medicare / 50-80 yrs Private pay insurance)   Insurance information:[employer] MCR   Referring Provider:Edward Saguier - PCP   This screening involves an initial phone call with a team member from our program. It is called a shared decision making visit. The initial meeting is required by  insurance and Medicare to make sure you understand the program. This appointment takes about 15-20 minutes to complete. You will complete the screening scan at your scheduled date/time.  This scan takes about 5-10 minutes to complete. You can eat and drink normally before and after the scan.  Criteria questions for Lung Cancer Screening:   Are you a current or former smoker? Former Age began smoking: 63yo   If you are a former smoker, what year did you quit smoking? 2020(within 15 yrs)   To calculate your smoking history, I need an accurate estimate of how many packs of cigarettes you smoked per day and for how many years. (Not just the number of PPD you are now smoking)   Years smoking 37 x Packs per day 1 = Pack years 37   (at least 20 pack yrs)   (Make sure they understand that we need to know how much they have smoked in the past, not just the number of PPD they are smoking now)  Do you have a personal history of cancer?  No    Do you have a family history of cancer? Yes  (cancer type and and relative) Father - colon   Are you coughing up blood?  No  Have you had unexplained weight loss of 15 lbs or more in the last 6 months? No  It looks like you meet all criteria.  When would be a good time for us  to schedule you for this screening?   Additional information: N/A

## 2023-02-25 ENCOUNTER — Other Ambulatory Visit (HOSPITAL_BASED_OUTPATIENT_CLINIC_OR_DEPARTMENT_OTHER): Payer: Self-pay

## 2023-02-28 ENCOUNTER — Other Ambulatory Visit: Payer: Self-pay | Admitting: Cardiology

## 2023-02-28 ENCOUNTER — Other Ambulatory Visit (HOSPITAL_BASED_OUTPATIENT_CLINIC_OR_DEPARTMENT_OTHER): Payer: Self-pay

## 2023-02-28 MED ORDER — WEGOVY 2.4 MG/0.75ML ~~LOC~~ SOAJ
2.4000 mg | SUBCUTANEOUS | 5 refills | Status: DC
  Filled 2023-02-28: qty 3, 28d supply, fill #0
  Filled 2023-04-01: qty 3, 28d supply, fill #1
  Filled 2023-04-25: qty 3, 28d supply, fill #2
  Filled 2023-05-29: qty 3, 28d supply, fill #3
  Filled 2023-06-20: qty 3, 28d supply, fill #4
  Filled 2023-07-15: qty 3, 28d supply, fill #5

## 2023-03-07 ENCOUNTER — Encounter: Payer: Self-pay | Admitting: Family

## 2023-03-11 ENCOUNTER — Ambulatory Visit (INDEPENDENT_AMBULATORY_CARE_PROVIDER_SITE_OTHER): Payer: Federal, State, Local not specified - PPO | Admitting: Acute Care

## 2023-03-11 DIAGNOSIS — Z87891 Personal history of nicotine dependence: Secondary | ICD-10-CM | POA: Diagnosis not present

## 2023-03-11 NOTE — Progress Notes (Addendum)
 Provider Attestation I agree with the documentation of the Shared Decision Making visit,  smoking cessation counseling if appropriate, and verification or eligibility for lung cancer screening as documented by the RN Nurse Navigator.   Raejean Bullock, MSN, AGACNP-BC [place] [facility] See Amion for personal pager PCCM on call pager [phone]     Virtual Visit via Telephone Note  I connected with Drew [name] on 03/11/23 at  9:00 AM EST by telephone and verified that I am speaking with the correct person using two identifiers.  Location: Patient: Drew [name] Provider: Alyse Bach, RN   I discussed the limitations, risks, security and privacy concerns of performing an evaluation and management service by telephone and the availability of in person appointments. I also discussed with the patient that there may be a patient responsible charge related to this service. The patient expressed understanding and agreed to proceed.   Shared Decision Making Visit Lung Cancer Screening Program [phone])   Eligibility: Age 63 y.o. Pack Years Smoking History Calculation 37 (# packs/per year x # years smoked) Recent History of coughing up blood  no Unexplained weight loss? no ( >Than 15 pounds within the last 6 months ) Prior History Lung / other cancer no (Diagnosis within the last 5 years already requiring surveillance chest CT Scans). Smoking Status Former Smoker Former Smokers: Years since quit: 5 years  Quit Date: 2020  Visit Components: Discussion included one or more decision making aids. yes Discussion included risk/benefits of screening. yes Discussion included potential follow up diagnostic testing for abnormal scans. yes Discussion included meaning and risk of over diagnosis. yes Discussion included meaning and risk of False Positives. yes Discussion included meaning of total radiation exposure. yes  Counseling Included: Importance of  adherence to annual lung cancer LDCT screening. yes Impact of comorbidities on ability to participate in the program. yes Ability and willingness to under diagnostic treatment. yes  Smoking Cessation Counseling: Current Smokers:  Discussed importance of smoking cessation. yes Information about tobacco cessation classes and interventions provided to patient. yes Patient provided with "ticket" for LDCT Scan. no Symptomatic Patient. no  Counseling(Intermediate counseling: > three minutes) 99406 Diagnosis Code: Tobacco Use Z72.0 Asymptomatic Patient yes  Counseling (Intermediate counseling: > three minutes counseling) [id number] Former Smokers:  Discussed the importance of maintaining cigarette abstinence. yes Diagnosis Code: Personal History of Nicotine Dependence. G95.621 Information about tobacco cessation classes and interventions provided to patient. Yes Patient provided with "ticket" for LDCT Scan. no Written Order for Lung Cancer Screening with LDCT placed in Epic. Yes (CT Chest Lung Cancer Screening Low Dose W/O CM) [id number] Z12.2-Screening of respiratory organs Z87.891-Personal history of nicotine dependence   Alyse Bach, RN

## 2023-03-11 NOTE — Patient Instructions (Signed)

## 2023-03-12 ENCOUNTER — Ambulatory Visit
Admission: RE | Admit: 2023-03-12 | Discharge: 2023-03-12 | Disposition: A | Discharge status: Home / Self Care | Payer: Medicare Other | Source: Ambulatory Visit | Attending: Acute Care | Admitting: Acute Care

## 2023-03-12 DIAGNOSIS — Z87891 Personal history of nicotine dependence: Secondary | ICD-10-CM

## 2023-03-12 DIAGNOSIS — Z122 Encounter for screening for malignant neoplasm of respiratory organs: Secondary | ICD-10-CM

## 2023-04-01 ENCOUNTER — Other Ambulatory Visit (HOSPITAL_BASED_OUTPATIENT_CLINIC_OR_DEPARTMENT_OTHER): Payer: Self-pay

## 2023-04-08 ENCOUNTER — Other Ambulatory Visit: Payer: Self-pay

## 2023-04-08 DIAGNOSIS — Z122 Encounter for screening for malignant neoplasm of respiratory organs: Secondary | ICD-10-CM

## 2023-04-08 DIAGNOSIS — Z87891 Personal history of nicotine dependence: Secondary | ICD-10-CM

## 2023-04-09 ENCOUNTER — Telehealth: Payer: Self-pay | Admitting: Medical

## 2023-04-09 ENCOUNTER — Other Ambulatory Visit: Payer: Self-pay

## 2023-04-09 MED ORDER — ATORVASTATIN CALCIUM 10 MG PO TABS
10.0000 mg | ORAL_TABLET | Freq: Every day | ORAL | 3 refills | Status: DC
  Filled 2023-04-09: qty 90, 90d supply, fill #0

## 2023-04-09 MED ORDER — ROSUVASTATIN CALCIUM 20 MG PO TABS
20.0000 mg | ORAL_TABLET | Freq: Every day | ORAL | 3 refills | Status: AC
  Filled 2023-04-09: qty 90, 90d supply, fill #0
  Filled 2023-07-15: qty 90, 90d supply, fill #1
  Filled 2023-09-05 – 2023-12-19 (×2): qty 90, 90d supply, fill #2

## 2023-04-09 NOTE — Addendum Note (Signed)
 Addended by: Gwenevere Abbot on: 04/09/2023 07:17 PM   Modules accepted: Orders

## 2023-04-09 NOTE — Telephone Encounter (Signed)
 CT lung cancer screening comments below on cardiovascular system. So rx'd atrovastatin and want him to start. Follow up with me in 2 weeks. Schedule early morning and come in fasting. Will get lipid panel on that day. Please notify pt.   Cardiovascular: The heart size is mildly enlarged. No significant aortic atherosclerotic calcifications. Lad coronary artery calcification.

## 2023-04-09 NOTE — Telephone Encounter (Signed)
 Sent in crestor rather than atrovastatin.

## 2023-04-10 ENCOUNTER — Other Ambulatory Visit (HOSPITAL_BASED_OUTPATIENT_CLINIC_OR_DEPARTMENT_OTHER): Payer: Self-pay

## 2023-04-10 ENCOUNTER — Other Ambulatory Visit: Payer: Self-pay

## 2023-04-10 NOTE — Telephone Encounter (Signed)
 Appt scheduled

## 2023-04-15 ENCOUNTER — Ambulatory Visit (INDEPENDENT_AMBULATORY_CARE_PROVIDER_SITE_OTHER): Admitting: Medical

## 2023-04-15 ENCOUNTER — Other Ambulatory Visit (HOSPITAL_BASED_OUTPATIENT_CLINIC_OR_DEPARTMENT_OTHER): Payer: Self-pay

## 2023-04-15 VITALS — BP 120/70 | HR 61 | Resp 18 | Ht 71.0 in | Wt 276.0 lb

## 2023-04-15 DIAGNOSIS — I2510 Atherosclerotic heart disease of native coronary artery without angina pectoris: Secondary | ICD-10-CM

## 2023-04-15 DIAGNOSIS — I5022 Chronic systolic (congestive) heart failure: Secondary | ICD-10-CM | POA: Diagnosis not present

## 2023-04-15 DIAGNOSIS — E669 Obesity, unspecified: Secondary | ICD-10-CM | POA: Diagnosis not present

## 2023-04-15 DIAGNOSIS — J309 Allergic rhinitis, unspecified: Secondary | ICD-10-CM | POA: Diagnosis not present

## 2023-04-15 NOTE — Progress Notes (Signed)
 Subjective:    Patient ID: Drew [name], male    DOB: 1960-12-21, 63 y.o.   MRN: [id number]  HPI   Discussed the use of AI scribe software for clinical note transcription with the patient, who gave verbal consent to proceed.  History of Present Illness   Drew [name] is a 63 year old male with coronary artery calcification who presents for follow-up after a CT scan.  He underwent a CT scan for lung cancer screening due to his history of smoking, which he quit in May 2020. The scan revealed coronary artery calcifications and emphysema-type changes. No chest pain or shortness of breath.   Coronary artery calcification was incidentally found on the CT scan, specifically in the left anterior descending artery. He is asymptomatic with no chest pain or shortness of breath. He is on Crestor 20 mg daily for cholesterol management, although he was initially unaware of this medication.  He has a history of congestive heart failure and is under the care of a cardiologist, whom he last saw during a hospitalization in 2022. He is on multiple medications for CHF, including Coreg, Eliquis, Entresto, spironolactone, and torsemide. Will try to schedule follow. Clinically stable.  He reports a significant weight loss, currently weighing 276 pounds, down from 385 pounds, which he attributes to the use of Wegovy. He denies experiencing any side effects from the medication, such as nausea, vomiting, or stomach pain, except for occasional urgency to use the bathroom.  He has a history of hypothyroidism and is on levothyroxine, with recent thyroid function tests showing normal results.  He also has a history of anemia and receives annual iron infusions under the care of a hematologist.  He quit smoking in May 2020 and has no plans to resume.    He uses Afrin nasal spray once a day for two days during allergy season but avoids prolonged use due to concerns about rebound congestion and loss of smell. I  advised against afrin use. Symptoms flare due to pollen.        Past Medical History:  Diagnosis Date   Acute CHF (congestive heart failure) (HCC) 05/24/2018   Acute on chronic combined systolic and diastolic CHF (congestive heart failure) (HCC)    Acute systolic HF (heart failure) (HCC) 06/02/2018   AKI (acute kidney injury) (HCC) 03/03/2020   Alcohol use 05/24/2018   Allergy    Chronic systolic CHF (congestive heart failure) (HCC) 03/03/2020   Dilated cardiomyopathy (HCC)    Essential hypertension 05/24/2018   HFrEF (heart failure with reduced ejection fraction) (HCC) 11/20/2019   HTN (hypertension)    Hyperlipidemia 05/24/2018   Hypokalemia 03/03/2020   IDA (iron deficiency anemia) 08/23/2020   Knee pain, left 02/11/2014   New onset of congestive heart failure (HCC) 05/23/2018   Nonischemic cardiomyopathy (HCC) 11/20/2019   Obesity, Class III, BMI 40-49.9 (morbid obesity) (HCC) 05/24/2018   Pulmonary embolism (HCC) 03/03/2020   Seasonal allergies 02/11/2014   Tobacco abuse 05/24/2018   Typical atrial flutter (HCC)    Wellness examination 02/26/2014     Social History   Socioeconomic History   Marital status: Married    Spouse name: Not on file   Number of children: Not on file   Years of education: Not on file   Highest education level: Not on file  Occupational History   Not on file  Tobacco Use   Smoking status: Former    Current packs/day: 0.00    Average packs/day: 1 pack/day for  38.3 years (38.3 ttl pk-yrs)    Types: Cigarettes    Start date: 89    Quit date: 05/23/2018    Years since quitting: 4.8   Smokeless tobacco: Never  Vaping Use   Vaping status: Never Used  Substance and Sexual Activity   Alcohol use: Yes    Alcohol/week: 0.0 standard drinks of alcohol    Comment: 6 pack a week spread out over weekend   Drug use: No   Sexual activity: Yes  Other Topics Concern   Not on file  Social History Narrative   [employer].   Married lives with wife, mother in law and daughter.  7  children.     Social Drivers of Health   Financial Resource Strain: High Risk (09/07/2020)   Overall Financial Resource Strain (CARDIA)    Difficulty of Paying Living Expenses: Hard  Food Insecurity: No Food Insecurity (09/07/2020)   Hunger Vital Sign    Worried About Running Out of Food in the Last Year: Never true    Ran Out of Food in the Last Year: Never true  Transportation Needs: No Transportation Needs (09/07/2020)   PRAPARE - Administrator, Civil Service (Medical): No    Lack of Transportation (Non-Medical): No  Physical Activity: Unknown (05/24/2018)   Exercise Vital Sign    Days of Exercise per Week: Patient declined    Minutes of Exercise per Session: Patient declined  Stress: Not on file  Social Connections: Unknown (05/24/2018)   Social Connection and Isolation Panel [NHANES]    Frequency of Communication with Friends and Family: Patient declined    Frequency of Social Gatherings with Friends and Family: Patient declined    Attends Religious Services: Patient declined    Active Member of Clubs or Organizations: Patient declined    Attends Banker Meetings: Patient declined    Marital Status: Patient declined  Intimate Partner Violence: Unknown (05/24/2018)   Humiliation, Afraid, Rape, and Kick questionnaire    Fear of Current or Ex-Partner: Patient declined    Emotionally Abused: Patient declined    Physically Abused: Patient declined    Sexually Abused: Patient declined    Past Surgical History:  Procedure Laterality Date   CARDIOVERSION N/A 03/07/2020   Procedure: CARDIOVERSION;  Surgeon: Laurey Morale, MD;  Location: [facility] ENDOSCOPY;  Service: Cardiovascular;  Laterality: N/A;   KNEE ARTHROSCOPY  2009   RIGHT/LEFT HEART CATH AND CORONARY ANGIOGRAPHY N/A 05/26/2018   Procedure: RIGHT/LEFT HEART CATH AND CORONARY ANGIOGRAPHY;  Surgeon: Lyn Records, MD;  Location: [facility] INVASIVE CV LAB;  Service: Cardiovascular;  Laterality: N/A;   TEE WITHOUT  CARDIOVERSION N/A 03/07/2020   Procedure: TRANSESOPHAGEAL ECHOCARDIOGRAM (TEE);  Surgeon: Laurey Morale, MD;  Location: [facility] ENDOSCOPY;  Service: Cardiovascular;  Laterality: N/A;   THROAT SURGERY  2005    Family History  Problem Relation Age of Onset   Hearing loss Mother    Cancer Father    Heart failure Other    Heart failure Paternal Grandfather     Allergies  Allergen Reactions   Tetracyclines & Related Hives and Itching    Current Outpatient Medications on File Prior to Visit  Medication Sig Dispense Refill   albuterol (VENTOLIN HFA) 108 (90 Base) MCG/ACT inhaler Inhale 2 puffs into the lungs every 6 (six) hours as needed for wheezing or shortness of breath. 6.7 g 2   allopurinol (ZYLOPRIM) 100 MG tablet Take 1 tablet (100 mg total) by mouth daily. 30 tablet 0  apixaban (ELIQUIS) 5 MG TABS tablet Take 1 tablet (5 mg total) by mouth 2 (two) times daily. Needs Cardiology appt for Eliquis Refills, please call office to schedule.  Thank you 60 tablet 0   carvedilol (COREG) 12.5 MG tablet Take 1.5 tablets (18.75 mg total) by mouth 2 (two) times daily with a meal. *NEED FOLLOW UP APPOINTMENT FOR MORE REFILLS.* 180 tablet 0   dapagliflozin propanediol (FARXIGA) 10 MG TABS tablet Take 1 tablet (10 mg total) by mouth daily. 90 tablet 3   digoxin (LANOXIN) 0.125 MG tablet Take 1 tablet (0.125 mg total) by mouth daily. *NEED FOLLOW UP APPOINTMENT FOR MORE REFILLS.* 90 tablet 0   levothyroxine (SYNTHROID) 25 MCG tablet Take 1 tablet (25 mcg total) by mouth daily before breakfast. *Need appointment for future refills.* 30 tablet 0   melatonin 3 MG TABS tablet Take 1 tablet (3 mg total) by mouth at bedtime. (Patient taking differently: Take 3 mg by mouth at bedtime. As needed) 90 tablet 3   potassium chloride SA (KLOR-CON M) 20 MEQ tablet Take 2 tablets (40 mEq total) by mouth daily. 60 tablet 1   rosuvastatin (CRESTOR) 20 MG tablet Take 1 tablet (20 mg total) by mouth daily. 90 tablet 3    sacubitril-valsartan (ENTRESTO) 97-103 MG Take 1 tablet by mouth 2 (two) times daily. 180 tablet 2   spironolactone (ALDACTONE) 25 MG tablet Take 1 tablet (25 mg total) by mouth daily.*NEED FOLLOW UP APPOINTMENT FOR MORE REFILLS.* 90 tablet 0   torsemide (DEMADEX) 20 MG tablet Take 4 tablets (80 mg total) by mouth daily. *Need appointment.* 60 tablet 0   WEGOVY 2.4 MG/0.75ML SOAJ Inject 2.4 mg into the skin once a week. 3 mL 5   No current facility-administered medications on file prior to visit.    BP 120/70 Comment: espac  Pulse 61   Resp 18   Ht 5\' 11"  (1.803 m)   Wt 276 lb (125.2 kg)   SpO2 95%   BMI 38.49 kg/m      Review of Systems  Constitutional:  Negative for chills, fatigue and fever.  HENT:  Positive for congestion. Negative for ear pain, hearing loss, postnasal drip, sneezing and sore throat.        Seasonal allergies with pollen.  Respiratory:  Negative for chest tightness, shortness of breath and wheezing.   Cardiovascular:  Negative for chest pain and palpitations.  Gastrointestinal:  Negative for abdominal pain, constipation and diarrhea.  Genitourinary:  Negative for dysuria, genital sores and penile swelling.  Neurological:  Negative for dizziness, weakness and light-headedness.  Hematological:  Negative for adenopathy. Does not bruise/bleed easily.         Objective:   Physical Exam   General Mental Status- Alert. General Appearance- Not in acute distress.   Skin General: Color- Normal Color. Moisture- Normal Moisture.  Neck Carotid Arteries- Normal color. Moisture- Normal Moisture. No carotid bruits. No JVD.  Chest and Lung Exam Auscultation: Breath Sounds:-Normal.  Cardiovascular Auscultation:Rythm- Regular. Murmurs & Other Heart Sounds:Auscultation of the heart reveals- No Murmurs.  Abdomen Inspection:-Inspeection Normal. Palpation/Percussion:Note:No mass. Palpation and Percussion of the abdomen reveal- Non Tender, Non Distended + BS, no  rebound or guarding.   Neurologic Cranial Nerve exam:- CN III-XII intact(No nystagmus), symmetric smile. Strength:- 5/5 equal and symmetric strength both upper and lower extremities.      Assessment & Plan:   Assessment and Plan    Coronary Artery Calcification Coronary artery calcification was incidentally found on a CT scan  performed for lung cancer screening due to a history of smoking. The calcification is located in the left anterior descending artery. He is asymptomatic with no chest pain or dyspnea. He quit smoking in May 2020 and  Crestor  prsscribed for cholesterol management. - Crestor 20 mg daily - Notify cardiologist about the calcification - Advise him to make an appointment with cardiologist. follow for chf as well.  - Advise him to avoid high cholesterol foods  Congestive Heart Failure (CHF). Well controlled. He has CHF and is under the care of a cardiologist, Dr. Shirlee Latch. He reports significant improvement in symptoms and is currently on a regimen of Coreg, Eliquis, Entresto, spironolactone, and torsemide. No pedal edema. no Dyspnea. - Continue current CHF medications: Coreg, Eliquis, Entresto, spironolactone, and torsemide - Advise him to follow up with cardiologist  Obesity He has experienced significant weight loss from 360 lbs to 276 lbs, attributed to the use of Wegovy. He reports no significant side effects from the medication and is pleased with the weight loss. - Continue [phone] - Encourage continued healthy eating habits  Hypothyroidism He is on levothyroxine for hypothyroidism. Recent TSH and T4 levels were within normal range. - Continue levothyroxine  Anemia He has anemia and is under the care of a hematologist. He receives annual iron infusions and is being monitored by the hematologist. - Continue annual iron infusions as per hematologist's guidance  Allergic Rhinitis He is advised to manage allergies with Flonase and saline nasal spray. - Use  Flonase nasal spray in the morning for allergies - Use saline nasal spray for nasal irrigation - Advise wearing a mask outdoors during high pollen days   Follow up in 6 months or sooner if needed        [employer] [employer]

## 2023-04-15 NOTE — Patient Instructions (Signed)
 Coronary Artery Calcification Coronary artery calcification was incidentally found on a CT scan performed for lung cancer screening due to a history of smoking. The calcification is located in the left anterior descending artery. He is asymptomatic with no chest pain or dyspnea. He quit smoking in May 2020 and  Crestor  prsscribed for cholesterol management. - Crestor 20 mg daily - Notify cardiologist about the calcification - Advise him to make an appointment with cardiologist. follow for chf as well.  - Advise him to avoid high cholesterol foods  Congestive Heart Failure (CHF). Well controlled. He has CHF and is under the care of a cardiologist, Dr. Shirlee Latch. He reports significant improvement in symptoms and is currently on a regimen of Coreg, Eliquis, Entresto, spironolactone, and torsemide. No pedal edema. no Dyspnea. - Continue current CHF medications: Coreg, Eliquis, Entresto, spironolactone, and torsemide - Advise him to follow up with cardiologist  Obesity He has experienced significant weight loss from 360 lbs to 276 lbs, attributed to the use of Wegovy. He reports no significant side effects from the medication and is pleased with the weight loss. - Continue [phone] - Encourage continued healthy eating habits  Hypothyroidism He is on levothyroxine for hypothyroidism. Recent TSH and T4 levels were within normal range. - Continue levothyroxine  Anemia He has anemia and is under the care of a hematologist. He receives annual iron infusions and is being monitored by the hematologist. - Continue annual iron infusions as per hematologist's guidance  Allergic Rhinitis He is advised to manage allergies with Flonase and saline nasal spray. - Use Flonase nasal spray in the morning for allergies - Use saline nasal spray for nasal irrigation - Advise wearing a mask outdoors during high pollen days   Follow up in 6 months or sooner if needed

## 2023-04-25 ENCOUNTER — Ambulatory Visit

## 2023-04-25 DIAGNOSIS — Z0000 Encounter for general adult medical examination without abnormal findings: Secondary | ICD-10-CM

## 2023-04-25 NOTE — Progress Notes (Signed)
 Subjective:   Drew [name] is a 63 y.o. who presents for a Medicare Wellness preventive visit.  Visit Complete: Virtual I connected with  Gildardo Pounds on 04/25/23 by a audio enabled telemedicine application and verified that I am speaking with the correct person using two identifiers.  Patient Location: Home  Provider Location: Home Office  I discussed the limitations of evaluation and management by telemedicine. The patient expressed understanding and agreed to proceed.  Vital Signs: Because this visit was a virtual/telehealth visit, some criteria may be missing or patient reported. Any vitals not documented were not able to be obtained and vitals that have been documented are patient reported.  VideoError- Librarian, academic were attempted between this provider and patient, however failed, due to patient having technical difficulties OR patient did not have access to video capability.  We continued and completed visit with audio only.   Persons Participating in Visit: Patient.  AWV Questionnaire: No: Patient Medicare AWV questionnaire was not completed prior to this visit.  Cardiac Risk Factors include: advanced age (>30men, >64 women);dyslipidemia;hypertension;male gender     Objective:    Today's Vitals   04/25/23 1013  PainSc: 6    There is no height or weight on file to calculate BMI.     04/25/2023   10:23 AM 12/24/2022   10:58 AM 08/24/2022   10:24 AM 05/18/2022    9:33 AM 02/23/2022   10:32 AM 08/22/2021   10:54 AM 02/22/2021   11:39 AM  Advanced Directives  Does Patient Have a Medical Advance Directive? No No No No No No No  Would patient like information on creating a medical advance directive?  No - Patient declined No - Patient declined No - Patient declined No - Patient declined No - Patient declined No - Patient declined    Current Medications (verified) Outpatient Encounter Medications as of 04/25/2023  Medication Sig    albuterol (VENTOLIN HFA) 108 (90 Base) MCG/ACT inhaler Inhale 2 puffs into the lungs every 6 (six) hours as needed for wheezing or shortness of breath.   allopurinol (ZYLOPRIM) 100 MG tablet Take 1 tablet (100 mg total) by mouth daily.   apixaban (ELIQUIS) 5 MG TABS tablet Take 1 tablet (5 mg total) by mouth 2 (two) times daily. Needs Cardiology appt for Eliquis Refills, please call office to schedule.  Thank you   carvedilol (COREG) 12.5 MG tablet Take 1.5 tablets (18.75 mg total) by mouth 2 (two) times daily with a meal. *NEED FOLLOW UP APPOINTMENT FOR MORE REFILLS.*   dapagliflozin propanediol (FARXIGA) 10 MG TABS tablet Take 1 tablet (10 mg total) by mouth daily.   digoxin (LANOXIN) 0.125 MG tablet Take 1 tablet (0.125 mg total) by mouth daily. *NEED FOLLOW UP APPOINTMENT FOR MORE REFILLS.*   levothyroxine (SYNTHROID) 25 MCG tablet Take 1 tablet (25 mcg total) by mouth daily before breakfast. *Need appointment for future refills.*   melatonin 3 MG TABS tablet Take 1 tablet (3 mg total) by mouth at bedtime. (Patient taking differently: Take 3 mg by mouth at bedtime. As needed)   potassium chloride SA (KLOR-CON M) 20 MEQ tablet Take 2 tablets (40 mEq total) by mouth daily.   rosuvastatin (CRESTOR) 20 MG tablet Take 1 tablet (20 mg total) by mouth daily.   sacubitril-valsartan (ENTRESTO) 97-103 MG Take 1 tablet by mouth 2 (two) times daily.   spironolactone (ALDACTONE) 25 MG tablet Take 1 tablet (25 mg total) by mouth daily.*NEED FOLLOW UP APPOINTMENT FOR  MORE REFILLS.*   torsemide (DEMADEX) 20 MG tablet Take 4 tablets (80 mg total) by mouth daily. *Need appointment.*   WEGOVY 2.4 MG/0.75ML SOAJ Inject 2.4 mg into the skin once a week.   No facility-administered encounter medications on file as of 04/25/2023.    Allergies (verified) Tetracyclines & related   History: Past Medical History:  Diagnosis Date   Acute CHF (congestive heart failure) (HCC) 05/24/2018   Acute on chronic combined  systolic and diastolic CHF (congestive heart failure) (HCC)    Acute systolic HF (heart failure) (HCC) 06/02/2018   AKI (acute kidney injury) (HCC) 03/03/2020   Alcohol use 05/24/2018   Allergy    Chronic systolic CHF (congestive heart failure) (HCC) 03/03/2020   Dilated cardiomyopathy (HCC)    Essential hypertension 05/24/2018   HFrEF (heart failure with reduced ejection fraction) (HCC) 11/20/2019   HTN (hypertension)    Hyperlipidemia 05/24/2018   Hypokalemia 03/03/2020   IDA (iron deficiency anemia) 08/23/2020   Knee pain, left 02/11/2014   New onset of congestive heart failure (HCC) 05/23/2018   Nonischemic cardiomyopathy (HCC) 11/20/2019   Obesity, Class III, BMI 40-49.9 (morbid obesity) (HCC) 05/24/2018   Pulmonary embolism (HCC) 03/03/2020   Seasonal allergies 02/11/2014   Tobacco abuse 05/24/2018   Typical atrial flutter (HCC)    Wellness examination 02/26/2014   Past Surgical History:  Procedure Laterality Date   CARDIOVERSION N/A 03/07/2020   Procedure: CARDIOVERSION;  Surgeon: Laurey Morale, MD;  Location: [facility] ENDOSCOPY;  Service: Cardiovascular;  Laterality: N/A;   KNEE ARTHROSCOPY  2009   RIGHT/LEFT HEART CATH AND CORONARY ANGIOGRAPHY N/A 05/26/2018   Procedure: RIGHT/LEFT HEART CATH AND CORONARY ANGIOGRAPHY;  Surgeon: Lyn Records, MD;  Location: [facility] INVASIVE CV LAB;  Service: Cardiovascular;  Laterality: N/A;   TEE WITHOUT CARDIOVERSION N/A 03/07/2020   Procedure: TRANSESOPHAGEAL ECHOCARDIOGRAM (TEE);  Surgeon: Laurey Morale, MD;  Location: [facility] ENDOSCOPY;  Service: Cardiovascular;  Laterality: N/A;   THROAT SURGERY  2005   Family History  Problem Relation Age of Onset   Hearing loss Mother    Cancer Father    Heart failure Other    Heart failure Paternal Grandfather    Social History   Socioeconomic History   Marital status: Married    Spouse name: Not on file   Number of children: Not on file   Years of education: Not on file   Highest education level: Not on file   Occupational History   Not on file  Tobacco Use   Smoking status: Former    Current packs/day: 0.00    Average packs/day: 1 pack/day for 38.3 years (38.3 ttl pk-yrs)    Types: Cigarettes    Start date: 68    Quit date: 05/23/2018    Years since quitting: 4.9   Smokeless tobacco: Never  Vaping Use   Vaping status: Never Used  Substance and Sexual Activity   Alcohol use: Yes    Alcohol/week: 0.0 standard drinks of alcohol    Comment: 6 pack a week spread out over weekend   Drug use: No   Sexual activity: Yes  Other Topics Concern   Not on file  Social History Narrative   [employer].   Married lives with wife, mother in law and daughter.  7 children.     Social Drivers of Corporate investment banker Strain: Low Risk  (04/25/2023)   Overall Financial Resource Strain (CARDIA)    Difficulty of Paying Living Expenses: Not hard at all  Food Insecurity: No Food Insecurity (04/25/2023)   Hunger Vital Sign    Worried About Running Out of Food in the Last Year: Never true    Ran Out of Food in the Last Year: Never true  Transportation Needs: No Transportation Needs (04/25/2023)   PRAPARE - Administrator, Civil Service (Medical): No    Lack of Transportation (Non-Medical): No  Physical Activity: Inactive (04/25/2023)   Exercise Vital Sign    Days of Exercise per Week: 0 days    Minutes of Exercise per Session: 0 min  Stress: No Stress Concern Present (04/25/2023)   [employer] of Occupational Health - Occupational Stress Questionnaire    Feeling of Stress : Not at all  Social Connections: Moderately Isolated (04/25/2023)   Social Connection and Isolation Panel [NHANES]    Frequency of Communication with Friends and Family: More than three times a week    Frequency of Social Gatherings with Friends and Family: More than three times a week    Attends Religious Services: Never    Database administrator or Organizations: No    Attends Engineer, structural: Never     Marital Status: Married    Tobacco Counseling Counseling given: Not Answered    Clinical Intake:  Pre-visit preparation completed: Yes  Pain : 0-10 Pain Score: 6  Pain Type: Chronic pain Pain Location: Knee Pain Orientation: Left, Right Pain Descriptors / Indicators: Aching Pain Onset: More than a month ago Pain Frequency: Constant     Nutritional Risks: Nausea/ vomitting/ diarrhea (diarrhea due to medication) Diabetes: No  Lab Results  Component Value Date   HGBA1C 5.5 03/07/2021   HGBA1C 6.5 04/28/2020   HGBA1C 5.3 05/26/2018     How often do you need to have someone help you when you read instructions, pamphlets, or other written materials from your doctor or pharmacy?: 1 - Never  Interpreter Needed?: No  Information entered by :: NAllen LPN   Activities of Daily Living     04/25/2023   10:14 AM  In your present state of health, do you have any difficulty performing the following activities:  Hearing? 0  Vision? 0  Difficulty concentrating or making decisions? 0  Walking or climbing stairs? 1  Comment due to knees  Dressing or bathing? 0  Doing errands, shopping? 0  Preparing Food and eating ? N  Using the Toilet? N  In the past six months, have you accidently leaked urine? N  Do you have problems with loss of bowel control? N  Managing your Medications? N  Managing your Finances? N  Housekeeping or managing your Housekeeping? N    Patient Care Team: Saguier, Kateri Mc as PCP - General (Physician Assistant) Thomasene Ripple, DO as PCP - Cardiology (Cardiology)  Indicate any recent Medical Services you may have received from other than [facility] providers in the past year (date may be approximate).     Assessment:   This is a routine wellness examination for [place].  Hearing/Vision screen Hearing Screening - Comments:: Denies hearing issues Vision Screening - Comments:: Regular eye exams, [facility] in [place]   Goals Addressed              This Visit's Progress    Patient Stated       04/25/2023, getting knees together to decrease pain       Depression Screen     04/25/2023   10:26 AM 01/02/2023    8:41 AM 11/20/2021  9:54 AM 01/28/2019    3:00 PM  PHQ 2/9 Scores  PHQ - 2 Score 0 0 0 0  PHQ- 9 Score 3   3    Fall Risk     04/25/2023   10:25 AM 11/20/2021    9:54 AM  Fall Risk   Falls in the past year? 1 0  Comment tripped   Number falls in past yr: 0 0  Injury with Fall? 0 0  Risk for fall due to : Medication side effect No Fall Risks  Follow up Falls prevention discussed;Falls evaluation completed Falls evaluation completed    MEDICARE RISK AT HOME:  Medicare Risk at Home Any stairs in or around the home?: Yes If so, are there any without handrails?: No Home free of loose throw rugs in walkways, pet beds, electrical cords, etc?: Yes Adequate lighting in your home to reduce risk of falls?: Yes Life alert?: No Use of a cane, walker or w/c?: No Grab bars in the bathroom?: No Shower chair or bench in shower?: No Elevated toilet seat or a handicapped toilet?: Yes  TIMED UP AND GO:  Was the test performed?  No  Cognitive Function: 6CIT completed        04/25/2023   10:27 AM  6CIT Screen  What Year? 0 points  What month? 0 points  What time? 0 points  Count back from 20 0 points  Months in reverse 0 points  Repeat phrase 0 points  Total Score 0 points    Immunizations Immunization History  Administered Date(s) Administered   PFIZER Comirnaty(Gray Top)Covid-19 Tri-Sucrose Vaccine 06/14/2020   Tdap 02/26/2014    Screening Tests Health Maintenance  Topic Date Due   Hepatitis C Screening  Never done   Pneumococcal Vaccine 60-31 Years old (1 of 2 - PCV) Never done   Zoster Vaccines- Shingrix (1 of 2) Never done   Colonoscopy  Never done   COVID-19 Vaccine (2 - Pfizer risk series) 07/05/2020   INFLUENZA VACCINE  08/16/2023   DTaP/Tdap/Td (2 - Td or Tdap) 02/27/2024   Lung Cancer Screening   03/11/2024   Medicare Annual Wellness (AWV)  04/24/2024   HIV Screening  Completed   HPV VACCINES  Aged Out   Meningococcal B Vaccine  Aged Out    Health Maintenance  Health Maintenance Due  Topic Date Due   Hepatitis C Screening  Never done   Pneumococcal Vaccine 64-22 Years old (1 of 2 - PCV) Never done   Zoster Vaccines- Shingrix (1 of 2) Never done   Colonoscopy  Never done   COVID-19 Vaccine (2 - Pfizer risk series) 07/05/2020   Health Maintenance Items Addressed: Declines vaccines, Spoke to provider about colonoscopy  Additional Screening:  Vision Screening: Recommended annual ophthalmology exams for early detection of glaucoma and other disorders of the eye.  Dental Screening: Recommended annual dental exams for proper oral hygiene  Community Resource Referral / Chronic Care Management: CRR required this visit?  No   CCM required this visit?  No     Plan:     I have personally reviewed and noted the following in the patient's chart:   Medical and social history Use of alcohol, tobacco or illicit drugs  Current medications and supplements including opioid prescriptions. Patient is not currently taking opioid prescriptions. Functional ability and status Nutritional status Physical activity Advanced directives List of other physicians Hospitalizations, surgeries, and ER visits in previous 12 months Vitals Screenings to include cognitive, depression, and falls Referrals  and appointments  In addition, I have reviewed and discussed with patient certain preventive protocols, quality metrics, and best practice recommendations. A written personalized care plan for preventive services as well as general preventive health recommendations were provided to patient.     Barb Merino, LPN   1/32/4401   After Visit Summary: (MyChart) Due to this being a telephonic visit, the after visit summary with patients personalized plan was offered to patient via MyChart    Notes: Nothing significant to report at this time.

## 2023-04-25 NOTE — Patient Instructions (Signed)
 Drew [name] , Thank you for taking time to come for your Medicare Wellness Visit. I appreciate your ongoing commitment to your health goals. Please review the following plan we discussed and let me know if I can assist you in the future.   Referrals/Orders/Follow-Ups/Clinician Recommendations: none  This is a list of the screening recommended for you and due dates:  Health Maintenance  Topic Date Due   Hepatitis C Screening  Never done   Pneumococcal Vaccination (1 of 2 - PCV) Never done   Zoster (Shingles) Vaccine (1 of 2) Never done   Colon Cancer Screening  Never done   COVID-19 Vaccine (2 - Pfizer risk series) 07/05/2020   Flu Shot  08/16/2023   DTaP/Tdap/Td vaccine (2 - Td or Tdap) 02/27/2024   Screening for Lung Cancer  03/11/2024   Medicare Annual Wellness Visit  04/24/2024   HIV Screening  Completed   HPV Vaccine  Aged Out   Meningitis B Vaccine  Aged Out    Advanced directives: (ACP Link)Information on Advanced Care Planning can be found at [employer] Advance Health Care Directives Advance Health Care Directives. [email or web address]   Next Medicare Annual Wellness Visit scheduled for next year: Yes  insert Preventive Care attachment Insert FALL PREVENTION attachment if needed

## 2023-04-29 ENCOUNTER — Encounter: Payer: Self-pay | Admitting: Family

## 2023-06-17 ENCOUNTER — Encounter: Payer: Self-pay | Admitting: Acute Care

## 2023-06-24 ENCOUNTER — Encounter: Payer: Self-pay | Admitting: Family

## 2023-06-24 ENCOUNTER — Inpatient Hospital Stay: Payer: Medicare Other | Attending: Hematology & Oncology

## 2023-06-24 ENCOUNTER — Inpatient Hospital Stay (HOSPITAL_BASED_OUTPATIENT_CLINIC_OR_DEPARTMENT_OTHER): Payer: Medicare Other | Admitting: Family

## 2023-06-24 VITALS — BP 95/56 | HR 68 | Temp 98.5°F | Resp 17 | Wt 269.0 lb

## 2023-06-24 DIAGNOSIS — I824Y2 Acute embolism and thrombosis of unspecified deep veins of left proximal lower extremity: Secondary | ICD-10-CM

## 2023-06-24 DIAGNOSIS — I2782 Chronic pulmonary embolism: Secondary | ICD-10-CM | POA: Diagnosis not present

## 2023-06-24 DIAGNOSIS — Z86718 Personal history of other venous thrombosis and embolism: Secondary | ICD-10-CM | POA: Insufficient documentation

## 2023-06-24 DIAGNOSIS — D509 Iron deficiency anemia, unspecified: Secondary | ICD-10-CM | POA: Insufficient documentation

## 2023-06-24 DIAGNOSIS — Z7901 Long term (current) use of anticoagulants: Secondary | ICD-10-CM | POA: Diagnosis not present

## 2023-06-24 DIAGNOSIS — Z86711 Personal history of pulmonary embolism: Secondary | ICD-10-CM | POA: Insufficient documentation

## 2023-06-24 DIAGNOSIS — I2609 Other pulmonary embolism with acute cor pulmonale: Secondary | ICD-10-CM

## 2023-06-24 LAB — CMP (CANCER CENTER ONLY)
ALT: 12 U/L (ref 0–44)
AST: 15 U/L (ref 15–41)
Albumin: 4.6 g/dL (ref 3.5–5.0)
Alkaline Phosphatase: 43 U/L (ref 38–126)
Anion gap: 10 (ref 5–15)
BUN: 21 mg/dL — ABNORMAL HIGH (ref 6–20)
CO2: 26 mmol/L (ref 22–32)
Calcium: 9.4 mg/dL (ref 8.9–10.3)
Chloride: 104 mmol/L (ref 98–111)
Creatinine: 1.22 mg/dL (ref 0.61–1.24)
GFR, Estimated: >60 mL/min (ref >60)
Glucose, Bld: 101 mg/dL — ABNORMAL HIGH (ref 70–99)
Potassium: 3.7 mmol/L (ref 3.5–5.1)
Sodium: 140 mmol/L (ref 135–145)
Total Bilirubin: 0.9 mg/dL (ref 0.0–1.2)
Total Protein: 7.7 g/dL (ref 6.5–8.1)

## 2023-06-24 LAB — IRON AND IRON BINDING CAPACITY (CC-WL,HP ONLY)
Iron: 89 ug/dL (ref 45–182)
Saturation Ratios: 27 % (ref 17.9–39.5)
TIBC: 336 ug/dL (ref 250–450)
UIBC: 247 ug/dL (ref 117–376)

## 2023-06-24 LAB — CBC
HCT: 40.4 % (ref 39.0–52.0)
Hemoglobin: 14 g/dL (ref 13.0–17.0)
MCH: 32.8 pg (ref 26.0–34.0)
MCHC: 34.7 g/dL (ref 30.0–36.0)
MCV: 94.6 fL (ref 80.0–100.0)
Platelets: 292 10*3/uL (ref 150–400)
RBC: 4.27 MIL/uL (ref 4.22–5.81)
RDW: 12.4 % (ref 11.5–15.5)
WBC: 5.6 10*3/uL (ref 4.0–10.5)
nRBC: 0 % (ref 0.0–0.2)

## 2023-06-24 LAB — FERRITIN: Ferritin: 231 ng/mL (ref 24–336)

## 2023-06-24 NOTE — Progress Notes (Signed)
 Hematology and Oncology Follow Up Visit  CLAUDE WALDMAN [id number] [birth year] 63 y.o. 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 Stracener is here today for follow-up. He is doing quite well and has no complaints at this time.  He has occasional bright red blood in his stool. History of hemorrhoids per patient. No other blood loss noted.  No petechiae or abnormal bruising.  No fever, chills, n/v, cough, rash, dizziness, SOB, chest pain, palpitations, abdominal pain or changes in bowel or bladder habits.  Swelling in the lower extremities controlled well with Demedex per patient.  No edema noted at this time. Pedal pulses are 1+.  No numbness or tingling in his extremities.  No falls or syncope reported.  Appetite is fair on Wegovy . Hydration is good. Weight is stable at 269 lbs.   ECOG Performance Status: 1 - Symptomatic but completely ambulatory  Medications:  Allergies as of 06/24/2023       Reactions   Tetracyclines & Related Hives, Itching        Medication List        Accurate as of June 24, 2023  9:44 AM. If you have any questions, ask your nurse or doctor.          albuterol  108 (90 Base) MCG/ACT inhaler Commonly known as: VENTOLIN  HFA Inhale 2 puffs into the lungs every 6 (six) hours as needed for wheezing or shortness of breath.   allopurinol  100 MG tablet Commonly known as: ZYLOPRIM  Take 1 tablet (100 mg total) by mouth daily.   carvedilol  12.5 MG tablet Commonly known as: COREG  Take 1.5 tablets (18.75 mg total) by mouth 2 (two) times daily with a meal. *NEED FOLLOW UP APPOINTMENT FOR MORE REFILLS.*   digoxin  0.125 MG tablet Commonly known as: LANOXIN  Take 1 tablet (0.125 mg total) by mouth  daily. *NEED FOLLOW UP APPOINTMENT FOR MORE REFILLS.*   Eliquis  5 MG Tabs tablet Generic drug: apixaban  Take 1 tablet (5 mg total) by mouth 2 (two) times daily. Needs Cardiology appt for Eliquis  Refills, please call office to schedule.  Thank you   Entresto  97-103 MG Generic drug: sacubitril -valsartan  Take 1 tablet by mouth 2 (two) times daily.   Farxiga  10 MG Tabs tablet Generic drug: dapagliflozin  propanediol Take 1 tablet (10 mg total) by mouth daily.   levothyroxine  25 MCG tablet Commonly known as: SYNTHROID  Take 1 tablet (25 mcg total) by mouth daily before breakfast. *Need appointment for future refills.*   melatonin 3 MG Tabs tablet Take 1 tablet (3 mg total) by mouth at bedtime. What changed: additional instructions   potassium chloride  SA 20 MEQ tablet Commonly known as: KLOR-CON  M Take 2 tablets (40 mEq total) by mouth daily.   rosuvastatin  20 MG tablet Commonly known as: Crestor  Take 1 tablet (20 mg total) by mouth daily.   spironolactone  25 MG tablet Commonly known as: ALDACTONE  Take 1 tablet (25 mg total) by mouth daily.*NEED FOLLOW UP APPOINTMENT FOR MORE REFILLS.*   torsemide  20 MG tablet Commonly known as: DEMADEX  Take 4 tablets (80 mg total) by mouth daily. *Need appointment.*   Wegovy  2.4 MG/0.75ML Soaj Generic drug: Semaglutide -Weight Management Inject 2.4 mg  into the skin once a week.        Allergies:  Allergies  Allergen Reactions   Tetracyclines & Related Hives and Itching    Past Medical History, Surgical history, Social history, and Family History were reviewed and updated.  Review of Systems: All other 10 point review of systems is negative.   Physical Exam:  weight is 269 lb 0.6 oz (122 kg). His oral temperature is 98.5 F (36.9 C). His blood pressure is 95/56 (abnormal) and his pulse is 68. His respiration is 17 and oxygen  saturation is 100%.   Wt Readings from Last 3 Encounters:  06/24/23 269 lb 0.6 oz (122 kg)  04/15/23 276  lb (125.2 kg)  02/15/23 285 lb 12.8 oz (129.6 kg)    Ocular: Sclerae unicteric, pupils equal, round and reactive to light Ear-nose-throat: Oropharynx clear, dentition fair Lymphatic: No cervical or supraclavicular adenopathy Lungs no rales or rhonchi, good excursion bilaterally Heart regular rate and rhythm, no murmur appreciated Abd soft, nontender, positive bowel sounds MSK no focal spinal tenderness, no joint edema Neuro: non-focal, well-oriented, appropriate affect Breasts: Deferred   Lab Results  Component Value Date   WBC 5.6 06/24/2023   HGB 14.0 06/24/2023   HCT 40.4 06/24/2023   MCV 94.6 06/24/2023   PLT 292 06/24/2023   Lab Results  Component Value Date   FERRITIN 20 (L) 12/24/2022   IRON 94 12/24/2022   TIBC 375 12/24/2022   UIBC 281 12/24/2022   IRONPCTSAT 25 12/24/2022   Lab Results  Component Value Date   RETICCTPCT 1.9 11/23/2020   RBC 4.27 06/24/2023   No results found for: "KPAFRELGTCHN", "LAMBDASER", "KAPLAMBRATIO" No results found for: "IGGSERUM", "IGA", "IGMSERUM" No results found for: "TOTALPROTELP", "ALBUMINELP", "A1GS", "A2GS", "BETS", "BETA2SER", "GAMS", "MSPIKE", "SPEI"   Chemistry      Component Value Date/Time   NA 140 12/24/2022 0947   NA 146 (H) 02/05/2020 1114   K 3.5 12/24/2022 0947   CL 106 12/24/2022 0947   CO2 24 12/24/2022 0947   BUN 11 12/24/2022 0947   BUN 16 02/05/2020 1114   CREATININE 0.84 12/24/2022 0947   CREATININE 1.26 11/13/2019 1454      Component Value Date/Time   CALCIUM  8.7 (L) 12/24/2022 0947   ALKPHOS 41 12/24/2022 0947   AST 11 (L) 12/24/2022 0947   ALT 7 12/24/2022 0947   BILITOT 0.8 12/24/2022 0947       Impression and Plan:  Mr. Roughton is a very pleasant 63 yo African American gentleman with history of right lower and middle lobe PEs with no evidence of right heart strain and left lower extremity DVT involving the left peroneal veins with atrial flutter (TEE and cardioversion on 03/07/20). DVT and PE  have resolved on follow-up scans in 2022.   He is on lifelong anticoagulation with Eliquis . No changes at this time.  Iron studies are pending. We will replace if needed.  Follow-up in 6 months.   Kennard Pea, NP 6/9/20259:44 AM

## 2023-07-15 ENCOUNTER — Other Ambulatory Visit (HOSPITAL_BASED_OUTPATIENT_CLINIC_OR_DEPARTMENT_OTHER): Payer: Self-pay

## 2023-07-15 ENCOUNTER — Other Ambulatory Visit: Payer: Self-pay

## 2023-08-16 ENCOUNTER — Encounter (HOSPITAL_BASED_OUTPATIENT_CLINIC_OR_DEPARTMENT_OTHER): Payer: Self-pay

## 2023-08-16 ENCOUNTER — Other Ambulatory Visit (HOSPITAL_BASED_OUTPATIENT_CLINIC_OR_DEPARTMENT_OTHER): Payer: Self-pay

## 2023-08-19 ENCOUNTER — Other Ambulatory Visit (HOSPITAL_BASED_OUTPATIENT_CLINIC_OR_DEPARTMENT_OTHER): Payer: Self-pay

## 2023-08-21 ENCOUNTER — Other Ambulatory Visit (HOSPITAL_BASED_OUTPATIENT_CLINIC_OR_DEPARTMENT_OTHER): Payer: Self-pay

## 2023-08-22 ENCOUNTER — Other Ambulatory Visit: Payer: Self-pay | Admitting: Cardiology

## 2023-08-22 ENCOUNTER — Other Ambulatory Visit: Payer: Self-pay

## 2023-08-22 ENCOUNTER — Other Ambulatory Visit (HOSPITAL_BASED_OUTPATIENT_CLINIC_OR_DEPARTMENT_OTHER): Payer: Self-pay

## 2023-08-22 ENCOUNTER — Other Ambulatory Visit (HOSPITAL_COMMUNITY): Payer: Self-pay

## 2023-08-22 MED ORDER — WEGOVY 2.4 MG/0.75ML ~~LOC~~ SOAJ
2.4000 mg | SUBCUTANEOUS | 5 refills | Status: AC
  Filled 2023-08-22 – 2023-09-07 (×4): qty 3, 28d supply, fill #0
  Filled 2023-10-10: qty 3, 28d supply, fill #1
  Filled 2023-11-08: qty 3, 28d supply, fill #2
  Filled 2023-12-05: qty 3, 28d supply, fill #3
  Filled 2024-01-01: qty 3, 28d supply, fill #4
  Filled 2024-01-30: qty 3, 28d supply, fill #5

## 2023-08-23 ENCOUNTER — Telehealth: Payer: Self-pay

## 2023-08-23 ENCOUNTER — Other Ambulatory Visit (HOSPITAL_BASED_OUTPATIENT_CLINIC_OR_DEPARTMENT_OTHER): Payer: Self-pay

## 2023-08-23 ENCOUNTER — Other Ambulatory Visit (HOSPITAL_COMMUNITY): Payer: Self-pay

## 2023-08-23 NOTE — Telephone Encounter (Addendum)
 Pharmacy Patient Advocate Encounter   Received notification from CoverMyMeds that prior authorization for WEGOVY  is required/requested.   Insurance verification completed.   The patient is insured through [facility]   Per test claim: PA required; PA submitted to above mentioned insurance via CoverMyMeds Key/confirmation #/EOC [id number] Status is pending

## 2023-08-23 NOTE — Telephone Encounter (Addendum)
 Pharmacy Patient Advocate Encounter  Received notification from [employer] that Prior Authorization for WEGOVY  has been DENIED.  Full denial letter will be uploaded to the media tab. See denial reason below.  PLAN EXCLUSION

## 2023-08-26 ENCOUNTER — Other Ambulatory Visit (HOSPITAL_BASED_OUTPATIENT_CLINIC_OR_DEPARTMENT_OTHER): Payer: Self-pay

## 2023-08-27 ENCOUNTER — Other Ambulatory Visit (HOSPITAL_BASED_OUTPATIENT_CLINIC_OR_DEPARTMENT_OTHER): Payer: Self-pay

## 2023-08-29 ENCOUNTER — Other Ambulatory Visit: Payer: Self-pay

## 2023-09-05 ENCOUNTER — Other Ambulatory Visit: Payer: Self-pay

## 2023-09-05 ENCOUNTER — Other Ambulatory Visit (HOSPITAL_COMMUNITY): Payer: Self-pay | Admitting: Cardiology

## 2023-09-05 ENCOUNTER — Other Ambulatory Visit: Payer: Self-pay | Admitting: Cardiology

## 2023-09-05 ENCOUNTER — Other Ambulatory Visit: Payer: Self-pay | Admitting: Medical

## 2023-09-05 ENCOUNTER — Other Ambulatory Visit (HOSPITAL_BASED_OUTPATIENT_CLINIC_OR_DEPARTMENT_OTHER): Payer: Self-pay

## 2023-09-05 DIAGNOSIS — I824Y2 Acute embolism and thrombosis of unspecified deep veins of left proximal lower extremity: Secondary | ICD-10-CM

## 2023-09-05 DIAGNOSIS — I2699 Other pulmonary embolism without acute cor pulmonale: Secondary | ICD-10-CM

## 2023-09-05 MED ORDER — APIXABAN 5 MG PO TABS
5.0000 mg | ORAL_TABLET | Freq: Two times a day (BID) | ORAL | 0 refills | Status: DC
  Filled 2023-09-05: qty 60, 30d supply, fill #0

## 2023-09-05 MED ORDER — LEVOTHYROXINE SODIUM 25 MCG PO TABS
25.0000 ug | ORAL_TABLET | Freq: Every day | ORAL | 0 refills | Status: DC
  Filled 2023-09-05: qty 30, 30d supply, fill #0

## 2023-09-05 MED ORDER — CARVEDILOL 12.5 MG PO TABS
18.7500 mg | ORAL_TABLET | Freq: Two times a day (BID) | ORAL | 1 refills | Status: AC
  Filled 2023-09-05: qty 180, 60d supply, fill #0
  Filled 2024-01-01: qty 180, 60d supply, fill #1

## 2023-09-06 ENCOUNTER — Other Ambulatory Visit (HOSPITAL_BASED_OUTPATIENT_CLINIC_OR_DEPARTMENT_OTHER): Payer: Self-pay

## 2023-09-06 MED ORDER — SPIRONOLACTONE 25 MG PO TABS
25.0000 mg | ORAL_TABLET | Freq: Every day | ORAL | 1 refills | Status: AC
  Filled 2023-09-06 – 2024-01-01 (×2): qty 90, 90d supply, fill #0

## 2023-09-07 ENCOUNTER — Other Ambulatory Visit (HOSPITAL_BASED_OUTPATIENT_CLINIC_OR_DEPARTMENT_OTHER): Payer: Self-pay

## 2023-09-07 ENCOUNTER — Other Ambulatory Visit: Payer: Self-pay

## 2023-09-10 ENCOUNTER — Other Ambulatory Visit (HOSPITAL_BASED_OUTPATIENT_CLINIC_OR_DEPARTMENT_OTHER): Payer: Self-pay

## 2023-09-13 ENCOUNTER — Other Ambulatory Visit (HOSPITAL_BASED_OUTPATIENT_CLINIC_OR_DEPARTMENT_OTHER): Payer: Self-pay

## 2023-09-13 ENCOUNTER — Other Ambulatory Visit: Payer: Self-pay

## 2023-09-18 ENCOUNTER — Other Ambulatory Visit (HOSPITAL_BASED_OUTPATIENT_CLINIC_OR_DEPARTMENT_OTHER): Payer: Self-pay

## 2023-09-18 ENCOUNTER — Encounter: Payer: Self-pay | Admitting: Cardiology

## 2023-09-18 NOTE — Telephone Encounter (Signed)
 Rolan Ezra RAMAN, MD to Jerona [place], CMA  [facility] Maritza Burnard JULIANNA, RN  Marcelina Lisa HERO, RN  (Selected Message)     09/18/23  4:37 PM Can we forward this request on to the pharmacy clinic to help him with this? We did not initially put him on Wegovy  but I would like him to be on it if pharmacy clinic could sort this out.

## 2023-09-19 ENCOUNTER — Other Ambulatory Visit (HOSPITAL_COMMUNITY): Payer: Self-pay

## 2023-09-19 ENCOUNTER — Other Ambulatory Visit (HOSPITAL_BASED_OUTPATIENT_CLINIC_OR_DEPARTMENT_OTHER): Payer: Self-pay

## 2023-09-19 ENCOUNTER — Encounter: Payer: Self-pay | Admitting: Family

## 2023-09-20 ENCOUNTER — Telehealth: Payer: Self-pay | Admitting: Pharmacy Technician

## 2023-09-20 ENCOUNTER — Other Ambulatory Visit (HOSPITAL_COMMUNITY): Payer: Self-pay

## 2023-09-20 NOTE — Telephone Encounter (Signed)
   Patient got yesterday for free at [facility]

## 2023-09-23 ENCOUNTER — Other Ambulatory Visit (HOSPITAL_COMMUNITY): Payer: Self-pay

## 2023-10-10 ENCOUNTER — Other Ambulatory Visit (HOSPITAL_BASED_OUTPATIENT_CLINIC_OR_DEPARTMENT_OTHER): Payer: Self-pay

## 2023-10-10 ENCOUNTER — Encounter: Payer: Self-pay | Admitting: Family

## 2023-10-10 ENCOUNTER — Other Ambulatory Visit: Payer: Self-pay | Admitting: Medical

## 2023-10-10 ENCOUNTER — Other Ambulatory Visit: Payer: Self-pay | Admitting: Cardiology

## 2023-10-10 DIAGNOSIS — I2699 Other pulmonary embolism without acute cor pulmonale: Secondary | ICD-10-CM

## 2023-10-10 DIAGNOSIS — I824Y2 Acute embolism and thrombosis of unspecified deep veins of left proximal lower extremity: Secondary | ICD-10-CM

## 2023-10-11 ENCOUNTER — Other Ambulatory Visit (HOSPITAL_BASED_OUTPATIENT_CLINIC_OR_DEPARTMENT_OTHER): Payer: Self-pay

## 2023-10-11 ENCOUNTER — Other Ambulatory Visit: Payer: Self-pay

## 2023-10-11 MED ORDER — LEVOTHYROXINE SODIUM 25 MCG PO TABS
25.0000 ug | ORAL_TABLET | Freq: Every day | ORAL | 0 refills | Status: DC
  Filled 2023-10-11: qty 30, 30d supply, fill #0

## 2023-10-11 MED ORDER — ALLOPURINOL 100 MG PO TABS
100.0000 mg | ORAL_TABLET | Freq: Every day | ORAL | 0 refills | Status: DC
  Filled 2023-10-11: qty 30, 30d supply, fill #0

## 2023-10-24 ENCOUNTER — Other Ambulatory Visit (HOSPITAL_BASED_OUTPATIENT_CLINIC_OR_DEPARTMENT_OTHER): Payer: Self-pay

## 2023-11-22 ENCOUNTER — Other Ambulatory Visit (HOSPITAL_BASED_OUTPATIENT_CLINIC_OR_DEPARTMENT_OTHER): Payer: Self-pay

## 2023-12-05 ENCOUNTER — Other Ambulatory Visit: Payer: Self-pay

## 2023-12-05 ENCOUNTER — Other Ambulatory Visit (HOSPITAL_BASED_OUTPATIENT_CLINIC_OR_DEPARTMENT_OTHER): Payer: Self-pay

## 2023-12-05 ENCOUNTER — Other Ambulatory Visit: Payer: Self-pay | Admitting: Medical

## 2023-12-05 MED ORDER — ALLOPURINOL 100 MG PO TABS
100.0000 mg | ORAL_TABLET | Freq: Every day | ORAL | 0 refills | Status: DC
  Filled 2023-12-05: qty 30, 30d supply, fill #0

## 2023-12-10 ENCOUNTER — Other Ambulatory Visit (HOSPITAL_BASED_OUTPATIENT_CLINIC_OR_DEPARTMENT_OTHER): Payer: Self-pay

## 2023-12-19 ENCOUNTER — Other Ambulatory Visit: Payer: Self-pay | Admitting: Internal Medicine

## 2023-12-19 ENCOUNTER — Other Ambulatory Visit (HOSPITAL_COMMUNITY): Payer: Self-pay | Admitting: Cardiology

## 2023-12-20 ENCOUNTER — Other Ambulatory Visit: Payer: Self-pay

## 2023-12-20 ENCOUNTER — Other Ambulatory Visit (HOSPITAL_BASED_OUTPATIENT_CLINIC_OR_DEPARTMENT_OTHER): Payer: Self-pay

## 2023-12-20 MED ORDER — DAPAGLIFLOZIN PROPANEDIOL 10 MG PO TABS
10.0000 mg | ORAL_TABLET | Freq: Every day | ORAL | 0 refills | Status: AC
  Filled 2023-12-20: qty 60, 60d supply, fill #0

## 2023-12-20 MED ORDER — DIGOXIN 125 MCG PO TABS
0.1250 mg | ORAL_TABLET | Freq: Every day | ORAL | 0 refills | Status: AC
  Filled 2023-12-20: qty 90, 90d supply, fill #0

## 2023-12-23 ENCOUNTER — Encounter: Payer: Self-pay | Admitting: Family

## 2023-12-23 ENCOUNTER — Inpatient Hospital Stay: Attending: Hematology & Oncology

## 2023-12-23 ENCOUNTER — Ambulatory Visit: Admitting: Family

## 2023-12-23 VITALS — BP 132/74 | HR 63 | Temp 97.9°F | Resp 18 | Wt 256.1 lb

## 2023-12-23 DIAGNOSIS — I2609 Other pulmonary embolism with acute cor pulmonale: Secondary | ICD-10-CM

## 2023-12-23 DIAGNOSIS — D509 Iron deficiency anemia, unspecified: Secondary | ICD-10-CM | POA: Insufficient documentation

## 2023-12-23 DIAGNOSIS — I2782 Chronic pulmonary embolism: Secondary | ICD-10-CM | POA: Diagnosis not present

## 2023-12-23 DIAGNOSIS — Z7901 Long term (current) use of anticoagulants: Secondary | ICD-10-CM | POA: Insufficient documentation

## 2023-12-23 DIAGNOSIS — Z86718 Personal history of other venous thrombosis and embolism: Secondary | ICD-10-CM | POA: Insufficient documentation

## 2023-12-23 DIAGNOSIS — Z86711 Personal history of pulmonary embolism: Secondary | ICD-10-CM | POA: Insufficient documentation

## 2023-12-23 DIAGNOSIS — I824Y2 Acute embolism and thrombosis of unspecified deep veins of left proximal lower extremity: Secondary | ICD-10-CM

## 2023-12-23 LAB — CBC WITH DIFFERENTIAL (CANCER CENTER ONLY)
Abs Immature Granulocytes: 0.01 K/uL (ref 0.00–0.07)
Basophils Absolute: 0.1 K/uL (ref 0.0–0.1)
Basophils Relative: 1 %
Eosinophils Absolute: 0.2 K/uL (ref 0.0–0.5)
Eosinophils Relative: 4 %
HCT: 35 % — ABNORMAL LOW (ref 39.0–52.0)
Hemoglobin: 12.3 g/dL — ABNORMAL LOW (ref 13.0–17.0)
Immature Granulocytes: 0 %
Lymphocytes Relative: 24 %
Lymphs Abs: 1.4 K/uL (ref 0.7–4.0)
MCH: 34.6 pg — ABNORMAL HIGH (ref 26.0–34.0)
MCHC: 35.1 g/dL (ref 30.0–36.0)
MCV: 98.3 fL (ref 80.0–100.0)
Monocytes Absolute: 0.4 K/uL (ref 0.1–1.0)
Monocytes Relative: 7 %
Neutro Abs: 3.6 K/uL (ref 1.7–7.7)
Neutrophils Relative %: 64 %
Platelet Count: 216 K/uL (ref 150–400)
RBC: 3.56 MIL/uL — ABNORMAL LOW (ref 4.22–5.81)
RDW: 13.9 % (ref 11.5–15.5)
WBC Count: 5.6 K/uL (ref 4.0–10.5)
nRBC: 0 % (ref 0.0–0.2)

## 2023-12-23 LAB — CMP (CANCER CENTER ONLY)
ALT: 9 U/L (ref 0–44)
AST: 25 U/L (ref 15–41)
Albumin: 4.5 g/dL (ref 3.5–5.0)
Alkaline Phosphatase: 45 U/L (ref 38–126)
Anion gap: 14 (ref 5–15)
BUN: 15 mg/dL (ref 8–23)
CO2: 28 mmol/L (ref 22–32)
Calcium: 8.9 mg/dL (ref 8.9–10.3)
Chloride: 101 mmol/L (ref 98–111)
Creatinine: 1.26 mg/dL — ABNORMAL HIGH (ref 0.61–1.24)
GFR, Estimated: >60 mL/min (ref >60)
Glucose, Bld: 92 mg/dL (ref 70–99)
Potassium: 3 mmol/L — ABNORMAL LOW (ref 3.5–5.1)
Sodium: 142 mmol/L (ref 135–145)
Total Bilirubin: 0.9 mg/dL (ref 0.0–1.2)
Total Protein: 7.1 g/dL (ref 6.5–8.1)

## 2023-12-23 LAB — IRON AND IRON BINDING CAPACITY (CC-WL,HP ONLY)
Iron: 48 ug/dL (ref 45–182)
Saturation Ratios: 13 % — ABNORMAL LOW (ref 17.9–39.5)
TIBC: 358 ug/dL (ref 250–450)
UIBC: 310 ug/dL

## 2023-12-23 LAB — FERRITIN: Ferritin: 136 ng/mL (ref 24–336)

## 2023-12-23 LAB — RETICULOCYTES
Immature Retic Fract: 10.8 % (ref 2.3–15.9)
RBC.: 3.49 MIL/uL — ABNORMAL LOW (ref 4.22–5.81)
Retic Count, Absolute: 47.5 K/uL (ref 19.0–186.0)
Retic Ct Pct: 1.4 % (ref 0.4–3.1)

## 2023-12-23 NOTE — Progress Notes (Signed)
 Hematology and Oncology Follow Up Visit  Drew [name] [id number] 02-28-1960 63 y.o. 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 is here today for follow-up. He is doing well but did lose his sweet dad 2 months ago. He had been in hospice for 2 years. His mother is living with him now.  He has not had any recent blood loss. Rare blood in the stool.  No bruising or petechiae.  No fever, chills, n/v, cough, rash, dizziness, SOB, chest pain, palpitations, abdominal pain or changes in bowel or bladder habits.  No swelling, tenderness, numbness or tingling in his extremities.  No falls or syncope.  Appetite and hydration are fair. Weight is stable at 256 lbs.   ECOG Performance Status: 0 - Asymptomatic  Medications:  Allergies as of 12/23/2023       Reactions   Tetracyclines & Related Hives, Itching        Medication List        Accurate as of December 23, 2023  8:55 AM. If you have any questions, ask your nurse or doctor.          albuterol  108 (90 Base) MCG/ACT inhaler Commonly known as: VENTOLIN  HFA Inhale 2 puffs into the lungs every 6 (six) hours as needed for wheezing or shortness of breath.   allopurinol  100 MG tablet Commonly known as: ZYLOPRIM  Take 1 tablet (100 mg total) by mouth daily. Needs appt   carvedilol  12.5 MG tablet Commonly known as: COREG  Take 1.5 tablets (18.75 mg total) by mouth 2 (two) times daily with a meal. *NEED FOLLOW UP APPOINTMENT FOR MORE REFILLS.*   digoxin  0.125 MG tablet Commonly known as: LANOXIN  Take 1 tablet (0.125 mg total) by mouth daily. PLEASE SCHEDULE APPOINTMENT FOR MORE REFILLS [phone] OPTION 2   Eliquis  5 MG Tabs tablet Generic drug:  apixaban  Take 1 tablet (5 mg total) by mouth 2 (two) times daily. Needs Cardiology appt for Eliquis  Refills, please call office to schedule.  Thank you   Entresto  97-103 MG Generic drug: sacubitril -valsartan  Take 1 tablet by mouth 2 (two) times daily.   Farxiga  10 MG Tabs tablet Generic drug: dapagliflozin  propanediol Take 1 tablet (10 mg total) by mouth daily. PLEASE SCHEDULE APPOINTMENT FOR MORE REFILLS [phone] OPTION 2   levothyroxine  25 MCG tablet Commonly known as: SYNTHROID  Take 1 tablet (25 mcg total) by mouth daily before breakfast. *Need appointment for future refills.*   melatonin 3 MG Tabs tablet Take 1 tablet (3 mg total) by mouth at bedtime. What changed: additional instructions   potassium chloride  SA 20 MEQ tablet Commonly known as: KLOR-CON  M Take 2 tablets (40 mEq total) by mouth daily.   rosuvastatin  20 MG tablet Commonly known as: Crestor  Take 1 tablet (20 mg total) by mouth daily.   spironolactone  25 MG tablet Commonly known as: ALDACTONE  Take 1 tablet (25 mg total) by mouth daily.   torsemide  20 MG tablet Commonly known as: DEMADEX  Take 4 tablets (80 mg total) by mouth daily. *Need appointment.*   Wegovy  2.4 MG/0.75ML Soaj SQ injection Generic drug: semaglutide -weight management Inject 2.4 mg into the skin once a week.  Allergies:  Allergies  Allergen Reactions   Tetracyclines & Related Hives and Itching    Past Medical History, Surgical history, Social history, and Family History were reviewed and updated.  Review of Systems: All other 10 point review of systems is negative.   Physical Exam:  weight is 256 lb 1.3 oz (116.2 kg). His oral temperature is 97.9 F (36.6 C). His blood pressure is 132/74 and his pulse is 63. His respiration is 18 and oxygen  saturation is 96%.   Wt Readings from Last 3 Encounters:  12/23/23 256 lb 1.3 oz (116.2 kg)  06/24/23 269 lb 0.6 oz (122 kg)  04/15/23 276 lb (125.2 kg)    Ocular: Sclerae  unicteric, pupils equal, round and reactive to light Ear-nose-throat: Oropharynx clear, dentition fair Lymphatic: No cervical or supraclavicular adenopathy Lungs no rales or rhonchi, good excursion bilaterally Heart regular rate and rhythm, no murmur appreciated Abd soft, nontender, positive bowel sounds MSK no focal spinal tenderness, no joint edema Neuro: non-focal, well-oriented, appropriate affect Breasts: Deferred   Lab Results  Component Value Date   WBC 5.6 12/23/2023   HGB 12.3 (L) 12/23/2023   HCT 35.0 (L) 12/23/2023   MCV 98.3 12/23/2023   PLT 216 12/23/2023   Lab Results  Component Value Date   FERRITIN 231 06/24/2023   IRON 89 06/24/2023   TIBC 336 06/24/2023   UIBC 247 06/24/2023   IRONPCTSAT 27 06/24/2023   Lab Results  Component Value Date   RETICCTPCT 1.4 12/23/2023   RBC 3.49 (L) 12/23/2023   RBC 3.56 (L) 12/23/2023   No results found for: KPAFRELGTCHN, LAMBDASER, KAPLAMBRATIO No results found for: IGGSERUM, IGA, IGMSERUM No results found for: STEPHANY CARLOTA BENSON MARKEL EARLA JOANNIE DOC VICK, SPEI   Chemistry      Component Value Date/Time   NA 140 06/24/2023 0909   NA 146 (H) 02/05/2020 1114   K 3.7 06/24/2023 0909   CL 104 06/24/2023 0909   CO2 26 06/24/2023 0909   BUN 21 (H) 06/24/2023 0909   BUN 16 02/05/2020 1114   CREATININE 1.22 06/24/2023 0909   CREATININE 1.26 11/13/2019 1454      Component Value Date/Time   CALCIUM  9.4 06/24/2023 0909   ALKPHOS 43 06/24/2023 0909   AST 15 06/24/2023 0909   ALT 12 06/24/2023 0909   BILITOT 0.9 06/24/2023 0909       Impression and Plan: [name] is a very pleasant 63 yo African American gentleman with history of right lower and middle lobe PEs with no evidence of right heart strain and left lower extremity DVT involving the left peroneal veins with atrial flutter (TEE and cardioversion on 03/07/20). DVT and PE have resolved on follow-up scans in 2022.    He is on lifelong anticoagulation with Eliquis . No changes at this time.  Iron studies are pending. We will replace if needed.  Follow-up in 6 months.   Lauraine Pepper, NP 12/8/20258:55 AM

## 2023-12-31 ENCOUNTER — Inpatient Hospital Stay

## 2023-12-31 VITALS — BP 147/89 | HR 55 | Temp 98.0°F | Resp 18 | Wt 266.0 lb

## 2023-12-31 DIAGNOSIS — D509 Iron deficiency anemia, unspecified: Secondary | ICD-10-CM

## 2023-12-31 MED ORDER — SODIUM CHLORIDE 0.9 % IV SOLN
510.0000 mg | Freq: Once | INTRAVENOUS | Status: AC
  Administered 2023-12-31: 09:00:00 510 mg via INTRAVENOUS
  Filled 2023-12-31 (×2): qty 17

## 2023-12-31 MED ORDER — SODIUM CHLORIDE 0.9 % IV SOLN: Freq: Once | INTRAVENOUS | Status: AC

## 2023-12-31 NOTE — Patient Instructions (Signed)

## 2024-01-01 ENCOUNTER — Other Ambulatory Visit: Payer: Self-pay

## 2024-01-01 ENCOUNTER — Encounter (HOSPITAL_BASED_OUTPATIENT_CLINIC_OR_DEPARTMENT_OTHER): Payer: Self-pay

## 2024-01-01 ENCOUNTER — Other Ambulatory Visit: Payer: Self-pay | Admitting: Medical

## 2024-01-01 ENCOUNTER — Other Ambulatory Visit: Payer: Self-pay | Admitting: Cardiology

## 2024-01-01 ENCOUNTER — Other Ambulatory Visit: Payer: Self-pay | Admitting: Internal Medicine

## 2024-01-01 ENCOUNTER — Other Ambulatory Visit (HOSPITAL_BASED_OUTPATIENT_CLINIC_OR_DEPARTMENT_OTHER): Payer: Self-pay

## 2024-01-01 ENCOUNTER — Telehealth: Payer: Self-pay

## 2024-01-01 DIAGNOSIS — I824Y2 Acute embolism and thrombosis of unspecified deep veins of left proximal lower extremity: Secondary | ICD-10-CM

## 2024-01-01 DIAGNOSIS — I2699 Other pulmonary embolism without acute cor pulmonale: Secondary | ICD-10-CM

## 2024-01-01 MED ORDER — APIXABAN 5 MG PO TABS
5.0000 mg | ORAL_TABLET | Freq: Two times a day (BID) | ORAL | 0 refills | Status: AC
  Filled 2024-01-01: qty 60, 30d supply, fill #0

## 2024-01-01 MED ORDER — SACUBITRIL-VALSARTAN 97-103 MG PO TABS
1.0000 | ORAL_TABLET | Freq: Two times a day (BID) | ORAL | 0 refills | Status: AC
  Filled 2024-01-01: qty 60, 30d supply, fill #0

## 2024-01-01 NOTE — Telephone Encounter (Signed)
 Called pt and notified him that he could not receive further refills until he schedules an appointment. Appointment made for 12/23

## 2024-01-07 ENCOUNTER — Inpatient Hospital Stay

## 2024-01-07 ENCOUNTER — Ambulatory Visit: Admitting: Medical

## 2024-01-07 ENCOUNTER — Other Ambulatory Visit (HOSPITAL_BASED_OUTPATIENT_CLINIC_OR_DEPARTMENT_OTHER): Payer: Self-pay

## 2024-01-07 ENCOUNTER — Encounter: Payer: Self-pay | Admitting: Medical

## 2024-01-07 VITALS — BP 136/87 | HR 55 | Temp 98.4°F | Resp 19

## 2024-01-07 VITALS — BP 130/79 | HR 55 | Temp 97.9°F | Resp 18 | Ht 71.0 in | Wt 264.2 lb

## 2024-01-07 DIAGNOSIS — D509 Iron deficiency anemia, unspecified: Secondary | ICD-10-CM

## 2024-01-07 DIAGNOSIS — E079 Disorder of thyroid, unspecified: Secondary | ICD-10-CM | POA: Diagnosis not present

## 2024-01-07 DIAGNOSIS — I10 Essential (primary) hypertension: Secondary | ICD-10-CM

## 2024-01-07 DIAGNOSIS — E039 Hypothyroidism, unspecified: Secondary | ICD-10-CM | POA: Diagnosis not present

## 2024-01-07 DIAGNOSIS — E876 Hypokalemia: Secondary | ICD-10-CM

## 2024-01-07 DIAGNOSIS — R7989 Other specified abnormal findings of blood chemistry: Secondary | ICD-10-CM | POA: Diagnosis not present

## 2024-01-07 DIAGNOSIS — I5022 Chronic systolic (congestive) heart failure: Secondary | ICD-10-CM | POA: Diagnosis not present

## 2024-01-07 DIAGNOSIS — I2510 Atherosclerotic heart disease of native coronary artery without angina pectoris: Secondary | ICD-10-CM | POA: Diagnosis not present

## 2024-01-07 DIAGNOSIS — M1000 Idiopathic gout, unspecified site: Secondary | ICD-10-CM

## 2024-01-07 LAB — LIPID PANEL
Cholesterol: 153 mg/dL (ref 28–200)
HDL: 86.7 mg/dL
LDL Cholesterol: 49 mg/dL (ref 10–99)
NonHDL: 66.37
Total CHOL/HDL Ratio: 2
Triglycerides: 86 mg/dL (ref 10.0–149.0)
VLDL: 17.2 mg/dL (ref 0.0–40.0)

## 2024-01-07 LAB — COMPREHENSIVE METABOLIC PANEL WITH GFR
ALT: 7 U/L (ref 3–53)
AST: 12 U/L (ref 5–37)
Albumin: 4.2 g/dL (ref 3.5–5.2)
Alkaline Phosphatase: 35 U/L — ABNORMAL LOW (ref 39–117)
BUN: 7 mg/dL (ref 6–23)
CO2: 25 meq/L (ref 19–32)
Calcium: 8.7 mg/dL (ref 8.4–10.5)
Chloride: 105 meq/L (ref 96–112)
Creatinine, Ser: 0.66 mg/dL (ref 0.40–1.50)
GFR: 101.38 mL/min
Glucose, Bld: 74 mg/dL (ref 70–99)
Potassium: 3.3 meq/L — ABNORMAL LOW (ref 3.5–5.1)
Sodium: 140 meq/L (ref 135–145)
Total Bilirubin: 0.9 mg/dL (ref 0.2–1.2)
Total Protein: 6.6 g/dL (ref 6.0–8.3)

## 2024-01-07 LAB — T4, FREE: Free T4: 0.67 ng/dL (ref 0.60–1.60)

## 2024-01-07 LAB — TSH: TSH: 9.45 u[IU]/mL — ABNORMAL HIGH (ref 0.35–5.50)

## 2024-01-07 LAB — BRAIN NATRIURETIC PEPTIDE: Pro B Natriuretic peptide (BNP): 72 pg/mL (ref 1.0–100.0)

## 2024-01-07 MED ORDER — ALLOPURINOL 100 MG PO TABS
100.0000 mg | ORAL_TABLET | Freq: Every day | ORAL | 3 refills | Status: AC
  Filled 2024-01-07: qty 90, 90d supply, fill #0

## 2024-01-07 MED ORDER — SODIUM CHLORIDE 0.9 % IV SOLN
510.0000 mg | Freq: Once | INTRAVENOUS | Status: AC
  Administered 2024-01-07: 510 mg via INTRAVENOUS
  Filled 2024-01-07: qty 17

## 2024-01-07 MED ORDER — SODIUM CHLORIDE 0.9 % IV SOLN: Freq: Once | INTRAVENOUS | Status: AC

## 2024-01-07 NOTE — Patient Instructions (Signed)

## 2024-01-07 NOTE — Progress Notes (Signed)
 "  Subjective:    Patient ID: Drew [name], male    DOB: [birth year], 63 y.o.   MRN: [id number]  HPI Drew [name] is a 63 year old male who presents for medication refills and thyroid  function testing.  He is here for follow-up on hypothyroidism and gout medication refills. He takes levothyroxine  25 micrograms daily and has only two to three pills left. His last thyroid  function test was about a year ago.   For gout, he takes allopurinol , which has effectively prevented recent flares.  He recently received an iron infusion, but details of the indication are not available. Treated by hematologist.  He has heart failure and atrial fibrillation and has not been following with cardiology. He takes Entresto , spironolactone , digoxin , Coreg , Eliquis , and Crestor , which was recently restarted after a period off. He has no shortness of breath, chest pain, or leg swelling.  He has had significant weight loss over several years and is on Wegovy  for obesity. His weight was 256 pounds two weeks ago.  His blood pressure has recently been around 130/79, with a prior reading of 136 systolic. He takes his blood pressure medication regularly.    Review of Systems  Constitutional:  Negative for chills, fatigue and fever.  HENT:  Negative for congestion, ear pain, hearing loss, postnasal drip, sneezing and sore throat.   Respiratory:  Negative for chest tightness, shortness of breath and wheezing.   Cardiovascular:  Negative for chest pain and palpitations.  Gastrointestinal:  Negative for abdominal pain, constipation and diarrhea.  Genitourinary:  Negative for dysuria, genital sores and penile swelling.  Neurological:  Negative for dizziness, weakness and light-headedness.  Hematological:  Negative for adenopathy. Does not bruise/bleed easily.   Past Medical History:  Diagnosis Date   Acute CHF (congestive heart failure) (HCC) 05/24/2018   Acute on chronic combined systolic and diastolic CHF  (congestive heart failure) (HCC)    Acute systolic HF (heart failure) (HCC) 06/02/2018   AKI (acute kidney injury) 03/03/2020   Alcohol use 05/24/2018   Allergy    Chronic systolic CHF (congestive heart failure) (HCC) 03/03/2020   Dilated cardiomyopathy (HCC)    Essential hypertension 05/24/2018   HFrEF (heart failure with reduced ejection fraction) (HCC) 11/20/2019   HTN (hypertension)    Hyperlipidemia 05/24/2018   Hypokalemia 03/03/2020   IDA (iron deficiency anemia) 08/23/2020   Knee pain, left 02/11/2014   New onset of congestive heart failure (HCC) 05/23/2018   Nonischemic cardiomyopathy (HCC) 11/20/2019   Obesity, Class III, BMI 40-49.9 (morbid obesity) (HCC) 05/24/2018   Pulmonary embolism (HCC) 03/03/2020   Seasonal allergies 02/11/2014   Tobacco abuse 05/24/2018   Typical atrial flutter (HCC)    Wellness examination 02/26/2014     Social History   Socioeconomic History   Marital status: Married    Spouse name: Not on file   Number of children: Not on file   Years of education: Not on file   Highest education level: Not on file  Occupational History   Not on file  Tobacco Use   Smoking status: Former    Current packs/day: 0.00    Average packs/day: 1 pack/day for 38.3 years (38.3 ttl pk-yrs)    Types: Cigarettes    Start date: 54    Quit date: 05/23/2018    Years since quitting: 5.6   Smokeless tobacco: Never  Vaping Use   Vaping status: Never Used  Substance and Sexual Activity   Alcohol use: Yes    Alcohol/week: 0.0  standard drinks of alcohol    Comment: 6 pack a week spread out over weekend   Drug use: No   Sexual activity: Yes  Other Topics Concern   Not on file  Social History Narrative   [employer].   Married lives with wife, mother in law and daughter.  7 children.     Social Drivers of Health   Tobacco Use: Medium Risk (01/07/2024)   Patient History    Smoking Tobacco Use: Former    Smokeless Tobacco Use: Never    Passive Exposure: Not on file  Financial Resource  Strain: Low Risk (04/25/2023)   Overall Financial Resource Strain (CARDIA)    Difficulty of Paying Living Expenses: Not hard at all  Food Insecurity: No Food Insecurity (04/25/2023)   Hunger Vital Sign    Worried About Running Out of Food in the Last Year: Never true    Ran Out of Food in the Last Year: Never true  Transportation Needs: No Transportation Needs (04/25/2023)   PRAPARE - Administrator, Civil Service (Medical): No    Lack of Transportation (Non-Medical): No  Physical Activity: Inactive (04/25/2023)   Exercise Vital Sign    Days of Exercise per Week: 0 days    Minutes of Exercise per Session: 0 min  Stress: No Stress Concern Present (04/25/2023)   [employer] of Occupational Health - Occupational Stress Questionnaire    Feeling of Stress : Not at all  Social Connections: Moderately Isolated (04/25/2023)   Social Connection and Isolation Panel    Frequency of Communication with Friends and Family: More than three times a week    Frequency of Social Gatherings with Friends and Family: More than three times a week    Attends Religious Services: Never    Database Administrator or Organizations: No    Attends Banker Meetings: Never    Marital Status: Married  Catering Manager Violence: Not At Risk (04/25/2023)   Humiliation, Afraid, Rape, and Kick questionnaire    Fear of Current or Ex-Partner: No    Emotionally Abused: No    Physically Abused: No    Sexually Abused: No  Depression (PHQ2-9): Low Risk (12/31/2023)   Depression (PHQ2-9)    PHQ-2 Score: 0  Alcohol Screen: Low Risk (04/25/2023)   Alcohol Screen    Last Alcohol Screening Score (AUDIT): 0  Housing: Unknown (04/25/2023)   Housing Stability Vital Sign    Unable to Pay for Housing in the Last Year: No    Number of Times Moved in the Last Year: Not on file    Homeless in the Last Year: No  Utilities: Not At Risk (04/25/2023)   AHC Utilities    Threatened with loss of utilities: No   Health Literacy: Adequate Health Literacy (04/25/2023)   B1300 Health Literacy    Frequency of need for help with medical instructions: Never    Past Surgical History:  Procedure Laterality Date   CARDIOVERSION N/A 03/07/2020   Procedure: CARDIOVERSION;  Surgeon: Rolan Ezra RAMAN, MD;  Location: [facility] ENDOSCOPY;  Service: Cardiovascular;  Laterality: N/A;   KNEE ARTHROSCOPY  2009   RIGHT/LEFT HEART CATH AND CORONARY ANGIOGRAPHY N/A 05/26/2018   Procedure: RIGHT/LEFT HEART CATH AND CORONARY ANGIOGRAPHY;  Surgeon: Claudene Victory ORN, MD;  Location: [facility] INVASIVE CV LAB;  Service: Cardiovascular;  Laterality: N/A;   TEE WITHOUT CARDIOVERSION N/A 03/07/2020   Procedure: TRANSESOPHAGEAL ECHOCARDIOGRAM (TEE);  Surgeon: Rolan Ezra RAMAN, MD;  Location: [facility] ENDOSCOPY;  Service: Cardiovascular;  Laterality: N/A;   THROAT SURGERY  2005    Family History  Problem Relation Age of Onset   Hearing loss Mother    Cancer Father    Heart failure Other    Heart failure Paternal Grandfather     Allergies[1]  Medications Ordered Prior to Encounter[2]  BP 130/79   Pulse (!) 55   Temp 97.9 F (36.6 C) (Oral)   Resp 18   Ht 5' 11 (1.803 m)   Wt 264 lb 3.2 oz (119.8 kg)   SpO2 99%   BMI 36.85 kg/m        Objective:   Physical Exam  General Mental Status- Alert. General Appearance- Not in acute distress.   Skin General: Color- Normal Color. Moisture- Normal Moisture.  Neck Carotid Arteries- Normal color. Moisture- Normal Moisture. No carotid bruits. No JVD.  Chest and Lung Exam Auscultation: Breath Sounds:-CTA  Cardiovascular Auscultation:Rythm- RRR Murmurs & Other Heart Sounds:Auscultation of the heart reveals- No Murmurs.  Abdomen Inspection:-Inspeection Normal. Palpation/Percussion:Note:No mass. Palpation and Percussion of the abdomen reveal- Non Tender, Non Distended + BS, no rebound or guarding.    Neurologic Cranial Nerve exam:- CN III-XII intact(No nystagmus), symmetric  smile. Drift Test:- No drift. Romberg Exam:- Negative.  Heal to Toe Gait exam:-Normal. Finger to Nose:- Normal/Intact Strength:- 5/5 equal and symmetric strength both upper and lower extremities.    Legs - calf symmetric, no pdeal edema and negative homans signs.     Assessment & Plan:   Hypothyroidism Managed with levothyroxine . Last thyroid  function test was a year ago. - Ordered TSH and T4 tests to assess current thyroid  function. - Refill levothyroxine   after reviewing test results.  Idiopathic gout Gout is well-controlled with allopurinol . - Refilled allopurinol  with three refills.  Chronic systolic heart failure Managed with Entresto , spironolactone , digoxin , Coreg , and Eliquis . He is overdue for a follow-up with his cardiologist. - Encouraged follow-up with cardiologist for ongoing management.  Atrial fibrillation(RATE CONTROLLED) Managed with digoxin  and Eliquis . - Continue current medications for atrial fibrillation.  Essential hypertension Blood pressure improved with medication. Current reading is 130/79 mmHg. - Continue current antihypertensive regimen.  Obesity Managed with Wegovy . Significant weight loss noted. - Continue Wegovy  for weight management.  Hyperlipidemia Managed with Crestor . LDL was high two years ago. - Ordered lipid panel to assess current cholesterol levels. - Continue Crestor  for hyperlipidemia management.  Follow up date to be determined after lab review  Dallas Maxwell, PA-C     [1]  Allergies Allergen Reactions   Tetracyclines & Related Hives and Itching  [2]  Current Outpatient Medications on File Prior to Visit  Medication Sig Dispense Refill   albuterol  (VENTOLIN  HFA) 108 (90 Base) MCG/ACT inhaler Inhale 2 puffs into the lungs every 6 (six) hours as needed for wheezing or shortness of breath. 6.7 g 2   apixaban  (ELIQUIS ) 5 MG TABS tablet Take 1 tablet (5 mg total) by mouth 2 (two) times daily. Needs Cardiology appt for  Eliquis  refills.  Please call office to schedule.  Thank you. 60 tablet 0   carvedilol  (COREG ) 12.5 MG tablet Take 1.5 tablets (18.75 mg total) by mouth 2 (two) times daily with a meal. *NEED FOLLOW UP APPOINTMENT FOR MORE REFILLS.* 180 tablet 1   dapagliflozin  propanediol (FARXIGA ) 10 MG TABS tablet Take 1 tablet (10 mg total) by mouth daily. PLEASE SCHEDULE APPOINTMENT FOR MORE REFILLS [phone] OPTION 2 60 tablet 0   digoxin  (LANOXIN ) 0.125 MG tablet Take 1 tablet (0.125 mg total) by mouth  daily. PLEASE SCHEDULE APPOINTMENT FOR MORE REFILLS [phone] OPTION 2 90 tablet 0   levothyroxine  (SYNTHROID ) 25 MCG tablet Take 1 tablet (25 mcg total) by mouth daily before breakfast. *Need appointment for future refills.* 30 tablet 0   melatonin 3 MG TABS tablet Take 1 tablet (3 mg total) by mouth at bedtime. 90 tablet 3   potassium chloride  SA (KLOR-CON  M) 20 MEQ tablet Take 2 tablets (40 mEq total) by mouth daily. 60 tablet 1   rosuvastatin  (CRESTOR ) 20 MG tablet Take 1 tablet (20 mg total) by mouth daily. 90 tablet 3   sacubitril -valsartan  (ENTRESTO ) 97-103 MG Take 1 tablet by mouth 2 (two) times daily. PLEASE SCHEDULE APPOINTMENT FOR MORE REFILLS [phone] OPTION 2. 60 tablet 0   spironolactone  (ALDACTONE ) 25 MG tablet Take 1 tablet (25 mg total) by mouth daily. 90 tablet 1   torsemide  (DEMADEX ) 20 MG tablet Take 4 tablets (80 mg total) by mouth daily. *Need appointment.* 60 tablet 0   WEGOVY  2.4 MG/0.75ML SOAJ SQ injection Inject 2.4 mg into the skin once a week. 3 mL 5   No current facility-administered medications on file prior to visit.   "

## 2024-01-07 NOTE — Patient Instructions (Addendum)
 Hypothyroidism Managed with levothyroxine . Last thyroid  function test was a year ago. - Ordered TSH and T4 tests to assess current thyroid  function. - Refill levothyroxine   after reviewing test results.  Idiopathic gout Gout is well-controlled with allopurinol . - Refilled allopurinol  with three refills.  Chronic systolic heart failure(clinically stable) Managed with Entresto , spironolactone , digoxin , Coreg , and Eliquis . He is overdue for a follow-up with his cardiologist. - Encouraged follow-up with cardiologist for ongoing management.  Atrial fibrillation(RATE CONTROLLED) Managed with digoxin  and Eliquis . - Continue current medications for atrial fibrillation.  Essential hypertension Blood pressure improved with medication. Current reading is 130/79 mmHg. - Continue current antihypertensive regimen.  Obesity Managed with Wegovy . Significant weight loss noted. - Continue Wegovy  for weight management.  Hyperlipidemia Managed with Crestor . LDL was high two years ago. - Ordered lipid panel to assess current cholesterol levels. - Continue Crestor  for hyperlipidemia management.  Follow up date to be determined after lab review

## 2024-01-08 ENCOUNTER — Ambulatory Visit: Payer: Self-pay | Admitting: Medical

## 2024-01-08 ENCOUNTER — Other Ambulatory Visit (HOSPITAL_BASED_OUTPATIENT_CLINIC_OR_DEPARTMENT_OTHER): Payer: Self-pay

## 2024-01-08 MED ORDER — POTASSIUM CHLORIDE CRYS ER 20 MEQ PO TBCR
40.0000 meq | EXTENDED_RELEASE_TABLET | Freq: Every day | ORAL | 0 refills | Status: AC
  Filled 2024-01-08: qty 14, 7d supply, fill #0

## 2024-01-08 MED ORDER — LEVOTHYROXINE SODIUM 25 MCG PO TABS
25.0000 ug | ORAL_TABLET | Freq: Every day | ORAL | 3 refills | Status: AC
  Filled 2024-01-08: qty 90, 90d supply, fill #0

## 2024-01-08 NOTE — Addendum Note (Signed)
 Addended by: DORINA DALLAS HERO on: 01/08/2024 09:45 AM   Modules accepted: Orders

## 2024-02-05 ENCOUNTER — Other Ambulatory Visit (HOSPITAL_BASED_OUTPATIENT_CLINIC_OR_DEPARTMENT_OTHER): Payer: Self-pay

## 2024-03-11 ENCOUNTER — Other Ambulatory Visit

## 2024-04-29 ENCOUNTER — Ambulatory Visit

## 2024-06-22 ENCOUNTER — Inpatient Hospital Stay: Admitting: Family

## 2024-06-22 ENCOUNTER — Inpatient Hospital Stay: Attending: Hematology & Oncology
